# Patient Record
Sex: Male | Born: 1945 | Race: White | Hispanic: No | Marital: Single
Health system: Midwestern US, Community
[De-identification: ages and names within clinical notes are randomized; demographics above are authoritative.]

## PROBLEM LIST (undated history)

## (undated) DIAGNOSIS — Z139 Encounter for screening, unspecified: Secondary | ICD-10-CM

## (undated) DIAGNOSIS — N183 Chronic kidney disease, stage 3 unspecified: Secondary | ICD-10-CM

## (undated) DIAGNOSIS — I4891 Unspecified atrial fibrillation: Secondary | ICD-10-CM

## (undated) DIAGNOSIS — J45909 Unspecified asthma, uncomplicated: Secondary | ICD-10-CM

## (undated) DIAGNOSIS — I10 Essential (primary) hypertension: Secondary | ICD-10-CM

## (undated) DIAGNOSIS — D649 Anemia, unspecified: Secondary | ICD-10-CM

## (undated) DIAGNOSIS — I509 Heart failure, unspecified: Secondary | ICD-10-CM

## (undated) DIAGNOSIS — E119 Type 2 diabetes mellitus without complications: Secondary | ICD-10-CM

## (undated) DIAGNOSIS — G4730 Sleep apnea, unspecified: Secondary | ICD-10-CM

## (undated) HISTORY — PX: WOUND DEBRIDEMENT: SHX247

---

## 2010-10-26 ENCOUNTER — Encounter

## 2010-10-30 NOTE — Progress Notes (Signed)
I saw this patient on Friday, Feb 3    Pt c/o loose BMs for 2-3 days.  Some cramping, no n/v.  No blood in BMs .  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  Given negative CT, normal WBC and benign abdominal exam, I dont see surgical issues at this time.    To f/u with primary doctor

## 2014-06-16 NOTE — Telephone Encounter (Signed)
See dict at [facility]

## 2014-07-01 ENCOUNTER — Encounter: Attending: Neurological Surgery | Primary: Family Medicine

## 2014-07-06 LAB — BASIC METABOLIC PANEL
Anion Gap: 11 mEq/L (ref 7–13)
BUN: 16 mg/dL (ref 8–23)
CO2: 28 mEq/L (ref 22–29)
Calcium: 8.5 mg/dL — ABNORMAL LOW (ref 8.6–10.2)
Chloride: 101 mEq/L (ref 98–107)
Creatinine: 0.69 mg/dL — ABNORMAL LOW (ref 0.70–1.20)
GFR African American: >60 (ref >60)
GFR Non-African American: >60 (ref >60)
Glucose: 202 mg/dL — ABNORMAL HIGH (ref 74–109)
Potassium: 4.3 mEq/L (ref 3.5–5.1)
Sodium: 140 mEq/L (ref 132–144)

## 2014-07-12 LAB — HEMOGLOBIN A1C: Hemoglobin A1C: 8 % — ABNORMAL HIGH (ref 4.8–5.9)

## 2014-08-02 ENCOUNTER — Encounter: Payer: MEDICARE | Attending: Neurological Surgery | Primary: Family Medicine

## 2014-08-08 NOTE — Telephone Encounter (Signed)
Patient called office to find out when his next appointment was. Informed him we had received a notice on Televox he cx'd the 08-02-14 post op appt and we had l/m for him to call office to confirm he was cx'ing and to r/s the post op appt.  He cannot schedule an appointment at this time because he is going out of town this week until 08-29-14 and will be returning at that time for knee surgery.  He will contact our office when he is able to schedule an appointment.

## 2014-08-31 HISTORY — PX: JOINT REPLACEMENT: SHX530

## 2014-09-06 LAB — BASIC METABOLIC PANEL
Anion Gap: 16 mEq/L — ABNORMAL HIGH (ref 7–13)
BUN: 48 mg/dL — ABNORMAL HIGH (ref 8–23)
CO2: 26 mEq/L (ref 22–29)
Calcium: 8.2 mg/dL — ABNORMAL LOW (ref 8.6–10.2)
Chloride: 91 mEq/L — ABNORMAL LOW (ref 98–107)
Creatinine: 1.67 mg/dL — ABNORMAL HIGH (ref 0.70–1.20)
GFR African American: 49.7 — ABNORMAL LOW (ref >60)
GFR Non-African American: 41.1 — ABNORMAL LOW (ref >60)
Glucose: 66 mg/dL — ABNORMAL LOW (ref 74–109)
Potassium: 4.4 mEq/L (ref 3.5–5.1)
Sodium: 133 mEq/L (ref 132–144)

## 2014-09-08 LAB — COMPREHENSIVE METABOLIC PANEL
ALT: 135 U/L — ABNORMAL HIGH (ref 0–41)
AST: 43 U/L — ABNORMAL HIGH (ref 0–40)
Albumin: 2.9 g/dL — ABNORMAL LOW (ref 3.9–4.9)
Alkaline Phosphatase: 100 U/L (ref 35–104)
Anion Gap: 18 mEq/L — ABNORMAL HIGH (ref 7–13)
BUN: 57 mg/dL — ABNORMAL HIGH (ref 8–23)
CO2: 23 mEq/L (ref 22–29)
Calcium: 7.7 mg/dL — ABNORMAL LOW (ref 8.6–10.2)
Chloride: 86 mEq/L — ABNORMAL LOW (ref 98–107)
Creatinine: 1.57 mg/dL — ABNORMAL HIGH (ref 0.70–1.20)
GFR African American: 53.4 — ABNORMAL LOW (ref >60)
GFR Non-African American: 44.1 — ABNORMAL LOW (ref >60)
Globulin: 2.6 g/dL (ref 2.3–3.5)
Glucose: 210 mg/dL — ABNORMAL HIGH (ref 74–109)
Potassium: 4.4 mEq/L (ref 3.5–5.1)
Sodium: 127 mEq/L — ABNORMAL LOW (ref 132–144)
Total Bilirubin: 0.5 mg/dL (ref 0.0–1.2)
Total Protein: 5.5 g/dL — ABNORMAL LOW (ref 6.4–8.1)

## 2014-09-08 LAB — VITAMIN D 25 HYDROXY: Vit D, 25-Hydroxy: 28.8 ng/mL — ABNORMAL LOW (ref 30.0–100.0)

## 2014-09-08 LAB — LIPID PANEL
Cholesterol, Total: 91 mg/dL (ref 0–199)
HDL: 31 mg/dL — ABNORMAL LOW (ref 40–59)
LDL Calculated: 44 mg/dL (ref 0–129)
Triglycerides: 81 mg/dL (ref 0–200)

## 2014-09-08 LAB — CBC
Hematocrit: 31.5 % — ABNORMAL LOW (ref 42.0–52.0)
Hemoglobin: 10.2 g/dL — ABNORMAL LOW (ref 14.0–18.0)
MCH: 30.1 pg (ref 27.0–31.3)
MCHC: 32.5 % — ABNORMAL LOW (ref 33.0–37.0)
MCV: 92.6 fL (ref 80.0–100.0)
Platelets: 263 10*3/uL (ref 130–400)
RBC: 3.4 M/uL — ABNORMAL LOW (ref 4.70–6.10)
RDW: 14.5 % (ref 11.5–14.5)
WBC: 14.1 10*3/uL — ABNORMAL HIGH (ref 4.8–10.8)

## 2014-09-08 LAB — MAGNESIUM: Magnesium: 3 mg/dL — ABNORMAL HIGH (ref 1.7–2.3)

## 2014-09-08 LAB — TSH, HIGH SENSITIVE: TSH: 4.88 u[IU]/mL — ABNORMAL HIGH (ref 0.270–4.200)

## 2014-09-12 LAB — BASIC METABOLIC PANEL
Anion Gap: 11 mEq/L (ref 7–13)
BUN: 33 mg/dL — ABNORMAL HIGH (ref 8–23)
CO2: 26 mEq/L (ref 22–29)
Calcium: 7.9 mg/dL — ABNORMAL LOW (ref 8.6–10.2)
Chloride: 99 mEq/L (ref 98–107)
Creatinine: 1.1 mg/dL (ref 0.70–1.20)
GFR African American: >60 (ref >60)
GFR Non-African American: >60 (ref >60)
Glucose: 198 mg/dL — ABNORMAL HIGH (ref 74–109)
Potassium: 5.6 mEq/L — ABNORMAL HIGH (ref 3.5–5.1)
Sodium: 136 mEq/L (ref 132–144)

## 2014-09-12 LAB — CBC
Hematocrit: 31.8 % — ABNORMAL LOW (ref 42.0–52.0)
Hemoglobin: 10.2 g/dL — ABNORMAL LOW (ref 14.0–18.0)
MCH: 30.9 pg (ref 27.0–31.3)
MCHC: 32.1 % — ABNORMAL LOW (ref 33.0–37.0)
MCV: 96 fL (ref 80.0–100.0)
Platelets: 331 10*3/uL (ref 130–400)
RBC: 3.31 M/uL — ABNORMAL LOW (ref 4.70–6.10)
RDW: 14.5 % (ref 11.5–14.5)
WBC: 11.2 10*3/uL — ABNORMAL HIGH (ref 4.8–10.8)

## 2014-09-13 LAB — COMPREHENSIVE METABOLIC PANEL
ALT: 49 U/L — ABNORMAL HIGH (ref 0–41)
AST: 25 U/L (ref 0–40)
Albumin: 2.9 g/dL — ABNORMAL LOW (ref 3.9–4.9)
Alkaline Phosphatase: 79 U/L (ref 35–104)
Anion Gap: 9 mEq/L (ref 7–13)
BUN: 30 mg/dL — ABNORMAL HIGH (ref 8–23)
CO2: 26 mEq/L (ref 22–29)
Calcium: 7.9 mg/dL — ABNORMAL LOW (ref 8.6–10.2)
Chloride: 101 mEq/L (ref 98–107)
Creatinine: 1.19 mg/dL (ref 0.70–1.20)
GFR African American: >60 (ref >60)
GFR Non-African American: >60 (ref >60)
Globulin: 2.5 g/dL (ref 2.3–3.5)
Glucose: 265 mg/dL — ABNORMAL HIGH (ref 74–109)
Potassium: 5.7 mEq/L — ABNORMAL HIGH (ref 3.5–5.1)
Sodium: 136 mEq/L (ref 132–144)
Total Bilirubin: 0.4 mg/dL (ref 0.0–1.2)
Total Protein: 5.4 g/dL — ABNORMAL LOW (ref 6.4–8.1)

## 2014-09-19 LAB — BTNP: Pro-BNP: 1597 pg/mL

## 2014-10-06 ENCOUNTER — Encounter: Attending: Neurological Surgery | Primary: Family Medicine

## 2014-10-07 LAB — CBC WITH DIFFERENTIAL
Bands Relative: 1 %
Basophils %: 0 %
Basophils Absolute: 0 10*3/uL (ref 0.0–0.2)
Eosinophils %: 1 %
Eosinophils Absolute: 0.1 10*3/uL (ref 0.0–0.7)
Hematocrit: 29.8 % — ABNORMAL LOW (ref 42.0–52.0)
Hemoglobin: 9.4 g/dL — ABNORMAL LOW (ref 14.0–18.0)
Lymphocytes %: 17 %
Lymphocytes Absolute: 1.3 10*3/uL (ref 1.0–4.8)
MCH: 29.5 pg (ref 27.0–31.3)
MCHC: 31.7 % — ABNORMAL LOW (ref 33.0–37.0)
MCV: 93.1 fL (ref 80.0–100.0)
Monocytes %: 9 %
Monocytes Absolute: 0.7 10*3/uL (ref 0.2–0.8)
Myelocyte Percent: 3 %
Neutrophils %: 69 %
Neutrophils Absolute: 5.8 10*3/uL (ref 1.4–6.5)
PLATELET SLIDE REVIEW: ADEQUATE
Platelets: 191 10*3/uL (ref 130–400)
RBC: 3.2 M/uL — ABNORMAL LOW (ref 4.70–6.10)
RDW: 15.5 % — ABNORMAL HIGH (ref 11.5–14.5)
WBC: 7.9 10*3/uL (ref 4.8–10.8)

## 2014-10-07 LAB — COMPREHENSIVE METABOLIC PANEL
ALT: 16 U/L (ref 0–41)
AST: 18 U/L (ref 0–40)
Albumin: 3.3 g/dL — ABNORMAL LOW (ref 3.9–4.9)
Alkaline Phosphatase: 72 U/L (ref 35–104)
Anion Gap: 13 mEq/L (ref 7–13)
BUN: 30 mg/dL — ABNORMAL HIGH (ref 8–23)
CO2: 26 mEq/L (ref 22–29)
Calcium: 9 mg/dL (ref 8.6–10.2)
Chloride: 102 mEq/L (ref 98–107)
Creatinine: 1 mg/dL (ref 0.70–1.20)
GFR African American: >60 (ref >60)
GFR Non-African American: >60 (ref >60)
Globulin: 2.5 g/dL (ref 2.3–3.5)
Glucose: 240 mg/dL — ABNORMAL HIGH (ref 74–109)
Potassium: 5.1 mEq/L (ref 3.5–5.1)
Sodium: 141 mEq/L (ref 132–144)
Total Bilirubin: 0.3 mg/dL (ref 0.0–1.2)
Total Protein: 5.8 g/dL — ABNORMAL LOW (ref 6.4–8.1)

## 2014-10-15 LAB — TROPONIN: Troponin: 0.025 ng/mL (ref 0.000–0.010)

## 2014-10-15 LAB — CREATINE KINASE: Total CK: 128 U/L (ref 0–190)

## 2014-10-16 LAB — URINALYSIS
Bilirubin Urine: NEGATIVE
Blood, Urine: NEGATIVE
Glucose, Ur: NEGATIVE mg/dL
Ketones, Urine: NEGATIVE mg/dL
Leukocyte Esterase, Urine: NEGATIVE
Nitrite, Urine: NEGATIVE
Protein, UA: NEGATIVE mg/dL
Specific Gravity, UA: 1.012 (ref 1.005–1.030)
Urobilinogen, Urine: 0.2 E.U./dL (ref <2.0)
pH, UA: 5 (ref 5.0–9.0)

## 2014-11-08 ENCOUNTER — Encounter (HOSPITAL_COMMUNITY): Payer: Self-pay | Admitting: Emergency Medicine

## 2014-11-08 ENCOUNTER — Inpatient Hospital Stay (HOSPITAL_COMMUNITY)
Admission: EM | Admit: 2014-11-08 | Discharge: 2014-11-11 | DRG: 291 | Disposition: A | Discharge status: Home Health | Payer: Medicare Other | Attending: Internal Medicine | Admitting: Internal Medicine

## 2014-11-08 ENCOUNTER — Institutional Professional Consult (permissible substitution): Payer: Self-pay | Admitting: Internal Medicine

## 2014-11-08 ENCOUNTER — Emergency Department (HOSPITAL_COMMUNITY): Payer: Medicare Other

## 2014-11-08 DIAGNOSIS — R0602 Shortness of breath: Secondary | ICD-10-CM | POA: Diagnosis not present

## 2014-11-08 DIAGNOSIS — I4891 Unspecified atrial fibrillation: Secondary | ICD-10-CM | POA: Diagnosis present

## 2014-11-08 DIAGNOSIS — E119 Type 2 diabetes mellitus without complications: Secondary | ICD-10-CM | POA: Diagnosis present

## 2014-11-08 DIAGNOSIS — Z794 Long term (current) use of insulin: Secondary | ICD-10-CM

## 2014-11-08 DIAGNOSIS — Z6841 Body Mass Index (BMI) 40.0 and over, adult: Secondary | ICD-10-CM

## 2014-11-08 DIAGNOSIS — L8990 Pressure ulcer of unspecified site, unspecified stage: Secondary | ICD-10-CM | POA: Diagnosis present

## 2014-11-08 DIAGNOSIS — I5033 Acute on chronic diastolic (congestive) heart failure: Secondary | ICD-10-CM | POA: Diagnosis not present

## 2014-11-08 DIAGNOSIS — R0902 Hypoxemia: Secondary | ICD-10-CM | POA: Diagnosis present

## 2014-11-08 DIAGNOSIS — G4730 Sleep apnea, unspecified: Secondary | ICD-10-CM | POA: Diagnosis present

## 2014-11-08 DIAGNOSIS — E118 Type 2 diabetes mellitus with unspecified complications: Secondary | ICD-10-CM

## 2014-11-08 DIAGNOSIS — L89152 Pressure ulcer of sacral region, stage 2: Secondary | ICD-10-CM | POA: Diagnosis present

## 2014-11-08 DIAGNOSIS — Z955 Presence of coronary angioplasty implant and graft: Secondary | ICD-10-CM

## 2014-11-08 DIAGNOSIS — E1129 Type 2 diabetes mellitus with other diabetic kidney complication: Secondary | ICD-10-CM

## 2014-11-08 DIAGNOSIS — I10 Essential (primary) hypertension: Secondary | ICD-10-CM | POA: Diagnosis present

## 2014-11-08 DIAGNOSIS — I4892 Unspecified atrial flutter: Secondary | ICD-10-CM | POA: Diagnosis present

## 2014-11-08 DIAGNOSIS — E785 Hyperlipidemia, unspecified: Secondary | ICD-10-CM | POA: Diagnosis present

## 2014-11-08 DIAGNOSIS — J9601 Acute respiratory failure with hypoxia: Secondary | ICD-10-CM | POA: Diagnosis present

## 2014-11-08 DIAGNOSIS — D649 Anemia, unspecified: Secondary | ICD-10-CM | POA: Diagnosis present

## 2014-11-08 DIAGNOSIS — I2510 Atherosclerotic heart disease of native coronary artery without angina pectoris: Secondary | ICD-10-CM | POA: Diagnosis present

## 2014-11-08 DIAGNOSIS — J45909 Unspecified asthma, uncomplicated: Secondary | ICD-10-CM | POA: Diagnosis present

## 2014-11-08 HISTORY — DX: Unspecified atrial fibrillation: I48.91

## 2014-11-08 HISTORY — DX: Heart failure, unspecified: I50.9

## 2014-11-08 HISTORY — DX: Essential (primary) hypertension: I10

## 2014-11-08 HISTORY — DX: Sleep apnea, unspecified: G47.30

## 2014-11-08 HISTORY — DX: Unspecified asthma, uncomplicated: J45.909

## 2014-11-08 HISTORY — DX: Type 2 diabetes mellitus without complications: E11.9

## 2014-11-08 LAB — BASIC METABOLIC PANEL
Anion gap: 6 (ref 5–15)
BUN: 26 mg/dL — AB (ref 6–23)
CHLORIDE: 104 mmol/L (ref 96–112)
CO2: 34 mmol/L — AB (ref 19–32)
Calcium: 8.8 mg/dL (ref 8.4–10.5)
Creatinine, Ser: 1.21 mg/dL (ref 0.50–1.35)
GFR calc Af Amer: 69 mL/min — ABNORMAL LOW (ref >90)
GFR calc non Af Amer: 60 mL/min — ABNORMAL LOW (ref >90)
GLUCOSE: 130 mg/dL — AB (ref 70–99)
POTASSIUM: 4.4 mmol/L (ref 3.5–5.1)
Sodium: 144 mmol/L (ref 135–145)

## 2014-11-08 LAB — CBC
HCT: 32.6 % — ABNORMAL LOW (ref 39.0–52.0)
Hemoglobin: 9.7 g/dL — ABNORMAL LOW (ref 13.0–17.0)
MCH: 29 pg (ref 26.0–34.0)
MCHC: 29.8 g/dL — ABNORMAL LOW (ref 30.0–36.0)
MCV: 97.3 fL (ref 78.0–100.0)
PLATELETS: 155 10*3/uL (ref 150–400)
RBC: 3.35 MIL/uL — ABNORMAL LOW (ref 4.22–5.81)
RDW: 15 % (ref 11.5–15.5)
WBC: 7.6 10*3/uL (ref 4.0–10.5)

## 2014-11-08 LAB — BRAIN NATRIURETIC PEPTIDE: B NATRIURETIC PEPTIDE 5: 300.9 pg/mL — AB (ref 0.0–100.0)

## 2014-11-08 LAB — TROPONIN I: Troponin I: 0.03 ng/mL (ref <0.031)

## 2014-11-08 LAB — GLUCOSE, CAPILLARY
Glucose-Capillary: 117 mg/dL — ABNORMAL HIGH (ref 70–99)
Glucose-Capillary: 224 mg/dL — ABNORMAL HIGH (ref 70–99)

## 2014-11-08 LAB — I-STAT TROPONIN, ED: TROPONIN I, POC: 0.02 ng/mL (ref 0.00–0.08)

## 2014-11-08 MED ORDER — FUROSEMIDE 10 MG/ML IJ SOLN
40.0000 mg | Freq: Once | INTRAMUSCULAR | Status: AC
  Administered 2014-11-08: 40 mg via INTRAVENOUS
  Filled 2014-11-08: qty 4

## 2014-11-08 MED ORDER — INSULIN ASPART 100 UNIT/ML ~~LOC~~ SOLN
0.0000 [IU] | Freq: Three times a day (TID) | SUBCUTANEOUS | Status: DC
  Administered 2014-11-09: 2 [IU] via SUBCUTANEOUS
  Administered 2014-11-09: 7 [IU] via SUBCUTANEOUS

## 2014-11-08 MED ORDER — SODIUM CHLORIDE 0.9 % IJ SOLN
3.0000 mL | Freq: Two times a day (BID) | INTRAMUSCULAR | Status: DC
  Administered 2014-11-08 – 2014-11-10 (×5): 3 mL via INTRAVENOUS

## 2014-11-08 MED ORDER — INSULIN ASPART 100 UNIT/ML ~~LOC~~ SOLN
0.0000 [IU] | Freq: Every day | SUBCUTANEOUS | Status: DC
  Administered 2014-11-08: 2 [IU] via SUBCUTANEOUS

## 2014-11-08 MED ORDER — IOHEXOL 350 MG/ML SOLN
100.0000 mL | Freq: Once | INTRAVENOUS | Status: AC | PRN
  Administered 2014-11-08: 100 mL via INTRAVENOUS

## 2014-11-08 MED ORDER — RIVAROXABAN 20 MG PO TABS
20.0000 mg | ORAL_TABLET | Freq: Every day | ORAL | Status: DC
  Administered 2014-11-08 – 2014-11-10 (×3): 20 mg via ORAL
  Filled 2014-11-08 (×4): qty 1

## 2014-11-08 NOTE — Progress Notes (Addendum)
[id number] male c/o sob, wound check CM consulted by ED unit secretary about Cm consult to assist with home health services.  CM noted pt without any insurance listed in EPIC.  CM went to speak with pt but he was being seen by ED CNA (pt would not speak with CM at this time), then by ED RN for IV insertion.   1230 In between of CM attempting to see pt CM was called by Mayra ReelLecretia of [facility] stating Pt's niece, Mark Roberson [phone]) works for [facility] and request pt be qualified for oxygen Reports he came form [place] on Saturday, 11/05/14 to live with her, kelly and she noted decrease in sats on last pm.  CM shared with [place] that [facility] sats [facility] has been 94-95%. Tresa EndoKelly provided insurance information see below. CM discussed order for home health services  1240 Pt confirmed his medicare coverage and states he does not have pcp because he just got in [place] today.  Informed him of call from [facility] with information from his niece.  Gave Cm permission to have niece assist him with medical care, setting up home services, choice of home health agency and choice of new [facility] pcp.    1242 shared number for niece with ED RN Discussed niece request for Oxygen, encouraged ambulation to check for drop in saturation  1246 called ED registration to provide ID numbers offered by niece as mcr [id number] A & Susa SimmondsARP [id number] spoke with niece states will need a cv for f/u, Has a wound opened this morning Wants wound checked in hospital Reports in January 2016 in [place] potassium was increased in SNF and he was on Oxygen 4 L [place] in snf and during last [place] hospitalization "at night". Reports pt had swelling in all extremities after drive from [place] to  and pt on lasix. Feels pt needs a nebulizer Instructed Tresa EndoKelly how Cm would obtain a list of medicare providers via medicare.gov for pt and place list in a personal belonging bag for him & her Discussed need for pcp for home health orders and f/u care  Provided cm mobile to  niece for further questions

## 2014-11-08 NOTE — ED Notes (Signed)
PT to CT at this time.

## 2014-11-08 NOTE — ED Notes (Signed)
Attempted IV access, Unsuccessful.

## 2014-11-08 NOTE — ED Notes (Signed)
MD at bedside at this time.

## 2014-11-08 NOTE — ED Notes (Signed)
Bed: WA19 Expected date:  Expected time:  Means of arrival:  Comments: EMS 

## 2014-11-08 NOTE — Progress Notes (Signed)
MD made aware of 5 beat run of [place].

## 2014-11-08 NOTE — ED Notes (Signed)
Attempted to call report to 5E. Secretary states RN will return call.

## 2014-11-08 NOTE — ED Provider Notes (Signed)
CSN: [id number]     Arrival date & time 11/08/14  1115 History   First MD Initiated Contact with Patient 11/08/14 1119     Chief Complaint  Patient presents with  . Shortness of Breath  . Wound Check     (Consider location/radiation/quality/duration/timing/severity/associated sxs/prior Treatment) HPI Comments: Patient checked his O2 sat monitor last night while coughing and having difficulty breathing it read in the 70s. He began coughing and breathing more and improved to the 90s without oxygen. He reports a recent stay in the hospital where he was hypoxic but never qualified for home O2 therapy per the hospital reports. He has a home O2 sat monitor and does not currently have abduction at home. He wants option therapy for home use today. He denies chest pain, shortness of breath, hemoptysis, fever at this time. He was short of breath last night when his sat was low but after he coughed and his sat improved he was doing fine.  He also reports a buttock wound. He had a pressure sore appear during rehabilitation and has had it debrided by wound center. During an 8 hour car ride down here to his new residence he felt the ulcer pop open.  He recently was in [place] at [facility] for hyperkalemia, troponin issues, heart failure. I cannot find a formal discharge summary.  Patient is a 69 y.o. male presenting with shortness of breath and wound check. The history is provided by the patient.  Shortness of Breath Severity:  Mild Onset quality:  Gradual Timing:  Sporadic Progression:  Unchanged Chronicity:  Recurrent Context: not URI   Context comment:  While sleeping Relieved by: coughing and breathing. Exacerbated by: sleeping. Associated symptoms: no abdominal pain, no chest pain, no fever, no rash, no sore throat and no vomiting   Risk factors: obesity (morbid) and recent surgery (recent TKA 2 months ago)   Risk factors: no hx of PE/DVT   Wound Check Associated symptoms include shortness of  breath. Pertinent negatives include no chest pain and no abdominal pain.    Past Medical History  Diagnosis Date  . CHF (congestive heart failure)   . Atrial fibrillation   . Diabetes mellitus without complication   . Hypertension   . Sleep apnea   . Asthma    Past Surgical History  Procedure Laterality Date  . 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 Wound debridement Right    History reviewed. No pertinent family history. History  Substance Use Topics  . Smoking status: Never Smoker   . Smokeless tobacco: Never Used  . Alcohol Use: Yes     Comment: rare    Review of Systems  Constitutional: Negative for fever.  HENT: Negative for sore throat.   Respiratory: Positive for shortness of breath.   Cardiovascular: Negative for chest pain.  Gastrointestinal: Negative for vomiting and abdominal pain.  Skin: Negative for rash.  All other systems reviewed and are negative.     Allergies  Percocet and Penicillins  Home Medications   Prior to Admission medications   Not on File   BP 111/51 mmHg  Pulse 75  Temp(Src) 98.1 F (36.7 C) (Oral)  Resp 20  Ht 6' (1.829 m)  Wt 338 lb (153.316 kg)  BMI 45.83 kg/m2  SpO2 94% Physical Exam  Constitutional: He is oriented to person, place, and time. He appears well-developed and well-nourished. No distress.  HENT:  Head: Normocephalic and atraumatic.  Mouth/Throat: No oropharyngeal exudate.  Eyes:  EOM are normal. Pupils are equal, round, and reactive to light.  Neck: Normal range of motion. Neck supple.  Cardiovascular: Normal rate and regular rhythm.  Exam reveals no friction rub.   No murmur heard. Pulmonary/Chest: Effort normal and breath sounds normal. No respiratory distress. He has no wheezes. He has no rales.  Abdominal: Soft. He exhibits no distension. There is no tenderness. There is no rebound.  Genitourinary:     Musculoskeletal: Normal range of motion. He exhibits edema (Non-pitting, , bilateral, 3+).    Neurological: He is alert and oriented to person, place, and time.  Skin: He is not diaphoretic.  Nursing note and vitals reviewed.   ED Course  Procedures (including critical care time) Labs Review Labs Reviewed  CBC  BASIC METABOLIC PANEL  BRAIN NATRIURETIC PEPTIDE  I-STAT TROPOININ, ED    Imaging Review Dg Chest 2 View  11/08/2014   CLINICAL DATA:  Congestive heart failure, recent left knee replacement  EXAM: CHEST  2 VIEW  COMPARISON:  None.  FINDINGS: Cardiomegaly is noted. Central mild vascular congestion without pulmonary edema. No segmental infiltrate. There is elevation of the right hemidiaphragm with right basilar atelectasis. Osteopenia and mild degenerative changes thoracic spine.  IMPRESSION: Cardiomegaly. Central mild vascular congestion without pulmonary edema. Elevation of the right hemidiaphragm with right basilar atelectasis. No segmental infiltrate.   Electronically Signed   By: Natasha Mead M.D.   On: 11/08/2014 13:34   Ct Angio Chest Pe W/cm &/or Wo Cm  11/08/2014   CLINICAL DATA:  69 year old male with shortness of breath and recent left knee replacement. Query pulmonary embolus. Initial encounter.  EXAM: CT ANGIOGRAPHY CHEST WITH CONTRAST  TECHNIQUE: Multidetector CT imaging of the chest was performed using the standard protocol during bolus administration of intravenous contrast. Multiplanar CT image reconstructions and MIPs were obtained to evaluate the vascular anatomy.  CONTRAST:  OMNIPAQUE IOHEXOL 350 MG/ML SOLN  COMPARISON:  Chest radiographs 1302 hours today.  FINDINGS: Suboptimal contrast bolus timing in the pulmonary arterial tree. No pulmonary artery filling defect identified.  Cardiomegaly. No pericardial or pleural effusion. Calcified atherosclerosis of the aorta and its branches, including the coronary arteries. Subcentimeter mediastinal and hilar lymph nodes. Among the largest nodes are those at the prevascular station measuring up to 9 mm short axis. No  axillary lymphadenopathy. Negative thoracic inlet.  Atelectatic changes to the major airways bilaterally. Ground-glass opacity in the upper lobes with mosaic attenuation pattern. Less pronounced similar changes in both lower lobes. Mild superimposed lower lobe atelectasis. No consolidation.  No acute osseous abnormality identified.  Negative visualized liver, gallbladder, spleen and bowel in the upper abdomen.  Review of the MIP images confirms the above findings.  IMPRESSION: 1. Suboptimal pulmonary artery contrast bolus, but no evidence of acute pulmonary embolus. 2. Cardiomegaly. Calcified atherosclerosis of the aorta and coronary arteries. 3. Atelectasis and mosaic attenuation in both lungs. Consider small airway disease, air trapping, chronic vascular disease.   Electronically Signed   By: Odessa Fleming M.D.   On: 11/08/2014 14:25     EKG Interpretation   Date/Time:  Tuesday November 08 2014 12:20:14 EST Ventricular Rate:  62 PR Interval:    QRS Duration: 90 QT Interval:  431 QTC Calculation: 438 R Axis:   -29 Text Interpretation:  Atrial flutter Borderline left axis deviation No  prior for comparison Confirmed by Gwendolyn Grant  MD, Brezlyn Manrique ([id number]) on 11/08/2014  3:54:01 PM      MDM   Final diagnoses:  Shortness of  breath  Hypoxia    69 year old male here for hypoxia at night and a wound ulcer. Hypoxia: Was hypoxic Hx his sats last night. He is doing well here and not hypoxic. I suspect is likely because he is morbidly obese and has sleep apnea. He doesn't have a formal diagnosis of sleep apnea per his outside records. He said he is not qualified for oxygen in the past but has had issues with hypoxia. He recently was in the hospital for a knee replacement, will check a PE scan.  Wound ulcer: Recently debrided at an outside wound center. He felt it reopened after the car ride here. On appearance here is not infected in the ulcer pill appears well. Is located in the right buttock inside the gluteal  cleft. It is not perianal. Will refer him to wound care.  Hypoxic here, PE scan negative. Unable to obtain oxygen out of the ER. Admitted for his hypoxia.  Elwin Mocha, MD 11/08/14 [phone]

## 2014-11-08 NOTE — Progress Notes (Addendum)
1558 spoke with kelly about pt admission or d/c with paying co pay for oxygen at home Pt agrees to admission as observation Wants wound care referral.  Cm spoke with Mayra ReelLecretia and Baxter HireKristen about DME & home health services Orders entered in EPIC  1500 spoke with Dr Gwendolyn GrantWalden about pt need for home oxygen. [facility],  RA 87% at rest in bed. Spoke with [facility] DME staff, Mayra ReelLecretia   [phone] Tresa EndoKelly called to state she has obtained pt an appointment at [facility] with Dr Niel HummerElizabeth Collier on November 14 2014 at 4 pm

## 2014-11-08 NOTE — H&P (Signed)
History and Physical    Mark GoldsMichael Roberson ZOX:[id number] DOB: [birth year] DOA: 11/08/2014  Referring physician: Dr. Gwendolyn GrantWalden PCP: Judie BonusKollar, Elizabeth A, MD  Specialists: none  Chief Complaint: shortness of breath   HPI: Mark GoldsMichael Roberson is a 69 y.o. male has a past medical history significant for coronary artery disease status post stent placement,  Insulin-dependent diabetes mellitus , hypertension, hyperlipidemia, sleep apnea , presents to the emergency room with a chief complaint of hypoxia. [place]. [facility] hyper[facility]emia, elevated troponin, apparently was in A. Fib and patient tells me that he had a TEE and he was converted back to sinus rhythm. He was discharged to a rehabilitation facility, where he spent about couple weeks, and he tells me that he was not very happy there as they did not monitor him accurately , and he tells me that  He has gained several pounds during his stay and he became progressively short of breath. He was discharged from the rehabilitation facility on Saturday morning , and at that time he decided to drive to [place] and he stays here with his friends. Over the last couple days, patient has continued to feel dyspneic with exertion, he recently bought a pulse oximeter and he noticed that his oxygenation levels in the 70s. He currently denies chest pain, but had a little bit earlier, he denies any shortness of breath , denies any fever or chills. He denies any abdominal pain, nausea, vomiting or diarrhea. He also endorses a buttock wound which was healing well and he received one care in the rehabilitation facility, however he feels like it opened up during the drive down here.  In the emergency room,  Patient was found mildly anemic with a hemoglobin of 9.7, elevated BNP at 300, borderline renal function with a BUN of 26 and creatinine 1.21 , he was hypoxic on room air and needed  oxygen. He underwent a CT angiogram of the chest which showed no evidence of PE. Chest x-ray showed pulmonary vascular congestion and cardiomegaly. EKG shows a flutter. TRH was asked for admission for hypoxic respiratory failure.  Review of Systems:  As per history of present illness, otherwise 10 point review of systems negative  Past Medical History  Diagnosis Date  . CHF (congestive heart failure)   . Atrial fibrillation   . Diabetes mellitus without complication   . Hypertension   . Sleep apnea   . Asthma    Past Surgical History  Procedure Laterality Date  . 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 Wound debridement Right    Social History:  reports that he has never smoked. He has never used smokeless tobacco. He reports that he drinks alcohol. He reports that he does not use illicit drugs.  Allergies  Allergen Reactions  . Percocet [Oxycodone-Acetaminophen] Other (See Comments)    hallucination  . Penicillins Hives   History reviewed. No pertinent family history.  Prior to Admission medications   Medication Sig Start Date End Date Taking? Authorizing Provider  albuterol (PROVENTIL HFA;VENTOLIN HFA) 108 (90 BASE) MCG/ACT inhaler Inhale 2 puffs into the lungs every 6 (six) hours as needed for shortness of breath (shortness of breath).   Yes Historical Provider, MD  atorvastatin (LIPITOR) 20 MG tablet Take 20 mg by mouth daily.   Yes Historical Provider, MD  carvedilol (COREG) 6.25 MG tablet Take 6.25 mg by mouth 2 (two) times daily with a  meal.   Yes Historical Provider, MD  Cholecalciferol (VITAMIN D3) 2000 UNITS TABS Take 1 tablet by mouth daily.   Yes Historical Provider, MD  diclofenac (VOLTAREN) 75 MG EC tablet Take 75 mg by mouth 2 (two) times daily.   Yes Historical Provider, MD  ferrous sulfate 300 (60 FE) MG/5ML syrup Take 300 mg by mouth 3 (three) times daily.   Yes Historical Provider, MD  gabapentin (NEURONTIN) 300 MG capsule Take 300 mg by mouth 3 (three) times  daily.   Yes Historical Provider, MD  insulin glargine (LANTUS) 100 UNIT/ML injection Inject 66 Units into the skin at bedtime.    Yes Historical Provider, MD  insulin lispro (HUMALOG) 100 UNIT/ML KiwkPen Inject 10-15 Units into the skin 2 (two) times daily. Takes 10 units with lunch and 15 units with supper, then sliding scale at bedtime.   Yes Historical Provider, MD  magnesium oxide (MAG-OX) 400 MG tablet Take 400 mg by mouth at bedtime.   Yes Historical Provider, MD  NYSTATIN PO Take 5 mLs by mouth 4 (four) times daily. For 1 Week.   Yes Historical Provider, MD  omega-3 acid ethyl esters (LOVAZA) 1 G capsule Take 2 g by mouth at bedtime.   Yes Historical Provider, MD  rivaroxaban (XARELTO) 20 MG TABS tablet Take 20 mg by mouth daily with supper.   Yes Historical Provider, MD  sitaGLIPtin (JANUVIA) 100 MG tablet Take 100 mg by mouth daily.   Yes Historical Provider, MD  sodium chloride (OCEAN) 0.65 % SOLN nasal spray Place 2 sprays into both nostrils daily as needed for congestion (congestion).   Yes Historical Provider, MD  torsemide (DEMADEX) 20 MG tablet Take 20 mg by mouth daily.   Yes Historical Provider, MD  lisinopril (PRINIVIL,ZESTRIL) 10 MG tablet Take 10 mg by mouth daily.    Historical Provider, MD   Physical Exam: Filed Vitals:   11/08/14 1132 11/08/14 1159 11/08/14 1331 11/08/14 1614  BP: 111/51  134/88 132/53  Pulse: 75 63 64 63  Temp: 98.1 F (36.7 C)     TempSrc: Oral     Resp: Height: 6' (1.829 m)     Weight: 153.316 kg (338 lb)     SpO2: 94% 87% 100% 100%      General:   He is in no apparent distress, morbidly obese Caucasian male  Eyes: PERRL, EOMI, no scleral icterus  ENT: moist oropharynx  Neck:  No cervical lymphadenopathy  Cardiovascular:  Irregularly irregular, no murmurs rubs or gallops, 2+ peripheral pulses, 1-2+ lower extremity edema , could not appreciate JVD due to thick neck  Respiratory:  No wheezing, moves air well, bibasilar  crackles  Abdomen:  Obese, nontender , distant bowel sounds  Skin: no rashes  Musculoskeletal: normal bulk and tone, no joint swelling,  Left buttock wound with clean dressing, no purulent discharge  Psychiatric: normal mood and affect  Neurologic:  Grossly nonfocal   Labs on Admission:  Basic Metabolic Panel:  Recent Labs Lab 11/08/14 1236  NA 144  K 4.4  CL 104  CO2 34*  GLUCOSE 130*  BUN 26*  CREATININE 1.21  CALCIUM 8.8   CBC:  Recent Labs Lab 11/08/14 1236  WBC 7.6  HGB 9.7*  HCT 32.6*  MCV 97.3  PLT 155   BNP (last 3 results)  Recent Labs  11/08/14 1236  BNP 300.9*    Radiological Exams on Admission: Dg Chest 2 View  11/08/2014   CLINICAL DATA:  Congestive heart failure, recent left knee replacement  EXAM: CHEST  2 VIEW  COMPARISON:  None.  FINDINGS: Cardiomegaly is noted. Central mild vascular congestion without pulmonary edema. No segmental infiltrate. There is elevation of the right hemidiaphragm with right basilar atelectasis. Osteopenia and mild degenerative changes thoracic spine.  IMPRESSION: Cardiomegaly. Central mild vascular congestion without pulmonary edema. Elevation of the right hemidiaphragm with right basilar atelectasis. No segmental infiltrate.   Electronically Signed   By: Natasha Mead M.D.   On: 11/08/2014 13:34   Ct Angio Chest Pe W/cm &/or Wo Cm  11/08/2014   CLINICAL DATA:  69 year old male with shortness of breath and recent left knee replacement. Query pulmonary embolus. Initial encounter.  EXAM: CT ANGIOGRAPHY CHEST WITH CONTRAST  TECHNIQUE: Multidetector CT imaging of the chest was performed using the standard protocol during bolus administration of intravenous contrast. Multiplanar CT image reconstructions and MIPs were obtained to evaluate the vascular anatomy.  CONTRAST:  OMNIPAQUE IOHEXOL 350 MG/ML SOLN  COMPARISON:  Chest radiographs 1302 hours today.  FINDINGS: Suboptimal contrast bolus timing in the pulmonary arterial  tree. No pulmonary artery filling defect identified.  Cardiomegaly. No pericardial or pleural effusion. Calcified atherosclerosis of the aorta and its branches, including the coronary arteries. Subcentimeter mediastinal and hilar lymph nodes. Among the largest nodes are those at the prevascular station measuring up to 9 mm short axis. No axillary lymphadenopathy. Negative thoracic inlet.  Atelectatic changes to the major airways bilaterally. Ground-glass opacity in the upper lobes with mosaic attenuation pattern. Less pronounced similar changes in both lower lobes. Mild superimposed lower lobe atelectasis. No consolidation.  No acute osseous abnormality identified.  Negative visualized liver, gallbladder, spleen and bowel in the upper abdomen.  Review of the MIP images confirms the above findings.  IMPRESSION: 1. Suboptimal pulmonary artery contrast bolus, but no evidence of acute pulmonary embolus. 2. Cardiomegaly. Calcified atherosclerosis of the aorta and coronary arteries. 3. Atelectasis and mosaic attenuation in both lungs. Consider small airway disease, air trapping, chronic vascular disease.   Electronically Signed   By: Odessa Fleming M.D.   On: 11/08/2014 14:25    EKG: Independently reviewed. A flutter  Assessment/Plan Active Problems:   Shortness of breath   Hypoxia   Morbid obesity   Decubitus ulcer   HTN (hypertension)   Atrial flutter   DM (diabetes mellitus)    Acute hypoxic respiratory failure -  There may be a heart failure component with an elevated BNP and a chest x-ray shows pulmonary vascular congestion. Lasix 40 mg 1. -  Obtain a 2-D echo -  He is on anticoagulations so doubt PE  ? CHF -  As per his diagnosis list in [place], obtain a 2-D echo, he definitely looks fluid overloaded with lower extremity edema as well as basilar crackles - Lasix 40 mg x 1 - Strict I's and O's - Daily weights - Chest tightness with dyspnea,  We'll check troponins  Atrial flutter -  Patient  currently is in a flutter in the ED - Continue Xarelto - per patient recently cardioverted from A fib - obtain 2D echo - he has a history of type II block as well, he is not on any beta blockers or calcium channel blockers, his a flutter is rate controlled with heart rate of 60. Closely monitor on telemetry.  Hypertension -  Resume his lisinopril , Lasix  Insulin-dependent diabetes -   I'm seeing his most recent A1c in [place] for 7.6 on November 2015 improved  from 9.1 -  Continue Lantus and sliding scale insulin  Morbid obesity  Anemia - hb on presentation 9.7, similar to 10/07/14 when it was 9.4 - monitor   His medical record as per care everywhere is quite extensive , however on having hard time finding the most recent discharge summary    Diet: heart healthy/carb modified Fluids: none DVT Prophylaxis: Xarelto  Code Status: Full  Family Communication: d/w patient  Disposition Plan:  Admit to telemetry  Time spent: 81  Arli Bree M. Elvera Lennox, MD [facility] Pager [phone]  If 7PM-7AM, please contact night-coverage [email or web address] Password [facility] 11/08/2014, 4:35 PM

## 2014-11-08 NOTE — ED Notes (Signed)
Pt noted to have desaturation to 87% on RA, [place] 2L applied and pt returned to 95%

## 2014-11-08 NOTE — ED Notes (Signed)
Pt BIB [facility], recently drove from [place] where he was in a rehab facility after having a L knee replacement, and was dx with CHF after gaining 40lb of fluid. Pt states that he has had drops in his oxygen saturation at night, as low as 74% that will wake him, and feels ShOB upon exertion. Pt states that he has been told to sleep on a pillow at night, but is unable to do so. Pt also has a wound to his R buttock that had been healing well, but opened up again on his drive down from [place].

## 2014-11-09 DIAGNOSIS — Z955 Presence of coronary angioplasty implant and graft: Secondary | ICD-10-CM | POA: Diagnosis not present

## 2014-11-09 DIAGNOSIS — Z794 Long term (current) use of insulin: Secondary | ICD-10-CM | POA: Diagnosis not present

## 2014-11-09 DIAGNOSIS — G4730 Sleep apnea, unspecified: Secondary | ICD-10-CM | POA: Diagnosis present

## 2014-11-09 DIAGNOSIS — I5033 Acute on chronic diastolic (congestive) heart failure: Secondary | ICD-10-CM | POA: Diagnosis present

## 2014-11-09 DIAGNOSIS — R0602 Shortness of breath: Secondary | ICD-10-CM | POA: Diagnosis present

## 2014-11-09 DIAGNOSIS — I2510 Atherosclerotic heart disease of native coronary artery without angina pectoris: Secondary | ICD-10-CM | POA: Diagnosis present

## 2014-11-09 DIAGNOSIS — I10 Essential (primary) hypertension: Secondary | ICD-10-CM

## 2014-11-09 DIAGNOSIS — I4892 Unspecified atrial flutter: Secondary | ICD-10-CM

## 2014-11-09 DIAGNOSIS — J9601 Acute respiratory failure with hypoxia: Secondary | ICD-10-CM | POA: Diagnosis present

## 2014-11-09 DIAGNOSIS — I4891 Unspecified atrial fibrillation: Secondary | ICD-10-CM | POA: Diagnosis present

## 2014-11-09 DIAGNOSIS — L89152 Pressure ulcer of sacral region, stage 2: Secondary | ICD-10-CM | POA: Diagnosis present

## 2014-11-09 DIAGNOSIS — Z6841 Body Mass Index (BMI) 40.0 and over, adult: Secondary | ICD-10-CM | POA: Diagnosis not present

## 2014-11-09 DIAGNOSIS — E785 Hyperlipidemia, unspecified: Secondary | ICD-10-CM | POA: Diagnosis present

## 2014-11-09 DIAGNOSIS — J45909 Unspecified asthma, uncomplicated: Secondary | ICD-10-CM | POA: Diagnosis present

## 2014-11-09 DIAGNOSIS — E119 Type 2 diabetes mellitus without complications: Secondary | ICD-10-CM | POA: Diagnosis present

## 2014-11-09 DIAGNOSIS — E1069 Type 1 diabetes mellitus with other specified complication: Secondary | ICD-10-CM

## 2014-11-09 DIAGNOSIS — D649 Anemia, unspecified: Secondary | ICD-10-CM | POA: Diagnosis present

## 2014-11-09 LAB — CBC
HCT: 33.3 % — ABNORMAL LOW (ref 39.0–52.0)
Hemoglobin: 9.8 g/dL — ABNORMAL LOW (ref 13.0–17.0)
MCH: 28.7 pg (ref 26.0–34.0)
MCHC: 29.4 g/dL — ABNORMAL LOW (ref 30.0–36.0)
MCV: 97.4 fL (ref 78.0–100.0)
PLATELETS: 160 10*3/uL (ref 150–400)
RBC: 3.42 MIL/uL — ABNORMAL LOW (ref 4.22–5.81)
RDW: 14.8 % (ref 11.5–15.5)
WBC: 7.3 10*3/uL (ref 4.0–10.5)

## 2014-11-09 LAB — COMPREHENSIVE METABOLIC PANEL
ALBUMIN: 3.1 g/dL — AB (ref 3.5–5.2)
ALK PHOS: 57 U/L (ref 39–117)
ALT: 18 U/L (ref 0–53)
AST: 22 U/L (ref 0–37)
Anion gap: 10 (ref 5–15)
BILIRUBIN TOTAL: 0.8 mg/dL (ref 0.3–1.2)
BUN: 22 mg/dL (ref 6–23)
CHLORIDE: 99 mmol/L (ref 96–112)
CO2: 33 mmol/L — ABNORMAL HIGH (ref 19–32)
Calcium: 9.1 mg/dL (ref 8.4–10.5)
Creatinine, Ser: 1.1 mg/dL (ref 0.50–1.35)
GFR calc Af Amer: 78 mL/min — ABNORMAL LOW (ref >90)
GFR calc non Af Amer: 67 mL/min — ABNORMAL LOW (ref >90)
Glucose, Bld: 180 mg/dL — ABNORMAL HIGH (ref 70–99)
POTASSIUM: 4.9 mmol/L (ref 3.5–5.1)
Sodium: 142 mmol/L (ref 135–145)
TOTAL PROTEIN: 6.4 g/dL (ref 6.0–8.3)

## 2014-11-09 LAB — TROPONIN I
Troponin I: 0.03 ng/mL (ref <0.031)
Troponin I: 0.03 ng/mL (ref <0.031)

## 2014-11-09 LAB — MAGNESIUM: Magnesium: 1.9 mg/dL (ref 1.5–2.5)

## 2014-11-09 LAB — GLUCOSE, CAPILLARY
Glucose-Capillary: 161 mg/dL — ABNORMAL HIGH (ref 70–99)
Glucose-Capillary: 315 mg/dL — ABNORMAL HIGH (ref 70–99)
Glucose-Capillary: 285 mg/dL — ABNORMAL HIGH (ref 70–99)
Glucose-Capillary: 170 mg/dL — ABNORMAL HIGH (ref 70–99)

## 2014-11-09 MED ORDER — OMEGA-3-ACID ETHYL ESTERS 1 G PO CAPS
2.0000 g | ORAL_CAPSULE | Freq: Every day | ORAL | Status: DC
  Administered 2014-11-09 – 2014-11-10 (×2): 2 g via ORAL
  Filled 2014-11-09 (×3): qty 2

## 2014-11-09 MED ORDER — CARVEDILOL 6.25 MG PO TABS
6.2500 mg | ORAL_TABLET | Freq: Two times a day (BID) | ORAL | Status: DC
  Administered 2014-11-09 – 2014-11-11 (×4): 6.25 mg via ORAL
  Filled 2014-11-09 (×6): qty 1

## 2014-11-09 MED ORDER — INSULIN ASPART 100 UNIT/ML ~~LOC~~ SOLN
6.0000 [IU] | Freq: Three times a day (TID) | SUBCUTANEOUS | Status: DC
  Administered 2014-11-09 – 2014-11-10 (×3): 6 [IU] via SUBCUTANEOUS

## 2014-11-09 MED ORDER — FUROSEMIDE 10 MG/ML IJ SOLN
40.0000 mg | Freq: Two times a day (BID) | INTRAMUSCULAR | Status: DC
  Administered 2014-11-09 (×2): 40 mg via INTRAVENOUS
  Filled 2014-11-09 (×4): qty 4

## 2014-11-09 MED ORDER — INSULIN ASPART 100 UNIT/ML ~~LOC~~ SOLN: 0.0000 [IU] | Freq: Every day | SUBCUTANEOUS | Status: DC

## 2014-11-09 MED ORDER — INSULIN GLARGINE 100 UNIT/ML ~~LOC~~ SOLN
50.0000 [IU] | Freq: Every day | SUBCUTANEOUS | Status: DC
  Administered 2014-11-09 – 2014-11-10 (×2): 50 [IU] via SUBCUTANEOUS
  Filled 2014-11-09 (×2): qty 0.5

## 2014-11-09 MED ORDER — MAGNESIUM OXIDE 400 (241.3 MG) MG PO TABS
400.0000 mg | ORAL_TABLET | Freq: Every day | ORAL | Status: DC
  Administered 2014-11-09 – 2014-11-10 (×2): 400 mg via ORAL
  Filled 2014-11-09 (×3): qty 1

## 2014-11-09 MED ORDER — INSULIN ASPART 100 UNIT/ML ~~LOC~~ SOLN
0.0000 [IU] | Freq: Three times a day (TID) | SUBCUTANEOUS | Status: DC
  Administered 2014-11-09: 11 [IU] via SUBCUTANEOUS
  Administered 2014-11-10: 4 [IU] via SUBCUTANEOUS
  Administered 2014-11-10 (×2): 3 [IU] via SUBCUTANEOUS

## 2014-11-09 NOTE — Progress Notes (Signed)
UR completed 

## 2014-11-09 NOTE — Evaluation (Signed)
Physical Therapy Evaluation Patient Details Name: Mark Roberson MRN: [id number] DOB: 04/18/1946 Today's Date: 11/09/2014   History of Present Illness  [age] yo male admitted with shortness of breath. hx of L TKA 08/2014, CHF, A fib, DM, HTN, obesity  Clinical Impression  On eval, pt required Min guard assist for mobility-able to ambulate ~115 feet with RW. Pt fatigues fairly easily. O2 sats 95% on RA during ambulation, 88-89% on RA after getting back to bed (lying supine). VCs for pursed lip breathing. Reapplied [place] O2 2L and sats recovered to 98%.     Follow Up Recommendations Home health PT;Supervision - Intermittent    Equipment Recommendations  None recommended by PT    Recommendations for Other Services       Precautions / Restrictions Precautions Precautions: Fall Precaution Comments: monitor sats Restrictions Weight Bearing Restrictions: No      Mobility  Bed Mobility Overal bed mobility: Needs Assistance Bed Mobility: Sit to Supine       Sit to supine: Min guard   General bed mobility comments: close guard for safety  Transfers Overall transfer level: Needs assistance Equipment used: Rolling walker (2 wheeled) Transfers: Sit to/from Stand Sit to Stand: Min guard         General transfer comment: close guard for safety.   Ambulation/Gait Ambulation/Gait assistance: Min guard Ambulation Distance (Feet): 115 Feet Assistive device: Rolling walker (2 wheeled) Gait Pattern/deviations: Step-through pattern;Trunk flexed     General Gait Details: close guard and chair follow at pt's request. O2 sats 95% on RA during ambulation. Pt fatigues fairly easily.   Stairs            Wheelchair Mobility    Modified Rankin (Stroke Patients Only)       Balance Overall balance assessment: Needs assistance         Standing balance support: Bilateral upper extremity supported;During functional activity Standing balance-Leahy Scale: Poor                                Pertinent Vitals/Pain Pain Assessment: No/denies pain    Home Living Family/patient expects to be discharged to:: Unsure     Type of Home: House Home Access: Stairs to enter Entrance Stairs-Rails: Right Entrance Stairs-Number of Steps: 3 Home Layout: One level Home Equipment: Environmental consultantWalker - 2 wheels Additional Comments: pt not very forthcoming with PLOF/home environment info    Prior Function Level of Independence: Independent with assistive device(s)               Hand Dominance        Extremity/Trunk Assessment   Upper Extremity Assessment: Generalized weakness           Lower Extremity Assessment: Generalized weakness      Cervical / Trunk Assessment: Normal  Communication   Communication: No difficulties  Cognition Arousal/Alertness: Awake/alert Behavior During Therapy: WFL for tasks assessed/performed Overall Cognitive Status: Within Functional Limits for tasks assessed                      General Comments      Exercises        Assessment/Plan    PT Assessment Patient needs continued PT services  PT Diagnosis Difficulty walking;Generalized weakness   PT Problem List Decreased activity tolerance;Decreased balance;Decreased mobility;Obesity;Decreased strength  PT Treatment Interventions DME instruction;Gait training;Functional mobility training;Therapeutic activities;Therapeutic exercise;Patient/family education;Balance training   PT Goals (Current goals can be found  in the Care Plan section) Acute Rehab PT Goals Patient Stated Goal: none stated PT Goal Formulation: With patient Time For Goal Achievement: 11/23/14 Potential to Achieve Goals: Good    Frequency Min 3X/week   Barriers to discharge        Co-evaluation               End of Session   Activity Tolerance: Patient limited by fatigue Patient left: with call bell/phone within reach      Functional Assessment Tool Used: clinical  judgement Functional Limitation: Mobility: Walking and moving around Mobility: Walking and Moving Around Current Status (Z6109): At least 1 percent but less than 20 percent impaired, limited or restricted Mobility: Walking and Moving Around Goal Status [phone]): At least 1 percent but less than 20 percent impaired, limited or restricted    Time: 1340-1356 PT Time Calculation (min) (ACUTE ONLY): 16 min   Charges:   PT Evaluation $Initial PT Evaluation Tier I: 1 Procedure     PT G Codes:   PT G-Codes **NOT FOR INPATIENT CLASS** Functional Assessment Tool Used: clinical judgement Functional Limitation: Mobility: Walking and moving around Mobility: Walking and Moving Around Current Status (U9811): At least 1 percent but less than 20 percent impaired, limited or restricted Mobility: Walking and Moving Around Goal Status [phone]): At least 1 percent but less than 20 percent impaired, limited or restricted    Rebeca Alert, MPT Pager: [phone]

## 2014-11-09 NOTE — Progress Notes (Signed)
  Echocardiogram 2D Echocardiogram has been performed.  Gale Klar FRANCES 11/09/2014, 12:51 PM

## 2014-11-09 NOTE — Consult Note (Signed)
WOC wound consult note Reason for Consult: Linear, full thickness ulceration toward right of gluteal cleft:  Not a pressure ulcer. Wound type:Etiology not known.  Suspect moisture and friction in this area Pressure Ulcer POA: No Measurement:4cm x 0.6cm x 0.2cm Wound ZOX:WRUEbed:Pink, moist with evidence of healing (scarring) at periphery Drainage (amount, consistency, odor) scant serous to light yellow Periwound:intact, moist Dressing procedure/placement/frequency: I will provide a bariatric bed that is more appropriate for this gentleman's size as well as a pressure redistribution chair pad for his use when he is OOB in chair.  He is to be encouraged to lie on his side while in bed.  A soft silicone foam dressing is provided as a topical dressing for this area.  If it becomes too difficult to maintain in this area, I have instructed he nursing staff that our moisture barrier ointment, applied sparingly and frequently would suffice as a substitution for the topical silicone dressing. WOC nursing team will not follow, but will remain available to this patient, the nursing and medical team.  Please re-consult if needed. Thanks, Mark MowLaurie Zadiel Leyh, MSN, RN, GNP, [place], CWON-AP [phone])

## 2014-11-09 NOTE — Progress Notes (Signed)
[facility] PROGRESS NOTE   Assessment/Plan: Acute respiratory failure with hypoxia - CT Angio of the chest showed no PE or pulmonary edema. - His BNP was elevated at 300, 2-D echo pending.  - Has not been evaluated in the past for obstructive sleep apnea will need a sleep study as an outpatient.  A. fib rate controlled: - Resume his Coreg, he is currently rate controlled, continue Xarelto.  Question of indeterminate heart failure: - As per his diagnosis list on care everywhere, 2-D echo is pending. - He was started on IV Lasix. He had moderate urine output. - Strict I's and O's monitor electrolytes.  Essential hypertension: - Continue IV Lasix and ACE inhibitor.  Insulin-dependent diabetes mellitus. - His last hemoglobin A1c was 7.6. Continue Lantus plus sliding scale insulin.  Decubitus ulcer -  WOC   Code Status: full Family Communication: none  Disposition Plan: inpatient   Consultants:  none  Procedures:  echo  Antibiotics:  None  HPI/Subjective: He relates his shortness of breath is better, was His improve.  Objective: Filed Vitals:   11/08/14 1721 11/08/14 1851 11/08/14 2208 11/09/14 0540  BP: 123/49  127/53 120/43  Pulse: 53  57 58  Temp: 98.2 F (36.8 C)  98.9 F (37.2 C) 98.7 F (37.1 C)  TempSrc: Oral  Oral Oral  Resp: 20  20 18   Height:  6' (1.829 m)    Weight: 154.268 kg (340 lb 1.6 oz)     SpO2: 99%  99% 99%    Intake/Output Summary (Last 24 hours) at 11/09/14 1014 Last data filed at 11/09/14 0934  Gross per 24 hour  Intake    500 ml  Output   2420 ml  Net  -1920 ml   Filed Weights   11/08/14 1132 11/08/14 1721  Weight: 153.316 kg (338 lb) 154.268 kg (340 lb 1.6 oz)    Exam:  General: Alert, awake, oriented x3, in no acute distress.  HEENT: No bruits, no goiter.  Heart: Regular rate and rhythm. Lungs: Good air movement, clear Abdomen: Soft, nontender, nondistended, positive bowel sounds.    Data  Reviewed: Basic Metabolic Panel:  Recent Labs Lab 11/08/14 1236 11/09/14 0500 11/09/14 0505  NA 144  --  142  K 4.4  --  4.9  CL 104  --  99  CO2 34*  --  33*  GLUCOSE 130*  --  180*  BUN 26*  --  22  CREATININE 1.21  --  1.10  CALCIUM 8.8  --  9.1  MG  --  1.9  --    Liver Function Tests:  Recent Labs Lab 11/09/14 0505  AST 22  ALT 18  ALKPHOS 57  BILITOT 0.8  PROT 6.4  ALBUMIN 3.1*   No results for input(s): LIPASE, AMYLASE in the last 168 hours. No results for input(s): AMMONIA in the last 168 hours. CBC:  Recent Labs Lab 11/08/14 1236 11/09/14 0505  WBC 7.6 7.3  HGB 9.7* 9.8*  HCT 32.6* 33.3*  MCV 97.3 97.4  PLT 155 160   Cardiac Enzymes:  Recent Labs Lab 11/08/14 1906 11/08/14 2239 11/09/14 0505  TROPONINI 0.03 0.03 0.03   BNP (last 3 results)  Recent Labs  11/08/14 1236  BNP 300.9*    ProBNP (last 3 results) No results for input(s): PROBNP in the last 8760 hours.  CBG:  Recent Labs Lab 11/08/14 1737 11/08/14 2201 11/09/14 0733  GLUCAP 117* 224* 161*    No results found for this  or any previous visit (from the past 240 hour(s)).   Studies: Dg Chest 2 View  11/08/2014   CLINICAL DATA:  Congestive heart failure, recent left knee replacement  EXAM: CHEST  2 VIEW  COMPARISON:  None.  FINDINGS: Cardiomegaly is noted. Central mild vascular congestion without pulmonary edema. No segmental infiltrate. There is elevation of the right hemidiaphragm with right basilar atelectasis. Osteopenia and mild degenerative changes thoracic spine.  IMPRESSION: Cardiomegaly. Central mild vascular congestion without pulmonary edema. Elevation of the right hemidiaphragm with right basilar atelectasis. No segmental infiltrate.   Electronically Signed   By: Natasha Mead M.D.   On: 11/08/2014 13:34   Ct Angio Chest Pe W/cm &/or Wo Cm  11/08/2014   CLINICAL DATA:  68 year old male with shortness of breath and recent left knee replacement. Query pulmonary  embolus. Initial encounter.  EXAM: CT ANGIOGRAPHY CHEST WITH CONTRAST  TECHNIQUE: Multidetector CT imaging of the chest was performed using the standard protocol during bolus administration of intravenous contrast. Multiplanar CT image reconstructions and MIPs were obtained to evaluate the vascular anatomy.  CONTRAST:  OMNIPAQUE IOHEXOL 350 MG/ML SOLN  COMPARISON:  Chest radiographs 1302 hours today.  FINDINGS: Suboptimal contrast bolus timing in the pulmonary arterial tree. No pulmonary artery filling defect identified.  Cardiomegaly. No pericardial or pleural effusion. Calcified atherosclerosis of the aorta and its branches, including the coronary arteries. Subcentimeter mediastinal and hilar lymph nodes. Among the largest nodes are those at the prevascular station measuring up to 9 mm short axis. No axillary lymphadenopathy. Negative thoracic inlet.  Atelectatic changes to the major airways bilaterally. Ground-glass opacity in the upper lobes with mosaic attenuation pattern. Less pronounced similar changes in both lower lobes. Mild superimposed lower lobe atelectasis. No consolidation.  No acute osseous abnormality identified.  Negative visualized liver, gallbladder, spleen and bowel in the upper abdomen.  Review of the MIP images confirms the above findings.  IMPRESSION: 1. Suboptimal pulmonary artery contrast bolus, but no evidence of acute pulmonary embolus. 2. Cardiomegaly. Calcified atherosclerosis of the aorta and coronary arteries. 3. Atelectasis and mosaic attenuation in both lungs. Consider small airway disease, air trapping, chronic vascular disease.   Electronically Signed   By: Odessa Fleming M.D.   On: 11/08/2014 14:25    Scheduled Meds: . carvedilol  6.25 mg Oral BID WC  . furosemide  40 mg Intravenous Q12H  . insulin aspart  0-5 Units Subcutaneous QHS  . insulin aspart  0-9 Units Subcutaneous TID WC  . magnesium oxide  400 mg Oral QHS  . omega-3 acid ethyl esters  2 g Oral QHS  .  rivaroxaban  20 mg Oral Q supper  . sodium chloride  3 mL Intravenous Q12H   Continuous Infusions:    Marinda Elk  [facility] Pager [phone]. If 8PM-8AM, please contact night-coverage at [email or web address], password [facility] 11/09/2014, 10:14 AM

## 2014-11-09 NOTE — Progress Notes (Signed)
CARE MANAGEMENT NOTE 11/09/2014  Patient:  Mark Roberson,Mark Roberson   Account Number:  [id number]  Date Initiated:  11/09/2014  Documentation initiated by:  Ferdinand CavaSCHETTINO,Zyana Amaro  Subjective/Objective Assessment:   69 yo male admitted with shortness of breath     Action/Plan:   dsicharge planning   Anticipated DC Date:  11/10/2014   Anticipated DC Plan:  HOME W HOME HEALTH SERVICES      DC Planning Services  CM consult      Choice offered to / List presented to:        DME agency  [facility]        [facility]   Status of service:  In process, will continue to follow Medicare Important Message given?   (If response is "NO", the following Medicare IM given date fields will be blank) Date Medicare IM given:   Medicare IM given by:   Date Additional Medicare IM given:   Additional Medicare IM given by:    Discharge Disposition:    Per UR Regulation:    If discussed at Long Length of Stay Meetings, dates discussed:    Comments:  11/09/14 Ferdinand CavaAndrea Schettino RN [facility] CM [phone] Patient stated that he moved ot [place] on Saturday because he does not have any support in [place]. He stated that he has been in and out of the hospital and SNF's in [place] for the past 3-4 months. He is staying with a friend and plans on discharging and [place]. Spoke with Tresa EndoKelly, patient friend, and she stated that the patient has been SOB and needing a lot of assist in the home. [facility], Tresa EndoKelly also discussed the need for RN, SW, aide upon discharge. Kelly requested a hosptial bed and a 3n1 BSC and neb machine has already been ordered. Will continue to follow if patient qualifies for home O2.

## 2014-11-09 NOTE — Progress Notes (Signed)
ED CM received follow up call from niece CM followed up with her on medication verification and review of orders entered for preparation for d/c when decided by attending.   Updated Unit CM

## 2014-11-10 ENCOUNTER — Encounter (HOSPITAL_COMMUNITY): Payer: Self-pay

## 2014-11-10 DIAGNOSIS — I5031 Acute diastolic (congestive) heart failure: Secondary | ICD-10-CM

## 2014-11-10 LAB — BASIC METABOLIC PANEL
Anion gap: 7 (ref 5–15)
BUN: 19 mg/dL (ref 6–23)
CHLORIDE: 95 mmol/L — AB (ref 96–112)
CO2: 37 mmol/L — AB (ref 19–32)
Calcium: 8.7 mg/dL (ref 8.4–10.5)
Creatinine, Ser: 1.16 mg/dL (ref 0.50–1.35)
GFR calc Af Amer: 73 mL/min — ABNORMAL LOW (ref >90)
GFR calc non Af Amer: 63 mL/min — ABNORMAL LOW (ref >90)
GLUCOSE: 132 mg/dL — AB (ref 70–99)
Potassium: 4 mmol/L (ref 3.5–5.1)
SODIUM: 139 mmol/L (ref 135–145)

## 2014-11-10 LAB — GLUCOSE, CAPILLARY
GLUCOSE-CAPILLARY: 127 mg/dL — AB (ref 70–99)
GLUCOSE-CAPILLARY: 129 mg/dL — AB (ref 70–99)
GLUCOSE-CAPILLARY: 184 mg/dL — AB (ref 70–99)
Glucose-Capillary: 153 mg/dL — ABNORMAL HIGH (ref 70–99)

## 2014-11-10 MED ORDER — TORSEMIDE 20 MG PO TABS
20.0000 mg | ORAL_TABLET | Freq: Every day | ORAL | Status: DC
  Administered 2014-11-10: 20 mg via ORAL
  Filled 2014-11-10 (×2): qty 1

## 2014-11-10 NOTE — Progress Notes (Signed)
[facility] PROGRESS NOTE   Assessment/Plan: Acute respiratory failure with hypoxia due to acute decompensated systolic heart failure - CT Angio of the chest showed no PE or pulmonary edema. - His BNP was elevated at 300, 2-D echo as below - Will need a sleep study as an outpatient.  A. fib rate controlled: - Continue his Coreg, he is currently rate controlled, continue Xarelto.  Acute diastolic heart failure: - 2-D echo as below, he was started on IV Lasix with good diuresis. - Continue strict I's and O's, monitor lytes. - Change Lasix to torsemide home dose.  Essential hypertension: - Continue torsemide and ACE inhibitor.  Insulin-dependent diabetes mellitus. - His last hemoglobin A1c was 7.6. Continue Lantus plus sliding scale insulin.  Decubitus ulcer -  WOC   Code Status: full Family Communication: none  Disposition Plan:Home in a.m.  Consultants:  none  Procedures:  Echo 2.18.2016: Systolic function was normal. The estimated ejection fraction was in the range of 60% to 65%. Although no diagnostic regional wall motion abnormality was identified, this possibility cannot be completely excluded on the basis of this study. Normal motion abnormality pulmonary pressures 50 mmhg.    Antibiotics:  None  HPI/Subjective: He relates his shortness of breath is better,  Objective: Filed Vitals:   11/09/14 2037 11/10/14 0505 11/10/14 0740 11/10/14 0949  BP: 104/50 129/53 119/45   Pulse: 55 65 61   Temp: 98.7 F (37.1 C) 98.7 F (37.1 C) 98.7 F (37.1 C)   TempSrc: Oral Oral Oral   Resp:  16 20   Height:      Weight:    148.78 kg (328 lb)  SpO2: 100% 100% 99%     Intake/Output Summary (Last 24 hours) at 11/10/14 0954 Last data filed at 11/10/14 0820  Gross per 24 hour  Intake    360 ml  Output   3000 ml  Net  -2640 ml   Filed Weights   11/08/14 1721 11/09/14 1510 11/10/14 0949  Weight: 154.268 kg (340 lb 1.6 oz) 155 kg (341 lb 11.4 oz)  148.78 kg (328 lb)    Exam:  General: Alert, awake, oriented x3, in no acute distress.  HEENT: No bruits, no goiter. -JVD Heart: Regular rate and rhythm. Lungs: Good air movement, clear Abdomen: Soft, nontender, nondistended, positive bowel sounds.    Data Reviewed: Basic Metabolic Panel:  Recent Labs Lab 11/08/14 1236 11/09/14 0500 11/09/14 0505 11/10/14 0525  NA 144  --  142 139  K 4.4  --  4.9 4.0  CL 104  --  99 95*  CO2 34*  --  33* 37*  GLUCOSE 130*  --  180* 132*  BUN 26*  --  22 19  CREATININE 1.21  --  1.10 1.16  CALCIUM 8.8  --  9.1 8.7  MG  --  1.9  --   --    Liver Function Tests:  Recent Labs Lab 11/09/14 0505  AST 22  ALT 18  ALKPHOS 57  BILITOT 0.8  PROT 6.4  ALBUMIN 3.1*   No results for input(s): LIPASE, AMYLASE in the last 168 hours. No results for input(s): AMMONIA in the last 168 hours. CBC:  Recent Labs Lab 11/08/14 1236 11/09/14 0505  WBC 7.6 7.3  HGB 9.7* 9.8*  HCT 32.6* 33.3*  MCV 97.3 97.4  PLT 155 160   Cardiac Enzymes:  Recent Labs Lab 11/08/14 1906 11/08/14 2239 11/09/14 0505  TROPONINI 0.03 0.03 0.03   BNP (last 3  results)  Recent Labs  11/08/14 1236  BNP 300.9*    ProBNP (last 3 results) No results for input(s): PROBNP in the last 8760 hours.  CBG:  Recent Labs Lab 11/09/14 0733 11/09/14 1150 11/09/14 1631 11/09/14 2209 11/10/14 0816  GLUCAP 161* 315* 285* 170* 127*    No results found for this or any previous visit (from the past 240 hour(s)).   Studies: Dg Chest 2 View  11/08/2014   CLINICAL DATA:  Congestive heart failure, recent left knee replacement  EXAM: CHEST  2 VIEW  COMPARISON:  None.  FINDINGS: Cardiomegaly is noted. Central mild vascular congestion without pulmonary edema. No segmental infiltrate. There is elevation of the right hemidiaphragm with right basilar atelectasis. Osteopenia and mild degenerative changes thoracic spine.  IMPRESSION: Cardiomegaly. Central mild vascular  congestion without pulmonary edema. Elevation of the right hemidiaphragm with right basilar atelectasis. No segmental infiltrate.   Electronically Signed   By: Natasha MeadLiviu  Pop M.D.   On: 11/08/2014 13:34   Ct Angio Chest Pe W/cm &/or Wo Cm  11/08/2014   CLINICAL DATA:  69 year old male with shortness of breath and recent left knee replacement. Query pulmonary embolus. Initial encounter.  EXAM: CT ANGIOGRAPHY CHEST WITH CONTRAST  TECHNIQUE: Multidetector CT imaging of the chest was performed using the standard protocol during bolus administration of intravenous contrast. Multiplanar CT image reconstructions and MIPs were obtained to evaluate the vascular anatomy.  CONTRAST:  100mL OMNIPAQUE IOHEXOL 350 MG/ML SOLN  COMPARISON:  Chest radiographs 1302 hours today.  FINDINGS: Suboptimal contrast bolus timing in the pulmonary arterial tree. No pulmonary artery filling defect identified.  Cardiomegaly. No pericardial or pleural effusion. Calcified atherosclerosis of the aorta and its branches, including the coronary arteries. Subcentimeter mediastinal and hilar lymph nodes. Among the largest nodes are those at the prevascular station measuring up to 9 mm short axis. No axillary lymphadenopathy. Negative thoracic inlet.  Atelectatic changes to the major airways bilaterally. Ground-glass opacity in the upper lobes with mosaic attenuation pattern. Less pronounced similar changes in both lower lobes. Mild superimposed lower lobe atelectasis. No consolidation.  No acute osseous abnormality identified.  Negative visualized liver, gallbladder, spleen and bowel in the upper abdomen.  Review of the MIP images confirms the above findings.  IMPRESSION: 1. Suboptimal pulmonary artery contrast bolus, but no evidence of acute pulmonary embolus. 2. Cardiomegaly. Calcified atherosclerosis of the aorta and coronary arteries. 3. Atelectasis and mosaic attenuation in both lungs. Consider small airway disease, air trapping, chronic vascular  disease.   Electronically Signed   By: Odessa FlemingH  Hall M.D.   On: 11/08/2014 14:25    Scheduled Meds: . carvedilol  6.25 mg Oral BID WC  . furosemide  40 mg Intravenous Q12H  . insulin aspart  0-20 Units Subcutaneous TID WC  . insulin aspart  0-5 Units Subcutaneous QHS  . insulin aspart  6 Units Subcutaneous TID WC  . insulin glargine  50 Units Subcutaneous QHS  . magnesium oxide  400 mg Oral QHS  . omega-3 acid ethyl esters  2 g Oral QHS  . rivaroxaban  20 mg Oral Q supper  . sodium chloride  3 mL Intravenous Q12H   Continuous Infusions:    Marinda ElkFELIZ ORTIZ, Brinton Brandel  [facility] Pager [phone]. If 8PM-8AM, please contact night-coverage at [email or web address], password [email or web address] 11/10/2014, 9:54 AM  LOS: 1 day

## 2014-11-11 DIAGNOSIS — E0821 Diabetes mellitus due to underlying condition with diabetic nephropathy: Secondary | ICD-10-CM

## 2014-11-11 LAB — GLUCOSE, CAPILLARY
GLUCOSE-CAPILLARY: 94 mg/dL (ref 70–99)
GLUCOSE-CAPILLARY: 113 mg/dL — AB (ref 70–99)

## 2014-11-11 LAB — BASIC METABOLIC PANEL
Anion gap: 9 (ref 5–15)
BUN: 22 mg/dL (ref 6–23)
CO2: 37 mmol/L — AB (ref 19–32)
Calcium: 8.7 mg/dL (ref 8.4–10.5)
Chloride: 95 mmol/L — ABNORMAL LOW (ref 96–112)
Creatinine, Ser: 1.27 mg/dL (ref 0.50–1.35)
GFR calc Af Amer: 65 mL/min — ABNORMAL LOW (ref >90)
GFR calc non Af Amer: 56 mL/min — ABNORMAL LOW (ref >90)
GLUCOSE: 108 mg/dL — AB (ref 70–99)
Potassium: 4.4 mmol/L (ref 3.5–5.1)
Sodium: 141 mmol/L (ref 135–145)

## 2014-11-11 NOTE — Discharge Summary (Addendum)
Physician Discharge Summary  Mark Roberson NWG:[id number] DOB: [birth year] DOA: 11/08/2014  PCP: [name], MD  Admit date: 11/08/2014 Discharge date: 11/11/2014  Time spent: 30 minutes  Recommendations for Outpatient Follow-up:  1. Follow-up with PCP as an outpatient 2-4 weeks.  Discharge Diagnoses:  Active Problems:   Acute respiratory failure with hypoxia   Hypoxia   Morbid obesity   Decubitus ulcer   HTN (hypertension)   Atrial flutter   DM (diabetes mellitus)   Acute diastolic congestive heart failure   Discharge Condition: stable  Diet recommendation: low sodium  Filed Weights   11/09/14 1510 11/10/14 0949 11/11/14 0626  Weight: 155 kg (341 lb 11.4 oz) 148.78 kg (328 lb) 143 kg (315 lb 4.1 oz)    History of present illness:  69 y.o. male has a past medical history significant for coronary artery disease status post stent placement, Insulin-dependent diabetes mellitus , hypertension, hyperlipidemia, sleep apnea , presents to the emergency room with a chief complaint of hypoxia. Patient just moved to [place] from [place]. He was hospitalized there at [facility] "heart problems", hyperkalemia, elevated troponin, apparently was in A. Fib and patient tells me that he had a TEE and he was converted back to sinus rhythm. He was discharged to a rehabilitation facility, where he spent about couple weeks, and he tells me that he was not very happy there as they did not monitor him accurately , and he tells me that He has gained several pounds during his stay and he became progressively short of breath. He was discharged from the rehabilitation facility on Saturday morning , and at that time he decided to drive to [place] and he stays here with his friends. Over the last couple days, patient has continued to feel dyspneic with exertion, he recently bought a pulse oximeter and he noticed that his oxygenation levels in the 70s. He currently denies chest pain, but  had a little bit earlier, he denies any shortness of breath , denies any fever or chills. He denies any abdominal pain, nausea, vomiting or diarrhea. He also endorses a buttock wound which was healing well and he received one care in the rehabilitation facility, however he feels like it opened up during the drive down here.   Hospital Course:  Acute respiratory failure with hypoxia due to acute decompensated diastolic heart failure - CT Angio of the chest showed no PE or pulmonary edema. - His BNP was elevated at 300, 2-D echo as below - Will need a sleep study as an outpatient. - Patient did not desaturate with ambulation without oxygen.  A. fib rate controlled: - Continue his Coreg, he is currently rate controlled, continue Xarelto.  Acute diastolic heart failure: - Started on IV Lasix with good diuresis. - Strict I's and O's and we monitor lytes. -  Lasix was change to torsemide home dose. - Creatinine remains stable.  Essential hypertension: - Continue torsemide and ACE inhibitor. - No changes were made.  Insulin-dependent diabetes mellitus. - His last hemoglobin A1c was 7.6.  - No changes were made he will need to follow-up with his primary care doctor and titrate insulin as needed.  Decubitus ulcer -Wound care was consulted they recommended him to be encouraged to lie on his side while in bed. A soft silicone foam dressing is provided as a topical dressing for this area  Procedures:  CXR  Consultations:  none  Discharge Exam: Filed Vitals:   11/11/14 0626  BP: 124/56  Pulse:  58  Temp: 98.3 F (36.8 C)  Resp: 20    General: A&O x3 Cardiovascular: RRR Respiratory: good air movement CTA B/L  Discharge Instructions   Discharge Instructions    Diet - low sodium heart healthy    Complete by:  As directed      Increase activity slowly    Complete by:  As directed           Current Discharge Medication List    CONTINUE these medications which have NOT  CHANGED   Details  albuterol (PROVENTIL HFA;VENTOLIN HFA) 108 (90 BASE) MCG/ACT inhaler Inhale 2 puffs into the lungs every 6 (six) hours as needed for shortness of breath (shortness of breath).    atorvastatin (LIPITOR) 20 MG tablet Take 20 mg by mouth at bedtime.     carvedilol (COREG) 6.25 MG tablet Take 6.25 mg by mouth 2 (two) times daily with a meal.    cholecalciferol (VITAMIN D) 1000 UNITS tablet Take 1,000 Units by mouth at bedtime.    gabapentin (NEURONTIN) 300 MG capsule Take 300 mg by mouth 3 (three) times daily.    insulin aspart (NOVOLOG) 100 UNIT/ML FlexPen Inject 25 Units into the skin 3 (three) times daily with meals.     insulin glargine (LANTUS) 100 UNIT/ML injection Inject 66 Units into the skin at bedtime.     lisinopril (PRINIVIL,ZESTRIL) 10 MG tablet Take 10 mg by mouth at bedtime.     magnesium oxide (MAG-OX) 400 MG tablet Take 400 mg by mouth at bedtime.    omega-3 acid ethyl esters (LOVAZA) 1 G capsule Take 2 g by mouth at bedtime.    rivaroxaban (XARELTO) 20 MG TABS tablet Take 20 mg by mouth daily.    sitaGLIPtin (JANUVIA) 100 MG tablet Take 100 mg by mouth daily.    torsemide (DEMADEX) 20 MG tablet Take 20 mg by mouth at bedtime.       STOP taking these medications     NYSTATIN PO      sodium chloride (OCEAN) 0.65 % SOLN nasal spray        Allergies  Allergen Reactions  . Percocet [Oxycodone-Acetaminophen] Other (See Comments)    hallucination  . Penicillins Hives   Follow-up Information    Follow up with [name], MD On 11/14/2014.   Specialty:  Internal Medicine   Why:  You have an appointment on Monday November 14 2014 at 4 pm with Dr Genella Mech at [facility] information:   [address] [place] [address] [phone]        The results of significant diagnostics from this hospitalization (including imaging, microbiology, ancillary and laboratory) are listed below for reference.    Significant  Diagnostic Studies: Dg Chest 2 View  11/08/2014   CLINICAL DATA:  Congestive heart failure, recent left knee replacement  EXAM: CHEST  2 VIEW  COMPARISON:  None.  FINDINGS: Cardiomegaly is noted. Central mild vascular congestion without pulmonary edema. No segmental infiltrate. There is elevation of the right hemidiaphragm with right basilar atelectasis. Osteopenia and mild degenerative changes thoracic spine.  IMPRESSION: Cardiomegaly. Central mild vascular congestion without pulmonary edema. Elevation of the right hemidiaphragm with right basilar atelectasis. No segmental infiltrate.   Electronically Signed   By: Natasha Mead M.D.   On: 11/08/2014 13:34   Ct Angio Chest Pe W/cm &/or Wo Cm  11/08/2014   CLINICAL DATA:  69 year old male with shortness of breath and recent left knee replacement. Query pulmonary embolus.  Initial encounter.  EXAM: CT ANGIOGRAPHY CHEST WITH CONTRAST  TECHNIQUE: Multidetector CT imaging of the chest was performed using the standard protocol during bolus administration of intravenous contrast. Multiplanar CT image reconstructions and MIPs were obtained to evaluate the vascular anatomy.  CONTRAST:  100mL OMNIPAQUE IOHEXOL 350 MG/ML SOLN  COMPARISON:  Chest radiographs 1302 hours today.  FINDINGS: Suboptimal contrast bolus timing in the pulmonary arterial tree. No pulmonary artery filling defect identified.  Cardiomegaly. No pericardial or pleural effusion. Calcified atherosclerosis of the aorta and its branches, including the coronary arteries. Subcentimeter mediastinal and hilar lymph nodes. Among the largest nodes are those at the prevascular station measuring up to 9 mm short axis. No axillary lymphadenopathy. Negative thoracic inlet.  Atelectatic changes to the major airways bilaterally. Ground-glass opacity in the upper lobes with mosaic attenuation pattern. Less pronounced similar changes in both lower lobes. Mild superimposed lower lobe atelectasis. No consolidation.  No acute  osseous abnormality identified.  Negative visualized liver, gallbladder, spleen and bowel in the upper abdomen.  Review of the MIP images confirms the above findings.  IMPRESSION: 1. Suboptimal pulmonary artery contrast bolus, but no evidence of acute pulmonary embolus. 2. Cardiomegaly. Calcified atherosclerosis of the aorta and coronary arteries. 3. Atelectasis and mosaic attenuation in both lungs. Consider small airway disease, air trapping, chronic vascular disease.   Electronically Signed   By: Odessa FlemingH  Hall M.D.   On: 11/08/2014 14:25    Microbiology: No results found for this or any previous visit (from the past 240 hour(s)).   Labs: Basic Metabolic Panel:  Recent Labs Lab 11/08/14 1236 11/09/14 0500 11/09/14 0505 11/10/14 0525 11/11/14 0555  NA 144  --  142 139 141  K 4.4  --  4.9 4.0 4.4  CL 104  --  99 95* 95*  CO2 34*  --  33* 37* 37*  GLUCOSE 130*  --  180* 132* 108*  BUN 26*  --  22 19 22   CREATININE 1.21  --  1.10 1.16 1.27  CALCIUM 8.8  --  9.1 8.7 8.7  MG  --  1.9  --   --   --    Liver Function Tests:  Recent Labs Lab 11/09/14 0505  AST 22  ALT 18  ALKPHOS 57  BILITOT 0.8  PROT 6.4  ALBUMIN 3.1*   No results for input(s): LIPASE, AMYLASE in the last 168 hours. No results for input(s): AMMONIA in the last 168 hours. CBC:  Recent Labs Lab 11/08/14 1236 11/09/14 0505  WBC 7.6 7.3  HGB 9.7* 9.8*  HCT 32.6* 33.3*  MCV 97.3 97.4  PLT 155 160   Cardiac Enzymes:  Recent Labs Lab 11/08/14 1906 11/08/14 2239 11/09/14 0505  TROPONINI 0.03 0.03 0.03   BNP: BNP (last 3 results)  Recent Labs  11/08/14 1236  BNP 300.9*    ProBNP (last 3 results) No results for input(s): PROBNP in the last 8760 hours.  CBG:  Recent Labs Lab 11/10/14 0816 11/10/14 1135 11/10/14 1704 11/10/14 2210 11/11/14 0728  GLUCAP 127* 153* 129* 184* 94       Signed:  FELIZ ORTIZ, Kaniesha Barile  [facility] 11/11/2014, 9:39 AM

## 2014-11-11 NOTE — Progress Notes (Signed)
DC instructions reviewed with the pt and all questions and concerns were addressed. Pt stated to me that he [facility] not [facility] and did not want us to have him one delivered. This was passed along to the case manager. Pt VSS, no c/o pain at this time, no s/s of distress or discomfort. Pt does not qualify for home 02 although he continues to request for it. Stage II to his sacrum that was present on admission. Will assist pt getting dressed and ready for DC. Will continue to monitor until DC from unit.

## 2014-11-11 NOTE — Progress Notes (Signed)
CARE MANAGEMENT NOTE 11/11/2014  Patient:  Mark Roberson,Mark Roberson   Account Number:  [id number]  Date Initiated:  11/09/2014  Documentation initiated by:  Ferdinand CavaSCHETTINO,Kwanza Cancelliere  Subjective/Objective Assessment:   69 yo male admitted with shortness of breath     Action/Plan:   dsicharge planning   Anticipated DC Date:  11/10/2014   Anticipated DC Plan:  HOME W HOME HEALTH SERVICES      DC Planning Services  CM consult      [facility]  DURABLE MEDICAL EQUIPMENT   Choice offered to / List presented to:  C-1 Patient   DME arranged  3-N-1  HOSPITAL BED  NEBULIZER MACHINE      DME agency  [facility]     HH arranged  HH-1 RN  HH-2 PT  HH-3 OT  HH-6 SOCIAL WORKER  HH-4 NURSE'S AIDE      HH agency  [facility]   Status of service:  Completed, signed off Medicare Important Message given?  YES (If response is "NO", the following Medicare IM given date fields will be blank) Date Medicare IM given:  11/11/2014 Medicare IM given by:  Ferdinand CavaSCHETTINO,Riddhi Grether Date Additional Medicare IM given:   Additional Medicare IM given by:    Discharge Disposition:  HOME W HOME HEALTH SERVICES  Per UR Regulation:    If discussed at Long Length of Stay Meetings, dates discussed:    Comments:  11/11/14 Ferdinand CavaAndrea Schettino RN BSN [phone] D[facility] and [facility]. Patient Niece, Mark Roberson, has been updated. No further needs.  11/09/14 Ferdinand CavaAndrea Schettino RN BSn CM [phone] Patient stated that he moved ot [place] on Saturday because he does not have any support in [place]. He stated that he has been in and out of the hospital and SNF's in [place] for the past 3-4 months. He is staying with a friend and plans on discharging and staying with his friend in [place]. Spoke with Mark Roberson, patient friend, and she stated that the patient has been SOB and needing a lot of assist in the home. [facility], Mark Roberson also discussed the need for  RN, SW, aide upon discharge. Kelly requested a hosptial bed and a 3n1 BSC and neb machine has already been ordered. Will continue to follow if patient qualifies for home O2.

## 2014-11-14 ENCOUNTER — Ambulatory Visit (INDEPENDENT_AMBULATORY_CARE_PROVIDER_SITE_OTHER): Payer: Medicare Other | Admitting: Internal Medicine

## 2014-11-14 ENCOUNTER — Encounter: Payer: Self-pay | Admitting: Internal Medicine

## 2014-11-14 ENCOUNTER — Other Ambulatory Visit (INDEPENDENT_AMBULATORY_CARE_PROVIDER_SITE_OTHER): Payer: Medicare Other

## 2014-11-14 VITALS — BP 110/60 | HR 78 | Temp 98.4°F | Resp 12 | Ht 72.0 in | Wt 336.4 lb

## 2014-11-14 DIAGNOSIS — R0902 Hypoxemia: Secondary | ICD-10-CM

## 2014-11-14 DIAGNOSIS — I5032 Chronic diastolic (congestive) heart failure: Secondary | ICD-10-CM

## 2014-11-14 DIAGNOSIS — E118 Type 2 diabetes mellitus with unspecified complications: Secondary | ICD-10-CM

## 2014-11-14 DIAGNOSIS — I4892 Unspecified atrial flutter: Secondary | ICD-10-CM | POA: Diagnosis not present

## 2014-11-14 DIAGNOSIS — L8990 Pressure ulcer of unspecified site, unspecified stage: Secondary | ICD-10-CM

## 2014-11-14 DIAGNOSIS — E0821 Diabetes mellitus due to underlying condition with diabetic nephropathy: Secondary | ICD-10-CM

## 2014-11-14 LAB — HEMOGLOBIN A1C: Hgb A1c MFr Bld: 6.6 % — ABNORMAL HIGH (ref 4.6–6.5)

## 2014-11-14 MED ORDER — ALBUTEROL SULFATE (2.5 MG/3ML) 0.083% IN NEBU: 2.5000 mg | INHALATION_SOLUTION | Freq: Four times a day (QID) | RESPIRATORY_TRACT | Status: DC | PRN

## 2014-11-14 MED ORDER — "TEGADERM + PAD 3-1/2""X6"" MISC": Status: DC

## 2014-11-14 MED ORDER — WHEELCHAIR CUSHION MISC: Status: DC

## 2014-11-14 MED ORDER — SILVER SULFADIAZINE 1 % EX CREA: 1.0000 "application " | TOPICAL_CREAM | Freq: Every day | CUTANEOUS | Status: DC

## 2014-11-14 NOTE — Progress Notes (Signed)
Pre visit review using our clinic review tool, if applicable. No additional management support is needed unless otherwise documented below in the visit note. 

## 2014-11-14 NOTE — Patient Instructions (Signed)
We will check the labs today. Please go downstairs to the basement for these.   We have given you the prescription for the wheelchair cushion.   We have sent in for the sleep study which can be done in your house.   We have sent in for the albuterol and the wound supplies and the aid.   We will see you back in about 1 month to check on you. If the fluid pill is not working we can you to call our office for advice.   Low-Sodium Eating Plan Sodium raises blood pressure and causes water to be held in the body. Getting less sodium from food will help lower your blood pressure, reduce any swelling, and protect your heart, liver, and kidneys. We get sodium by adding salt (sodium chloride) to food. Most of our sodium comes from canned, boxed, and frozen foods. Restaurant foods, fast foods, and pizza are also very high in sodium. Even if you take medicine to lower your blood pressure or to reduce fluid in your body, getting less sodium from your food is important. WHAT IS MY PLAN? Most people should limit their sodium intake to 2,300 mg a day. Your health care provider recommends that you limit your sodium intake to __________ a day.  WHAT DO I NEED TO KNOW ABOUT THIS EATING PLAN? For the low-sodium eating plan, you will follow these general guidelines:  Choose foods with a % Daily Value for sodium of less than 5% (as listed on the food label).   Use salt-free seasonings or herbs instead of table salt or sea salt.   Check with your health care provider or pharmacist before using salt substitutes.   Eat fresh foods.  Eat more vegetables and fruits.  Limit canned vegetables. If you do use them, rinse them well to decrease the sodium.   Limit cheese to 1 oz (28 g) per day.   Eat lower-sodium products, often labeled as "lower sodium" or "no salt added."  Avoid foods that contain monosodium glutamate (MSG). MSG is sometimes added to Cong[place]inese food and some canned foods.  Check food labels  (Nutrition Facts labels) on foods to learn how much sodium is in one serving.  Eat more home-cooked food and less restaurant, buffet, and fast food.  When eating at a restaurant, ask that your food be prepared with less salt or none, if possible.  HOW DO I READ FOOD LABELS FOR SODIUM INFORMATION? The Nutrition Facts label lists the amount of sodium in one serving of the food. If you eat more than one serving, you must multiply the listed amount of sodium by the number of servings. Food labels may also identify foods as:  Sodium free--Less than 5 mg in a serving.  Very low sodium--35 mg or less in a serving.  Low sodium--140 mg or less in a serving.  Light in sodium--50% less sodium in a serving. For example, if a food that usually has 300 mg of sodium is changed to become light in sodium, it will have 150 mg of sodium.  Reduced sodium--25% less sodium in a serving. For example, if a food that usually has 400 mg of sodium is changed to reduced sodium, it will have 300 mg of sodium. WHAT FOODS CAN I EAT? Grains Low-sodium cereals, including oats, puffed wheat and rice, and shredded wheat cereals. Low-sodium crackers. Unsalted rice and pasta. Lower-sodium bread.  Vegetables Frozen or fresh vegetables. Low-sodium or reduced-sodium canned vegetables. Low-sodium or reduced-sodium tomato sauce and paste.  Low-sodium or reduced-sodium tomato and vegetable juices.  Fruits Fresh, frozen, and canned fruit. Fruit juice.  Meat and Other Protein Products Low-sodium canned tuna and salmon. Fresh or frozen meat, poultry, seafood, and fish. Lamb. Unsalted nuts. Dried beans, peas, and lentils without added salt. Unsalted canned beans. Homemade soups without salt. Eggs.  Dairy Milk. Soy milk. Ricotta cheese. Low-sodium or reduced-sodium cheeses. Yogurt.  Condiments Fresh and dried herbs and spices. Salt-free seasonings. Onion and garlic powders. Low-sodium varieties of mustard and ketchup. Lemon  juice.  Fats and Oils Reduced-sodium salad dressings. Unsalted butter.  Other Unsalted popcorn and pretzels.  The items listed above may not be a complete list of recommended foods or beverages. Contact your dietitian for more options. WHAT FOODS ARE NOT RECOMMENDED? Grains Instant hot cereals. Bread stuffing, pancake, and biscuit mixes. Croutons. Seasoned rice or pasta mixes. Noodle soup cups. Boxed or frozen macaroni and cheese. Self-rising flour. Regular salted crackers. Vegetables Regular canned vegetables. Regular canned tomato sauce and paste. Regular tomato and vegetable juices. Frozen vegetables in sauces. Salted french fries. Olives. Rosita Fire. Relishes. Sauerkraut. Salsa. Meat and Other Protein Products Salted, canned, smoked, spiced, or pickled meats, seafood, or fish. Bacon, ham, sausage, hot dogs, corned beef, chipped beef, and packaged luncheon meats. Salt pork. Jerky. Pickled herring. Anchovies, regular canned tuna, and sardines. Salted nuts. Dairy Processed cheese and cheese spreads. Cheese curds. Blue cheese and cottage cheese. Buttermilk.  Condiments Onion and garlic salt, seasoned salt, table salt, and sea salt. Canned and packaged gravies. Worcestershire sauce. Tartar sauce. Barbecue sauce. Teriyaki sauce. Soy sauce, including reduced sodium. Steak sauce. Fish sauce. Oyster sauce. Cocktail sauce. Horseradish. Regular ketchup and mustard. Meat flavorings and tenderizers. Bouillon cubes. Hot sauce. Tabasco sauce. Marinades. Taco seasonings. Relishes. Fats and Oils Regular salad dressings. Salted butter. Margarine. Ghee. Bacon fat.  Other Potato and tortilla chips. Corn chips and puffs. Salted popcorn and pretzels. Canned or dried soups. Pizza. Frozen entrees and pot pies.  The items listed above may not be a complete list of foods and beverages to avoid. Contact your dietitian for more information. Document Released: 03/01/2002 Document Revised: 09/14/2013 Document  Reviewed: 07/14/2013 [facility] Patient Information 2015 [place], [place]. This information is not intended to replace advice given to you by your health care provider. Make sure you discuss any questions you have with your health care provider.

## 2014-11-15 LAB — BASIC METABOLIC PANEL
BUN: 37 mg/dL — ABNORMAL HIGH (ref 6–23)
CO2: 31 meq/L (ref 19–32)
Calcium: 9.1 mg/dL (ref 8.4–10.5)
Chloride: 100 mEq/L (ref 96–112)
Creatinine, Ser: 1.37 mg/dL (ref 0.40–1.50)
GFR: 54.83 mL/min — ABNORMAL LOW (ref >60.00)
GLUCOSE: 456 mg/dL — AB (ref 70–99)
Potassium: 4.5 mEq/L (ref 3.5–5.1)
SODIUM: 138 meq/L (ref 135–145)

## 2014-11-15 LAB — LIPID PANEL
CHOL/HDL RATIO: 4
CHOLESTEROL: 113 mg/dL (ref 0–200)
HDL: 30.1 mg/dL — ABNORMAL LOW (ref >39.00)
LDL CALC: 64 mg/dL (ref 0–99)
NonHDL: 82.9
Triglycerides: 97 mg/dL (ref 0.0–149.0)
VLDL: 19.4 mg/dL (ref 0.0–40.0)

## 2014-11-17 ENCOUNTER — Telehealth: Payer: Self-pay | Admitting: Internal Medicine

## 2014-11-17 ENCOUNTER — Encounter: Payer: Self-pay | Admitting: Internal Medicine

## 2014-11-17 NOTE — Assessment & Plan Note (Addendum)
Check HgA1c, he is taking 25 units TID with meals and lantus 66 units at night time. He is also taking [place]uvia as well. Foot exam done today. Per patient has not ever been well controlled but better lately. Is complicated by kidney injury.

## 2014-11-17 NOTE — Assessment & Plan Note (Signed)
Sounds regular today, anticoagulated. On beta blocker for rate control.

## 2014-11-17 NOTE — Telephone Encounter (Signed)
Is requesting call back with results of A1C.

## 2014-11-17 NOTE — Assessment & Plan Note (Signed)
Unable to stage as he did not let me examine it today.

## 2014-11-17 NOTE — Assessment & Plan Note (Signed)
He does claim to be hypoxic at night time and informed him that we could not order night time oxygen as likely his problem is OSA and will arrange home sleep study.

## 2014-11-17 NOTE — Progress Notes (Addendum)
   Subjective:    Patient ID: Mark GoldsMichael Roberson, male    DOB: [birth year], 69 y.o.   MRN: [id number]  HPI The patient is a 69 YO man who is coming in for a hospital follow up as a new patient. He has recently moved from [place] and ended up in the hospital for acute diastolic heart failure (hospital notes reviewed, was diuresed and wound on buttock treated, sugars controlled during stay, discharged with home OT/PT). He is here today with his friend who is helping to take care of him. He has not been following his low sodium diet well (ate chinese food the last 2 days). He is having minimal swelling in his ankles but no SOB. He is up about 2 pounds since leaving the hospital. He is working with PT but he is very weak and not able to get around on his own. They have been caring for his wound and think it seems to be healing. He is taking his medicines he thinks how he is supposed to. He denies fevers, chills. Overall he feels his health is poor. His sugars have been okay since leaving and he wants to have his HgA1c checked today.   Review of Systems  Constitutional: Positive for activity change. Negative for fever, chills, appetite change, fatigue and unexpected weight change.  HENT: Negative.   Eyes: Negative.   Respiratory: Negative for cough, chest tightness, shortness of breath and wheezing.   Cardiovascular: Positive for leg swelling. Negative for chest pain and palpitations.  Gastrointestinal: Negative for nausea, abdominal pain, diarrhea and abdominal distention.  Musculoskeletal: Positive for arthralgias and gait problem.  Skin: Positive for wound.  Neurological: Positive for weakness. Negative for dizziness and light-headedness.      Objective:   Physical Exam  Constitutional: He is oriented to person, place, and time. He appears well-developed.  Morbidly obese  HENT:  Head: Normocephalic and atraumatic.  Eyes: EOM are normal.  Neck: Normal range of motion.  Cardiovascular: Normal rate.     Pulmonary/Chest: Effort normal. No respiratory distress. He has no wheezes. He has no rales.  Abdominal: Soft. Bowel sounds are normal. He exhibits no distension. There is no tenderness. There is no rebound.  Musculoskeletal:  1+ edema to the shins bilaterally  Neurological: He is alert and oriented to person, place, and time. Coordination normal.  Skin: Skin is warm and dry.  Wound on buttock, he declines to let me see today due to the difficulty in maneuvering  Psychiatric: He has a normal mood and affect. His behavior is normal.   Filed Vitals:   11/14/14 1610  BP: 110/60  Pulse: 78  Temp: 98.4 F (36.9 C)  TempSrc: Oral  Resp: 12  Height: 6' (1.829 m)  Weight: 336 lb 6.4 oz (152.59 kg)  SpO2: 94%      Assessment & Plan:  Patient declines immunizations.   Visit time today 60 minutes of which greater than 50% of the time was spent face to face in counseling and coordination of care. Patient was counseled about his weight and the fact that he likely does not qualify for oxygen at night time without further testing. We did also spend an extensive amount of time trying to clarify what tests and problems were figured out during his hospital stays in [place].

## 2014-11-17 NOTE — Telephone Encounter (Signed)
Checking on an order faxed over twice this week for a pressure reducing cushion.  Also needs office notes with that.

## 2014-11-17 NOTE — Assessment & Plan Note (Signed)
Reinforced the need for low sodium diet as that will increase the fluid. He will continue with his diuresis at home. If going up 2+ pounds will call for advice on medicines. He is on ACE-I, statin, beta blocker.

## 2014-11-17 NOTE — Telephone Encounter (Signed)
Dr. Dorise HissKollar, have you seen these orders?

## 2014-11-21 ENCOUNTER — Telehealth: Payer: Self-pay

## 2014-11-21 NOTE — Telephone Encounter (Signed)
[facility] requested lov notes for pt.   [phone]

## 2014-11-22 DIAGNOSIS — J9601 Acute respiratory failure with hypoxia: Secondary | ICD-10-CM

## 2014-11-23 ENCOUNTER — Telehealth: Payer: Self-pay | Admitting: Internal Medicine

## 2014-11-23 NOTE — Telephone Encounter (Signed)
Increase the daily lantus dose by 10 units and give him a dose of that now

## 2014-11-23 NOTE — Telephone Encounter (Signed)
Patient takes Novolog, Lantus, and Januvia. Please advise, thanks.

## 2014-11-23 NOTE — Telephone Encounter (Signed)
[facility] [phone]  Blood sugar has been running in the 200's Pt is having numbness of hands going up though wrist into mid arm.

## 2014-11-23 NOTE — Telephone Encounter (Signed)
Tried to call Donita back, no answer. I called the patient at home and informed him to increase his daily dose of lantus by 10 units and to give himself a dose right now. He said he would.

## 2014-12-02 ENCOUNTER — Telehealth: Payer: Self-pay | Admitting: Internal Medicine

## 2014-12-02 MED ORDER — FLUTICASONE PROPIONATE 50 MCG/ACT NA SUSP: 2.0000 | Freq: Every day | NASAL | Status: DC

## 2014-12-02 NOTE — Telephone Encounter (Signed)
Left message on voice mail informing patient that he has a prescription at the pharmacy to pick up and if he is not better next week call for an appt.

## 2014-12-02 NOTE — Telephone Encounter (Signed)
Would you like to call this in without evaluation? Please advise, thanks.

## 2014-12-02 NOTE — Telephone Encounter (Signed)
Patient is requesting order for ZPak.  Patient is coughing and sneezing, clear nasal drainage, no pain in ears and no fever.  States patient feels he is going to get infection b/c he states that always happens to him.

## 2014-12-02 NOTE — Telephone Encounter (Signed)
Rx for flonase sent in. 2 puffs in each nostril daily. If not better next week can be evaluated.

## 2014-12-02 NOTE — Telephone Encounter (Signed)
[facility] called in said that he cancelled OT today because he has a sinus infection

## 2014-12-06 NOTE — Telephone Encounter (Signed)
MRI Cervical report mailed to patient as requested on voicemail.  [address] [place], [place] [address]

## 2014-12-07 ENCOUNTER — Other Ambulatory Visit: Payer: Self-pay | Admitting: Internal Medicine

## 2014-12-12 ENCOUNTER — Ambulatory Visit: Payer: Medicare Other | Admitting: Internal Medicine

## 2014-12-15 ENCOUNTER — Ambulatory Visit: Payer: Medicare Other | Admitting: Internal Medicine

## 2014-12-19 ENCOUNTER — Other Ambulatory Visit: Payer: Self-pay | Admitting: Internal Medicine

## 2014-12-22 ENCOUNTER — Telehealth: Payer: Self-pay | Admitting: Internal Medicine

## 2014-12-22 NOTE — Telephone Encounter (Signed)
Verbal order to extend home health physical therapy for 3 more weeks.

## 2014-12-23 ENCOUNTER — Telehealth: Payer: Self-pay | Admitting: Internal Medicine

## 2014-12-23 NOTE — Telephone Encounter (Signed)
[facility] for [facility] more weeks.

## 2014-12-23 NOTE — Telephone Encounter (Signed)
Donita is calling to request verbal orders for OT

## 2014-12-23 NOTE — Telephone Encounter (Signed)
Spoke and gave verbal ok for OT.

## 2014-12-27 ENCOUNTER — Ambulatory Visit (INDEPENDENT_AMBULATORY_CARE_PROVIDER_SITE_OTHER): Payer: Medicare Other | Admitting: Internal Medicine

## 2014-12-27 ENCOUNTER — Other Ambulatory Visit (INDEPENDENT_AMBULATORY_CARE_PROVIDER_SITE_OTHER): Payer: Medicare Other

## 2014-12-27 ENCOUNTER — Encounter: Payer: Self-pay | Admitting: Internal Medicine

## 2014-12-27 VITALS — BP 142/68 | HR 74 | Temp 98.0°F | Resp 18 | Wt 332.8 lb

## 2014-12-27 DIAGNOSIS — I10 Essential (primary) hypertension: Secondary | ICD-10-CM

## 2014-12-27 DIAGNOSIS — L8990 Pressure ulcer of unspecified site, unspecified stage: Secondary | ICD-10-CM | POA: Diagnosis not present

## 2014-12-27 DIAGNOSIS — R208 Other disturbances of skin sensation: Secondary | ICD-10-CM | POA: Diagnosis not present

## 2014-12-27 DIAGNOSIS — R200 Anesthesia of skin: Secondary | ICD-10-CM

## 2014-12-27 LAB — BASIC METABOLIC PANEL
BUN: 39 mg/dL — AB (ref 6–23)
CALCIUM: 10 mg/dL (ref 8.4–10.5)
CHLORIDE: 97 meq/L (ref 96–112)
CO2: 32 meq/L (ref 19–32)
CREATININE: 1.49 mg/dL (ref 0.40–1.50)
GFR: 49.75 mL/min — ABNORMAL LOW (ref >60.00)
GLUCOSE: 139 mg/dL — AB (ref 70–99)
Potassium: 4.8 mEq/L (ref 3.5–5.1)
Sodium: 135 mEq/L (ref 135–145)

## 2014-12-27 MED ORDER — LISINOPRIL 10 MG PO TABS: 10.0000 mg | ORAL_TABLET | Freq: Every day | ORAL | Status: DC

## 2014-12-27 NOTE — Progress Notes (Signed)
Pre visit review using our clinic review tool, if applicable. No additional management support is needed unless otherwise documented below in the visit note. 

## 2014-12-27 NOTE — Patient Instructions (Signed)
The wound on your bottom is fully healed and keeping it clean is important. Make sure not to be sitting in the same position or long car rides to avoid re-injury.  Keep working on keeping the sugars good and working on exercising to build back up the strength.   Diabetes, Eating Away From Home Sometimes, you might eat in a restaurant or have meals that are prepared by someone else. You can enjoy eating out. However, the portions in restaurants may be much larger than needed. Listed below are some ideas to help you choose foods that will keep your blood glucose (sugar) in better control.  TIPS FOR EATING OUT  Know your meal plan and how many carbohydrate servings you should have at each meal. You may wish to carry a copy of your meal plan in your purse or wallet. Learn the foods included in each food group.  Make a list of restaurants near you that offer healthy choices. Take a copy of the carry-out menus to see what they offer. Then, you can plan what you will order ahead of time.  Become familiar with serving sizes by practicing them at home using measuring cups and spoons. Once you learn to recognize portion sizes, you will be able to correctly estimate the amount of total carbohydrate you are allowed to eat at the restaurant. Ask for a takeout box if the portion is more than you should have. When your food comes, leave the amount you should have on the plate, and put the rest in the takeout box before you start eating.  Plan ahead if your mealtime will be different from usual. Check with your caregiver to find out how to time meals and medicine if you are taking insulin.  Avoid high-fat foods, such as fried foods, cream sauces, high-fat salad dressings, or any added butter or margarine.  Do not be afraid to ask questions. Ask your server about the portion size, cooking methods, ingredients and if items can be substituted. Restaurants do not list all available items on the menu. You can ask for  your main entree to be prepared using skim milk, oil instead of butter or margarine, and without gravy or sauces. Ask your waiter or waitress to serve salad dressings, gravy, sauces, margarine, and sour cream on the side. You can then add the amount your meal plan suggests.  Add more vegetables whenever possible.  Avoid items that are labeled "jumbo," "giant," "deluxe," or "supersized."  You may want to split an entre with someone and order an extra side salad.  Watch for hidden calories in foods like croutons, bacon, or cheese.  Ask your server to take away the bread basket or chips from your table.  Order a dinner salad as an appetizer. You can eat most foods served in a restaurant. Some foods are better choices than others. Breads and Starches  Recommended: All kinds of bread (wheat, rye, white, oatmeal, [place], [place], raisin), hard or soft dinner rolls, frankfurter or hamburger buns, small bagels, small corn or whole-wheat flour tortillas.  Avoid: Frosted or glazed breads, butter rolls, egg or cheese breads, croissants, sweet rolls, pastries, coffee cake, glazed or frosted doughnuts, muffins. Crackers  Recommended: Animal crackers, graham, rye, saltine, oyster, and matzoth crackers. Bread sticks, melba toast, rusks, pretzels, popcorn (without fat), zwieback toast.  Avoid: High-fat snack crackers or chips. Buttered popcorn. Cereals  Recommended: Hot and cold cereals. Whole grains such as oatmeal or shredded wheat are good choices.  Avoid: Sugar-coated or granola  type cereals. Potatoes/Pasta/Rice/Beans  Recommended: Order baked, boiled, or mashed potatoes, rice or noodles without added fat, whole beans. Order gravies, butter, margarine, or sauces on the side so you can control the amount you add.  Avoid: Hash browns or fried potatoes. Potatoes, pasta, or rice prepared with cream or cheese sauce. Potato or pasta salads prepared with large amounts of dressing. Fried beans or fried  rice. Vegetables  Recommended: Order steamed, baked, boiled, or stewed vegetables without sauces or extra fat. Ask that sauce be served on the side. If vegetables are not listed on the menu, ask what is available.  Avoid: Vegetables prepared with cream, butter, or cheese sauce. Fried vegetables. Salad Bars  Recommended: Many of the vegetables at a salad bar are considered "free." Use lemon juice, vinegar, or low-calorie salad dressing (fewer than 20 calories per serving) as "free" dressings for your salad. Look for salad bar ingredients that have no added fat or sugar such as tomatoes, lettuce, cucumbers, broccoli, carrots, onions, and mushrooms.  Avoid: Prepared salads with large amounts of dressing, such as coleslaw, caesar salad, macaroni salad, bean salad, or carrot salad. Fruit  Recommended: Eat fresh fruit or fresh fruit salad without added dressing. A salad bar often offers fresh fruit choices, but canned fruit at a restaurant is usually packed in sugar or syrup.  Avoid: Sweetened canned or frozen fruits, plain or sweetened fruit juice. Fruit salads with dressing, sour cream, or sugar added to them. Meat and Meat Substitutes  Recommended: Order broiled, baked, roasted, or grilled meat, poultry, or fish. Trim off all visible fat. Do not eat the skin of poultry. The size stated on the menu is the raw weight. Meat shrinks by  in cooking (for example, 4 oz raw equals 3 oz cooked meat).  Avoid: Deep-fat fried meat, poultry, or fish. Breaded meats. Eggs  Recommended: Order soft, hard-cooked, poached, or scrambled eggs. Omelets may be okay, depending on what ingredients are added. Egg substitutes are also a good choice.  Avoid: Fried eggs, eggs prepared with cream or cheese sauce. Milk  Recommended: Order low-fat or fat-free milk according to your meal plan. Plain, nonfat yogurt or flavored yogurt with no sugar added may be used as a substitute for milk. Soy milk may also be  used.  Avoid: Milk shakes or sweetened milk beverages. Soups and Combination Foods  Recommended: Clear broth or consomm are "free" foods and may be used as an appetizer. Broth-based soups with fat removed count as a starch serving and are preferred over cream soups. Soups made with beans or split peas may be eaten but count as a starch.  Avoid: Fatty soups, soup made with cream, cheese soup. Combination foods prepared with excessive amounts of fat or with cream or cheese sauces. Desserts and Sweets  Recommended: Ask for fresh fruit. Sponge or angel food cake without icing, ice milk, no sugar added ice cream, sherbet, or frozen yogurt may fit into your meal plan occasionally.  Avoid: Pastries, puddings, pies, cakes with icing, custard, gelatin desserts. Fats and Oils  Recommended: Choose healthy fats such as olive oil, canola oil, or tub margarine, reduced fat or fat-free sour cream, cream cheese, avocado, or nuts.  Avoid: Any fats in excess of your allowed portion. Deep-fried foods or any food with a large amount of fat. Note: Ask for all fats to be served on the side, and limit your portion sizes according to your meal plan. Document Released: 09/09/2005 Document Revised: 12/02/2011 Document Reviewed: 12/07/2013 [employer] Patient  Information 2015 [employer]. This information is not intended to replace advice given to you by your health care provider. Make sure you discuss any questions you have with your health care provider.  

## 2014-12-29 NOTE — Assessment & Plan Note (Signed)
Is down about 4 pounds since last visit. He has not been working on diet or exercise. Talked to him about exercise and improving his strength. He knows that it is important. They are into a routine with food and talked to them about healthy eating. Offered nutrition consult but they do not feel they need it today.

## 2014-12-29 NOTE — Assessment & Plan Note (Signed)
Completely resolved on exam today and will resolve problem. Advised continued weight loss and moving around to avoid continued pressure on the area.

## 2014-12-29 NOTE — Progress Notes (Signed)
   Subjective:    Patient ID: Mark Roberson, male    DOB: Jul 07, 1946, 69 y.o.   MRN: [id number]  HPI  The patient is a 69 YO man who is coming in to follow up on his wound on his buttocks. His friend that he stays with has been helping him bandage it. He is working to keep it clean and he thinks it is well healed. Did not bring his sugar log with him today. Has lost about 4 pounds and is still keeping up with his fluid pill at home. His eating is mediocre at best and he does not tend to eat healthy most of the time.   Review of Systems  Constitutional: Negative for fever, chills, appetite change, fatigue and unexpected weight change.  HENT: Negative.   Eyes: Negative.   Respiratory: Negative for cough, chest tightness, shortness of breath and wheezing.   Cardiovascular: Negative for chest pain, palpitations and leg swelling.  Gastrointestinal: Negative for nausea, abdominal pain, diarrhea and abdominal distention.  Musculoskeletal: Positive for arthralgias and gait problem.  Skin: Negative for wound.  Neurological: Positive for weakness. Negative for dizziness and light-headedness.      Objective:   Physical Exam  Constitutional: He is oriented to person, place, and time. He appears well-developed.  Morbidly obese  HENT:  Head: Normocephalic and atraumatic.  Eyes: EOM are normal.  Neck: Normal range of motion.  Cardiovascular: Normal rate.   Pulmonary/Chest: Effort normal. No respiratory distress. He has no wheezes. He has no rales.  Abdominal: Soft. Bowel sounds are normal. He exhibits no distension. There is no tenderness. There is no rebound.  Musculoskeletal: He exhibits no edema.  Neurological: He is alert and oriented to person, place, and time. Coordination normal.  Using walker today  Skin: Skin is warm and dry.  Wound on buttock fully healed, no signs of fluctuance below the surface and fully intact skin  Psychiatric: He has a normal mood and affect. His behavior is  normal.   Filed Vitals:   12/27/14 1558  BP: 142/68  Pulse: 74  Temp: 98 F (36.7 C)  TempSrc: Oral  Resp: 18  Weight: 332 lb 12.8 oz (150.957 kg)  SpO2: 94%      Assessment & Plan:

## 2015-01-05 ENCOUNTER — Telehealth: Payer: Self-pay | Admitting: Internal Medicine

## 2015-01-05 NOTE — Telephone Encounter (Signed)
Patient's caregiver is aware 

## 2015-01-05 NOTE — Telephone Encounter (Signed)
Patient's caregiver, Brayton MarsRhaieta, called regarding referral to wound care center. She states the wound is healed, but it is very rough and she would like someone to look at it. Please enter new referral to wound care if you feel it's appropriate. Thank you.

## 2015-01-05 NOTE — Telephone Encounter (Signed)
Patient calling regarding labs that were done last week. Please call patient

## 2015-01-05 NOTE — Telephone Encounter (Signed)
I looked at the area on the 5th and he does not need wound clinic at this time.

## 2015-01-06 NOTE — Telephone Encounter (Signed)
Left message for patient to call me back. 

## 2015-01-09 NOTE — Telephone Encounter (Signed)
Pt called back   Best number [phone]

## 2015-01-09 NOTE — Telephone Encounter (Signed)
Spoke with patient about lab results.

## 2015-01-11 ENCOUNTER — Inpatient Hospital Stay (HOSPITAL_COMMUNITY)
Admission: EM | Admit: 2015-01-11 | Discharge: 2015-01-18 | DRG: 981 | Disposition: A | Discharge status: Home / Self Care | Payer: Medicare Other | Attending: Internal Medicine | Admitting: Internal Medicine

## 2015-01-11 ENCOUNTER — Emergency Department (HOSPITAL_COMMUNITY): Payer: Medicare Other

## 2015-01-11 ENCOUNTER — Encounter (HOSPITAL_COMMUNITY): Payer: Self-pay | Admitting: *Deleted

## 2015-01-11 ENCOUNTER — Telehealth: Payer: Self-pay | Admitting: Internal Medicine

## 2015-01-11 DIAGNOSIS — L03211 Cellulitis of face: Secondary | ICD-10-CM | POA: Diagnosis present

## 2015-01-11 DIAGNOSIS — Z9119 Patient's noncompliance with other medical treatment and regimen: Secondary | ICD-10-CM | POA: Diagnosis present

## 2015-01-11 DIAGNOSIS — K029 Dental caries, unspecified: Secondary | ICD-10-CM | POA: Diagnosis present

## 2015-01-11 DIAGNOSIS — Z880 Allergy status to penicillin: Secondary | ICD-10-CM

## 2015-01-11 DIAGNOSIS — E875 Hyperkalemia: Secondary | ICD-10-CM | POA: Diagnosis present

## 2015-01-11 DIAGNOSIS — I130 Hypertensive heart and chronic kidney disease with heart failure and stage 1 through stage 4 chronic kidney disease, or unspecified chronic kidney disease: Secondary | ICD-10-CM | POA: Diagnosis not present

## 2015-01-11 DIAGNOSIS — Z7901 Long term (current) use of anticoagulants: Secondary | ICD-10-CM

## 2015-01-11 DIAGNOSIS — R0602 Shortness of breath: Secondary | ICD-10-CM

## 2015-01-11 DIAGNOSIS — I10 Essential (primary) hypertension: Secondary | ICD-10-CM | POA: Diagnosis present

## 2015-01-11 DIAGNOSIS — I5033 Acute on chronic diastolic (congestive) heart failure: Secondary | ICD-10-CM | POA: Diagnosis not present

## 2015-01-11 DIAGNOSIS — N179 Acute kidney failure, unspecified: Secondary | ICD-10-CM | POA: Diagnosis not present

## 2015-01-11 DIAGNOSIS — M264 Malocclusion, unspecified: Secondary | ICD-10-CM | POA: Diagnosis present

## 2015-01-11 DIAGNOSIS — J45909 Unspecified asthma, uncomplicated: Secondary | ICD-10-CM | POA: Diagnosis present

## 2015-01-11 DIAGNOSIS — I959 Hypotension, unspecified: Secondary | ICD-10-CM | POA: Diagnosis present

## 2015-01-11 DIAGNOSIS — I4891 Unspecified atrial fibrillation: Secondary | ICD-10-CM | POA: Diagnosis present

## 2015-01-11 DIAGNOSIS — R220 Localized swelling, mass and lump, head: Secondary | ICD-10-CM

## 2015-01-11 DIAGNOSIS — D62 Acute posthemorrhagic anemia: Secondary | ICD-10-CM | POA: Diagnosis present

## 2015-01-11 DIAGNOSIS — R040 Epistaxis: Secondary | ICD-10-CM

## 2015-01-11 DIAGNOSIS — R600 Localized edema: Secondary | ICD-10-CM | POA: Diagnosis present

## 2015-01-11 DIAGNOSIS — E1122 Type 2 diabetes mellitus with diabetic chronic kidney disease: Secondary | ICD-10-CM | POA: Diagnosis present

## 2015-01-11 DIAGNOSIS — I4892 Unspecified atrial flutter: Secondary | ICD-10-CM | POA: Diagnosis present

## 2015-01-11 DIAGNOSIS — N289 Disorder of kidney and ureter, unspecified: Secondary | ICD-10-CM

## 2015-01-11 DIAGNOSIS — R609 Edema, unspecified: Secondary | ICD-10-CM | POA: Diagnosis not present

## 2015-01-11 DIAGNOSIS — K011 Impacted teeth: Secondary | ICD-10-CM | POA: Diagnosis present

## 2015-01-11 DIAGNOSIS — G4733 Obstructive sleep apnea (adult) (pediatric): Secondary | ICD-10-CM | POA: Diagnosis present

## 2015-01-11 DIAGNOSIS — L8992 Pressure ulcer of unspecified site, stage 2: Secondary | ICD-10-CM | POA: Diagnosis present

## 2015-01-11 DIAGNOSIS — D509 Iron deficiency anemia, unspecified: Secondary | ICD-10-CM

## 2015-01-11 DIAGNOSIS — N183 Chronic kidney disease, stage 3 (moderate): Secondary | ICD-10-CM | POA: Diagnosis present

## 2015-01-11 DIAGNOSIS — Z96652 Presence of left artificial knee joint: Secondary | ICD-10-CM | POA: Diagnosis present

## 2015-01-11 DIAGNOSIS — Z885 Allergy status to narcotic agent status: Secondary | ICD-10-CM

## 2015-01-11 DIAGNOSIS — E1129 Type 2 diabetes mellitus with other diabetic kidney complication: Secondary | ICD-10-CM | POA: Insufficient documentation

## 2015-01-11 DIAGNOSIS — K045 Chronic apical periodontitis: Secondary | ICD-10-CM | POA: Diagnosis present

## 2015-01-11 DIAGNOSIS — J9601 Acute respiratory failure with hypoxia: Secondary | ICD-10-CM | POA: Diagnosis present

## 2015-01-11 DIAGNOSIS — K047 Periapical abscess without sinus: Secondary | ICD-10-CM

## 2015-01-11 DIAGNOSIS — L89312 Pressure ulcer of right buttock, stage 2: Secondary | ICD-10-CM | POA: Diagnosis present

## 2015-01-11 DIAGNOSIS — E0781 Sick-euthyroid syndrome: Secondary | ICD-10-CM | POA: Diagnosis present

## 2015-01-11 DIAGNOSIS — L0291 Cutaneous abscess, unspecified: Secondary | ICD-10-CM

## 2015-01-11 DIAGNOSIS — Z6841 Body Mass Index (BMI) 40.0 and over, adult: Secondary | ICD-10-CM

## 2015-01-11 LAB — I-STAT CHEM 8, ED
BUN: 85 mg/dL — ABNORMAL HIGH (ref 6–23)
CHLORIDE: 96 mmol/L (ref 96–112)
CREATININE: 2.2 mg/dL — AB (ref 0.50–1.35)
Calcium, Ion: 1.12 mmol/L — ABNORMAL LOW (ref 1.13–1.30)
GLUCOSE: 228 mg/dL — AB (ref 70–99)
HCT: 28 % — ABNORMAL LOW (ref 39.0–52.0)
Hemoglobin: 9.5 g/dL — ABNORMAL LOW (ref 13.0–17.0)
POTASSIUM: 5 mmol/L (ref 3.5–5.1)
Sodium: 133 mmol/L — ABNORMAL LOW (ref 135–145)
TCO2: 25 mmol/L (ref 0–100)

## 2015-01-11 LAB — CBC WITH DIFFERENTIAL/PLATELET
Basophils Absolute: 0 10*3/uL (ref 0.0–0.1)
Basophils Relative: 0 % (ref 0–1)
EOS ABS: 0.5 10*3/uL (ref 0.0–0.7)
EOS PCT: 5 % (ref 0–5)
HEMATOCRIT: 27.6 % — AB (ref 39.0–52.0)
Hemoglobin: 8.8 g/dL — ABNORMAL LOW (ref 13.0–17.0)
LYMPHS ABS: 1.5 10*3/uL (ref 0.7–4.0)
Lymphocytes Relative: 15 % (ref 12–46)
MCH: 28.8 pg (ref 26.0–34.0)
MCHC: 31.9 g/dL (ref 30.0–36.0)
MCV: 90.2 fL (ref 78.0–100.0)
MONO ABS: 1.3 10*3/uL — AB (ref 0.1–1.0)
MONOS PCT: 13 % — AB (ref 3–12)
Neutro Abs: 6.7 10*3/uL (ref 1.7–7.7)
Neutrophils Relative %: 67 % (ref 43–77)
Platelets: 185 10*3/uL (ref 150–400)
RBC: 3.06 MIL/uL — ABNORMAL LOW (ref 4.22–5.81)
RDW: 15.7 % — AB (ref 11.5–15.5)
WBC: 9.9 10*3/uL (ref 4.0–10.5)

## 2015-01-11 LAB — I-STAT TROPONIN, ED: TROPONIN I, POC: 0.02 ng/mL (ref 0.00–0.08)

## 2015-01-11 LAB — BRAIN NATRIURETIC PEPTIDE: B Natriuretic Peptide: 248 pg/mL — ABNORMAL HIGH (ref 0.0–100.0)

## 2015-01-11 MED ORDER — SODIUM CHLORIDE 0.9 % IV SOLN: Freq: Once | INTRAVENOUS | Status: DC

## 2015-01-11 MED ORDER — FUROSEMIDE 10 MG/ML IJ SOLN
40.0000 mg | INTRAMUSCULAR | Status: AC
  Administered 2015-01-11: 40 mg via INTRAVENOUS
  Filled 2015-01-11: qty 4

## 2015-01-11 NOTE — ED Notes (Signed)
Pt able to speak in full sentences. NAD noted at this time.

## 2015-01-11 NOTE — Telephone Encounter (Signed)
[place] SinkRita is his caregiver and has some of the same issues. Please give her a call also.

## 2015-01-11 NOTE — H&P (Signed)
PCP:   Judie BonusKollar, Elizabeth A, MD   Chief Complaint:  Mark RollsLe swelling  HPI: 69 yo male h/o MO, diastolic chf, afib, dm comes in with generalized weakness and increased le edema for several days.  Pt reports he normally swells then he goes into heart failure.  No sob, but figured he "get on top of it" so came to ED.  No fevers.  No chest pain.  Does not require oxygen supplementation at home chronically.  He was set up for sleep study in feb but did not do it because he feels he does not need a sleep study.  Reports 30lb wt gain in the last week.  Taking medications as told by his doctors.  Referred for admission for mild chf exacerbation.  Review of Systems:  Positive and negative as per HPI otherwise all other systems are negative  Past Medical History: Past Medical History  Diagnosis Date  . CHF (congestive heart failure)   . Atrial fibrillation   . Diabetes mellitus without complication   . Hypertension   . Sleep apnea   . Asthma    Past Surgical History  Procedure Laterality Date  . 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 Wound debridement Right     Medications: Prior to Admission medications   Medication Sig Start Date End Date Taking? Authorizing Provider  albuterol (PROVENTIL HFA;VENTOLIN HFA) 108 (90 BASE) MCG/ACT inhaler Inhale 2 puffs into the lungs every 6 (six) hours as needed for shortness of breath (shortness of breath).   Yes Historical Provider, MD  albuterol (PROVENTIL) (2.5 MG/3ML) 0.083% nebulizer solution Take 3 mLs (2.5 mg total) by nebulization every 6 (six) hours as needed for wheezing or shortness of breath. 11/14/14  Yes Judie BonusElizabeth A Kollar, MD  atorvastatin (LIPITOR) 20 MG tablet Take 20 mg by mouth at bedtime.    Yes Historical Provider, MD  carvedilol (COREG) 6.25 MG tablet Take 6.25 mg by mouth 2 (two) times daily with a meal.   Yes Historical Provider, MD  cholecalciferol (VITAMIN D) 1000 UNITS tablet Take 1,000 Units by mouth at bedtime.   Yes Historical  Provider, MD  fluticasone (FLONASE) 50 MCG/ACT nasal spray Place 2 sprays into both nostrils daily. Patient taking differently: Place 2 sprays into both nostrils daily. Take as needed for nasal congestion 12/02/14  Yes Judie BonusElizabeth A Kollar, MD  gabapentin (NEURONTIN) 300 MG capsule Take 300 mg by mouth 3 (three) times daily.   Yes Historical Provider, MD  HUMALOG KWIKPEN 100 UNIT/ML KiwkPen Inject 25 Units into the skin 3 (three) times daily.  12/19/14  Yes Historical Provider, MD  insulin glargine (LANTUS) 100 UNIT/ML injection Inject 66 Units into the skin at bedtime.   Yes Historical Provider, MD  lisinopril (PRINIVIL,ZESTRIL) 10 MG tablet Take 1 tablet (10 mg total) by mouth at bedtime. 12/27/14  Yes Judie BonusElizabeth A Kollar, MD  magnesium oxide (MAG-OX) 400 MG tablet Take 400 mg by mouth at bedtime.   Yes Historical Provider, MD  omega-3 acid ethyl esters (LOVAZA) 1 G capsule Take 2 g by mouth at bedtime.   Yes Historical Provider, MD  torsemide (DEMADEX) 20 MG tablet TAKE ONE TABLET BY MOUTH EVERY DAY 12/07/14  Yes Judie BonusElizabeth A Kollar, MD  XARELTO 20 MG TABS tablet TAKE ONE TABLET BY MOUTH ONCE DAILY 12/19/14  Yes Judie BonusElizabeth A Kollar, MD  Misc. Devices [facility](WHEELCHAIR CUSHION) MISC Use to cushion wheelchair 11/14/14   Judie BonusElizabeth A Kollar, MD  silver sulfADIAZINE (SILVADENE) 1 % cream  Apply 1 application topically daily. Patient not taking: Reported on 12/27/2014 11/14/14   [name], MD  Transparent Dressings (TEGADERM + PAD 3-1/2"X6") MISC Apply to wound daily 11/14/14   [name], MD    Allergies:   Allergies  Allergen Reactions  . Percocet [Oxycodone-Acetaminophen] Other (See Comments)    hallucination  . Penicillins Hives    Social History:  reports that he has never smoked. He has never used smokeless tobacco. He reports that he drinks alcohol. He reports that he does not use illicit drugs.  Family History: Family History  Problem Relation Age of Onset  . COPD Mother   . Heart  disease Father   . Cancer Sister     breast  . Heart disease Brother     Physical Exam: Filed Vitals:   01/11/15 2003 01/11/15 2015 01/11/15 2030 01/11/15 2045  BP: 113/43 120/53 115/75 122/31  Pulse: 60 60 59 57  Temp:      TempSrc:      Resp: Height:      SpO2: 100% 100% 100% 100%   General appearance: alert, cooperative and no distress Head: Normocephalic, without obvious abnormality, atraumatic Eyes: negative Nose: Nares normal. Septum midline. Mucosa normal. No drainage or sinus tenderness. Neck: no JVD and supple, symmetrical, trachea midline Lungs: clear to auscultation bilaterally Heart: regular rate and rhythm, S1, S2 normal, no murmur, click, rub or gallop Abdomen: soft, non-tender; bowel sounds normal; no masses,  no organomegaly Extremities: edema 3+ Pulses: 2+ and symmetric Skin: Skin color, texture, turgor normal. No rashes or lesions Neurologic: Grossly normal   Labs on Admission:   Recent Labs  01/11/15 1952  NA 133*  K 5.0  CL 96  GLUCOSE 228*  BUN 85*  CREATININE 2.20*    Recent Labs  01/11/15 1942 01/11/15 1952  WBC 9.9  --   NEUTROABS 6.7  --   HGB 8.8* 9.5*  HCT 27.6* 28.0*  MCV 90.2  --   PLT 185  --    Radiological Exams on Admission: Dg Chest Port 1 View  01/11/2015   CLINICAL DATA:  Shortness of Breath  EXAM: PORTABLE CHEST - 1 VIEW  COMPARISON:  11/08/2014  FINDINGS: Cardiomegaly in stable in appearance.  The right hemidiaphragm is elevated and stable. No focal infiltrate or sizable effusion is seen.  IMPRESSION: No acute abnormality noted.   Electronically Signed   By: Alcide Clever M.D.   On: 01/11/2015 20:39    Assessment/Plan  69 yo male with mild acute on chronic diastolic chf exacerbation  Principal Problem:   Acute on chronic diastolic CHF (congestive heart failure)-  chf pathway.  Just had recent echo will not repeat.  Lasix 40 mg iv daily and increase if renal function tolerates.  Pt NEEDS SLEEP  STUDY.  Active Problems:  Stable unless o/w noted   Morbid obesity-  He needs sleep study and advised this.  Explained untreated osa can worsen heart failure issues.   HTN (hypertension)-  noted   Atrial flutter-  Controlled and on xaralto   DM (diabetes mellitus)-  ssi   AKI (acute kidney injury)-  Bump to 2.2 from around baseline of 1.7.  Monitor.  obs on tele floor.  Full code.  Expected LOS 1-2 days.  Mark Roberson A 01/11/2015, 9:33 PM

## 2015-01-11 NOTE — ED Notes (Signed)
MD at bedside. 

## 2015-01-11 NOTE — ED Notes (Signed)
Portable xray at bedside.

## 2015-01-11 NOTE — Telephone Encounter (Signed)
[place] SinkRita called back to express concerns about patient gaining 30 pounds of fluid, and that they are waiting on verbal order. Advised that MD is gone for today. Advised that if she needs to take him to ED, then do so.

## 2015-01-11 NOTE — ED Notes (Signed)
Attempted report x1. 

## 2015-01-11 NOTE — Telephone Encounter (Signed)
Donita -[phone] [facility]   She is needs a few things .  Pt fell and she is needing verbal orders

## 2015-01-11 NOTE — ED Notes (Signed)
Unsuccessful IV attempt x2, additional RN to try.

## 2015-01-11 NOTE — ED Provider Notes (Signed)
CSN: [id number]     Arrival date & time 01/11/15  1816 History   First MD Initiated Contact with Patient 01/11/15 1823     Chief Complaint  Patient presents with  . Shortness of Breath  . Leg Swelling     (Consider location/radiation/quality/duration/timing/severity/associated sxs/prior Treatment) HPI Comments: The patient is a 69 year old male, he is morbidly obese, he reports a history of congestive heart failure prior to moving to [place] recently. He was recently admitted to the hospital for fluid overload in February 2016 during which time he was diuresed, his hypoxia improved, he had a CT scan of the chest showing no signs of pulmonary embolism or pneumonia. He reports that he has had a significant weight gain of almost 30 pounds over the last couple of weeks specifically since he went to the beach last weekend. His lower extremities are significantly swollen, he tried to ambulate earlier in the day and felt as though his legs gave out and required paramedic assistance to get back to a chair. This evening he felt more short of breath and reported bilateral arm and leg swelling, he reports that he "wanted to get ahead of it" before it gets too bad. No fevers chills or coughing. He does endorse compliance with his diuretics.  Patient is a 69 y.o. male presenting with shortness of breath. The history is provided by the patient.  Shortness of Breath   Past Medical History  Diagnosis Date  . CHF (congestive heart failure)   . Atrial fibrillation   . Diabetes mellitus without complication   . Hypertension   . Sleep apnea   . Asthma    Past Surgical History  Procedure Laterality Date  . 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 Wound debridement Right    Family History  Problem Relation Age of Onset  . COPD Mother   . Heart disease Father   . Cancer Sister     breast  . Heart disease Brother    History  Substance Use Topics  . Smoking status: Never Smoker   . Smokeless  tobacco: Never Used  . Alcohol Use: Yes     Comment: rare    Review of Systems  Respiratory: Positive for shortness of breath.   All other systems reviewed and are negative.     Allergies  Percocet and Penicillins  Home Medications   Prior to Admission medications   Medication Sig Start Date End Date Taking? Authorizing Provider  albuterol (PROVENTIL HFA;VENTOLIN HFA) 108 (90 BASE) MCG/ACT inhaler Inhale 2 puffs into the lungs every 6 (six) hours as needed for shortness of breath (shortness of breath).   Yes Historical Provider, MD  albuterol (PROVENTIL) (2.5 MG/3ML) 0.083% nebulizer solution Take 3 mLs (2.5 mg total) by nebulization every 6 (six) hours as needed for wheezing or shortness of breath. 11/14/14  Yes [name], MD  atorvastatin (LIPITOR) 20 MG tablet Take 20 mg by mouth at bedtime.    Yes Historical Provider, MD  carvedilol (COREG) 6.25 MG tablet Take 6.25 mg by mouth 2 (two) times daily with a meal.   Yes Historical Provider, MD  cholecalciferol (VITAMIN D) 1000 UNITS tablet Take 1,000 Units by mouth at bedtime.   Yes Historical Provider, MD  fluticasone (FLONASE) 50 MCG/ACT nasal spray Place 2 sprays into both nostrils daily. Patient taking differently: Place 2 sprays into both nostrils daily. Take as needed for nasal congestion 12/02/14  Yes [name], MD  gabapentin (  NEURONTIN) 300 MG capsule Take 300 mg by mouth 3 (three) times daily.   Yes Historical Provider, MD  HUMALOG KWIKPEN 100 UNIT/ML KiwkPen Inject 25 Units into the skin 3 (three) times daily.  12/19/14  Yes Historical Provider, MD  insulin glargine (LANTUS) 100 UNIT/ML injection Inject 66 Units into the skin at bedtime.   Yes Historical Provider, MD  lisinopril (PRINIVIL,ZESTRIL) 10 MG tablet Take 1 tablet (10 mg total) by mouth at bedtime. 12/27/14  Yes [name], MD  magnesium oxide (MAG-OX) 400 MG tablet Take 400 mg by mouth at bedtime.   Yes Historical Provider, MD  omega-3 acid  ethyl esters (LOVAZA) 1 G capsule Take 2 g by mouth at bedtime.   Yes Historical Provider, MD  torsemide (DEMADEX) 20 MG tablet TAKE ONE TABLET BY MOUTH EVERY DAY 12/07/14  Yes [name], MD  XARELTO 20 MG TABS tablet TAKE ONE TABLET BY MOUTH ONCE DAILY 12/19/14  Yes [name], MD  Misc. Devices [facility] CUSHION) MISC Use to cushion wheelchair 11/14/14   [name], MD  silver sulfADIAZINE (SILVADENE) 1 % cream Apply 1 application topically daily. Patient not taking: Reported on 12/27/2014 11/14/14   [name], MD  Transparent Dressings (TEGADERM + PAD 3-1/2"X6") MISC Apply to wound daily 11/14/14   [name], MD   BP 122/31 mmHg  Pulse 57  Temp(Src) 98.5 F (36.9 C) (Oral)  Resp 11  Ht 6' (1.829 m)  SpO2 100% Physical Exam  Constitutional: He appears well-developed and well-nourished. No distress.  HENT:  Head: Normocephalic and atraumatic.  Mouth/Throat: Oropharynx is clear and moist. No oropharyngeal exudate.  Eyes: Conjunctivae and EOM are normal. Pupils are equal, round, and reactive to light. Right eye exhibits no discharge. Left eye exhibits no discharge. No scleral icterus.  Neck: Normal range of motion. Neck supple. No JVD present. No thyromegaly present.  Cardiovascular: Normal rate, regular rhythm, normal heart sounds and intact distal pulses.  Exam reveals no gallop and no friction rub.   No murmur heard. Pulmonary/Chest: Effort normal and breath sounds normal. No respiratory distress. He has no wheezes. He has no rales.  Abdominal: Soft. Bowel sounds are normal. He exhibits no distension and no mass. There is no tenderness.  Musculoskeletal: Normal range of motion. He exhibits edema ( 2+ bilateral pitting edema, symmetrical, mild swelling of the hands bilaterally). He exhibits no tenderness.  Lymphadenopathy:    He has no cervical adenopathy.  Neurological: He is alert. Coordination normal.  Speech is clear, strength is normal in all  4 extremities, can straight leg raise bilaterally with normal strength  Skin: Skin is warm and dry. No rash noted. No erythema.  Psychiatric: He has a normal mood and affect. His behavior is normal.  Nursing note and vitals reviewed.   ED Course  Procedures (including critical care time) Labs Review Labs Reviewed  CBC WITH DIFFERENTIAL/PLATELET - Abnormal; Notable for the following:    RBC 3.06 (*)    Hemoglobin 8.8 (*)    HCT 27.6 (*)    RDW 15.7 (*)    Monocytes Relative 13 (*)    Monocytes Absolute 1.3 (*)    All other components within normal limits  BRAIN NATRIURETIC PEPTIDE - Abnormal; Notable for the following:    B Natriuretic Peptide 248.0 (*)    All other components within normal limits  I-STAT CHEM 8, ED - Abnormal; Notable for the following:    Sodium 133 (*)  BUN 85 (*)    Creatinine, Ser 2.20 (*)    Glucose, Bld 228 (*)    Calcium, Ion 1.12 (*)    Hemoglobin 9.5 (*)    HCT 28.0 (*)    All other components within normal limits  Rosezena SensorI-STAT TROPOININ, ED    Imaging Review Dg Chest Port 1 View  01/11/2015   CLINICAL DATA:  Shortness of Breath  EXAM: PORTABLE CHEST - 1 VIEW  COMPARISON:  11/08/2014  FINDINGS: Cardiomegaly in stable in appearance.  The right hemidiaphragm is elevated and stable. No focal infiltrate or sizable effusion is seen.  IMPRESSION: No acute abnormality noted.   Electronically Signed   By: Alcide CleverMark  Lukens M.D.   On: 01/11/2015 20:39     EKG Interpretation   Date/Time:  Wednesday January 11 2015 18:25:08 EDT Ventricular Rate:  62 PR Interval:    QRS Duration: 91 QT Interval:  417 QTC Calculation: 423 R Axis:   -44 Text Interpretation:  Atrial fibrillation Left axis deviation Probable  anteroseptal infarct, old Baseline wander in lead(s) I III aVL Abnormal  ekg since last tracing no significant change Confirmed by Hyacinth MeekerMILLER  MD,  Sofija Antwi ([id number]) on 01/11/2015 9:14:57 PM      MDM   Final diagnoses:  SOB (shortness of breath)  Peripheral edema   Renal insufficiency    The patient is not in distress, blood pressure is normal, no tachycardia, underlying rhythm appears to be atrial fibrillation, he does have a history of A. fib, a flutter, he also takes torsemide and Xarelto, echocardiogram reviewed from February, normal ejection fraction, no wall motion abnormalities. Chest x-ray ordered, labs, IV diuretics, reevaluate, possible admission for significant weakness and fluid overload.  Nursing unable to gain IV access, ultrasound used by myself, see procedure note below.  Angiocath insertion Performed by: Vida RollerMILLER,Bayley Hurn D  Consent: Verbal consent obtained. Risks and benefits: risks, benefits and alternatives were discussed Time out: Immediately prior to procedure a "time out" was called to verify the correct patient, procedure, equipment, support staff and site/side marked as required.  Preparation: Patient was prepped and draped in the usual sterile fashion.  Vein Location: R AC  Ultrasound Guided  Gauge: 20  Normal blood return and flush without difficulty Patient tolerance: Patient tolerated the procedure well with no immediate complications.     The patient is in slow rate controlled A. fib, he has no pulmonary edema, he does have elevation in his creatinine and BUN consistent with dehydration, concerning in that he is retaining fluid as well. Lasix, fluids, admit to Dr. Onalee Huaavid of the hospitalist service. No hypotension seen here.  Eber HongBrian Laurenashley Viar, MD 01/11/15 2146

## 2015-01-11 NOTE — ED Notes (Addendum)
Per EMS- pt reports SOB and increasing arm/leg swelling for several days. Hx of afib and chf. Lungs clear for EMS. Home health RN was out today and reports bp at 80 systolic. EMS 140 and 100 systolic. Denies any other symptoms. Pt states that today he was walking and his "legs went out" pt states that he fell and has abrasion to rt hip. denies hitting head or LOC.

## 2015-01-12 DIAGNOSIS — I5033 Acute on chronic diastolic (congestive) heart failure: Secondary | ICD-10-CM | POA: Diagnosis not present

## 2015-01-12 DIAGNOSIS — I10 Essential (primary) hypertension: Secondary | ICD-10-CM | POA: Diagnosis not present

## 2015-01-12 DIAGNOSIS — N179 Acute kidney failure, unspecified: Secondary | ICD-10-CM | POA: Diagnosis not present

## 2015-01-12 LAB — GLUCOSE, CAPILLARY
GLUCOSE-CAPILLARY: 193 mg/dL — AB (ref 70–99)
GLUCOSE-CAPILLARY: 211 mg/dL — AB (ref 70–99)
Glucose-Capillary: 156 mg/dL — ABNORMAL HIGH (ref 70–99)
Glucose-Capillary: 228 mg/dL — ABNORMAL HIGH (ref 70–99)
Glucose-Capillary: 248 mg/dL — ABNORMAL HIGH (ref 70–99)

## 2015-01-12 LAB — BASIC METABOLIC PANEL
ANION GAP: 10 (ref 5–15)
BUN: 73 mg/dL — ABNORMAL HIGH (ref 6–23)
CALCIUM: 8.7 mg/dL (ref 8.4–10.5)
CO2: 28 mmol/L (ref 19–32)
CREATININE: 1.8 mg/dL — AB (ref 0.50–1.35)
Chloride: 100 mmol/L (ref 96–112)
GFR, EST AFRICAN AMERICAN: 43 mL/min — AB (ref >90)
GFR, EST NON AFRICAN AMERICAN: 37 mL/min — AB (ref >90)
Glucose, Bld: 168 mg/dL — ABNORMAL HIGH (ref 70–99)
Potassium: 5.5 mmol/L — ABNORMAL HIGH (ref 3.5–5.1)
SODIUM: 138 mmol/L (ref 135–145)

## 2015-01-12 MED ORDER — RIVAROXABAN 20 MG PO TABS
20.0000 mg | ORAL_TABLET | Freq: Every day | ORAL | Status: DC
  Administered 2015-01-12 – 2015-01-13 (×2): 20 mg via ORAL
  Filled 2015-01-12 (×3): qty 1

## 2015-01-12 MED ORDER — SODIUM CHLORIDE 0.9 % IJ SOLN
3.0000 mL | Freq: Two times a day (BID) | INTRAMUSCULAR | Status: DC
  Administered 2015-01-12 – 2015-01-18 (×13): 3 mL via INTRAVENOUS

## 2015-01-12 MED ORDER — FUROSEMIDE 10 MG/ML IJ SOLN
40.0000 mg | Freq: Every day | INTRAMUSCULAR | Status: DC
  Administered 2015-01-12 – 2015-01-15 (×4): 40 mg via INTRAVENOUS
  Filled 2015-01-12 (×5): qty 4

## 2015-01-12 MED ORDER — SODIUM CHLORIDE 0.9 % IV SOLN: 250.0000 mL | INTRAVENOUS | Status: DC | PRN

## 2015-01-12 MED ORDER — CARVEDILOL 6.25 MG PO TABS
6.2500 mg | ORAL_TABLET | Freq: Two times a day (BID) | ORAL | Status: DC
  Filled 2015-01-12: qty 1

## 2015-01-12 MED ORDER — CARVEDILOL 6.25 MG PO TABS
6.2500 mg | ORAL_TABLET | Freq: Two times a day (BID) | ORAL | Status: DC
  Filled 2015-01-12 (×3): qty 1

## 2015-01-12 MED ORDER — CARVEDILOL 3.125 MG PO TABS
3.1250 mg | ORAL_TABLET | Freq: Two times a day (BID) | ORAL | Status: DC
  Administered 2015-01-14 – 2015-01-18 (×6): 3.125 mg via ORAL
  Filled 2015-01-12 (×13): qty 1

## 2015-01-12 MED ORDER — INSULIN GLARGINE 100 UNIT/ML ~~LOC~~ SOLN
40.0000 [IU] | Freq: Every day | SUBCUTANEOUS | Status: DC
  Administered 2015-01-12 – 2015-01-13 (×2): 40 [IU] via SUBCUTANEOUS
  Filled 2015-01-12 (×3): qty 0.4

## 2015-01-12 MED ORDER — ASPIRIN EC 81 MG PO TBEC
81.0000 mg | DELAYED_RELEASE_TABLET | Freq: Every day | ORAL | Status: DC
  Administered 2015-01-12 – 2015-01-18 (×7): 81 mg via ORAL
  Filled 2015-01-12 (×7): qty 1

## 2015-01-12 MED ORDER — INSULIN ASPART 100 UNIT/ML ~~LOC~~ SOLN
0.0000 [IU] | Freq: Three times a day (TID) | SUBCUTANEOUS | Status: DC
  Administered 2015-01-12: 2 [IU] via SUBCUTANEOUS

## 2015-01-12 MED ORDER — INSULIN GLARGINE 100 UNIT/ML ~~LOC~~ SOLN
66.0000 [IU] | Freq: Every day | SUBCUTANEOUS | Status: DC
  Administered 2015-01-12: 66 [IU] via SUBCUTANEOUS
  Filled 2015-01-12: qty 0.66

## 2015-01-12 MED ORDER — SODIUM CHLORIDE 0.9 % IJ SOLN: 3.0000 mL | INTRAMUSCULAR | Status: DC | PRN

## 2015-01-12 MED ORDER — SODIUM CHLORIDE 0.9 % IV SOLN: INTRAVENOUS | Status: DC

## 2015-01-12 MED ORDER — INSULIN ASPART 100 UNIT/ML ~~LOC~~ SOLN
0.0000 [IU] | Freq: Three times a day (TID) | SUBCUTANEOUS | Status: DC
  Administered 2015-01-12: 4 [IU] via SUBCUTANEOUS
  Administered 2015-01-12: 7 [IU] via SUBCUTANEOUS
  Administered 2015-01-13: 11 [IU] via SUBCUTANEOUS
  Administered 2015-01-13: 4 [IU] via SUBCUTANEOUS
  Administered 2015-01-13: 2 [IU] via SUBCUTANEOUS
  Administered 2015-01-14 – 2015-01-15 (×4): 4 [IU] via SUBCUTANEOUS
  Administered 2015-01-15: 7 [IU] via SUBCUTANEOUS
  Administered 2015-01-16 (×2): 4 [IU] via SUBCUTANEOUS
  Administered 2015-01-16 – 2015-01-17 (×3): 3 [IU] via SUBCUTANEOUS
  Administered 2015-01-18: 7 [IU] via SUBCUTANEOUS
  Administered 2015-01-18: 4 [IU] via SUBCUTANEOUS
  Administered 2015-01-18: 7 [IU] via SUBCUTANEOUS

## 2015-01-12 MED ORDER — INSULIN ASPART 100 UNIT/ML ~~LOC~~ SOLN
0.0000 [IU] | Freq: Every day | SUBCUTANEOUS | Status: DC
  Administered 2015-01-12: 2 [IU] via SUBCUTANEOUS
  Administered 2015-01-13: 4 [IU] via SUBCUTANEOUS
  Administered 2015-01-14: 3 [IU] via SUBCUTANEOUS
  Administered 2015-01-17: 5 [IU] via SUBCUTANEOUS

## 2015-01-12 MED ORDER — ONDANSETRON HCL 4 MG/2ML IJ SOLN: 4.0000 mg | Freq: Four times a day (QID) | INTRAMUSCULAR | Status: DC | PRN

## 2015-01-12 MED ORDER — ALBUTEROL SULFATE (2.5 MG/3ML) 0.083% IN NEBU
2.5000 mg | INHALATION_SOLUTION | Freq: Four times a day (QID) | RESPIRATORY_TRACT | Status: DC | PRN
  Administered 2015-01-13: 2.5 mg via RESPIRATORY_TRACT
  Filled 2015-01-12: qty 3

## 2015-01-12 NOTE — Progress Notes (Signed)
Patient arrived to 3 east by stretcher from emergency department. Placed on tele box 28.  +2 BLE edema.  Patient had no complaints of pain.  Brady heart rate with A.Fib. 1LNC sating 98%. External urinary catheter in place.  Will administer medications and continue to monitor.

## 2015-01-12 NOTE — Progress Notes (Signed)
UR completed 

## 2015-01-12 NOTE — Progress Notes (Signed)
[facility]  Patient Status: Active (receiving services up to time of hospitalization)  [facility] is providing the following services: RN  If patient discharges after hours, please call [phone].   Mark Roberson 01/12/2015, 1:17 PM

## 2015-01-12 NOTE — Progress Notes (Signed)
Pt has stage 2 pressure ulcer on his R buttocks with dressing in place prior to hospital admission. Pt refused dressing to be change at this time. Measured and charted on CHL.

## 2015-01-12 NOTE — Consult Note (Addendum)
WOC wound consult note Reason for Consult: Consult requested for buttock wound.  Pt states it has been there several months, and was healed at one time when [place] used [place] to promote healing.  He took an extended car ride this week and had increased shear and pressure from getting in and out of the car and it re-opened.  He has been using hydrocolloid to protect and promote healing at home and is very well-informed regarding topical treatment. Wound type: Stage 2 to right buttock Pressure Ulcer POA: Yes Measurement:2X1X.1cm Wound bed: red and dry Drainage (amount, consistency, odor) no odor or drainage Periwound: patchy areas of moisture associated skin damage and generalized erythremia surrounding wound  Dressing procedure/placement/frequency: Applied new hydrocolloid and extra dressing left at bedside.  These can be changed Q 3 days or PRN soiling.  Discussed plan of care with patient and he verbalizes understanding. Please re-consult if further assistance is needed.  Thank-you,  Cammie Mcgeeawn Bradey Luzier MSN, RN, CWOCN, MoraWCN-AP, CNS [phone]

## 2015-01-12 NOTE — Progress Notes (Signed)
[facility] PROGRESS NOTE  Hamilton Marinello ZOX:[id number] DOB: [birth year] DOA: 01/11/2015 PCP: [name], MD  Brief Summary  The patient is a 69 year old male with history of morbid obesity, chronic diastolic heart failure, atrial fibrillation, diabetes mellitus type 2 who presented with generalized weakness, increased lower extremity edema, shortness of breath, 30 pound weight gain.   He admitted that he was noncompliant with his diet over the last couple weeks because he went on vacation in [place]. He states he was compliant with his medications.  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 Explained untreated osa can worsen heart failure issues.  HTN with hypotension -  Place hold parameter for carvedilol and decreased dose to 3.125mg   Atrial flutter, junctional bradycardia - decreased carvedilol -  Continue xaralto  DM (diabetes mellitus)- A1c 6.6 11/14/2014, CBG mildly elevated -  Decrease lantus to 40 units -  Increase SSI to high dose and add qhs insulin  Skin tear and stage 2 pressure ulcer, present at time of admission -  Appreciate wound care assistance -  Continue hydrocolloid dressing  Normocytic anemia, likely due to renal disease -  Hemoglobin at baseline of  9.5mg /dl -  Iron studies, W11, folate, TSH  Hyperkalemia, likely spurious from mild hemolysis.  Patient diuresed 5-1/2 L and was not given any potassium repletion. -   Repeat BMP in a.m.  Diet:  Diabetic, low sodium Access:  PIV IVF:  off Proph:  xarelto  Code Status: full Family Communication: patient alone Disposition Plan: pending further diuresis   Consultants:  none  Procedures:  CXR  Antibiotics:  none   HPI/Subjective:  States his swelling and SOB have improved.      Objective: Filed Vitals:   01/12/15 0540 01/12/15 0553 01/12/15 0555 01/12/15 1500  BP: 87/28 90/50 104/58 133/59  Pulse: 53   59  Temp: 98.4 F (36.9 C)   99.3 F (37.4 C)  TempSrc: Oral   Oral  Resp: 17   18  Height:      Weight:      SpO2: 100%   100%    Intake/Output Summary (Last 24 hours) at 01/12/15 1838 Last data filed at 01/12/15 1806  Gross per 24 hour  Intake   1230 ml  Output   8325 ml  Net  -7095 ml   Filed Weights   01/12/15 0016 01/12/15 0500  Weight: 158.124 kg (348 lb 9.6 oz) 158.124 kg (348 lb 9.6 oz)    Exam:   General:   Obese male, nasal cannula in place, No acute distress  HEENT:  NCAT, MMM  Cardiovascular:  RRR, nl S1, S2 no mrg, 2+ pulses, warm extremities  Respiratory:  CTAB, no increased WOB  Abdomen:   NABS, soft, NT/ND  MSK:   Normal tone and bulk,  2+ soft pitting LEE  Neuro:  Grossly intact  Data Reviewed: Basic Metabolic Panel:  Recent Labs Lab 01/11/15 1952 01/12/15 0604  NA 133* 138  K 5.0 5.5*  CL 96 100  CO2  --  28  GLUCOSE 228* 168*  BUN 85* 73*  CREATININE 2.20* 1.80*  CALCIUM  --  8.7   Liver Function Tests: No results for input(s): AST, ALT, ALKPHOS, BILITOT, PROT, ALBUMIN in the last 168 hours. No results for input(s): LIPASE, AMYLASE in the last 168 hours. No results for input(s): AMMONIA in the last 168 hours. CBC:  Recent Labs Lab 01/11/15 1942 01/11/15 1952  WBC 9.9  --   NEUTROABS 6.7  --   HGB 8.8*  9.5*  HCT 27.6* 28.0*  MCV 90.2  --   PLT 185  --    Cardiac Enzymes: No results for input(s): CKTOTAL, CKMB, CKMBINDEX, TROPONINI in the last 168 hours. BNP (last 3 results)  Recent Labs  11/08/14 1236 01/11/15 1942  BNP 300.9* 248.0*    ProBNP (last 3 results) No results for input(s): PROBNP in the last 8760 hours.  CBG:  Recent Labs Lab 01/12/15 0040 01/12/15 0557 01/12/15 1152 01/12/15 1633  GLUCAP 211* 156* 228* 193*    No results found for this or any previous visit (from the past 240 hour(s)).   Studies: Dg Chest Port 1 View  01/11/2015   CLINICAL DATA:  Shortness of Breath  EXAM: PORTABLE CHEST - 1 VIEW  COMPARISON:  11/08/2014  FINDINGS: Cardiomegaly in stable in appearance.  The right hemidiaphragm is elevated and stable. No focal infiltrate or sizable effusion is seen.  IMPRESSION: No acute abnormality noted.   Electronically Signed   By: Alcide CleverMark  Lukens M.D.   On: 01/11/2015 20:39    Scheduled Meds: . aspirin EC  81 mg Oral Daily  . carvedilol  6.25 mg Oral BID WC  . furosemide  40 mg Intravenous Daily  . insulin aspart  0-20 Units Subcutaneous TID WC  . insulin aspart  0-5 Units Subcutaneous QHS  . insulin glargine  40 Units Subcutaneous QHS  . rivaroxaban  20 mg Oral QAC supper  . sodium chloride  3 mL Intravenous Q12H   Continuous Infusions:   Principal Problem:   Acute on chronic diastolic CHF (congestive heart failure) Active Problems:   Morbid obesity   HTN (hypertension)   Atrial flutter   DM (diabetes mellitus)   AKI (acute kidney injury)   Peripheral edema    Time spent: 30 min    Natasia Sanko, [facility]IE  [facility] Pager [phone]. If 7PM-7AM, please contact night-coverage at [email or web address], password [email or web address] 01/12/2015, 6:38 PM

## 2015-01-13 DIAGNOSIS — L89312 Pressure ulcer of right buttock, stage 2: Secondary | ICD-10-CM | POA: Diagnosis present

## 2015-01-13 DIAGNOSIS — N189 Chronic kidney disease, unspecified: Secondary | ICD-10-CM | POA: Diagnosis not present

## 2015-01-13 DIAGNOSIS — Z880 Allergy status to penicillin: Secondary | ICD-10-CM | POA: Diagnosis not present

## 2015-01-13 DIAGNOSIS — K045 Chronic apical periodontitis: Secondary | ICD-10-CM | POA: Diagnosis present

## 2015-01-13 DIAGNOSIS — Z885 Allergy status to narcotic agent status: Secondary | ICD-10-CM | POA: Diagnosis not present

## 2015-01-13 DIAGNOSIS — N179 Acute kidney failure, unspecified: Secondary | ICD-10-CM | POA: Diagnosis present

## 2015-01-13 DIAGNOSIS — Z7901 Long term (current) use of anticoagulants: Secondary | ICD-10-CM | POA: Diagnosis not present

## 2015-01-13 DIAGNOSIS — G4733 Obstructive sleep apnea (adult) (pediatric): Secondary | ICD-10-CM | POA: Diagnosis present

## 2015-01-13 DIAGNOSIS — J9601 Acute respiratory failure with hypoxia: Secondary | ICD-10-CM | POA: Diagnosis present

## 2015-01-13 DIAGNOSIS — R040 Epistaxis: Secondary | ICD-10-CM | POA: Diagnosis not present

## 2015-01-13 DIAGNOSIS — Z96652 Presence of left artificial knee joint: Secondary | ICD-10-CM | POA: Diagnosis present

## 2015-01-13 DIAGNOSIS — I4892 Unspecified atrial flutter: Secondary | ICD-10-CM | POA: Diagnosis present

## 2015-01-13 DIAGNOSIS — R220 Localized swelling, mass and lump, head: Secondary | ICD-10-CM | POA: Diagnosis not present

## 2015-01-13 DIAGNOSIS — K029 Dental caries, unspecified: Secondary | ICD-10-CM | POA: Diagnosis present

## 2015-01-13 DIAGNOSIS — R609 Edema, unspecified: Secondary | ICD-10-CM | POA: Diagnosis present

## 2015-01-13 DIAGNOSIS — E0781 Sick-euthyroid syndrome: Secondary | ICD-10-CM | POA: Diagnosis present

## 2015-01-13 DIAGNOSIS — Z9119 Patient's noncompliance with other medical treatment and regimen: Secondary | ICD-10-CM | POA: Diagnosis present

## 2015-01-13 DIAGNOSIS — M264 Malocclusion, unspecified: Secondary | ICD-10-CM | POA: Diagnosis present

## 2015-01-13 DIAGNOSIS — K047 Periapical abscess without sinus: Secondary | ICD-10-CM | POA: Diagnosis present

## 2015-01-13 DIAGNOSIS — N183 Chronic kidney disease, stage 3 (moderate): Secondary | ICD-10-CM | POA: Diagnosis present

## 2015-01-13 DIAGNOSIS — I5033 Acute on chronic diastolic (congestive) heart failure: Secondary | ICD-10-CM | POA: Diagnosis present

## 2015-01-13 DIAGNOSIS — J45909 Unspecified asthma, uncomplicated: Secondary | ICD-10-CM | POA: Diagnosis present

## 2015-01-13 DIAGNOSIS — D509 Iron deficiency anemia, unspecified: Secondary | ICD-10-CM | POA: Diagnosis present

## 2015-01-13 DIAGNOSIS — E875 Hyperkalemia: Secondary | ICD-10-CM | POA: Diagnosis present

## 2015-01-13 DIAGNOSIS — E1122 Type 2 diabetes mellitus with diabetic chronic kidney disease: Secondary | ICD-10-CM | POA: Diagnosis present

## 2015-01-13 DIAGNOSIS — L03211 Cellulitis of face: Secondary | ICD-10-CM | POA: Diagnosis present

## 2015-01-13 DIAGNOSIS — I130 Hypertensive heart and chronic kidney disease with heart failure and stage 1 through stage 4 chronic kidney disease, or unspecified chronic kidney disease: Secondary | ICD-10-CM | POA: Diagnosis present

## 2015-01-13 DIAGNOSIS — Z6841 Body Mass Index (BMI) 40.0 and over, adult: Secondary | ICD-10-CM | POA: Diagnosis not present

## 2015-01-13 DIAGNOSIS — I4891 Unspecified atrial fibrillation: Secondary | ICD-10-CM | POA: Diagnosis present

## 2015-01-13 DIAGNOSIS — L8992 Pressure ulcer of unspecified site, stage 2: Secondary | ICD-10-CM | POA: Diagnosis present

## 2015-01-13 DIAGNOSIS — D62 Acute posthemorrhagic anemia: Secondary | ICD-10-CM | POA: Diagnosis present

## 2015-01-13 DIAGNOSIS — K011 Impacted teeth: Secondary | ICD-10-CM | POA: Diagnosis present

## 2015-01-13 DIAGNOSIS — I959 Hypotension, unspecified: Secondary | ICD-10-CM | POA: Diagnosis present

## 2015-01-13 LAB — BASIC METABOLIC PANEL
Anion gap: 9 (ref 5–15)
BUN: 52 mg/dL — ABNORMAL HIGH (ref 6–23)
CO2: 30 mmol/L (ref 19–32)
Calcium: 8.8 mg/dL (ref 8.4–10.5)
Chloride: 100 mmol/L (ref 96–112)
Creatinine, Ser: 1.42 mg/dL — ABNORMAL HIGH (ref 0.50–1.35)
GFR calc Af Amer: 57 mL/min — ABNORMAL LOW (ref >90)
GFR calc non Af Amer: 49 mL/min — ABNORMAL LOW (ref >90)
Glucose, Bld: 140 mg/dL — ABNORMAL HIGH (ref 70–99)
POTASSIUM: 4.3 mmol/L (ref 3.5–5.1)
SODIUM: 139 mmol/L (ref 135–145)

## 2015-01-13 LAB — GLUCOSE, CAPILLARY
Glucose-Capillary: 132 mg/dL — ABNORMAL HIGH (ref 70–99)
Glucose-Capillary: 290 mg/dL — ABNORMAL HIGH (ref 70–99)
Glucose-Capillary: 199 mg/dL — ABNORMAL HIGH (ref 70–99)
Glucose-Capillary: 304 mg/dL — ABNORMAL HIGH (ref 70–99)

## 2015-01-13 LAB — IRON AND TIBC
IRON: 35 ug/dL — AB (ref 42–165)
SATURATION RATIOS: 11 % — AB (ref 20–55)
TIBC: 323 ug/dL (ref 215–435)
UIBC: 288 ug/dL (ref 125–400)

## 2015-01-13 LAB — TSH: TSH: 5.653 u[IU]/mL — AB (ref 0.350–4.500)

## 2015-01-13 NOTE — Evaluation (Signed)
Physical Therapy Evaluation Patient Details Name: Mark GoldsMichael Nordgren MRN: [id number] DOB: [birth year] Today's Date: 01/13/2015   History of Present Illness  The patient is a 69 year old male with history of morbid obesity, chronic diastolic heart failure, atrial fibrillation, diabetes mellitus type 2 who presented with generalized weakness, increased lower extremity edema, shortness of breath, 30 pound weight gain. He admitted that he was noncompliant with his diet over the last couple weeks because [place]  Clinical Impression  Pt admitted with above diagnosis. Pt currently with functional limitations due to the deficits listed below (see PT Problem List). Pt will be able to go home with assist.  Will need HHPT and HHRN as well as probable home O2 as he desats without O2 with activity.  Will follow acutely.  Pt will benefit from skilled PT to increase their independence and safety with mobility to allow discharge to the venue listed below.      Follow Up Recommendations Home health PT;Supervision/Assistance - 24 hour [facility](HHRN )    Equipment Recommendations  None recommended by PT    Recommendations for Other Services       Precautions / Restrictions Precautions Precautions: Fall Restrictions Weight Bearing Restrictions: No      Mobility  Bed Mobility Overal bed mobility: Independent                Transfers Overall transfer level: Independent                  Ambulation/Gait Ambulation/Gait assistance: Min guard Ambulation Distance (Feet): 40 Feet (20 feet x 2) Assistive device: Rolling walker (2 wheeled) Gait Pattern/deviations: Step-through pattern;Decreased stride length;Drifts right/left;Wide base of support;Trunk flexed   Gait velocity interpretation: Below normal speed for age/gender General Gait Details: Slightly forward flexed posture.  Pt able to ambulate to bathroom with good safety with RW.  Desats without O2.  See note below.     Stairs            Wheelchair Mobility    Modified Rankin (Stroke Patients Only)       Balance Overall balance assessment: Needs assistance;History of Falls Sitting-balance support: No upper extremity supported;Feet supported Sitting balance-Leahy Scale: Good     Standing balance support: Bilateral upper extremity supported;During functional activity Standing balance-Leahy Scale: Fair Standing balance comment: can stand statically without UE support.  Needed assist to clean bottom when getting off toilet per pt as his dexterity in his hands isn't good.  Pt stated his caregiver helps him with cleaning himself.                               Pertinent Vitals/Pain Pain Assessment: No/denies pain    SATURATION QUALIFICATIONS: (This note is used to comply with regulatory documentation for home oxygen)  Patient Saturations on Room Air at Rest = 90-94%  Patient Saturations on Room Air while Ambulating = 88-89%  Patient Saturations on 2 Liters of oxygen while Ambulating = 91-95%  Please briefly explain why patient needs home oxygen:Pt desats with activity on RA. May need home O2  Home Living Family/patient expects to be discharged to:: Private residence Living Arrangements: Non-relatives/Friends Available Help at Discharge: Friend(s);Available 24 hours/day Type of Home: House Home Access: Stairs to enter Entrance Stairs-Rails: Right Entrance Stairs-Number of Steps: 3 Home Layout: One level Home Equipment: Walker - 2 wheels;Bedside commode;Tub bench;Wheelchair - manual Additional Comments: Pt caregiver Ms. Cope called on phone by  pt.  She confirmed that she is caregiver and helps pt.  Discussed his session with her per pt request.      Prior Function Level of Independence: Independent with assistive device(s)               Hand Dominance        Extremity/Trunk Assessment   Upper Extremity Assessment: Defer to OT evaluation           Lower  Extremity Assessment: Generalized weakness      Cervical / Trunk Assessment: Normal  Communication   Communication: No difficulties  Cognition Arousal/Alertness: Awake/alert Behavior During Therapy: WFL for tasks assessed/performed Overall Cognitive Status: Within Functional Limits for tasks assessed                      General Comments General comments (skin integrity, edema, etc.): Small abraision Area on bottom noted.  Nursing aware.      Exercises        Assessment/Plan    PT Assessment Patient needs continued PT services  PT Diagnosis Generalized weakness   PT Problem List Decreased activity tolerance;Decreased balance;Decreased mobility;Decreased knowledge of use of DME;Decreased safety awareness;Decreased knowledge of precautions  PT Treatment Interventions DME instruction;Gait training;Stair training;Functional mobility training;Therapeutic activities;Therapeutic exercise;Balance training;Patient/family education   PT Goals (Current goals can be found in the Care Plan section) Acute Rehab PT Goals Patient Stated Goal: to go home PT Goal Formulation: With patient Time For Goal Achievement: 01/20/15 Potential to Achieve Goals: Good    Frequency Min 3X/week   Barriers to discharge        Co-evaluation               End of Session Equipment Utilized During Treatment: Gait belt;Oxygen Activity Tolerance: Patient limited by fatigue Patient left: in chair;with call bell/phone within reach Nurse Communication: Mobility status    Functional Assessment Tool Used: clinical judgment Functional Limitation: Mobility: Walking and moving around Mobility: Walking and Moving Around Current Status (Z6109): At least 1 percent but less than 20 percent impaired, limited or restricted Mobility: Walking and Moving Around Goal Status [phone]): 0 percent impaired, limited or restricted    Time: 0981-1914 PT Time Calculation (min) (ACUTE ONLY): 31 min   Charges:    PT Evaluation $Initial PT Evaluation Tier I: 1 Procedure PT Treatments $Gait Training: 8-22 mins   PT G Codes:   PT G-Codes **NOT FOR INPATIENT CLASS** Functional Assessment Tool Used: clinical judgment Functional Limitation: Mobility: Walking and moving around Mobility: Walking and Moving Around Current Status (N8295): At least 1 percent but less than 20 percent impaired, limited or restricted Mobility: Walking and Moving Around Goal Status [phone]): 0 percent impaired, limited or restricted    Berline Lopes 01/13/2015, 12:49 PM  Finnlee Guarnieri,PT Acute Rehabilitation [phone] (pager)

## 2015-01-13 NOTE — Telephone Encounter (Signed)
Started talking with bonita and asked if she still needed verbal orders---she stated patient was admitted to hospital, and then i lost connection on phone----i called back and left message advising that dr Dorise Hisskollar is out of office until Tuesday 4/27, but if she still needed order for pt, to call us back and i will try to get another doctor to assist

## 2015-01-13 NOTE — Progress Notes (Signed)
SATURATION QUALIFICATIONS: (This note is used to comply with regulatory documentation for home oxygen)  Patient Saturations on Room Air at Rest = 90-94%  Patient Saturations on Room Air while Ambulating = 88-89%  Patient Saturations on 2 Liters of oxygen while Ambulating = 91-95%  Please briefly explain why patient needs home oxygen:Pt desats with activity on RA.  May need home O2.  Thanks. [facility] ParkDawn Journei [place],PT Acute Rehabilitation [phone] (pager)

## 2015-01-13 NOTE — Progress Notes (Signed)
[facility] PROGRESS NOTE  Mark GoldsMichael Claar JYN:[id number] DOB: September 07, 1946 DOA: 01/11/2015 PCP: Judie BonusKollar, Elizabeth A, MD  Brief Summary  The patient is a 69 year old male with history of morbid obesity, chronic diastolic heart failure, atrial fibrillation, diabetes mellitus type 2 who presented with generalized weakness, increased lower extremity edema, shortness of breath, 30 pound weight gain.   He admitted that he was noncompliant with his diet over the last couple weeks because he went on vacation in [place]. He states he was compliant with his medications.  Assessment/Plan   acute hypoxic respiratory failure secondary to acute on chronic diastolic heart failure -   Recent echocardiogram demonstrated ejection fraction of 60-65% with mild MR, moderately dilated left atrium, moderately dilated RV , peak PA pressure 50 mmHg -   Diuresed 3 L yesterday -  Weight down 4kg -   Continue Lasix 40 mg IV once daily given brisk diuresis >> he seems particularly sensitive to diuretics which makes me question whether he was taking or absorbing his medications -   Continue daily electrolytes -  Reports his dry weight may be near 320-lbs   Acute on chronic kidney disease stage 3, likely cardiorenal syndrome, creatinine has trended down with diuresis -   Minimize nephrotoxins and renally dose medications  Morbid obesity- He needs sleep study and advised this. Explained untreated osa can worsen heart failure issues.  HTN with hypotension -  Place hold parameter for carvedilol and decreased dose to 3.125mg   Atrial flutter, junctional bradycardia - decreased carvedilol -  Continue xaralto  DM (diabetes mellitus)- A1c 6.6 11/14/2014, CBG mildly elevated -  Continue lantus to 40 units -  Increase SSI to high dose and add qhs insulin  Skin tear and stage 2 pressure ulcer, present at time of admission -  Appreciate wound care assistance -  Continue hydrocolloid dressing  Normocytic  anemia, likely due to renal disease -  Hemoglobin at baseline of 9.5mg /dl -  Iron studies, Q65b12, folate, TSH 5.653  Hyperkalemia, likely spurious from mild hemolysis.  Patient diuresed 5-1/2 L and was not given any potassium repletion.  resolved  Probable sick euthyroid -  Check fT3,fT4  Diet:  Diabetic, low sodium Access:  PIV IVF:  off Proph:  xarelto  Code Status: full Family Communication: patient alone Disposition Plan: pending further diuresis   Consultants:  none  Procedures:  CXR  Antibiotics:  none   HPI/Subjective:  States his swelling and SOB have improved.  Asking if his sleep study can be done in the hospital  Objective: Filed Vitals:   01/13/15 0840 01/13/15 0932 01/13/15 1444 01/13/15 1518  BP: 118/48 127/48 131/50   Pulse: 63 68 62   Temp:   98.8 F (37.1 C)   TempSrc:   Oral   Resp:  18 18   Height:      Weight:      SpO2:  98% 98% 95%    Intake/Output Summary (Last 24 hours) at 01/13/15 1746 Last data filed at 01/13/15 1629  Gross per 24 hour  Intake   1890 ml  Output   2975 ml  Net  -1085 ml   Filed Weights   01/12/15 0016 01/12/15 0500 01/13/15 0457  Weight: 158.124 kg (348 lb 9.6 oz) 158.124 kg (348 lb 9.6 oz) 154.767 kg (341 lb 3.2 oz)    Exam:   General:   Obese male, nasal cannula in place, No acute distress  HEENT:  NCAT, MMM, face appears much thinner today  Cardiovascular:  RRR, nl S1, S2 no mrg, 2+ pulses, warm extremities  Respiratory:  CTAB, no increased WOB  Abdomen:   NABS, soft, NT/ND  MSK:   Normal tone and bulk,  2+ soft pitting LEE  Neuro:  Grossly intact  Data Reviewed: Basic Metabolic Panel:  Recent Labs Lab 01/11/15 1952 01/12/15 0604 01/13/15 0535  NA 133* 138 139  K 5.0 5.5* 4.3  CL 96 100 100  CO2  --  28 30  GLUCOSE 228* 168* 140*  BUN 85* 73* 52*  CREATININE 2.20* 1.80* 1.42*  CALCIUM  --  8.7 8.8   Liver Function Tests: No results for input(s): AST, ALT, ALKPHOS, BILITOT, PROT,  ALBUMIN in the last 168 hours. No results for input(s): LIPASE, AMYLASE in the last 168 hours. No results for input(s): AMMONIA in the last 168 hours. CBC:  Recent Labs Lab 01/11/15 1942 01/11/15 1952  WBC 9.9  --   NEUTROABS 6.7  --   HGB 8.8* 9.5*  HCT 27.6* 28.0*  MCV 90.2  --   PLT 185  --    Cardiac Enzymes: No results for input(s): CKTOTAL, CKMB, CKMBINDEX, TROPONINI in the last 168 hours. BNP (last 3 results)  Recent Labs  11/08/14 1236 01/11/15 1942  BNP 300.9* 248.0*    ProBNP (last 3 results) No results for input(s): PROBNP in the last 8760 hours.  CBG:  Recent Labs Lab 01/12/15 1633 01/12/15 2109 01/13/15 0554 01/13/15 1123 01/13/15 1706  GLUCAP 193* 248* 132* 290* 199*    No results found for this or any previous visit (from the past 240 hour(s)).   Studies: Dg Chest Port 1 View  01/11/2015   CLINICAL DATA:  Shortness of Breath  EXAM: PORTABLE CHEST - 1 VIEW  COMPARISON:  11/08/2014  FINDINGS: Cardiomegaly in stable in appearance.  The right hemidiaphragm is elevated and stable. No focal infiltrate or sizable effusion is seen.  IMPRESSION: No acute abnormality noted.   Electronically Signed   By: Alcide Clever M.D.   On: 01/11/2015 20:39    Scheduled Meds: . aspirin EC  81 mg Oral Daily  . carvedilol  3.125 mg Oral BID WC  . furosemide  40 mg Intravenous Daily  . insulin aspart  0-20 Units Subcutaneous TID WC  . insulin aspart  0-5 Units Subcutaneous QHS  . insulin glargine  40 Units Subcutaneous QHS  . rivaroxaban  20 mg Oral QAC supper  . sodium chloride  3 mL Intravenous Q12H   Continuous Infusions:   Principal Problem:   Acute on chronic diastolic CHF (congestive heart failure) Active Problems:   Morbid obesity   HTN (hypertension)   Atrial flutter   DM (diabetes mellitus)   AKI (acute kidney injury)   Peripheral edema   Acute on chronic diastolic CHF (congestive heart failure), NYHA class 1    Time spent: 30 min    Pamela Intrieri,  [facility]  [facility] Pager [phone]. If 7PM-7AM, please contact night-coverage at [email or web address], password [facility] 01/13/2015, 5:46 PM  LOS: 0 days

## 2015-01-13 NOTE — Progress Notes (Signed)
Inpatient Diabetes Program Recommendations  AACE/ADA: New Consensus Statement on Inpatient Glycemic Control (2013)  Target Ranges:  Prepandial:   less than 140 mg/dL      Peak postprandial:   less than 180 mg/dL (1-2 hours)      Critically ill patients:  140 - 180 mg/dL   Inpatient Diabetes Program Recommendations Diet: add carb modified to current heart healthy diet Thank you  Chancelor Hardrick BSN, RN,CDE Inpatient Diabetes Coordinator [phone] (team pager)  

## 2015-01-14 ENCOUNTER — Inpatient Hospital Stay (HOSPITAL_COMMUNITY): Payer: Medicare Other

## 2015-01-14 ENCOUNTER — Encounter (HOSPITAL_COMMUNITY): Payer: Self-pay | Admitting: Radiology

## 2015-01-14 DIAGNOSIS — R040 Epistaxis: Secondary | ICD-10-CM

## 2015-01-14 DIAGNOSIS — N189 Chronic kidney disease, unspecified: Secondary | ICD-10-CM

## 2015-01-14 DIAGNOSIS — E1122 Type 2 diabetes mellitus with diabetic chronic kidney disease: Secondary | ICD-10-CM

## 2015-01-14 DIAGNOSIS — D509 Iron deficiency anemia, unspecified: Secondary | ICD-10-CM

## 2015-01-14 LAB — GLUCOSE, CAPILLARY
GLUCOSE-CAPILLARY: 187 mg/dL — AB (ref 70–99)
GLUCOSE-CAPILLARY: 164 mg/dL — AB (ref 70–99)
Glucose-Capillary: 191 mg/dL — ABNORMAL HIGH (ref 70–99)
Glucose-Capillary: 278 mg/dL — ABNORMAL HIGH (ref 70–99)

## 2015-01-14 LAB — BASIC METABOLIC PANEL
ANION GAP: 11 (ref 5–15)
BUN: 41 mg/dL — ABNORMAL HIGH (ref 6–23)
CO2: 28 mmol/L (ref 19–32)
Calcium: 9 mg/dL (ref 8.4–10.5)
Chloride: 100 mmol/L (ref 96–112)
Creatinine, Ser: 1.35 mg/dL (ref 0.50–1.35)
GFR calc Af Amer: 61 mL/min — ABNORMAL LOW (ref >90)
GFR calc non Af Amer: 52 mL/min — ABNORMAL LOW (ref >90)
Glucose, Bld: 184 mg/dL — ABNORMAL HIGH (ref 70–99)
POTASSIUM: 4.4 mmol/L (ref 3.5–5.1)
SODIUM: 139 mmol/L (ref 135–145)

## 2015-01-14 LAB — CBC
HCT: 29.3 % — ABNORMAL LOW (ref 39.0–52.0)
Hemoglobin: 9.2 g/dL — ABNORMAL LOW (ref 13.0–17.0)
MCH: 28.6 pg (ref 26.0–34.0)
MCHC: 31.4 g/dL (ref 30.0–36.0)
MCV: 91 fL (ref 78.0–100.0)
Platelets: 221 10*3/uL (ref 150–400)
RBC: 3.22 MIL/uL — ABNORMAL LOW (ref 4.22–5.81)
RDW: 15.4 % (ref 11.5–15.5)
WBC: 12.1 10*3/uL — AB (ref 4.0–10.5)

## 2015-01-14 LAB — TRANSFERRIN: Transferrin: 282 mg/dL (ref 200–370)

## 2015-01-14 LAB — T4, FREE: FREE T4: 1.25 ng/dL (ref 0.80–1.80)

## 2015-01-14 LAB — VITAMIN B12: Vitamin B-12: 455 pg/mL (ref 211–911)

## 2015-01-14 LAB — FERRITIN: Ferritin: 35 ng/mL (ref 22–322)

## 2015-01-14 LAB — FOLATE: Folate: 15.5 ng/mL

## 2015-01-14 MED ORDER — FERROUS SULFATE 325 (65 FE) MG PO TABS
325.0000 mg | ORAL_TABLET | Freq: Two times a day (BID) | ORAL | Status: DC
  Administered 2015-01-14 – 2015-01-18 (×2): 325 mg via ORAL
  Filled 2015-01-14 (×10): qty 1

## 2015-01-14 MED ORDER — IOHEXOL 300 MG/ML  SOLN
100.0000 mL | Freq: Once | INTRAMUSCULAR | Status: AC | PRN
  Administered 2015-01-14: 100 mL via INTRAVENOUS

## 2015-01-14 MED ORDER — SALINE SPRAY 0.65 % NA SOLN
1.0000 | NASAL | Status: DC | PRN
  Administered 2015-01-17: 1 via NASAL
  Filled 2015-01-14: qty 44

## 2015-01-14 MED ORDER — CHLORHEXIDINE GLUCONATE 0.12 % MT SOLN
15.0000 mL | Freq: Four times a day (QID) | OROMUCOSAL | Status: DC
  Administered 2015-01-14 – 2015-01-17 (×11): 15 mL via OROMUCOSAL
  Filled 2015-01-14 (×18): qty 15

## 2015-01-14 MED ORDER — OXYMETAZOLINE HCL 0.05 % NA SOLN
2.0000 | Freq: Once | NASAL | Status: AC
  Administered 2015-01-14: 2 via NASAL
  Filled 2015-01-14: qty 15

## 2015-01-14 MED ORDER — INSULIN GLARGINE 100 UNIT/ML ~~LOC~~ SOLN
50.0000 [IU] | Freq: Every day | SUBCUTANEOUS | Status: DC
Start: 2015-01-14 — End: 2015-01-15
  Administered 2015-01-14: 50 [IU] via SUBCUTANEOUS
  Filled 2015-01-14 (×2): qty 0.5

## 2015-01-14 MED ORDER — CLINDAMYCIN PHOSPHATE 600 MG/50ML IV SOLN
600.0000 mg | Freq: Three times a day (TID) | INTRAVENOUS | Status: DC
  Administered 2015-01-14 – 2015-01-18 (×13): 600 mg via INTRAVENOUS
  Filled 2015-01-14 (×16): qty 50

## 2015-01-14 MED ORDER — SACCHAROMYCES BOULARDII 250 MG PO CAPS
250.0000 mg | ORAL_CAPSULE | Freq: Two times a day (BID) | ORAL | Status: DC
  Administered 2015-01-14 – 2015-01-18 (×8): 250 mg via ORAL
  Filled 2015-01-14 (×9): qty 1

## 2015-01-14 NOTE — Progress Notes (Signed)
The patient's nose is bleeding. He is blowing his nose and refuses to stop blowing. I packed his nose three times and he pulled the packing out. Dr Claiborne Billingsallahan notified and Afrin nose spray was ordered.

## 2015-01-14 NOTE — Progress Notes (Signed)
Called to the room pt c/o of a nose bleed. No bleed to the left nare, instructed pt we need to pack his nose and apply pressure. Gauze applied pt. able to tolerate for a short period. Pushed my hand away and stated he couldn't tolerate anymore. Pt stated to blow his nose into towel, explained to patient the importance of not blowing his nose and applying pressure. MD made aware. Nose bleed stopped within 10 mins. Will monitor.

## 2015-01-14 NOTE — Progress Notes (Addendum)
[facility] PROGRESS NOTE  Mark Roberson ZOX:[id number] DOB: [birth year] DOA: 01/11/2015 PCP: [name], MD  Brief Summary  The patient is a 69 year old male with history of morbid obesity, chronic diastolic heart failure, atrial fibrillation, diabetes mellitus type 2 who presented with generalized weakness, increased lower extremity edema, shortness of breath, 30 pound weight gain.   He admitted that he was noncompliant with his diet over the last couple weeks because he went on vacation in [place]. He states he was compliant with his medications, however, he responded briskly to IV lasix.    Assessment/Plan  Acute hypoxic respiratory failure secondary to acute on chronic diastolic heart failure -   Recent echocardiogram demonstrated ejection fraction of 60-65% with mild MR, moderately dilated left atrium, moderately dilated RV , peak PA pressure 50 mmHg -   Diuresed 1.1 L yesterday -  Weight down to 335-lbs -   Continue Lasix 40 mg IV once daily -   Continue daily electrolytes -  Reports his dry weight may be near 320-lbs  -  Tele:  NSR, okay to d/c telemetry  Epistaxis, has been going on every other day at home and was much worse last night "gushing all night" from both nares -  Hemoglobin stable -  Patient declined cold compresses or packing by nursing staff -  Patient declined to hold pressure -  Continue afrin and start nasal saline and remove O2 if possible -  Advised patient to avoid blowing his nose -  Hold xarelto  -  ENT consult in AM unless serious bleeding recurs overnight  Superior apical tooth abscess, feels to be at least 3-4cm in diameter -  CT face -  Start clindamycin and peridex -  Start florastor -  No Dentistry on call this weekend.  Will consult on Monday for possible tooth extraction unless complicating factor in which case, oral surgery may be able to assist  Acute on chronic kidney disease stage 3, likely cardiorenal syndrome,  creatinine has trended down with diuresis -   Minimize nephrotoxins and renally dose medications  Morbid obesity- He needs sleep study and advised this. Explained untreated osa can worsen heart failure issues.  HTN with hypotension -  Place hold parameter for carvedilol and decreased dose to 3.125mg   Atrial flutter, junctional bradycardia - continue carvedilol -  Hold xarelto due to epistaxis  DM (diabetes mellitus)- A1c 6.6 11/14/2014, CBG mildly elevated -  Increase lantus to 50 units -  Increase SSI to high dose and add qhs insulin  Skin tear and stage 2 pressure ulcer, present at time of admission -  Appreciate wound care assistance -  Continue hydrocolloid dressing  Normocytic anemia, likely due to renal disease -  Hemoglobin at baseline of 9.5mg /dl -  Iron studies c/w iron deficiency (possibly due to frequent nose bleeds), b12 455, folate 15, TSH 5.653 -  Start iron supplementation  Hyperkalemia, likely spurious from mild hemolysis.  Patient diuresed 5-1/2 L and was not given any potassium repletion.  resolved  Probable sick euthyroid -  FT3,fT4 pending  Mild leukocytosis likely related to his apical abscess  Diet:  Diabetic, low sodium Access:  PIV IVF:  off Proph:  SCDs  Code Status: full Family Communication: patient alone Disposition Plan: pending further diuresis, treatment for apical abscess and nose bleed   Consultants:  none  Procedures:  CXR  Antibiotics:  none   HPI/Subjective:  Had gushing nose bleed from both nares last night that lasted from 3AM to  7AM.  He declined pressure, cold compress, and packing.  He kept blowing his nose overnight.  He requested ENT consult this morning and states he has previously needed ENT packing for his nose bleeds.  Has nose bleeds every other night at home "but not this bad."  Blew his nose later this morning and had a small repeat nose bleed.  Also, his left face is swollen and painful and his gums are sore  on the upper left side but he is not draining pus  Objective: Filed Vitals:   01/14/15 0100 01/14/15 0704 01/14/15 1044 01/14/15 1300  BP: 137/47 126/49  108/39  Pulse: 68 74  64  Temp: 98.7 F (37.1 C) 98.6 F (37 C)  98.2 F (36.8 C)  TempSrc: Oral Oral  Oral  Resp: Height:      Weight:   151.955 kg (335 lb)   SpO2: 98% 99%  100%    Intake/Output Summary (Last 24 hours) at 01/14/15 1623 Last data filed at 01/14/15 1439  Gross per 24 hour  Intake    700 ml  Output   2451 ml  Net  -1751 ml   Filed Weights   01/13/15 0457 01/14/15 1044  Weight: 154.767 kg (341 lb 3.2 oz) 151.955 kg (335 lb)    Exam:   General:   Obese male, No acute distress  HEENT:  NCAT, MMM, nares both with crusted blood and some clot most noticeable in the left nare.  Marked swelling of the left cheek today with overlying erythema, 3-4 cm fluctuant mass   Cardiovascular:  RRR, nl S1, S2 no mrg, 2+ pulses, warm extremities  Respiratory:  CTAB, no increased WOB  Abdomen:   NABS, soft, NT/ND  MSK:   Normal tone and bulk, 1 + soft pitting LEE of bilateral lower extremities  Neuro:  Grossly intact  Data Reviewed: Basic Metabolic Panel:  Recent Labs Lab 01/11/15 1952 01/12/15 0604 01/13/15 0535 01/14/15 0513  NA 133* 138 139 139  K 5.0 5.5* 4.3 4.4  CL 96 100 100 100  CO2  --  GLUCOSE 228* 168* 140* 184*  BUN 85* 73* 52* 41*  CREATININE 2.20* 1.80* 1.42* 1.35  CALCIUM  --  8.7 8.8 9.0   Liver Function Tests: No results for input(s): AST, ALT, ALKPHOS, BILITOT, PROT, ALBUMIN in the last 168 hours. No results for input(s): LIPASE, AMYLASE in the last 168 hours. No results for input(s): AMMONIA in the last 168 hours. CBC:  Recent Labs Lab 01/11/15 1942 01/11/15 1952 01/14/15 0513  WBC 9.9  --  12.1*  NEUTROABS 6.7  --   --   HGB 8.8* 9.5* 9.2*  HCT 27.6* 28.0* 29.3*  MCV 90.2  --  91.0  PLT 185  --  221   Cardiac Enzymes: No results for input(s):  CKTOTAL, CKMB, CKMBINDEX, TROPONINI in the last 168 hours. BNP (last 3 results)  Recent Labs  11/08/14 1236 01/11/15 1942  BNP 300.9* 248.0*    ProBNP (last 3 results) No results for input(s): PROBNP in the last 8760 hours.  CBG:  Recent Labs Lab 01/13/15 1123 01/13/15 1706 01/13/15 2129 01/14/15 0548 01/14/15 1130  GLUCAP 290* 199* 304* 187* 191*    No results found for this or any previous visit (from the past 240 hour(s)).   Studies: No results found.  Scheduled Meds: . aspirin EC  81 mg Oral Daily  . carvedilol  3.125 mg Oral BID  WC  . clindamycin (CLEOCIN) IV  600 mg Intravenous 3 times per day  . furosemide  40 mg Intravenous Daily  . insulin aspart  0-20 Units Subcutaneous TID WC  . insulin aspart  0-5 Units Subcutaneous QHS  . insulin glargine  40 Units Subcutaneous QHS  . sodium chloride  3 mL Intravenous Q12H   Continuous Infusions:   Principal Problem:   Acute on chronic diastolic CHF (congestive heart failure) Active Problems:   Morbid obesity   HTN (hypertension)   Atrial flutter   DM (diabetes mellitus), type 2 with renal complications   AKI (acute kidney injury)   Peripheral edema   Acute on chronic diastolic CHF (congestive heart failure), NYHA class 1    Time spent: 30 min    Wandalee Klang, [facility]  [facility] Pager [phone]. If 7PM-7AM, please contact night-coverage at [email or web address], password [email or web address] 01/14/2015, 4:23 PM  LOS: 1 day

## 2015-01-15 DIAGNOSIS — R040 Epistaxis: Secondary | ICD-10-CM

## 2015-01-15 DIAGNOSIS — K047 Periapical abscess without sinus: Secondary | ICD-10-CM

## 2015-01-15 LAB — CBC
HCT: 25.2 % — ABNORMAL LOW (ref 39.0–52.0)
Hemoglobin: 8 g/dL — ABNORMAL LOW (ref 13.0–17.0)
MCH: 29.1 pg (ref 26.0–34.0)
MCHC: 31.7 g/dL (ref 30.0–36.0)
MCV: 91.6 fL (ref 78.0–100.0)
PLATELETS: 177 10*3/uL (ref 150–400)
RBC: 2.75 MIL/uL — AB (ref 4.22–5.81)
RDW: 15.4 % (ref 11.5–15.5)
WBC: 10.3 10*3/uL (ref 4.0–10.5)

## 2015-01-15 LAB — BASIC METABOLIC PANEL
Anion gap: 10 (ref 5–15)
BUN: 35 mg/dL — AB (ref 6–23)
CO2: 29 mmol/L (ref 19–32)
CREATININE: 1.38 mg/dL — AB (ref 0.50–1.35)
Calcium: 8.7 mg/dL (ref 8.4–10.5)
Chloride: 98 mmol/L (ref 96–112)
GFR calc Af Amer: 59 mL/min — ABNORMAL LOW (ref >90)
GFR calc non Af Amer: 51 mL/min — ABNORMAL LOW (ref >90)
Glucose, Bld: 151 mg/dL — ABNORMAL HIGH (ref 70–99)
Potassium: 4.5 mmol/L (ref 3.5–5.1)
SODIUM: 137 mmol/L (ref 135–145)

## 2015-01-15 LAB — GLUCOSE, CAPILLARY
Glucose-Capillary: 157 mg/dL — ABNORMAL HIGH (ref 70–99)
Glucose-Capillary: 183 mg/dL — ABNORMAL HIGH (ref 70–99)
Glucose-Capillary: 205 mg/dL — ABNORMAL HIGH (ref 70–99)
Glucose-Capillary: 199 mg/dL — ABNORMAL HIGH (ref 70–99)

## 2015-01-15 LAB — T3, FREE: T3 FREE: 2.9 pg/mL (ref 2.0–4.4)

## 2015-01-15 MED ORDER — BISACODYL 5 MG PO TBEC: 5.0000 mg | DELAYED_RELEASE_TABLET | Freq: Every day | ORAL | Status: DC | PRN

## 2015-01-15 MED ORDER — DOCUSATE SODIUM 100 MG PO CAPS
100.0000 mg | ORAL_CAPSULE | Freq: Two times a day (BID) | ORAL | Status: DC
  Administered 2015-01-15 – 2015-01-17 (×5): 100 mg via ORAL
  Filled 2015-01-15 (×8): qty 1

## 2015-01-15 MED ORDER — POLYETHYLENE GLYCOL 3350 17 G PO PACK
17.0000 g | PACK | Freq: Every day | ORAL | Status: DC | PRN
  Filled 2015-01-15: qty 1

## 2015-01-15 MED ORDER — INSULIN GLARGINE 100 UNIT/ML ~~LOC~~ SOLN
55.0000 [IU] | Freq: Every day | SUBCUTANEOUS | Status: DC
  Administered 2015-01-15 – 2015-01-17 (×3): 55 [IU] via SUBCUTANEOUS
  Filled 2015-01-15 (×4): qty 0.55

## 2015-01-15 MED ORDER — INSULIN ASPART 100 UNIT/ML ~~LOC~~ SOLN
3.0000 [IU] | Freq: Three times a day (TID) | SUBCUTANEOUS | Status: DC
  Administered 2015-01-15 – 2015-01-18 (×9): 3 [IU] via SUBCUTANEOUS

## 2015-01-15 NOTE — Progress Notes (Signed)
[facility] PROGRESS NOTE  Mark Roberson ZOX:[id number] DOB: [birth year] DOA: 01/11/2015 PCP: [name], MD  Brief Summary  The patient is a 69 year old male with history of morbid obesity, chronic diastolic heart failure, atrial fibrillation, diabetes mellitus type 2 who presented with generalized weakness, increased lower extremity edema, shortness of breath, 30 pound weight gain.   He admitted that he was noncompliant with his diet over the last couple weeks because he went on vacation in [place]. He states he was compliant with his medications, however, he responded briskly to IV lasix.    Assessment/Plan  Acute hypoxic respiratory failure secondary to acute on chronic diastolic heart failure -   Recent echocardiogram demonstrated ejection fraction of 60-65% with mild MR, moderately dilated left atrium, moderately dilated RV , peak PA pressure 50 mmHg -   Diuresed 1 L yesterday -  Weight down to 332-lbs -   Continue Lasix 40 mg IV once daily -   Continue daily electrolytes -  Reports his dry weight may be near 325-lbs   Epistaxis, has been going on every other day at home and had episode of "gushing" from both nares that lasted about 5 hours overnight from 4/22-4/23 and his hemoglobin trended down to 8. 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 Explained untreated osa can worsen heart failure issues.  HTN with hypotension -  Place hold parameter for carvedilol and decreased dose to 3.125mg   Atrial flutter, junctional bradycardia - continue carvedilol -  Hold xarelto due to epistaxis  DM (diabetes mellitus)- A1c 6.6 11/14/2014, CBG mildly elevated -  Increase lantus to 55 units -  Continue SSI to high dose with qhs insulin -  Add aspart 3 units with meals  Skin tear and stage 2 pressure ulcer, present at time of admission -  Appreciate wound care assistance -  Continue hydrocolloid dressing  Acute blood loss anemia superimposed on iron deficiency anemia -  Hemoglobin at baseline of 9.5mg /dl -  Iron studies c/w iron deficiency (possibly due to frequent nose bleeds), b12 455, folate 15, TSH 5.653 -  Start iron supplementation  Hyperkalemia, likely spurious from mild hemolysis.  Patient diuresed 5-1/2 L and was not given any potassium repletion.  resolved  Sick euthyroid.  FT3/fT4 wnl.  TSH mildly elevated -  Repeat TFTs in 3-4 weeks as outpatient  Mild leukocytosis likely related to his apical abscess  Diet:  Diabetic, low sodium Access:  PIV IVF:  off Proph:  SCDs  Code Status: full Family Communication: patient alone Disposition Plan: pending further diuresis, treatment for apical abscess and nose bleed   Consultants:  none  Procedures:  CXR  CT maxillofacial  Antibiotics:  none   HPI/Subjective:  Face swelling is  about the same today, still painful and red.  Denies further nose bleeds.  Swelling decreasing.  Denies SOB  Objective: Filed Vitals:   01/14/15 1842 01/14/15 2103 01/15/15 0501 01/15/15 1300  BP: 121/42 112/42 110/53 105/43  Pulse: 62 68 71 68  Temp:  98.2 F (36.8 C) 98.2 F (36.8 C) 98 F (36.7 C)  TempSrc:  Oral Oral Oral  Resp:  18 18 18    Height:      Weight:   150.776 kg (332 lb 6.4 oz)   SpO2:  97% 97% 96%    Intake/Output Summary (Last 24 hours) at 01/15/15 1607 Last data filed at 01/15/15 1400  Gross per 24 hour  Intake    880 ml  Output   1000 ml  Net   -120 ml   Filed Weights   01/14/15 1044 01/15/15 0501  Weight: 151.955 kg (335 lb) 150.776 kg (332 lb 6.4 oz)    Exam:   General:   Obese male, No acute distress  HEENT:  NCAT, MMM.  Marked swelling of the left cheek today with less overlying erythema, persistent induration.  Poor dentition  Cardiovascular:  RRR, nl S1, S2 no mrg, 2+ pulses, warm extremities  Respiratory:  CTAB, no increased WOB  Abdomen:   NABS, soft, NT/ND  MSK:   Normal tone and bulk, 1 + soft pitting LEE of bilateral lower extremities  Neuro:  Grossly intact  Data Reviewed: Basic Metabolic Panel:  Recent Labs Lab 01/11/15 1952 01/12/15 0604 01/13/15 0535 01/14/15 0513 01/15/15 0657  NA 133* 138 139 139 137  K 5.0 5.5* 4.3 4.4 4.5  CL 96 100 100 100 98  CO2  --  28 30 28 29   GLUCOSE 228* 168* 140* 184* 151*  BUN 85* 73* 52* 41* 35*  CREATININE 2.20* 1.80* 1.42* 1.35 1.38*  CALCIUM  --  8.7 8.8 9.0 8.7   Liver Function Tests: No results for input(s): AST, ALT, ALKPHOS, BILITOT, PROT, ALBUMIN in the last 168 hours. No results for input(s): LIPASE, AMYLASE in the last 168 hours. No results for input(s): AMMONIA in the last 168 hours. CBC:  Recent Labs Lab 01/11/15 1942 01/11/15 1952 01/14/15 0513 01/15/15 0657  WBC 9.9  --  12.1* 10.3  NEUTROABS 6.7  --   --   --   HGB 8.8* 9.5* 9.2* 8.0*  HCT 27.6* 28.0* 29.3* 25.2*  MCV 90.2  --  91.0 91.6  PLT 185  --  221 177   Cardiac Enzymes: No results for input(s): CKTOTAL, CKMB, CKMBINDEX, TROPONINI in the last 168 hours. BNP (last 3 results)  Recent Labs  11/08/14 1236 01/11/15 1942  BNP 300.9* 248.0*    ProBNP (last 3 results) No results for input(s): PROBNP in the last 8760 hours.  CBG:  Recent  Labs Lab 01/14/15 1130 01/14/15 1647 01/14/15 2125 01/15/15 0612 01/15/15 1150  GLUCAP 191* 164* 278* 157* 183*    No results found for this or any previous visit (from the past 240 hour(s)).   Studies: Ct Maxillofacial W/cm  01/15/2015   CLINICAL DATA:  Left sided facial swelling and erythema. Evaluate for dental abscess.  EXAM: CT MAXILLOFACIAL WITH CONTRAST  TECHNIQUE: Multidetector CT imaging of the maxillofacial structures was performed with intravenous contrast. Multiplanar CT image reconstructions were also generated. A small metallic BB was placed on the right temple in order to reliably differentiate right from left.  CONTRAST:  100mL OMNIPAQUE IOHEXOL 300 MG/ML IV.  COMPARISON:  None.  FINDINGS:  Metallic beam hardening streak artifact from the patient's dental work obscures fine detail on many of the images.  Large dental caries involving the 1st, 2nd and 3rd molars in the left side of the maxilla (teeth # 14, 15 and 16), each associated with periapical lucency. Teeth 15 and 16 have amalgam which creates abundant streak artifact. Edema/induration is present in the subcutaneous fat overlying the maxilla, extending upward into the infraorbital region. The left masseter muscle and the left pterygoid muscles are symmetric with those on the right. The fat in the masseter space is preserved. I do not identify a discrete abscess. Reactive level 2 nodes on the left, the largest measuring approximately 1.4 x 0.9 cm. No nodal masses.  The 3rd molar in the right side of the maxilla (tooth # 1) is incompletely erupted. No intrinsic abnormalities involving the facial bones otherwise. Temporomandibular joints intact.  Mucosal thickening involving the maxillary sinuses, left greater than right. Remaining paranasal sinuses, bilateral mastoid air cells and bilateral middle ear cavities well aerated. Midline bony nasal septum.  Visualized portions of the brain are unremarkable. Extensive bilateral carotid  siphon atherosclerosis.  IMPRESSION: 1. Large dental caries involving the 1st, 2nd and 3rd molars of the left side of the maxilla (teeth # 14, 15 and 16) each associated with periapical lucency consistent with periapical abscess. 2. Extensive edema/induration in the subcutaneous fat overlying the left side of the maxilla. A discrete soft tissue abscess is not identified. 3. Scattered reactive level 2 left cervical lymph nodes. No nodal masses. 4. Mild chronic bilateral maxillary sinus disease, left greater than right.   Electronically Signed   By: Hulan Saas M.D.   On: 01/15/2015 07:26    Scheduled Meds: . aspirin EC  81 mg Oral Daily  . carvedilol  3.125 mg Oral BID WC  . chlorhexidine  15 mL Mouth/Throat QID  . clindamycin (CLEOCIN) IV  600 mg Intravenous 3 times per day  . docusate sodium  100 mg Oral BID  . ferrous sulfate  325 mg Oral BID WC  . furosemide  40 mg Intravenous Daily  . insulin aspart  0-20 Units Subcutaneous TID WC  . insulin aspart  0-5 Units Subcutaneous QHS  . insulin glargine  50 Units Subcutaneous QHS  . saccharomyces boulardii  250 mg Oral BID  . sodium chloride  3 mL Intravenous Q12H   Continuous Infusions:   Principal Problem:   Acute on chronic diastolic CHF (congestive heart failure) Active Problems:   Morbid obesity   HTN (hypertension)   Atrial flutter   DM (diabetes mellitus), type 2 with renal complications   AKI (acute kidney injury)   Peripheral edema   Acute on chronic diastolic CHF (congestive heart failure), NYHA class 1   Epistaxis   Anemia, iron deficiency    Time spent: 30 min    Manie Bealer, [facility]  [facility] Pager [phone]. If 7PM-7AM, please contact night-coverage at [email or web address], password [email or web address] 01/15/2015, 4:07 PM  LOS: 2 days

## 2015-01-15 NOTE — Progress Notes (Signed)
Pt refusing am and pm dose of coreg and iron pt educated on medication and possible effects of his decision.

## 2015-01-15 NOTE — Progress Notes (Signed)
Attempted to wean pt off O2, pt cont. To place self on O2, thoughout the day. Pt educated on MD recommendations.

## 2015-01-16 ENCOUNTER — Encounter (HOSPITAL_COMMUNITY): Payer: Self-pay | Admitting: Dentistry

## 2015-01-16 ENCOUNTER — Inpatient Hospital Stay (HOSPITAL_COMMUNITY): Payer: Medicare Other

## 2015-01-16 DIAGNOSIS — K047 Periapical abscess without sinus: Secondary | ICD-10-CM

## 2015-01-16 DIAGNOSIS — R220 Localized swelling, mass and lump, head: Secondary | ICD-10-CM

## 2015-01-16 DIAGNOSIS — I5033 Acute on chronic diastolic (congestive) heart failure: Secondary | ICD-10-CM

## 2015-01-16 LAB — GLUCOSE, CAPILLARY
Glucose-Capillary: 138 mg/dL — ABNORMAL HIGH (ref 70–99)
Glucose-Capillary: 178 mg/dL — ABNORMAL HIGH (ref 70–99)
Glucose-Capillary: 163 mg/dL — ABNORMAL HIGH (ref 70–99)
Glucose-Capillary: 152 mg/dL — ABNORMAL HIGH (ref 70–99)

## 2015-01-16 LAB — ABO/RH: ABO/RH(D): A NEG

## 2015-01-16 LAB — CBC
HEMATOCRIT: 24.9 % — AB (ref 39.0–52.0)
HEMOGLOBIN: 7.9 g/dL — AB (ref 13.0–17.0)
MCH: 28.6 pg (ref 26.0–34.0)
MCHC: 31.7 g/dL (ref 30.0–36.0)
MCV: 90.2 fL (ref 78.0–100.0)
PLATELETS: 182 10*3/uL (ref 150–400)
RBC: 2.76 MIL/uL — ABNORMAL LOW (ref 4.22–5.81)
RDW: 15.2 % (ref 11.5–15.5)
WBC: 11 10*3/uL — ABNORMAL HIGH (ref 4.0–10.5)

## 2015-01-16 LAB — SURGICAL PCR SCREEN
MRSA, PCR: POSITIVE — AB
Staphylococcus aureus: POSITIVE — AB

## 2015-01-16 LAB — BASIC METABOLIC PANEL
Anion gap: 11 (ref 5–15)
BUN: 29 mg/dL — AB (ref 6–23)
CALCIUM: 8.8 mg/dL (ref 8.4–10.5)
CHLORIDE: 95 mmol/L — AB (ref 96–112)
CO2: 30 mmol/L (ref 19–32)
Creatinine, Ser: 1.5 mg/dL — ABNORMAL HIGH (ref 0.50–1.35)
GFR calc non Af Amer: 46 mL/min — ABNORMAL LOW (ref >90)
GFR, EST AFRICAN AMERICAN: 53 mL/min — AB (ref >90)
GLUCOSE: 146 mg/dL — AB (ref 70–99)
Potassium: 4.3 mmol/L (ref 3.5–5.1)
Sodium: 136 mmol/L (ref 135–145)

## 2015-01-16 LAB — TYPE AND SCREEN
ABO/RH(D): A NEG
ANTIBODY SCREEN: NEGATIVE

## 2015-01-16 MED ORDER — TORSEMIDE 20 MG PO TABS
40.0000 mg | ORAL_TABLET | Freq: Two times a day (BID) | ORAL | Status: DC
  Administered 2015-01-16 – 2015-01-18 (×4): 40 mg via ORAL
  Filled 2015-01-16 (×6): qty 2

## 2015-01-16 MED ORDER — TORSEMIDE 20 MG PO TABS
40.0000 mg | ORAL_TABLET | Freq: Every day | ORAL | Status: DC
  Administered 2015-01-16: 40 mg via ORAL
  Filled 2015-01-16: qty 2

## 2015-01-16 MED ORDER — SILVER NITRATE-POT NITRATE 75-25 % EX MISC
10.0000 | Freq: Once | CUTANEOUS | Status: AC
  Administered 2015-01-16: 10 via TOPICAL
  Filled 2015-01-16: qty 10

## 2015-01-16 MED ORDER — SILVER NITRATE-POT NITRATE 75-25 % EX MISC
1.0000 "application " | Freq: Once | CUTANEOUS | Status: AC
  Administered 2015-01-16: 1 via TOPICAL
  Filled 2015-01-16: qty 1

## 2015-01-16 MED ORDER — SALINE SPRAY 0.65 % NA SOLN
2.0000 | NASAL | Status: DC
  Administered 2015-01-16 (×3): 2 via NASAL
  Filled 2015-01-16: qty 44

## 2015-01-16 MED ORDER — OXYMETAZOLINE HCL 0.05 % NA SOLN
1.0000 | Freq: Two times a day (BID) | NASAL | Status: DC
  Administered 2015-01-16 – 2015-01-18 (×4): 1 via NASAL
  Filled 2015-01-16: qty 15

## 2015-01-16 MED ORDER — MIDAZOLAM HCL 2 MG/2ML IJ SOLN: 1.0000 mg | INTRAMUSCULAR | Status: DC | PRN

## 2015-01-16 MED ORDER — LIDOCAINE-PRILOCAINE 2.5-2.5 % EX CREA
1.0000 "application " | TOPICAL_CREAM | Freq: Once | CUTANEOUS | Status: DC
  Filled 2015-01-16: qty 5

## 2015-01-16 MED ORDER — SALINE SPRAY 0.65 % NA SOLN: 2.0000 | NASAL | Status: DC | PRN

## 2015-01-16 MED ORDER — MIDAZOLAM HCL 2 MG/2ML IJ SOLN: 0.5000 mg | INTRAMUSCULAR | Status: DC | PRN

## 2015-01-16 MED ORDER — OXYMETAZOLINE HCL 0.05 % NA SOLN
2.0000 | Freq: Once | NASAL | Status: AC
  Administered 2015-01-16: 2 via NASAL
  Filled 2015-01-16: qty 15

## 2015-01-16 MED ORDER — LIDOCAINE HCL 4 % EX SOLN
Freq: Once | CUTANEOUS | Status: DC
  Filled 2015-01-16: qty 50

## 2015-01-16 MED ORDER — CHLORHEXIDINE GLUCONATE CLOTH 2 % EX PADS
6.0000 | MEDICATED_PAD | Freq: Every day | CUTANEOUS | Status: DC
Start: 2015-01-17 — End: 2015-01-19
  Administered 2015-01-17 – 2015-01-18 (×2): 6 via TOPICAL

## 2015-01-16 MED ORDER — MUPIROCIN 2 % EX OINT
1.0000 "application " | TOPICAL_OINTMENT | Freq: Two times a day (BID) | CUTANEOUS | Status: DC
  Administered 2015-01-17 – 2015-01-18 (×4): 1 via NASAL
  Filled 2015-01-16: qty 22

## 2015-01-16 MED ORDER — HYDROCODONE-ACETAMINOPHEN 5-325 MG PO TABS: 1.0000 | ORAL_TABLET | ORAL | Status: DC | PRN

## 2015-01-16 MED ORDER — MORPHINE SULFATE 2 MG/ML IJ SOLN
1.0000 mg | Freq: Once | INTRAMUSCULAR | Status: AC
  Administered 2015-01-16: 1 mg via INTRAVENOUS
  Filled 2015-01-16: qty 1

## 2015-01-16 NOTE — Consult Note (Signed)
DENTAL CONSULTATION  Date of Consultation:  01/16/2015 Patient Name:   Mark Roberson Date of Birth:   01-05-1946 Medical Record Number: [id number]  VITALS: BP 143/58 mmHg  Pulse 65  Temp(Src) 98.6 F (37 C) (Oral)  Resp 18  Ht 6' (1.829 m)  Wt 332 lb 4.8 oz (150.73 kg)  BMI 45.06 kg/m2  SpO2 96%  CHIEF COMPLAINT: Patient was referred by Dr. Malachi BondsShort for evaluation of left facial swelling.   HPI: Mark GoldsMichael Tomson is a 69 year old male referred by Dr. Malachi BondsShort for dental consultation. Patient was admitted with acute exacerbation of heart failure. Patient was also complaining of left facial swelling. Dental consultation is requested to evaluate poor dentition and to provide treatment is indicated.  Patient has been having intermittent toothache symptoms for the past month or so. Patient has a history of left facial swelling for the past week or so.  Patient also has had more acute dental pain that is sharp and occurs in an intermittent fashion. Previously was 10 out of 10 in intensity but is now 3 out of 10 in intensity after morphine injection. The patient last saw a dentist approximately 10 years ago in [place]. Patient has not seen a dentist since he's been in [place]. Patient has been in [place] for approximately one month. Patient has no partial dentures.  PROBLEM LIST: Patient Active Problem List   Diagnosis Date Noted  . Apical abscess 01/15/2015  . Epistaxis 01/14/2015  . Anemia, iron deficiency 01/14/2015  . Acute on chronic diastolic CHF (congestive heart failure), NYHA class 1 01/13/2015  . Acute on chronic diastolic CHF (congestive heart failure) 01/11/2015  . AKI (acute kidney injury) 01/11/2015  . Peripheral edema 01/11/2015  . Renal insufficiency   . Chronic diastolic congestive heart failure 11/10/2014  . Acute respiratory failure with hypoxia 11/08/2014  . Hypoxia 11/08/2014  . Morbid obesity 11/08/2014  . HTN (hypertension) 11/08/2014  . Atrial flutter  11/08/2014  . DM (diabetes mellitus), type 2 with renal complications 11/08/2014    PMH: Past Medical History  Diagnosis Date  . CHF (congestive heart failure)   . Atrial fibrillation   . Diabetes mellitus without complication   . Hypertension   . Sleep apnea   . Asthma     PSH: Past Surgical History  Procedure Laterality Date  . 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 Wound debridement Right     ALLERGIES: Allergies  Allergen Reactions  . Percocet [Oxycodone-Acetaminophen] Other (See Comments)    hallucination  . Penicillins Hives    MEDICATIONS: Current Facility-Administered Medications  Medication Dose Route Frequency Provider Last Rate Last Dose  . 0.9 %  sodium chloride infusion  250 mL Intravenous PRN Haydee Monicaachal A David, MD      . albuterol (PROVENTIL) (2.5 MG/3ML) 0.083% nebulizer solution 2.5 mg  2.5 mg Nebulization Q6H PRN Haydee Monicaachal A David, MD   2.5 mg at 01/13/15 1515  . aspirin EC tablet 81 mg  81 mg Oral Daily Haydee Monicaachal A David, MD   81 mg at 01/16/15 1049  . bisacodyl (DULCOLAX) EC tablet 5 mg  5 mg Oral Daily PRN Renae FickleMackenzie Short, MD      . carvedilol (COREG) tablet 3.125 mg  3.125 mg Oral BID WC Renae FickleMackenzie Short, MD   3.125 mg at 01/16/15 1049  . chlorhexidine (PERIDEX) 0.12 % solution 15 mL  15 mL Mouth/Throat QID Renae FickleMackenzie Short, MD   15 mL at 01/16/15 1400  . clindamycin (CLEOCIN) IVPB  600 mg  600 mg Intravenous 3 times per day Renae Fickle, MD   600 mg at 01/16/15 1246  . docusate sodium (COLACE) capsule 100 mg  100 mg Oral BID Renae Fickle, MD   100 mg at 01/16/15 1049  . ferrous sulfate tablet 325 mg  325 mg Oral BID WC Renae Fickle, MD   325 mg at 01/14/15 1836  . insulin aspart (novoLOG) injection 0-20 Units  0-20 Units Subcutaneous TID WC Renae Fickle, MD   4 Units at 01/16/15 1139  . insulin aspart (novoLOG) injection 0-5 Units  0-5 Units Subcutaneous QHS Renae Fickle, MD   3 Units at 01/14/15 2130  . insulin aspart (novoLOG) injection 3 Units   3 Units Subcutaneous TID WC Renae Fickle, MD   3 Units at 01/16/15 1246  . insulin glargine (LANTUS) injection 55 Units  55 Units Subcutaneous QHS Renae Fickle, MD   55 Units at 01/15/15 2245  . lidocaine (XYLOCAINE) 4 % external solution   Topical Once Serena Colonel, MD      . lidocaine-prilocaine (EMLA) cream 1 application  1 application Topical Once Jairo Ben, MD      . midazolam (VERSED) injection 0.5-2 mg  0.5-2 mg Intravenous PRN Jairo Ben, MD      . midazolam (VERSED) injection 1-2 mg  1-2 mg Intravenous PRN Jairo Ben, MD      . ondansetron (ZOFRAN) injection 4 mg  4 mg Intravenous Q6H PRN Haydee Monica, MD      . oxymetazoline (AFRIN) 0.05 % nasal spray 1 spray  1 spray Each Nare BID Serena Colonel, MD   1 spray at 01/16/15 1000  . polyethylene glycol (MIRALAX / GLYCOLAX) packet 17 g  17 g Oral Daily PRN Renae Fickle, MD      . saccharomyces boulardii (FLORASTOR) capsule 250 mg  250 mg Oral BID Renae Fickle, MD   250 mg at 01/16/15 1049  . sodium chloride (OCEAN) 0.65 % nasal spray 1 spray  1 spray Each Nare PRN Renae Fickle, MD      . sodium chloride (OCEAN) 0.65 % nasal spray 2 spray  2 spray Each Nare Q1H while awake Serena Colonel, MD   2 spray at 01/16/15 1300  . sodium chloride 0.9 % injection 3 mL  3 mL Intravenous Q12H Haydee Monica, MD   3 mL at 01/16/15 1000  . sodium chloride 0.9 % injection 3 mL  3 mL Intravenous PRN Haydee Monica, MD      . torsemide (DEMADEX) tablet 40 mg  40 mg Oral Daily Renae Fickle, MD   40 mg at 01/16/15 1049    LABS: Lab Results  Component Value Date   WBC 11.0* 01/16/2015   HGB 7.9* 01/16/2015   HCT 24.9* 01/16/2015   MCV 90.2 01/16/2015   PLT 182 01/16/2015      Component Value Date/Time   NA 136 01/16/2015 0539   K 4.3 01/16/2015 0539   CL 95* 01/16/2015 0539   CO2 30 01/16/2015 0539   GLUCOSE 146* 01/16/2015 0539   BUN 29* 01/16/2015 0539   CREATININE 1.50* 01/16/2015 0539   CALCIUM 8.8 01/16/2015  0539   GFRNONAA 46* 01/16/2015 0539   GFRAA 53* 01/16/2015 0539   No results found for: INR, PROTIME No results found for: PTT  SOCIAL HISTORY: History   Social History  . Marital Status: Single    Spouse Name: N/A  . Number of Children: N/A  . Years of Education: N/A  Occupational History  . Not on file.   Social History Main Topics  . Smoking status: Never Smoker   . Smokeless tobacco: Never Used  . Alcohol Use: Yes     Comment: rare  . Drug Use: No  . Sexual Activity: Not on file   Other Topics Concern  . Not on file   Social History Narrative    FAMILY HISTORY: Family History  Problem Relation Age of Onset  . COPD Mother   . Heart disease Father   . Cancer Sister     breast  . Heart disease Brother     REVIEW OF SYSTEMS: Reviewed from chart for this admission.  DENTAL HISTORY: CHIEF COMPLAINT: Patient was referred by Dr. Malachi Bonds for evaluation of left facial swelling.   HPI: Mattheo Swindle is a 68 year old male referred by Dr. Malachi Bonds for dental consultation. Patient was admitted with acute exacerbation of heart failure. Patient was also complaining of left facial swelling. Dental consultation is requested to evaluate poor dentition and to provide treatment is indicated.  Patient has been having intermittent toothache symptoms for the past month or so. Patient has a history of left facial swelling for the past week or so.  Patient also has had more acute dental pain that is sharp and occurs in an intermittent fashion. Previously was 10 out of 10 in intensity but is now 3 out of 10 in intensity after morphine injection. The patient last saw a dentist approximately 10 years ago in [place]. Patient has not seen a dentist since he's been in [place]. Patient has been in [place] for approximately one month. Patient has no partial dentures.   DENTAL EXAMINATION: GENERAL: The patient is a well-developed, obese male in no acute distress. HEAD AND NECK:  Patient has significant left facial swelling involving the left maxillary/orbital area.  This swelling is nonfluctuant. Patient has submandibular lymphadenopathy. Patient has a maximum interincisal opening of 40 mm plus. Patient denies trismus symptoms. INTRAORAL EXAM: Patient has normal saliva. I do not see any area of fluctuance within the oral cavity.  DENTITION: The patient is missing tooth numbers 17, 30, and 32. Tooth #1 is impacted.  PERIODONTAL: Patient has chronic periodontitis with plaque and calculus accumulations, selective areas gingival recession and no obvious significant tooth mobility. DENTAL CARIES/SUBOPTIMAL RESTORATIONS: Patient has multiple extensive dental caries involving tooth numbers 14, 15, and 16. I would need a full series of dental radiographs to identify other incipient dental caries. ENDODONTIC: Patient with a history of acute pulpitis symptoms involving upper left quadrant posterior molar teeth. There is periapical pathology and radiolucency associated with the apices of tooth numbers 14, 15, and 16. Patient has had a previous root canal therapy associated with tooth #7 and 18. These teeth are asymptomatic. CROWN AND BRIDGE: Multiple crowns are noted. Recurrent caries associated with tooth #14. PROSTHODONTIC: Patient denies having any partial dentures. OCCLUSION: Patient has a poor occlusal scheme but a stable occlusion at this time.  RADIOGRAPHIC INTERPRETATION: An orthopantogram was taken on 01/16/2015. There are missing tooth numbers 17, 30, and 32. Tooth #1 was impacted. There are extensive dental caries associated with tooth numbers 14, 15, and 16. There is periapical pathology and radiolucency associated with the apices of #14, 15, and 16. There is previous root canal therapy associated with tooth numbers 7 and 18. There multiple crown restorations noted. There multiple restorations noted. The maxillary sinuses appear to be well aerated.   ASSESSMENTS: 1. Acute  on chronic diastolic heart failure  2. History of epistaxis 3. History of anticoagulant therapy currently discontinued 4. Left facial swelling 5. History of acute pulpitis  6. Chronic apical periodontitis 7. Dental caries 8. Chronic periodontitis of bone loss 9. Accretions 10. Missing teeth 11. Impacted tooth #1 12. Poor occlusal scheme and malocclusion  PLAN/RECOMMENDATIONS: 1. I discussed the risks, benefits, and complications of various treatment options with the patient in relationship to his medical and dental conditions. We discussed various treatment options to include no treatment, multiple extractions with alveoloplasty, pre-prosthetic surgery as indicated, periodontal therapy, dental restorations, root canal therapy, crown and bridge therapy, implant therapy, and replacement of missing teeth as indicated.  We also discussed referral to an oral surgeon and general dentist of his choice for evaluation for dental treatment. The patient currently wishes to proceed with  extraction of tooth numbers 14, 15, 16 with alveoloplasty in the operating room with general anesthesia. This has been scheduled for tomorrow morning at 7:30 AM. Patient is to continue on IV clindamycin antibiotic therapy at this time. Dr. Malachi Bonds may wish to consider discussion with infectious disease concerning additional antibiotic therapy as indicated. Anticoagulant therapy will be continued to be withheld at this time. The patient is to follow-up with a general dentist of his choice for exam, radiographs, and discussion of other dental treatment needs. Patient will need referral to an oral surgeon for extraction of tooth #1 in the future.   2. Discussion of findings with medical team and coordination of future medical and dental care as needed.   Charlynne Pander, DDS

## 2015-01-16 NOTE — Progress Notes (Signed)
Physical Therapy Treatment Patient Details Name: Mark GoldsMichael Roberson MRN: [id number] DOB: January 20, 1946 Today's Date: 01/16/2015    History of Present Illness The patient is a 69 year old male with history of morbid obesity, chronic diastolic heart failure, atrial fibrillation, diabetes mellitus type 2 who presented with generalized weakness, increased lower extremity edema, shortness of breath, 30 pound weight gain. He admitted that he was noncompliant with his diet over the last couple weeks because he went on vacation in [place]    PT Comments    Pt admitted with above diagnosis. Pt currently with functional limitations due to balance and endurance deficits.  Pt ambulates well overall.  Did not desat today either.   Pt will benefit from skilled PT to increase their independence and safety with mobility to allow discharge to the venue listed below.    Follow Up Recommendations  Home health PT;Supervision/Assistance - 24 hour [place]([facility] )     Equipment Recommendations  None recommended by PT    Recommendations for Other Services       Precautions / Restrictions Precautions Precautions: Fall Restrictions Weight Bearing Restrictions: No    Mobility  Bed Mobility Overal bed mobility: Independent                Transfers Overall transfer level: Independent                  Ambulation/Gait Ambulation/Gait assistance: Min guard Ambulation Distance (Feet): 150 Feet Assistive device: Rolling walker (2 wheeled) Gait Pattern/deviations: Step-through pattern;Decreased stride length;Wide base of support;Trunk flexed   Gait velocity interpretation: Below normal speed for age/gender General Gait Details: Slightly forward flexed posture. Good safety with RW.  Did not  Desat without O2 today.     Stairs            Wheelchair Mobility    Modified Rankin (Stroke Patients Only)       Balance Overall balance assessment: Needs assistance Sitting-balance support: No  upper extremity supported;Feet supported Sitting balance-Leahy Scale: Good     Standing balance support: Bilateral upper extremity supported;During functional activity Standing balance-Leahy Scale: Fair Standing balance comment: can stand statically without UE support without challenges.                      Cognition Arousal/Alertness: Awake/alert Behavior During Therapy: WFL for tasks assessed/performed Overall Cognitive Status: Within Functional Limits for tasks assessed                      Exercises General Exercises - Lower Extremity Long Arc Quad: AROM;Both;10 reps;Seated    General Comments        Pertinent Vitals/Pain Pain Assessment: No/denies pain  O2 on RA 95% and > throughout.      Home Living                      Prior Function            PT Goals (current goals can now be found in the care plan section) Acute Rehab PT Goals Patient Stated Goal: to go home Progress towards PT goals: Progressing toward goals    Frequency  Min 3X/week    PT Plan Current plan remains appropriate    Co-evaluation             End of Session Equipment Utilized During Treatment: Gait belt Activity Tolerance: Patient limited by fatigue Patient left: in chair;with call bell/phone within reach     Time:  1610-9604 PT Time Calculation (min) (ACUTE ONLY): 15 min  Charges:  $Gait Training: 8-22 mins                    G Codes:      Berline Lopes 02/01/2015, 4:27 PM [employer] Acute Rehabilitation [phone] (pager)

## 2015-01-16 NOTE — Progress Notes (Addendum)
The patient complained of 10/10 pain at 0530 this morning from his tooth abscess.  His left cheek is much more swollen than earlier in the shift.  He was given an ice pack and K. Craige CottaKirby was notified.  New orders were written and the patient received 1 mg of morphine.

## 2015-01-16 NOTE — Consult Note (Signed)
Reason for Consult: Nosebleeds Referring Physician: Janece Canterbury, MD  Mark Roberson is an 69 y.o. male.  HPI: Long history of recurring epistaxis, he typically from the left side anterior. It has been especially bad the past few days while in the hospital. He is typically on blood thinner but is currently off. He has also had left cheek swelling and significant history of dental problems.  Past Medical History  Diagnosis Date  . CHF (congestive heart failure)   . Atrial fibrillation   . Diabetes mellitus without complication   . Hypertension   . Sleep apnea   . Asthma     Past Surgical History  Procedure Laterality Date  . 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 Wound debridement Right     Family History  Problem Relation Age of Onset  . COPD Mother   . Heart disease Father   . Cancer Sister     breast  . Heart disease Brother     Social History:  reports that he has never smoked. He has never used smokeless tobacco. He reports that he drinks alcohol. He reports that he does not use illicit drugs.  Allergies:  Allergies  Allergen Reactions  . Percocet [Oxycodone-Acetaminophen] Other (See Comments)    hallucination  . Penicillins Hives    Medications: Reviewed  Results for orders placed or performed during the hospital encounter of 01/11/15 (from the past 48 hour(s))  Glucose, capillary     Status: Abnormal   Collection Time: 01/14/15 11:30 AM  Result Value Ref Range   Glucose-Capillary 191 (H) 70 - 99 mg/dL   Comment 1 Notify RN    Comment 2 Document in Chart   Glucose, capillary     Status: Abnormal   Collection Time: 01/14/15  4:47 PM  Result Value Ref Range   Glucose-Capillary 164 (H) 70 - 99 mg/dL   Comment 1 Notify RN    Comment 2 Document in Chart   Glucose, capillary     Status: Abnormal   Collection Time: 01/14/15  9:25 PM  Result Value Ref Range   Glucose-Capillary 278 (H) 70 - 99 mg/dL  Glucose, capillary     Status: Abnormal   Collection  Time: 01/15/15  6:12 AM  Result Value Ref Range   Glucose-Capillary 157 (H) 70 - 99 mg/dL  Basic metabolic panel     Status: Abnormal   Collection Time: 01/15/15  6:57 AM  Result Value Ref Range   Sodium 137 135 - 145 mmol/L   Potassium 4.5 3.5 - 5.1 mmol/L   Chloride 98 96 - 112 mmol/L   CO2 29 19 - 32 mmol/L   Glucose, Bld 151 (H) 70 - 99 mg/dL   BUN 35 (H) 6 - 23 mg/dL   Creatinine, Ser 1.38 (H) 0.50 - 1.35 mg/dL   Calcium 8.7 8.4 - 10.5 mg/dL   GFR calc non Af Amer 51 (L) >90 mL/min   GFR calc Af Amer 59 (L) >90 mL/min    Comment: (NOTE) The eGFR has been calculated using the CKD EPI equation. This calculation has not been validated in all clinical situations. eGFR's persistently <90 mL/min signify possible Chronic Kidney Disease.    Anion gap 10 5 - 15  CBC     Status: Abnormal   Collection Time: 01/15/15  6:57 AM  Result Value Ref Range   WBC 10.3 4.0 - 10.5 K/uL   RBC 2.75 (L) 4.22 - 5.81 MIL/uL   Hemoglobin 8.0 (  L) 13.0 - 17.0 g/dL   HCT 25.2 (L) 39.0 - 52.0 %   MCV 91.6 78.0 - 100.0 fL   MCH 29.1 26.0 - 34.0 pg   MCHC 31.7 30.0 - 36.0 g/dL   RDW 15.4 11.5 - 15.5 %   Platelets 177 150 - 400 K/uL  Glucose, capillary     Status: Abnormal   Collection Time: 01/15/15 11:50 AM  Result Value Ref Range   Glucose-Capillary 183 (H) 70 - 99 mg/dL  Glucose, capillary     Status: Abnormal   Collection Time: 01/15/15  4:32 PM  Result Value Ref Range   Glucose-Capillary 205 (H) 70 - 99 mg/dL   Comment 1 Notify RN    Comment 2 Document in Chart   Glucose, capillary     Status: Abnormal   Collection Time: 01/15/15 10:36 PM  Result Value Ref Range   Glucose-Capillary 199 (H) 70 - 99 mg/dL   Comment 1 Notify RN    Comment 2 Document in Chart   Basic metabolic panel     Status: Abnormal   Collection Time: 01/16/15  5:39 AM  Result Value Ref Range   Sodium 136 135 - 145 mmol/L   Potassium 4.3 3.5 - 5.1 mmol/L   Chloride 95 (L) 96 - 112 mmol/L   CO2 30 19 - 32 mmol/L    Glucose, Bld 146 (H) 70 - 99 mg/dL   BUN 29 (H) 6 - 23 mg/dL   Creatinine, Ser 1.50 (H) 0.50 - 1.35 mg/dL   Calcium 8.8 8.4 - 10.5 mg/dL   GFR calc non Af Amer 46 (L) >90 mL/min   GFR calc Af Amer 53 (L) >90 mL/min    Comment: (NOTE) The eGFR has been calculated using the CKD EPI equation. This calculation has not been validated in all clinical situations. eGFR's persistently <90 mL/min signify possible Chronic Kidney Disease.    Anion gap 11 5 - 15  CBC     Status: Abnormal   Collection Time: 01/16/15  5:39 AM  Result Value Ref Range   WBC 11.0 (H) 4.0 - 10.5 K/uL   RBC 2.76 (L) 4.22 - 5.81 MIL/uL   Hemoglobin 7.9 (L) 13.0 - 17.0 g/dL   HCT 24.9 (L) 39.0 - 52.0 %   MCV 90.2 78.0 - 100.0 fL   MCH 28.6 26.0 - 34.0 pg   MCHC 31.7 30.0 - 36.0 g/dL   RDW 15.2 11.5 - 15.5 %   Platelets 182 150 - 400 K/uL  Glucose, capillary     Status: Abnormal   Collection Time: 01/16/15  6:06 AM  Result Value Ref Range   Glucose-Capillary 138 (H) 70 - 99 mg/dL   Comment 1 Notify RN    Comment 2 Document in Chart     Ct Maxillofacial W/cm  01/15/2015   CLINICAL DATA:  Left sided facial swelling and erythema. Evaluate for dental abscess.  EXAM: CT MAXILLOFACIAL WITH CONTRAST  TECHNIQUE: Multidetector CT imaging of the maxillofacial structures was performed with intravenous contrast. Multiplanar CT image reconstructions were also generated. A small metallic BB was placed on the right temple in order to reliably differentiate right from left.  CONTRAST:  161m OMNIPAQUE IOHEXOL 300 MG/ML IV.  COMPARISON:  None.  FINDINGS: Metallic beam hardening streak artifact from the patient's dental work obscures fine detail on many of the images.  Large dental caries involving the 1st, 2nd and 3rd molars in the left side of the maxilla (teeth # 14, 15 and  16), each associated with periapical lucency. Teeth 15 and 16 have amalgam which creates abundant streak artifact. Edema/induration is present in the subcutaneous  fat overlying the maxilla, extending upward into the infraorbital region. The left masseter muscle and the left pterygoid muscles are symmetric with those on the right. The fat in the masseter space is preserved. I do not identify a discrete abscess. Reactive level 2 nodes on the left, the largest measuring approximately 1.4 x 0.9 cm. No nodal masses.  The 3rd molar in the right side of the maxilla (tooth # 1) is incompletely erupted. No intrinsic abnormalities involving the facial bones otherwise. Temporomandibular joints intact.  Mucosal thickening involving the maxillary sinuses, left greater than right. Remaining paranasal sinuses, bilateral mastoid air cells and bilateral middle ear cavities well aerated. Midline bony nasal septum.  Visualized portions of the brain are unremarkable. Extensive bilateral carotid siphon atherosclerosis.  IMPRESSION: 1. Large dental caries involving the 1st, 2nd and 3rd molars of the left side of the maxilla (teeth # 14, 15 and 16) each associated with periapical lucency consistent with periapical abscess. 2. Extensive edema/induration in the subcutaneous fat overlying the left side of the maxilla. A discrete soft tissue abscess is not identified. 3. Scattered reactive level 2 left cervical lymph nodes. No nodal masses. 4. Mild chronic bilateral maxillary sinus disease, left greater than right.   Electronically Signed   By: Evangeline Dakin M.D.   On: 01/15/2015 07:26    OYD:XAJOINOM except as listed in admit H&P  Blood pressure 130/59, pulse 69, temperature 98.9 F (37.2 C), temperature source Oral, resp. rate 18, height 6' (1.829 m), weight 150.73 kg (332 lb 4.8 oz), SpO2 94 %.  PHYSICAL EXAM: Overall appearance:  Healthy appearing, in no distress Head:  Normocephalic, atraumatic. Significant tense tender swelling of the left cheek area. Ears: External ears looked healthy. Nose: External nose is healthy in appearance. Internal nasal exam reveals bilateral anterior  septal scabs and superficial vessels. Oral Cavity:  There are no mucosal lesions or masses identified. Multiple dental restorations. Granulation tissue adjacent to the left maxillary molar. Oral Pharynx/Hypopharynx/Larynx: no signs of any mucosal lesions or masses identified.  Larynx/Hypopharynx:  Neuro:  No identifiable neurologic deficits. Neck: No palpable neck masses.  Studies Reviewed: none  Procedures: The nasal cavities were topically anesthetized with Afrin/Xylocaine solution. Silver nitrate cautery was performed on bilateral anterior septum. There was no bleeding.   Assessment/Plan: Is currently off blood thinner. Cauterization was performed. Start using saline spray. Avoid Afrin unless there is active bleeding. He is going to see Dr. Lawana Chambers for treatment of the dental infection.  Kobee Medlen 01/16/2015, 10:56 AM

## 2015-01-16 NOTE — Progress Notes (Signed)
[facility] PROGRESS NOTE  Mark GoldsMichael Roberson [id number] DOB: 1945/10/03 DOA: 01/11/2015 PCP: Judie BonusKollar, Elizabeth A, MD  Brief Summary  The patient is a 69 year old male with history of morbid obesity, chronic diastolic heart failure, atrial fibrillation, diabetes mellitus type 2 who presented with generalized weakness, increased lower extremity edema, shortness of breath, 30 pound weight gain.   He admitted that he was noncompliant with his diet over the last couple weeks because he went on vacation in [place]. He states he was compliant with his medications, however, he responded briskly to IV lasix.    Assessment/Plan  Acute hypoxic respiratory failure secondary to acute on chronic diastolic heart failure, creatinine trending up slightly -   Recent echocardiogram demonstrated ejection fraction of 60-65% with mild MR, moderately dilated left atrium, moderately dilated RV , peak PA pressure 50 mmHg -   Diuresed 1 L yesterday -  Weight stable at 332-lbs -   D/c Lasix 40 mg and start torsemide 40mg  po BID  Epistaxis, has been going on every other day at home and had episode of "gushing" from both nares that lasted about 5 hours overnight from 4/22-4/23 and his hemoglobin trended down to 8. -  Patient declined cold compresses, pressure, or packing by nursing staff -  Continue afrin and start nasal saline and remove O2 if possible -  Advised patient to avoid blowing his nose -  Xarelto held -  Appreciate ENT assistance:  Silver nitrate cautery performed on bilateral anterior septum  Multiple periapical tooth abscesses with extensive surrounding edema at least 3-4cm in diameter with no clear abscess formation -  CT face on 4/23 >> apical abscesses with significant soft tissue swelling -  Continue clindamycin and peridex -  Continue florastor -  Orthopantogram:  Multiple abscesses -  Appreciate Dentistry assistance:  Dental extraction in AM  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 Explained untreated osa can worsen heart failure issues.  HTN with hypotension -  Place hold parameter for carvedilol and decreased dose to 3.125mg   Atrial flutter, junctional bradycardia - continue carvedilol -  Hold xarelto due to epistaxis  DM (diabetes mellitus)- A1c 6.6 11/14/2014, CBG at goal -  Increase lantus to 55 units -  Continue SSI to high dose with qhs insulin -  Continue aspart 3 units with meals  Skin tear and stage 2 pressure ulcer, present at time of admission -  Appreciate wound care assistance -  Continue hydrocolloid dressing  Acute blood loss anemia superimposed on iron deficiency anemia -  Hemoglobin at baseline of 9.5mg /dl -  Iron studies c/w iron deficiency (possibly due to frequent nose bleeds), b12 455, folate 15, TSH 5.653 -  Start iron supplementation  Hyperkalemia, likely spurious from mild hemolysis.  Patient diuresed 5-1/2 L and was not given any potassium repletion.  resolved  Sick euthyroid.  FT3/fT4 wnl.  TSH mildly elevated -  Repeat TFTs in 3-4 weeks as outpatient  Mild leukocytosis likely related to his apical abscess  Flat affect.  ddx includes depression, parkinson's disease -  No tremor to suggest parkinson's  Diet:  Diabetic, low sodium Access:  PIV IVF:  off Proph:  SCDs  Code Status: full Family Communication: patient alone Disposition Plan: pending further diuresis, treatment for apical abscess and nose bleed  Consultants:  none  Procedures:  CXR  CT maxillofacial  Antibiotics:  none   HPI/Subjective:  Face pain decreased after dose of morphine.  Swelling is about the same, redness has improved.  Denies further nose bleeds.    Objective: Filed Vitals:   01/16/15 0516 01/16/15  1049 01/16/15 1426 01/16/15 1651  BP: 112/42 130/59 143/58 113/48  Pulse: 65 69 65 65  Temp: 98.9 F (37.2 C)  98.6 F (37 C)   TempSrc: Oral  Oral   Resp: 18  18   Height:      Weight: 150.73 kg (332 lb 4.8 oz)     SpO2: 94%  96%     Intake/Output Summary (Last 24 hours) at 01/16/15 1736 Last data filed at 01/16/15 1321  Gross per 24 hour  Intake   1640 ml  Output    500 ml  Net   1140 ml   Filed Weights   01/14/15 1044 01/15/15 0501 01/16/15 0516  Weight: 151.955 kg (335 lb) 150.776 kg (332 lb 6.4 oz) 150.73 kg (332 lb 4.8 oz)    Exam:   General:   Obese male, No acute distress  HEENT:  NCAT, MMM.  Persistent swelling of left cheek, minimal residual erythemaPoor dentition  Cardiovascular:  RRR, nl S1, S2 no mrg, 2+ pulses, warm extremities  Respiratory:  CTAB, no increased WOB  Abdomen:   NABS, soft, NT/ND  MSK:   Normal tone and bulk, 1 + soft pitting LEE of bilateral lower extremities  Neuro:  Grossly intact  Data Reviewed: Basic Metabolic Panel:  Recent Labs Lab 01/12/15 0604 01/13/15 0535 01/14/15 0513 01/15/15 0657 01/16/15 0539  NA 138 139 139 137 136  K 5.5* 4.3 4.4 4.5 4.3  CL 100 100 100 98 95*  CO2 28 30 28 29 30   GLUCOSE 168* 140* 184* 151* 146*  BUN 73* 52* 41* 35* 29*  CREATININE 1.80* 1.42* 1.35 1.38* 1.50*  CALCIUM 8.7 8.8 9.0 8.7 8.8   Liver Function Tests: No results for input(s): AST, ALT, ALKPHOS, BILITOT, PROT, ALBUMIN in the last 168 hours. No results for input(s): LIPASE, AMYLASE in the last 168 hours. No results for input(s): AMMONIA in the last 168 hours. CBC:  Recent Labs Lab 01/11/15 1942 01/11/15 1952 01/14/15 0513 01/15/15 0657 01/16/15 0539  WBC 9.9  --  12.1* 10.3 11.0*  NEUTROABS 6.7  --   --   --   --   HGB 8.8* 9.5* 9.2* 8.0* 7.9*  HCT 27.6* 28.0* 29.3* 25.2* 24.9*  MCV 90.2  --  91.0 91.6 90.2  PLT 185  --  221 177 182   Cardiac Enzymes: No results for input(s): CKTOTAL, CKMB, CKMBINDEX, TROPONINI in  the last 168 hours. BNP (last 3 results)  Recent Labs  11/08/14 1236 01/11/15 1942  BNP 300.9* 248.0*    ProBNP (last 3 results) No results for input(s): PROBNP in the last 8760 hours.  CBG:  Recent Labs Lab 01/15/15 1632 01/15/15 2236 01/16/15 0606 01/16/15 1110 01/16/15 1630  GLUCAP 205* 199* 138* 178* 163*    No results found for this or any previous visit (from the past 240 hour(s)).   Studies: Dg Orthopantogram  01/16/2015   CLINICAL DATA:  Left facial swelling.  EXAM: ORTHOPANTOGRAM/PANORAMIC  COMPARISON:  Face CT from 2 days ago  FINDINGS: Large cavities in the left upper molars with periapical erosions.  The mandible is intact and located. No mandibular erosion. Gross symmetric aeration of the bilateral maxillary sinuses.  IMPRESSION: Large cavities in the left upper molars with periapical erosions.   Electronically Signed   By: Marnee Spring M.D.   On: 01/16/2015 12:38   Ct Maxillofacial W/cm  01/15/2015   CLINICAL DATA:  Left sided facial swelling and erythema. Evaluate for dental abscess.  EXAM: CT MAXILLOFACIAL WITH CONTRAST  TECHNIQUE: Multidetector CT imaging of the maxillofacial structures was performed with intravenous contrast. Multiplanar CT image reconstructions were also generated. A small metallic BB was placed on the right temple in order to reliably differentiate right from left.  CONTRAST:  OMNIPAQUE IOHEXOL 300 MG/ML IV.  COMPARISON:  None.  FINDINGS: Metallic beam hardening streak artifact from the patient's dental work obscures fine detail on many of the images.  Large dental caries involving the 1st, 2nd and 3rd molars in the left side of the maxilla (teeth # 14, 15 and 16), each associated with periapical lucency. Teeth 15 and 16 have amalgam which creates abundant streak artifact. Edema/induration is present in the subcutaneous fat overlying the maxilla, extending upward into the infraorbital region. The left masseter muscle and the left pterygoid  muscles are symmetric with those on the right. The fat in the masseter space is preserved. I do not identify a discrete abscess. Reactive level 2 nodes on the left, the largest measuring approximately 1.4 x 0.9 cm. No nodal masses.  The 3rd molar in the right side of the maxilla (tooth # 1) is incompletely erupted. No intrinsic abnormalities involving the facial bones otherwise. Temporomandibular joints intact.  Mucosal thickening involving the maxillary sinuses, left greater than right. Remaining paranasal sinuses, bilateral mastoid air cells and bilateral middle ear cavities well aerated. Midline bony nasal septum.  Visualized portions of the brain are unremarkable. Extensive bilateral carotid siphon atherosclerosis.  IMPRESSION: 1. Large dental caries involving the 1st, 2nd and 3rd molars of the left side of the maxilla (teeth # 14, 15 and 16) each associated with periapical lucency consistent with periapical abscess. 2. Extensive edema/induration in the subcutaneous fat overlying the left side of the maxilla. A discrete soft tissue abscess is not identified. 3. Scattered reactive level 2 left cervical lymph nodes. No nodal masses. 4. Mild chronic bilateral maxillary sinus disease, left greater than right.   Electronically Signed   By: Hulan Saas M.D.   On: 01/15/2015 07:26    Scheduled Meds: . aspirin EC  81 mg Oral Daily  . carvedilol  3.125 mg Oral BID WC  . chlorhexidine  15 mL Mouth/Throat QID  . clindamycin (CLEOCIN) IV  600 mg Intravenous 3 times per day  . docusate sodium  100 mg Oral BID  . ferrous sulfate  325 mg Oral BID WC  . insulin aspart  0-20 Units Subcutaneous TID WC  . insulin aspart  0-5 Units Subcutaneous QHS  . insulin aspart  3 Units Subcutaneous TID WC  . insulin glargine  55 Units Subcutaneous QHS  . lidocaine   Topical Once  . lidocaine-prilocaine  1 application Topical Once  . oxymetazoline  1 spray Each Nare BID  . saccharomyces boulardii  250 mg Oral BID  .  sodium chloride  3 mL Intravenous Q12H  . torsemide  40 mg Oral Daily   Continuous Infusions:   Principal Problem:   Acute on chronic diastolic CHF (congestive heart failure) Active Problems:   Morbid obesity   HTN (hypertension)   Atrial flutter   DM (diabetes mellitus), type 2 with renal complications   AKI (acute kidney injury)  Peripheral edema   Acute on chronic diastolic CHF (congestive heart failure), NYHA class 1   Epistaxis   Anemia, iron deficiency   Apical abscess    Time spent: 30 min    Takeira Yanes, [facility]  [facility] Pager [phone]. If 7PM-7AM, please contact night-coverage at [email or web address], password [facility] 01/16/2015, 5:36 PM  LOS: 3 days

## 2015-01-16 NOTE — Progress Notes (Signed)
CARE MANAGEMENT NOTE 01/16/2015  Patient:  Mark Roberson,Mark Roberson   Account Number:  [id number]  Date Initiated:  01/16/2015  Documentation initiated by:  [facility],Trumaine Wimer  Subjective/Objective Assessment:   CHF, tooth extractions     Action/Plan:   Anticipated DC Date:     Anticipated DC Plan:  HOME W HOME HEALTH SERVICES      DC Planning Services  CM consult      [facility]  [facility] Of [facility]vcs/PTA Provider  HOME HEALTH   Choice offered to / List presented to:  C-1 Patient        HH arranged  HH-1 RN      [facility]   Status of service:  In process, will continue to follow Medicare Important Message given?  YES (If response is "NO", the following Medicare IM given date fields will be blank) Date Medicare IM given:  01/16/2015 Medicare IM given by:  [facility]Deziyah Arvin Date Additional Medicare IM given:   Additional Medicare IM given by:    Discharge Disposition:    Per UR Regulation:    If discussed at Long Length of Stay Meetings, dates discussed:    Comments:  01/16/2015 1600 NCM spoke to pt and [facility]. Notified [facility] for HH. Pt has RW at home. [facility] Isidoro DonningAlesia Genaro Bekker RN CCM Case Mgmt phone [phone]

## 2015-01-17 ENCOUNTER — Encounter (HOSPITAL_COMMUNITY): Payer: Self-pay | Admitting: Certified Registered Nurse Anesthetist

## 2015-01-17 ENCOUNTER — Encounter (HOSPITAL_COMMUNITY)
Admission: EM | Disposition: A | Discharge status: Home / Self Care | Payer: Self-pay | Source: Home / Self Care | Attending: Internal Medicine

## 2015-01-17 ENCOUNTER — Inpatient Hospital Stay (HOSPITAL_COMMUNITY): Payer: Medicare Other | Admitting: Anesthesiology

## 2015-01-17 ENCOUNTER — Inpatient Hospital Stay (HOSPITAL_COMMUNITY): Payer: Medicare Other

## 2015-01-17 DIAGNOSIS — K029 Dental caries, unspecified: Secondary | ICD-10-CM

## 2015-01-17 DIAGNOSIS — K045 Chronic apical periodontitis: Secondary | ICD-10-CM

## 2015-01-17 DIAGNOSIS — K047 Periapical abscess without sinus: Secondary | ICD-10-CM

## 2015-01-17 HISTORY — PX: MULTIPLE EXTRACTIONS WITH ALVEOLOPLASTY: SHX5342

## 2015-01-17 LAB — GLUCOSE, CAPILLARY
GLUCOSE-CAPILLARY: 132 mg/dL — AB (ref 70–99)
Glucose-Capillary: 103 mg/dL — ABNORMAL HIGH (ref 70–99)
Glucose-Capillary: 106 mg/dL — ABNORMAL HIGH (ref 70–99)
Glucose-Capillary: 118 mg/dL — ABNORMAL HIGH (ref 70–99)
Glucose-Capillary: 274 mg/dL — ABNORMAL HIGH (ref 70–99)
Glucose-Capillary: 381 mg/dL — ABNORMAL HIGH (ref 70–99)

## 2015-01-17 LAB — BASIC METABOLIC PANEL
Anion gap: 12 (ref 5–15)
BUN: 32 mg/dL — AB (ref 6–23)
CO2: 30 mmol/L (ref 19–32)
Calcium: 8.7 mg/dL (ref 8.4–10.5)
Chloride: 96 mmol/L (ref 96–112)
Creatinine, Ser: 1.63 mg/dL — ABNORMAL HIGH (ref 0.50–1.35)
GFR, EST AFRICAN AMERICAN: 48 mL/min — AB (ref >90)
GFR, EST NON AFRICAN AMERICAN: 42 mL/min — AB (ref >90)
Glucose, Bld: 106 mg/dL — ABNORMAL HIGH (ref 70–99)
Potassium: 4.1 mmol/L (ref 3.5–5.1)
SODIUM: 138 mmol/L (ref 135–145)

## 2015-01-17 LAB — CBC
HEMATOCRIT: 23.9 % — AB (ref 39.0–52.0)
HEMOGLOBIN: 7.7 g/dL — AB (ref 13.0–17.0)
MCH: 28.9 pg (ref 26.0–34.0)
MCHC: 32.2 g/dL (ref 30.0–36.0)
MCV: 89.8 fL (ref 78.0–100.0)
Platelets: 165 10*3/uL (ref 150–400)
RBC: 2.66 MIL/uL — ABNORMAL LOW (ref 4.22–5.81)
RDW: 15.3 % (ref 11.5–15.5)
WBC: 9 10*3/uL (ref 4.0–10.5)

## 2015-01-17 SURGERY — MULTIPLE EXTRACTION WITH ALVEOLOPLASTY
Anesthesia: Monitor Anesthesia Care

## 2015-01-17 MED ORDER — LACTATED RINGERS IV SOLN
INTRAVENOUS | Status: DC
  Administered 2015-01-17: 10:00:00 via INTRAVENOUS

## 2015-01-17 MED ORDER — ONDANSETRON HCL 4 MG/2ML IJ SOLN
INTRAMUSCULAR | Status: AC
  Filled 2015-01-17: qty 2

## 2015-01-17 MED ORDER — PROPOFOL 10 MG/ML IV BOLUS
INTRAVENOUS | Status: AC
  Filled 2015-01-17: qty 20

## 2015-01-17 MED ORDER — LIDOCAINE HCL (CARDIAC) 20 MG/ML IV SOLN
INTRAVENOUS | Status: DC | PRN
  Administered 2015-01-17: 80 mg via INTRAVENOUS

## 2015-01-17 MED ORDER — LACTATED RINGERS IV SOLN
INTRAVENOUS | Status: DC | PRN
Start: 2015-01-17 — End: 2015-01-17
  Administered 2015-01-17: 07:00:00 via INTRAVENOUS

## 2015-01-17 MED ORDER — PHENYLEPHRINE 40 MCG/ML (10ML) SYRINGE FOR IV PUSH (FOR BLOOD PRESSURE SUPPORT)
PREFILLED_SYRINGE | INTRAVENOUS | Status: AC
  Filled 2015-01-17: qty 10

## 2015-01-17 MED ORDER — BUPIVACAINE-EPINEPHRINE (PF) 0.5% -1:200000 IJ SOLN
INTRAMUSCULAR | Status: AC
  Filled 2015-01-17: qty 7.2

## 2015-01-17 MED ORDER — OXYCODONE HCL 5 MG/5ML PO SOLN: 5.0000 mg | Freq: Once | ORAL | Status: DC | PRN

## 2015-01-17 MED ORDER — MIDAZOLAM HCL 2 MG/2ML IJ SOLN
INTRAMUSCULAR | Status: AC
  Filled 2015-01-17: qty 2

## 2015-01-17 MED ORDER — MORPHINE SULFATE 2 MG/ML IJ SOLN: 2.0000 mg | INTRAMUSCULAR | Status: DC | PRN

## 2015-01-17 MED ORDER — DEXAMETHASONE SODIUM PHOSPHATE 4 MG/ML IJ SOLN
INTRAMUSCULAR | Status: DC | PRN
  Administered 2015-01-17: 4 mg via INTRAVENOUS

## 2015-01-17 MED ORDER — PROPOFOL 10 MG/ML IV BOLUS
INTRAVENOUS | Status: DC | PRN
  Administered 2015-01-17: 200 mg via INTRAVENOUS

## 2015-01-17 MED ORDER — LIDOCAINE-EPINEPHRINE 2 %-1:100000 IJ SOLN
INTRAMUSCULAR | Status: DC | PRN
  Administered 2015-01-17 (×2): 1.7 mL

## 2015-01-17 MED ORDER — HYDROMORPHONE HCL 1 MG/ML IJ SOLN: 0.2500 mg | INTRAMUSCULAR | Status: DC | PRN

## 2015-01-17 MED ORDER — FENTANYL CITRATE (PF) 250 MCG/5ML IJ SOLN
INTRAMUSCULAR | Status: AC
  Filled 2015-01-17: qty 5

## 2015-01-17 MED ORDER — HEMOSTATIC AGENTS (NO CHARGE) OPTIME
TOPICAL | Status: DC | PRN
  Administered 2015-01-17: 1 via TOPICAL

## 2015-01-17 MED ORDER — EPHEDRINE SULFATE 50 MG/ML IJ SOLN
INTRAMUSCULAR | Status: AC
  Filled 2015-01-17: qty 1

## 2015-01-17 MED ORDER — SENNA 8.6 MG PO TABS
2.0000 | ORAL_TABLET | Freq: Every day | ORAL | Status: DC
  Filled 2015-01-17: qty 2

## 2015-01-17 MED ORDER — ONDANSETRON HCL 4 MG/2ML IJ SOLN
INTRAMUSCULAR | Status: DC | PRN
Start: 2015-01-17 — End: 2015-01-17
  Administered 2015-01-17: 4 mg via INTRAVENOUS

## 2015-01-17 MED ORDER — FENTANYL CITRATE (PF) 100 MCG/2ML IJ SOLN
INTRAMUSCULAR | Status: DC | PRN
  Administered 2015-01-17: 100 ug via INTRAVENOUS

## 2015-01-17 MED ORDER — SUCCINYLCHOLINE CHLORIDE 20 MG/ML IJ SOLN
INTRAMUSCULAR | Status: AC
  Filled 2015-01-17: qty 1

## 2015-01-17 MED ORDER — SUCCINYLCHOLINE CHLORIDE 20 MG/ML IJ SOLN
INTRAMUSCULAR | Status: DC | PRN
Start: 2015-01-17 — End: 2015-01-17
  Administered 2015-01-17: 100 mg via INTRAVENOUS

## 2015-01-17 MED ORDER — DEXAMETHASONE SODIUM PHOSPHATE 4 MG/ML IJ SOLN
INTRAMUSCULAR | Status: AC
  Filled 2015-01-17: qty 1

## 2015-01-17 MED ORDER — OXYMETAZOLINE HCL 0.05 % NA SOLN
NASAL | Status: AC
  Filled 2015-01-17: qty 15

## 2015-01-17 MED ORDER — LIDOCAINE HCL (CARDIAC) 20 MG/ML IV SOLN
INTRAVENOUS | Status: AC
  Filled 2015-01-17: qty 10

## 2015-01-17 MED ORDER — CLINDAMYCIN PHOSPHATE 600 MG/50ML IV SOLN
600.0000 mg | INTRAVENOUS | Status: DC
Start: 2015-01-17 — End: 2015-01-17
  Filled 2015-01-17: qty 50

## 2015-01-17 MED ORDER — PROMETHAZINE HCL 25 MG/ML IJ SOLN: 6.2500 mg | INTRAMUSCULAR | Status: DC | PRN

## 2015-01-17 MED ORDER — PHENYLEPHRINE HCL 10 MG/ML IJ SOLN
INTRAMUSCULAR | Status: DC | PRN
  Administered 2015-01-17: 40 ug via INTRAVENOUS
  Administered 2015-01-17: 80 ug via INTRAVENOUS
  Administered 2015-01-17: 40 ug via INTRAVENOUS
  Administered 2015-01-17: 80 ug via INTRAVENOUS

## 2015-01-17 MED ORDER — POLYETHYLENE GLYCOL 3350 17 G PO PACK
17.0000 g | PACK | Freq: Two times a day (BID) | ORAL | Status: DC
  Administered 2015-01-17 (×2): 17 g via ORAL
  Filled 2015-01-17 (×4): qty 1

## 2015-01-17 MED ORDER — ROCURONIUM BROMIDE 50 MG/5ML IV SOLN
INTRAVENOUS | Status: AC
  Filled 2015-01-17: qty 1

## 2015-01-17 MED ORDER — 0.9 % SODIUM CHLORIDE (POUR BTL) OPTIME
TOPICAL | Status: DC | PRN
  Administered 2015-01-17: 1000 mL

## 2015-01-17 MED ORDER — OXYCODONE HCL 5 MG PO TABS: 5.0000 mg | ORAL_TABLET | Freq: Once | ORAL | Status: DC | PRN

## 2015-01-17 MED ORDER — SODIUM CHLORIDE 0.9 % IJ SOLN
INTRAMUSCULAR | Status: AC
  Filled 2015-01-17: qty 10

## 2015-01-17 MED ORDER — LIDOCAINE-EPINEPHRINE 2 %-1:100000 IJ SOLN
INTRAMUSCULAR | Status: AC
  Filled 2015-01-17: qty 10.2

## 2015-01-17 SURGICAL SUPPLY — 34 items
ALCOHOL 70% 16 OZ (MISCELLANEOUS) ×3 IMPLANT
ATTRACTOMAT 16X20 MAGNETIC DRP (DRAPES) ×3 IMPLANT
BLADE SURG 15 STRL LF DISP TIS (BLADE) ×2 IMPLANT
BLADE SURG 15 STRL SS (BLADE) ×4
COVER SURGICAL LIGHT HANDLE (MISCELLANEOUS) ×3 IMPLANT
GAUZE PACKING FOLDED 2  STR (GAUZE/BANDAGES/DRESSINGS) ×2
GAUZE PACKING FOLDED 2 STR (GAUZE/BANDAGES/DRESSINGS) ×1 IMPLANT
GAUZE SPONGE 4X4 16PLY XRAY LF (GAUZE/BANDAGES/DRESSINGS) ×3 IMPLANT
GLOVE BIOGEL PI IND STRL 6 (GLOVE) ×1 IMPLANT
GLOVE BIOGEL PI INDICATOR 6 (GLOVE) ×2
GLOVE SURG ORTHO 8.0 STRL STRW (GLOVE) ×3 IMPLANT
GLOVE SURG SS PI 6.0 STRL IVOR (GLOVE) ×3 IMPLANT
GOWN STRL REUS W/ TWL LRG LVL3 (GOWN DISPOSABLE) ×1 IMPLANT
GOWN STRL REUS W/TWL 2XL LVL3 (GOWN DISPOSABLE) ×3 IMPLANT
GOWN STRL REUS W/TWL LRG LVL3 (GOWN DISPOSABLE) ×2
HEMOSTAT SURGICEL 2X14 (HEMOSTASIS) IMPLANT
KIT BASIN OR (CUSTOM PROCEDURE TRAY) ×3 IMPLANT
KIT ROOM TURNOVER OR (KITS) ×3 IMPLANT
MANIFOLD NEPTUNE WASTE (CANNULA) ×3 IMPLANT
NEEDLE BLUNT 16X1.5 OR ONLY (NEEDLE) ×3 IMPLANT
NS IRRIG 1000ML POUR BTL (IV SOLUTION) ×3 IMPLANT
PACK EENT II TURBAN DRAPE (CUSTOM PROCEDURE TRAY) ×3 IMPLANT
PAD ARMBOARD 7.5X6 YLW CONV (MISCELLANEOUS) ×3 IMPLANT
SPONGE GAUZE 4X4 12PLY STER LF (GAUZE/BANDAGES/DRESSINGS) ×3 IMPLANT
SPONGE SURGIFOAM ABS GEL 100 (HEMOSTASIS) IMPLANT
SPONGE SURGIFOAM ABS GEL 12-7 (HEMOSTASIS) IMPLANT
SPONGE SURGIFOAM ABS GEL SZ50 (HEMOSTASIS) ×3 IMPLANT
SUCTION FRAZIER TIP 10 FR DISP (SUCTIONS) ×3 IMPLANT
SUT CHROMIC 3 0 PS 2 (SUTURE) ×3 IMPLANT
SYR 50ML SLIP (SYRINGE) ×3 IMPLANT
TOWEL OR 17X26 10 PK STRL BLUE (TOWEL DISPOSABLE) IMPLANT
TUBE CONNECTING 12'X1/4 (SUCTIONS) ×1
TUBE CONNECTING 12X1/4 (SUCTIONS) ×2 IMPLANT
YANKAUER SUCT BULB TIP NO VENT (SUCTIONS) ×3 IMPLANT

## 2015-01-17 NOTE — Op Note (Signed)
OPERATIVE REPORT  Patient:            Mark Roberson Date of Birth:  [birth year] MRN:                [id number]   DATE OF PROCEDURE:  01/17/2015  PREOPERATIVE DIAGNOSES: 1. Left facial swelling 2. Periapical abscess without sinus 3. Chronic apical periodontitis 4. Dental caries  POSTOPERATIVE DIAGNOSES: 1. Left facial swelling 2. Periapical abscess without sinus 3. Chronic apical periodontitis 4. Dental caries  OPERATIONS: 1. Multiple extraction of tooth numbers 14, 15, and 16 with alveoloplasty.   SURGEON: Charlynne Pander, DDS  ASSISTANT: Rory Percy, (dental assistant)  ANESTHESIA: General anesthesia via oral endotracheal tube.  MEDICATIONS: 1. Clindamycin 600 mg IV prior to invasive dental procedures. 2. Local anesthesia with a total utilization of 2 carpules each containing 34 mg of lidocaine with 0.017 mg of epinephrine  SPECIMENS: There are 3 teeth that were discarded.  DRAINS: None  CULTURES: None  COMPLICATIONS: None   ESTIMATED BLOOD LOSS: 15 mLs.  INTRAVENOUS FLUIDS: Lactated ringers solution per the anesthesia team  INDICATIONS: The patient was recently diagnosed with left facial swelling.  A dental consultation was then requested to evaluate poor dentition and provide treatment is indicated.  The patient was examined and treatment planned for extraction of multiple teeth with alveoloplasty in the operating room with general anesthesia.  This treatment plan was formulated to decrease the risks and complications associated with dental infection from further affecting the patient's systemic health.  OPERATIVE FINDINGS: Patient was examined operating room number 6.  The teeth were identified for extraction. The patient was noted be affected by left facial swelling, periapical abscess without sinus, chronic apical periodontitis, and extensive dental caries of tooth numbers 14, 15, and 16.  DESCRIPTION OF PROCEDURE: Patient was brought to the main  operating room number 6. Patient was then placed in the supine position on the operating table. General anesthesia was then induced per the anesthesia team. The patient was then prepped and draped in the usual manner for dental medicine procedure. A timeout was performed. The patient was identified and procedures were verified. A throat pack was placed at this time. The oral cavity was then thoroughly examined with the findings noted above. The patient was then ready for dental medicine procedure as follows:  Local anesthesia was then administered sequentially with a total utilization of 2 carpules each containing 34 mg of lidocaine with 0.017 mg of epinephrine   The Maxillary left quadrants was first approached. Anesthesia was then delivered utilizing infiltration with lidocaine with epinephrine. A #15 blade incision was then made from the distal of #16 and extended to the mesial #14.  A  surgical flap was then carefully reflected. The teeth were then subluxated with a series of straight elevators. Appropriate amounts of buccal and interseptal bone were then removed utilizing a surgical handpiece and bur and copious amounts of sterile water.  The teeth were then again subluxated with a series of straight elevators. The coronal aspect of tooth numbers 14, 15, 16 were then removed with a 150 forceps leaving the roots remaining. The roots were then removed appropriately with a rongeur and series of cryers elevators. Please note: There was no evidence of purulent exudate that could be expressed from the extraction sites at this time. The left facial swelling was significantly boardlike and nonfluctuant. Alveoloplasty was then performed utilizing a ronguers and bone file. The surgical site was then irrigated with copious amounts of sterile saline  4. The tissues were approximated and trimmed appropriately. A piece of Surgifoam was placed in the extraction sockets appropriately. The surgical site was then closed from  the maxillary left tuberosity and extended to the mesial of #14 utilizing 3-0 chromic gut suture in a continuous interrupted suture technique 1.  At this point time, the entire mouth was irrigated with copious amounts of sterile saline. The patient was examined for complications, seeing none, the dental medicine procedure was deemed to be complete. The throat pack was removed at this time. A series of 4 x 4 gauze were placed in the mouth to aid hemostasis. The patient was then handed over to the anesthesia team for final disposition. After an appropriate amount of time, the patient was extubated and taken to the postanesthsia care unit in good condition. All counts were correct for the dental medicine procedure.  Patient may be discharged at the discretion of the hospitalist team once medically stable and discharged with oral antibiotic therapy as indicated.   Charlynne Panderonald F. Kulinski, DDS.

## 2015-01-17 NOTE — Anesthesia Procedure Notes (Signed)
Procedure Name: Intubation Date/Time: 01/17/2015 7:58 AM Performed by: Daiva EvesAVENEL, Chriss Mannan W Pre-anesthesia Checklist: Emergency Drugs available, Patient identified, Suction available, Patient being monitored and Timeout performed Patient Re-evaluated:Patient Re-evaluated prior to inductionOxygen Delivery Method: Circle system utilized Preoxygenation: Pre-oxygenation with 100% oxygen Intubation Type: IV induction Ventilation: Mask ventilation without difficulty Laryngoscope Size: Mac and 4 Grade View: Grade I Tube type: Oral Tube size: 7.5 mm Number of attempts: 1 Placement Confirmation: ETT inserted through vocal cords under direct vision,  CO2 detector,  breath sounds checked- equal and bilateral and positive ETCO2 Secured at: 23 cm Tube secured with: Tape Dental Injury: Teeth and Oropharynx as per pre-operative assessment

## 2015-01-17 NOTE — Progress Notes (Signed)
[facility] PROGRESS NOTE  Mark Roberson ZOX:[id number] DOB: 08-Oct-1945 DOA: 01/11/2015 PCP: [name], MD  Brief Summary  The patient is a 69 year old male with history of morbid obesity, chronic diastolic heart failure, atrial fibrillation, diabetes mellitus type 2 who presented with generalized weakness, increased lower extremity edema, shortness of breath, 30 pound weight gain.   He admitted that he was noncompliant with his diet over the last couple weeks because he went on vacation in [place]. He states he was compliant with his medications, however, he responded briskly to IV lasix and has been transitioned to oral torsemide.   He developed copious epistaxis over the weekend and ENT performed cautery on Monday. He will need to be off his anticoagulant for at least 2 weeks. He also developed in area of swelling on his left face but did not include abscess but was related to multiple apical tooth abscesses. He was started on clindamycin and underwent tooth extraction on 4/26. The swelling in his left cheek has become slightly fluctuant on exam on 4/26 and so I have ordered a repeat ultrasound of his left cheek to see if there is a drainable collection.   Advancing his diet as tolerated. If no drainable collection, patient will be stable for discharge. He will need a long course of antibiotics for his tooth abscesses and left face cellulitis.  Assessment/Plan  Acute hypoxic respiratory failure secondary to acute on chronic diastolic heart failure, creatinine trending up slightly -   Recent echocardiogram demonstrated ejection fraction of 60-65% with mild MR, moderately dilated left atrium, moderately dilated RV , peak PA pressure 50 mmHg -   Diuresed 1 L yesterday -  Weight stable at 332-lbs -   D/c Lasix 40 mg and start torsemide  po BID  Epistaxis, has been going on every other day at home and had episode of "gushing" from both nares that lasted about 5 hours overnight  from 4/22-4/23 and his hemoglobin trended down to 8. -  Patient declined cold compresses, pressure, or packing by nursing staff -  Continue afrin and start nasal saline and remove O2 if possible -  Advised patient to avoid blowing his nose -  Xarelto held -  Appreciate ENT assistance:  Silver nitrate cautery performed on bilateral anterior septum  Multiple periapical tooth abscesses with extensive surrounding edema at least 3-4cm in diameter with no clear abscess formation -  CT face on 4/23 >> apical abscesses with significant soft tissue swelling -  Continue clindamycin and peridex -  Continue florastor -  Orthopantogram:  Multiple abscesses -  Appreciate Dentistry assistance:  Dental extraction performed on 4/26 -  Advance to soft diet as tolerated  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 Explained untreated osa can worsen heart failure issues.  HTN with hypotension -  Place hold parameter for carvedilol and decreased dose to 3.125mg   Atrial flutter, junctional bradycardia - continue carvedilol -  Hold xarelto due to epistaxis  DM (diabetes mellitus)- A1c 6.6 11/14/2014, CBG at goal -  Increase lantus to 55 units -  Continue SSI to high dose with qhs insulin -  Continue aspart 3 units with meals  Skin tear and stage 2 pressure ulcer, present at time of admission -  Appreciate wound care assistance -  Continue  hydrocolloid dressing  Acute blood loss anemia superimposed on iron deficiency anemia -  Hemoglobin at baseline of 9.5mg /dl -  Iron studies c/w iron deficiency (possibly due to frequent nose bleeds), b12 455, folate 15, TSH 5.653 -  Start iron supplementation with bowel regimen  Hyperkalemia, likely spurious from mild hemolysis.   Patient diuresed 5-1/2 L and was not given any potassium repletion.  resolved  Sick euthyroid.  FT3/fT4 wnl.  TSH mildly elevated -  Repeat TFTs in 3-4 weeks as outpatient  Mild leukocytosis likely related to his apical abscess  Flat affect.  ddx includes depression, parkinson's disease -  No tremor to suggest parkinson's  Area of abrasion and cellulitis right abdominal intertrigenous region -  abx as above  Diet:  Diabetic, low sodium Access:  PIV IVF:  off Proph:  SCDs  Code Status: full Family Communication: patient alone Disposition Plan:  Awaiting US left face to eval for drainable collection.  [facility]  Consultants:  none  Procedures:  CXR  CT maxillofacial  Antibiotics:  none   HPI/Subjective:  Face pain decreased, face swelling down, no longer red.  Extremities still a little swollen but much better.  Denies further nose bleeds.    Objective: Filed Vitals:   01/17/15 0900 01/17/15 0915 01/17/15 0921 01/17/15 0940  BP: 145/60 130/54  134/46  Pulse: 61 60  67  Temp:   97.2 F (36.2 C) 98.4 F (36.9 C)  TempSrc:    Oral  Resp: 18 21  20   Height:      Weight:      SpO2: 99% 100%  97%    Intake/Output Summary (Last 24 hours) at 01/17/15 1412 Last data filed at 01/17/15 1406  Gross per 24 hour  Intake   1843 ml  Output   2725 ml  Net   -882 ml   Filed Weights   01/15/15 0501 01/16/15 0516 01/17/15 0550  Weight: 150.776 kg (332 lb 6.4 oz) 150.73 kg (332 lb 4.8 oz) 147.646 kg (325 lb 8 oz)    Exam:   General:   Obese male, No acute distress  HEENT:  NCAT, MMM.  Persistent swelling of left cheek, 3cm area of possible fluctuance, no overlying erythema.  Poor dentition, multiple teeth extracted in upper left jaw  Cardiovascular:  RRR, nl S1, S2 no mrg, 2+ pulses, warm extremities  Respiratory:  CTAB, no increased WOB  Abdomen:   NABS, soft, NT/ND  MSK:   Normal tone and bulk, 1 + soft pitting LEE of bilateral lower  extremities  Neuro:  Grossly intact  Data Reviewed: Basic Metabolic Panel:  Recent Labs Lab 01/13/15 0535 01/14/15 0513 01/15/15 0657 01/16/15 0539 01/17/15 0611  NA 139 139 137 136 138  K 4.3 4.4 4.5 4.3 4.1  CL 100 100 98 95* 96  CO2 30 28 29 30 30   GLUCOSE 140* 184* 151* 146* 106*  BUN 52* 41* 35* 29* 32*  CREATININE 1.42* 1.35 1.38* 1.50* 1.63*  CALCIUM 8.8 9.0 8.7 8.8 8.7   Liver Function Tests: No results for input(s): AST, ALT, ALKPHOS, BILITOT, PROT, ALBUMIN in the last 168 hours. No results for input(s): LIPASE, AMYLASE in the last 168 hours. No results for input(s): AMMONIA in the last 168 hours. CBC:  Recent Labs Lab 01/11/15 1942 01/11/15 1952 01/14/15 0513 01/15/15 0657 01/16/15 0539 01/17/15 0611  WBC 9.9  --  12.1* 10.3 11.0* 9.0  NEUTROABS 6.7  --   --   --   --   --  HGB 8.8* 9.5* 9.2* 8.0* 7.9* 7.7*  HCT 27.6* 28.0* 29.3* 25.2* 24.9* 23.9*  MCV 90.2  --  91.0 91.6 90.2 89.8  PLT 185  --  221 177 182 165   Cardiac Enzymes: No results for input(s): CKTOTAL, CKMB, CKMBINDEX, TROPONINI in the last 168 hours. BNP (last 3 results)  Recent Labs  11/08/14 1236 01/11/15 1942  BNP 300.9* 248.0*    ProBNP (last 3 results) No results for input(s): PROBNP in the last 8760 hours.  CBG:  Recent Labs Lab 01/16/15 2050 01/17/15 0539 01/17/15 0714 01/17/15 0843 01/17/15 1148  GLUCAP 152* 103* 106* 118* 132*    Recent Results (from the past 240 hour(s))  Surgical pcr screen     Status: Abnormal   Collection Time: 01/16/15  4:26 PM  Result Value Ref Range Status   MRSA, PCR POSITIVE (A) NEGATIVE Final   Staphylococcus aureus POSITIVE (A) NEGATIVE Final    Comment:        The Xpert SA Assay (FDA approved for NASAL specimens in patients over 32 years of age), is one component of a comprehensive surveillance program.  Test performance has been validated by [facility] for patients greater than or equal to 81 year old. It is not  intended to diagnose infection nor to guide or monitor treatment.      Studies: Dg Orthopantogram  01/16/2015   CLINICAL DATA:  Left facial swelling.  EXAM: ORTHOPANTOGRAM/PANORAMIC  COMPARISON:  Face CT from 2 days ago  FINDINGS: Large cavities in the left upper molars with periapical erosions.  The mandible is intact and located. No mandibular erosion. Gross symmetric aeration of the bilateral maxillary sinuses.  IMPRESSION: Large cavities in the left upper molars with periapical erosions.   Electronically Signed   By: Marnee Spring M.D.   On: 01/16/2015 12:38    Scheduled Meds: . aspirin EC  81 mg Oral Daily  . carvedilol  3.125 mg Oral BID WC  . chlorhexidine  15 mL Mouth/Throat QID  . Chlorhexidine Gluconate Cloth  6 each Topical Q0600  . clindamycin (CLEOCIN) IV  600 mg Intravenous 3 times per day  . docusate sodium  100 mg Oral BID  . ferrous sulfate  325 mg Oral BID WC  . insulin aspart  0-20 Units Subcutaneous TID WC  . insulin aspart  0-5 Units Subcutaneous QHS  . insulin aspart  3 Units Subcutaneous TID WC  . insulin glargine  55 Units Subcutaneous QHS  . lidocaine   Topical Once  . mupirocin ointment  1 application Nasal BID  . oxymetazoline  1 spray Each Nare BID  . saccharomyces boulardii  250 mg Oral BID  . sodium chloride  3 mL Intravenous Q12H  . torsemide  40 mg Oral BID   Continuous Infusions: . lactated ringers 10 mL/hr at 01/17/15 1014    Principal Problem:   Acute on chronic diastolic CHF (congestive heart failure) Active Problems:   Morbid obesity   HTN (hypertension)   Atrial flutter   DM (diabetes mellitus), type 2 with renal complications   AKI (acute kidney injury)   Peripheral edema   Acute on chronic diastolic CHF (congestive heart failure), NYHA class 1   Epistaxis   Anemia, iron deficiency   Apical abscess    Time spent: 30 min    Elroy Schembri, [facility]  [facility] Pager [phone]. If 7PM-7AM, please contact night-coverage at  [email or web address], password [facility] 01/17/2015, 2:12 PM  LOS: 4 days

## 2015-01-17 NOTE — Discharge Instructions (Signed)

## 2015-01-17 NOTE — Anesthesia Postprocedure Evaluation (Signed)
  Anesthesia Post-op Note  Patient: Mark GoldsMichael Roberson  Procedure(s) Performed: Procedure(s): Extraction of tooth #'s 14,15, 16 with alveolopalsty (N/A)  Patient Location: PACU  Anesthesia Type:General  Level of Consciousness: awake and alert   Airway and Oxygen Therapy: Patient Spontanous Breathing  Post-op Pain: mild  Post-op Assessment: Post-op Vital signs reviewed  Post-op Vital Signs: stable  Last Vitals:  Filed Vitals:   01/17/15 0900  BP: 145/60  Pulse: 61  Temp:   Resp: 18    Complications: No apparent anesthesia complications

## 2015-01-17 NOTE — Transfer of Care (Signed)
Immediate Anesthesia Transfer of Care Note  Patient: Mark Roberson  Procedure(s) Performed: Procedure(s): Extraction of tooth #'s 14,15, 16 with alveolopalsty (N/A)  Patient Location: PACU  Anesthesia Type:General  Level of Consciousness: awake, alert  and oriented  Airway & Oxygen Therapy: Patient Spontanous Breathing and Patient connected to nasal cannula oxygen  Post-op Assessment: Report given to RN and Post -op Vital signs reviewed and stable  Post vital signs: Reviewed and stable  Last Vitals:  Filed Vitals:   01/17/15 0550  BP: 119/43  Pulse: 63  Temp: 36.8 C  Resp: 17    Complications: No apparent anesthesia complications

## 2015-01-17 NOTE — Anesthesia Preprocedure Evaluation (Addendum)
Anesthesia Evaluation  Patient identified by MRN, date of birth, ID band Patient awake    Airway Mallampati: II  TM Distance: >3 FB Neck ROM: Full    Dental  (+) Teeth Intact, Poor Dentition   Pulmonary asthma , sleep apnea ,    + decreased breath sounds      Cardiovascular hypertension, +CHF Rhythm:Regular Rate:Normal     Neuro/Psych    GI/Hepatic   Endo/Other  diabetesMorbid obesity  Renal/GU      Musculoskeletal negative musculoskeletal ROS (+)   Abdominal (+) + obese,   Peds  Hematology Recent nose bleeds   Anesthesia Other Findings   Reproductive/Obstetrics                            Anesthesia Physical Anesthesia Plan  ASA: IV  Anesthesia Plan: MAC and General   Post-op Pain Management:    Induction: Intravenous  Airway Management Planned:   Additional Equipment:   Intra-op Plan:   Post-operative Plan: Possible Post-op intubation/ventilation  Informed Consent: I have reviewed the patients History and Physical, chart, labs and discussed the procedure including the risks, benefits and alternatives for the proposed anesthesia with the patient or authorized representative who has indicated his/her understanding and acceptance.   Dental advisory given  Plan Discussed with: CRNA and Surgeon  Anesthesia Plan Comments:         Anesthesia Quick Evaluation

## 2015-01-17 NOTE — Progress Notes (Signed)
SATURATION QUALIFICATIONS: (This note is used to comply with regulatory documentation for home oxygen)  Patient Saturations on Room Air at Rest = 87%      

## 2015-01-17 NOTE — Progress Notes (Signed)
PRE-OPERATIVE NOTE:  01/17/2015   Mark Roberson [id number]  VITALS: BP 119/43 mmHg  Pulse 63  Temp(Src) 98.3 F (36.8 C) (Oral)  Resp 17  Ht 6' (1.829 m)  Wt 325 lb 8 oz (147.646 kg)  BMI 44.14 kg/m2  SpO2 93%  Lab Results  Component Value Date   WBC 9.0 01/17/2015   HGB 7.7* 01/17/2015   HCT 23.9* 01/17/2015   MCV 89.8 01/17/2015   PLT 165 01/17/2015   BMET    Component Value Date/Time   NA 136 01/16/2015 0539   K 4.3 01/16/2015 0539   CL 95* 01/16/2015 0539   CO2 30 01/16/2015 0539   GLUCOSE 146* 01/16/2015 0539   BUN 29* 01/16/2015 0539   CREATININE 1.50* 01/16/2015 0539   CALCIUM 8.8 01/16/2015 0539   GFRNONAA 46* 01/16/2015 0539   GFRAA 53* 01/16/2015 0539    No results found for: INR, PROTIME No results found for: PTT   Mark Roberson presents for multiple dental extractions with alveoloplasty in the operating room with general anesthesia.   SUBJECTIVE: The patient denies any acute medical or dental changes and agrees to proceed with treatment as planned.  EXAM: No sign of acute dental changes. Left facial swelling has improved.  ASSESSMENT: Patient is affected by left facial swelling, periapical abscess, apical periodontitis, and dental caries.  PLAN: Patient agrees to proceed with treatment as planned in the operating room as previously discussed and accepts the risks, benefits, and complications of the proposed treatment. Patient is aware of the risk for bleeding, bruising, swelling, infection, pain, nerve damage, soft tissue damage, damage to adjacent teeth,  sinus involvement, root tip fracture, mandible fracture, and the risks of complications associated with the anesthesia. Patient is aware of the potential complication up to and including death due to his overall cardiac and respiratory compromise. Patient also is aware of the potential for other complications not mentioned above.   Charlynne Panderonald F. Kulinski, DDS

## 2015-01-18 ENCOUNTER — Encounter (HOSPITAL_COMMUNITY): Payer: Self-pay | Admitting: Dentistry

## 2015-01-18 DIAGNOSIS — D509 Iron deficiency anemia, unspecified: Secondary | ICD-10-CM

## 2015-01-18 LAB — CBC
HCT: 23.7 % — ABNORMAL LOW (ref 39.0–52.0)
HEMOGLOBIN: 7.5 g/dL — AB (ref 13.0–17.0)
MCH: 28.4 pg (ref 26.0–34.0)
MCHC: 31.6 g/dL (ref 30.0–36.0)
MCV: 89.8 fL (ref 78.0–100.0)
PLATELETS: 190 10*3/uL (ref 150–400)
RBC: 2.64 MIL/uL — ABNORMAL LOW (ref 4.22–5.81)
RDW: 15.2 % (ref 11.5–15.5)
WBC: 9.9 10*3/uL (ref 4.0–10.5)

## 2015-01-18 LAB — BASIC METABOLIC PANEL
ANION GAP: 10 (ref 5–15)
BUN: 38 mg/dL — ABNORMAL HIGH (ref 6–23)
CALCIUM: 8.4 mg/dL (ref 8.4–10.5)
CHLORIDE: 93 mmol/L — AB (ref 96–112)
CO2: 29 mmol/L (ref 19–32)
Creatinine, Ser: 1.87 mg/dL — ABNORMAL HIGH (ref 0.50–1.35)
GFR calc Af Amer: 41 mL/min — ABNORMAL LOW (ref >90)
GFR calc non Af Amer: 35 mL/min — ABNORMAL LOW (ref >90)
GLUCOSE: 229 mg/dL — AB (ref 70–99)
POTASSIUM: 4.7 mmol/L (ref 3.5–5.1)
SODIUM: 132 mmol/L — AB (ref 135–145)

## 2015-01-18 LAB — GLUCOSE, CAPILLARY
GLUCOSE-CAPILLARY: 209 mg/dL — AB (ref 70–99)
Glucose-Capillary: 151 mg/dL — ABNORMAL HIGH (ref 70–99)
Glucose-Capillary: 213 mg/dL — ABNORMAL HIGH (ref 70–99)

## 2015-01-18 MED ORDER — SODIUM CHLORIDE 0.9 % IR SOLN: 200.0000 mL | Status: DC

## 2015-01-18 MED ORDER — SACCHAROMYCES BOULARDII 250 MG PO CAPS: 250.0000 mg | ORAL_CAPSULE | Freq: Two times a day (BID) | ORAL | Status: DC

## 2015-01-18 MED ORDER — CHLORHEXIDINE GLUCONATE 0.12 % MT SOLN: 15.0000 mL | Freq: Two times a day (BID) | OROMUCOSAL | Status: DC

## 2015-01-18 MED ORDER — TORSEMIDE 20 MG PO TABS: 40.0000 mg | ORAL_TABLET | Freq: Every day | ORAL | Status: DC

## 2015-01-18 MED ORDER — CARVEDILOL 6.25 MG PO TABS: 3.1250 mg | ORAL_TABLET | Freq: Two times a day (BID) | ORAL | Status: DC

## 2015-01-18 MED ORDER — FERROUS SULFATE 325 (65 FE) MG PO TABS: 325.0000 mg | ORAL_TABLET | Freq: Two times a day (BID) | ORAL | Status: DC

## 2015-01-18 MED ORDER — CHLORHEXIDINE GLUCONATE 0.12 % MT SOLN
15.0000 mL | Freq: Two times a day (BID) | OROMUCOSAL | Status: DC
  Administered 2015-01-18: 15 mL via OROMUCOSAL
  Filled 2015-01-18 (×2): qty 15

## 2015-01-18 NOTE — Progress Notes (Signed)
POST OPERATIVE NOTE: Post op day #1  01/18/2015    Mark GoldsMichael Roberson [id number]  VITALS: BP 100/49 mmHg  Pulse 51  Temp(Src) 97.8 F (36.6 C) (Oral)  Resp 18  Ht 6' (1.829 m)  Wt 326 lb 1.6 oz (147.918 kg)  BMI 44.22 kg/m2  SpO2 100%  LABS:  Lab Results  Component Value Date   WBC 9.9 01/18/2015   HGB 7.5* 01/18/2015   HCT 23.7* 01/18/2015   MCV 89.8 01/18/2015   PLT 190 01/18/2015   BMET    Component Value Date/Time   NA 132* 01/18/2015 0434   K 4.7 01/18/2015 0434   CL 93* 01/18/2015 0434   CO2 29 01/18/2015 0434   GLUCOSE 229* 01/18/2015 0434   BUN 38* 01/18/2015 0434   CREATININE 1.87* 01/18/2015 0434   CALCIUM 8.4 01/18/2015 0434   GFRNONAA 35* 01/18/2015 0434   GFRAA 41* 01/18/2015 0434    No results found for: INR, PROTIME No results found for: PTT   Mark GoldsMichael Roberson is status post extraction of tooth numbers 14, 15, 16 with alveoloplasty in the operating room with general anesthesia.  SUBJECTIVE: Patient with minimal oral discomfort. Patient denies active bleeding.   EXAM: There is no sign of infection, heme, or ooze. Sutures are intact. Clots are present. Left facial swelling is resolving well. Minimal ecchymosis and bruising.   ASSESSMENT: Post operative course is consistent with dental procedures performed in the operating room.   PLAN: 1. Chlorhexidine rinses twice a day after breakfast and at bedtime. 2. Normal saline rinses every 2 hours while awake in between the chlorhexidine rinses. 3. Advance diet as tolerated. 4. Okay for discharge from dental standpoint. Suture removal in 7-10 days. 5. Suggest follow-up with general dentist of his choice for exam, radiographs, and evaluation for other dental treatment needs.   Charlynne Panderonald F. Sima Lindenberger, DDS

## 2015-01-18 NOTE — Progress Notes (Signed)
Instructed by Dr Roda ShuttersXU for Mark Roberson, case manager to order sleep study for pt.  Mark Roberson notified and instructed she is working on it.  [name],RN.

## 2015-01-18 NOTE — Progress Notes (Signed)
Patient had no complaints of pain or discomfort last night. Patient is concerned about needing O2 for home use. Refused Lactated Ringer's IV and Iron this am.

## 2015-01-18 NOTE — Progress Notes (Signed)
Called Mark FanningJulie and asked if sleep study is set up and if ok for pt to go.  Verbalized that sleep study is set up and pt can be d/c'd to home.

## 2015-01-18 NOTE — Care Management Note (Signed)
    Page 1 of 2   01/18/2015     4:44:34 PM CARE MANAGEMENT NOTE 01/18/2015  Patient:  Mark Roberson,Mark Roberson   Account Number:  [id number]  Date Initiated:  01/16/2015  Documentation initiated by:  [facility]  Subjective/Objective Assessment:   CHF, tooth extractions     Action/Plan:   Anticipated DC Date:  01/18/2015   Anticipated DC Plan:  HOME W HOME HEALTH SERVICES      DC Planning Services  CM consult      [facility]  Resumption Of Svcs/PTA Provider  HOME HEALTH   Choice offered to / List presented to:  C-1 Patient   DME arranged  [facility]      DME agency  [facility]     [facility]  [facility]  [facility] PT      [facility]   Status of service:  Completed, signed off Medicare Important Message given?  YES (If response is "NO", the following Medicare IM given date fields will be blank) Date Medicare IM given:  01/16/2015 Medicare IM given by:  [facility] Date Additional Medicare IM given:   Additional Medicare IM given by:    Discharge Disposition:  HOME W HOME HEALTH SERVICES  Per UR Regulation:  Reviewed for med. necessity/level of care/duration of stay  If discussed at Long Length of Stay Meetings, dates discussed:   01/17/2015    Comments:  01/18/15 Sidney AceJulie Anise Harbin, RN, BSN [phone] Pt for dc home today.  He qualifies for home oxygen; [facility].  [facility]; [facility].  Start of care 24-48h post dc date.  Sleep study scheduled for 5/13 at 8pm; appt information on AVS.  Sleep center to mail packet to pt.  01/16/2015 1600 NCM spoke to pt and [facility] is [facility] with [facility]. Notified [facility] for HH. Pt has RW at home. [facility] of [facility] Isidoro DonningAlesia Shavis RN CCM Case Mgmt phone [phone]

## 2015-01-18 NOTE — Progress Notes (Signed)
ED CM called by Denton Ar RN on 3E concerning  patient's caregiver refusing to take discharged patient home, she states, she is concerned about patient being positive for MRSA and on contact isolation. ED CM met with patient and caregiver at bedside. Care giver voiced concerned about the [facility] services that patient will need. Explained to patient and care giver we could request for OT an HHA as well. Discussed that patient was medically cleare for discharge, continued stay would not be covered by Medicare, so patient will be billed Patient and care taker agreed to be leave tonight .  Referral placed [facility] for OT/HHA services. Provide patient with a list of Private duty Care. No further CM needs identified.

## 2015-01-18 NOTE — Progress Notes (Signed)
Pt's 02 88% RA at rest.  Pt refused to complete o2 check sitting and standing.  Dr. Roda ShuttersXu made aware.  In[facility]  Julie with case management made aware also..  Will continue to monitor.  Mark Roberson, Charity fundraiserN.

## 2015-01-18 NOTE — Progress Notes (Signed)
Pt's caregiver arrived this evening to take pt home. Pt caregiver was unaware that pt was still on contact precautions for positive MRSA, and voiced concern about this due to also having a 69 year old daughter at home. After becoming aware of this, pt's caregiver also voiced concern that she will need to travel out of state tomorrow morning for a very recent death of a family member. Pt and caregiver are unaware of the current plan (namely frequency) for home health RN, apart from knowing that they will receive the first visit tomorrow around 2pm. Pt cannot adequately give himself medical care. Social worker was called, and they spoke with case management. Current options are for discharge tonight (due to D/C order being written at 2pm) or in the AM (with pt most likely being charged for the remaining hospital stay "out of pocket"). Case management has now called back again and will come to speak with pt and caregiver on the floor.

## 2015-01-18 NOTE — Discharge Summary (Addendum)
Discharge Summary  Mark Roberson ZOX:[id number] DOB: December 05, 1945  PCP: [name], MD  Admit date: 01/11/2015 Discharge date: 01/18/2015  Time spent: >93mins  Recommendations for Outpatient Follow-up:  1. F/u with PMD with in a week, pmd to repeat cbc/bmp. pmd to restart xarelto if renal function stable and no recurrent nose bleed. pmd to follow up on sleep study result. 2. F/u with dentist for suture removal  Discharge Diagnoses:  Active Hospital Problems   Diagnosis Date Noted  . Acute on chronic diastolic CHF (congestive heart failure) 01/11/2015  . Apical abscess 01/15/2015  . Epistaxis 01/14/2015  . Anemia, iron deficiency 01/14/2015  . Acute on chronic diastolic CHF (congestive heart failure), NYHA class 1 01/13/2015  . AKI (acute kidney injury) 01/11/2015  . Peripheral edema 01/11/2015  . Morbid obesity 11/08/2014  . HTN (hypertension) 11/08/2014  . Atrial flutter 11/08/2014  . DM (diabetes mellitus), type 2 with renal complications 11/08/2014    Resolved Hospital Problems   Diagnosis Date Noted Date Resolved  No resolved problems to display.    Discharge Condition: stable  Diet recommendation: heart healthy/carb modified  Filed Weights   01/16/15 0516 01/17/15 0550 01/18/15 0510  Weight: 150.73 kg (332 lb 4.8 oz) 147.646 kg (325 lb 8 oz) 147.918 kg (326 lb 1.6 oz)    History of present illness:  69 yo male h/o MO, diastolic chf, afib, dm comes in with generalized weakness and increased le edema for several days. Pt reports he normally swells then he goes into heart failure. No sob, but figured he "get on top of it" so came to ED. No fevers. No chest pain. Does not require oxygen supplementation at home chronically. He was set up for sleep study in feb but did not do it because he feels he does not need a sleep study. Reports 30lb wt gain in the last week. Taking medications as told by his doctors. Referred for admission for mild chf  exacerbation.  Hospital Course:  Principal Problem:   Acute on chronic diastolic CHF (congestive heart failure) Active Problems:   Morbid obesity   HTN (hypertension)   Atrial flutter   DM (diabetes mellitus), type 2 with renal complications   AKI (acute kidney injury)   Peripheral edema   Acute on chronic diastolic CHF (congestive heart failure), NYHA class 1   Epistaxis   Anemia, iron deficiency   Apical abscess  Acute hypoxic respiratory failure secondary to acute on chronic diastolic heart failure, creatinine trending up slightly - Recent echocardiogram demonstrated ejection fraction of 60-65% with mild MR, moderately dilated left atrium, moderately dilated RV , peak PA pressure 50 mmHg - Diuresed well in the hospital, at time of discharge, close to euvolemic with minimal trace pitting lower extremity edema - Weight stable at 332-lbs - he is discharged on increase dose of torsemide 40mg  po qd, pmd to continue monitor cr level and edema, further adjust ment of diuretics per pmd. -patient is discharged on home oxygen as well.  Epistaxis, has been going on every other day at home and had episode of "gushing" from both nares that lasted about 5 hours overnight from 4/22-4/23 and his hemoglobin trended down to 8. - Patient declined cold compresses, pressure, or packing by nursing staff - received afrin and nasal saline  - Advised patient to avoid blowing his nose - Appreciate ENT assistance: Silver nitrate cautery performed on bilateral anterior septum - Xarelto held , repeat cbc and bmp within a week by pmd, if hgb  and renal function stable, no recurrent nose bleed, may resume xarelto in a week.   Multiple periapical tooth abscesses with extensive surrounding edema at least 3-4cm in diameter with no clear abscess formation - CT face on 4/23 >> apical abscesses with significant soft tissue swelling - received clindamycin and peridex - Continue florastor -  Orthopantogram: Multiple abscesses - Dentistry assistance: Dental extraction performed on 4/26 - Advance to soft diet as tolerated, he is discharged with peridex and dentist follow up for suture removal  Acute on chronic kidney disease stage 3, likely cardiorenal syndrome, creatinine trended down with diuresis, BUN continuing to trend down but creatine up slightly - Minimize nephrotoxins and renally dose medications -discharged with demodex 40mg  po qd, continue to hold lisinopril, pmd to repeat bmp in one week  Morbid obesity- He needs sleep study and advised this. Explained untreated osa can worsen heart failure issues.  HTN with hypotension - Place hold parameter for carvedilol and decreased dose to 3.125mg  -pmd to continue monitor blood pressure, he is discharged on decreased dose of coreg,  -lisinopril discontinued due to borderline bp and cr.  Atrial flutter, junctional bradycardia - continue low dose carvedilol - Hold xarelto due to epistaxis, pmd to resume xarelto in one week if no  Recurrent epistaxis, hgb/cr stable.  DM (diabetes mellitus)- A1c 6.6 11/14/2014, CBG at goal - a1c 6.6, home dose insulin resumed at discharge, pmd to continue monitor blood sugar control  Skin tear and stage 2 pressure ulcer, present at time of admission - Appreciate wound care assistance - received hydrocolloid dressing  Acute blood loss anemia superimposed on iron deficiency anemia - Hemoglobin at baseline of 9.5mg /dl - Iron studies c/w iron deficiency (possibly due to frequent nose bleeds), b12 455, folate 15, TSH 5.653 - Start iron supplementation with bowel regimen, patient refused iron supplement, refused blood transfusion  Hyperkalemia, likely spurious from mild hemolysis. Patient diuresed 5-1/2 L and was not given any potassium repletion. resolved  Sick euthyroid. FT3/fT4 wnl. TSH mildly elevated - Repeat TFTs in 3-4 weeks as outpatient  Mild leukocytosis likely  related to his apical abscess, resolved.  Flat affect. ddx includes depression, parkinson's disease - No tremor to suggest parkinson's -declined psychiatry consult, denies SI/HI.  Area of abrasion and cellulitis right abdominal intertrigenous region - abx as above  Noncompliance: patient does not actively participate care, refused blood transfusion, refused iron supplement, not receptive in weight reduction education, declined ambulation. Requested being discharged home to friends.  Diet: Diabetic, low sodium  Code Status: full Family Communication: patient alone [facility]  [facility], home [facility], care manager to assist outpatient sleep study arrangement.  Consultants:  Dentist  ENT  Procedures:  CXR  CT maxillofacial  Antibiotics:  clindamycin  Discharge Exam: BP 100/49 mmHg  Pulse 51  Temp(Src) 97.8 F (36.6 C) (Oral)  Resp 18  Ht 6' (1.829 m)  Wt 147.918 kg (326 lb 1.6 oz)  BMI 44.22 kg/m2  SpO2 100%   General: Obese male, No acute distress  HEENT: NCAT, MMM. resolving edema of left cheek, no overlying erythema. Poor dentition, multiple teeth extracted in upper left jaw  Cardiovascular: RRR, nl S1, S2 no mrg, 2+ pulses, warm extremities  Respiratory: CTAB, no increased WOB  Abdomen: NABS, soft, NT/ND  MSK: Normal tone and bulk, 1 + soft pitting LEE of bilateral lower extremities  Neuro: Grossly intact   Discharge Instructions You were cared for by a hospitalist during your hospital stay. If you have  any questions about your discharge medications or the care you received while you were in the hospital after you are discharged, you can call the unit and asked to speak with the hospitalist on call if the hospitalist that took care of you is not available. Once you are discharged, your primary care physician will handle any further medical issues. Please note that NO REFILLS for any discharge medications will be  authorized once you are discharged, as it is imperative that you return to your primary care physician (or establish a relationship with a primary care physician if you do not have one) for your aftercare needs so that they can reassess your need for medications and monitor your lab values.      Discharge Instructions    Diet - low sodium heart healthy    Complete by:  As directed      Face-to-face encounter (required for Medicare/Medicaid patients)    Complete by:  As directed   I Kynzley Dowson certify that this patient is under my care and that I, or a nurse practitioner or physician's assistant working with me, had a face-to-face encounter that meets the physician face-to-face encounter requirements with this patient on 01/18/2015. The encounter with the patient was in whole, or in part for the following medical condition(s) which is the primary reason for home health care (List medical condition): FTT, hypoxia respiratory failure, noncompliant  The encounter with the patient was in whole, or in part, for the following medical condition, which is the primary reason for home health care:  hypoxia respiratory failure  I certify that, based on my findings, the following services are medically necessary home health services:   Nursing Physical therapy    Reason for Medically Necessary Home Health Services:  Skilled Nursing- Change/Decline in Patient Status  My clinical findings support the need for the above services:  Shortness of breath with activity  Further, I certify that my clinical findings support that this patient is homebound due to:  Shortness of Breath with activity     For home use only DME oxygen    Complete by:  As directed   Mode or (Route):  Nasal cannula  Liters per Minute:  2  Frequency:  Continuous (stationary and portable oxygen unit needed)  Oxygen delivery system:  Gas     Home Health    Complete by:  As directed   To provide the following care/treatments:   PT RN        Increase activity slowly    Complete by:  As directed             Medication List    STOP taking these medications        lisinopril 10 MG tablet  Commonly known as:  PRINIVIL,ZESTRIL     silver sulfADIAZINE 1 % cream  Commonly known as:  SILVADENE     XARELTO 20 MG Tabs tablet  Generic drug:  rivaroxaban      TAKE these medications        albuterol 108 (90 BASE) MCG/ACT inhaler  Commonly known as:  PROVENTIL HFA;VENTOLIN HFA  Inhale 2 puffs into the lungs every 6 (six) hours as needed for shortness of breath (shortness of breath).     albuterol (2.5 MG/3ML) 0.083% nebulizer solution  Commonly known as:  PROVENTIL  Take 3 mLs (2.5 mg total) by nebulization every 6 (six) hours as needed for wheezing or shortness of breath.     atorvastatin 20 MG tablet  Commonly known as:  LIPITOR  Take 20 mg by mouth at bedtime.     carvedilol 6.25 MG tablet  Commonly known as:  COREG  Take 0.5 tablets (3.125 mg total) by mouth 2 (two) times daily with a meal.     chlorhexidine 0.12 % solution  Commonly known as:  PERIDEX  Use as directed 15 mLs in the mouth or throat 2 (two) times daily.     cholecalciferol 1000 UNITS tablet  Commonly known as:  VITAMIN D  Take 1,000 Units by mouth at bedtime.     ferrous sulfate 325 (65 FE) MG tablet  Take 1 tablet (325 mg total) by mouth 2 (two) times daily with a meal.     fluticasone 50 MCG/ACT nasal spray  Commonly known as:  FLONASE  Place 2 sprays into both nostrils daily.     gabapentin 300 MG capsule  Commonly known as:  NEURONTIN  Take 300 mg by mouth 3 (three) times daily.     HUMALOG KWIKPEN 100 UNIT/ML KiwkPen  Generic drug:  insulin lispro  Inject 25 Units into the skin 3 (three) times daily.     insulin glargine 100 UNIT/ML injection  Commonly known as:  LANTUS  Inject 66 Units into the skin at bedtime.     magnesium oxide 400 MG tablet  Commonly known as:  MAG-OX  Take 400 mg by mouth at bedtime.     omega-3 acid  ethyl esters 1 G capsule  Commonly known as:  LOVAZA  Take 2 g by mouth at bedtime.     saccharomyces boulardii 250 MG capsule  Commonly known as:  FLORASTOR  Take 1 capsule (250 mg total) by mouth 2 (two) times daily.     TEGADERM + PAD 3-1/2"X6" Misc  Apply to wound daily     torsemide 20 MG tablet  Commonly known as:  DEMADEX  Take 2 tablets (40 mg total) by mouth daily.     Wheelchair Cushion Misc  Use to cushion wheelchair       Allergies  Allergen Reactions  . Percocet [Oxycodone-Acetaminophen] Other (See Comments)    hallucination  . Penicillins Hives   Follow-up Information    Follow up with [facility].   Why:  Home Health RN   Contact information:   [address] [place] [address] [phone]       Follow up with Charlynne Pander, DDS On 01/30/2015.   Specialty:  Dentistry   Why:  For suture removal, For wound re-check   Contact information:   [address] [place] [address] [phone]       Follow up with [name], MD On 01/27/2015.   Specialty:  Internal Medicine   Why:  Friday @ 11 AM, pcp to repeat cbc/bmp in one week, monitor hgb and cr level.   Contact information:   [address] [place] [address] [phone]       Follow up with [facility] On 02/03/2015.   Why:  8:00 PM   Sleep center will mail packet of instructions to you    Contact information:   [address] [place] [address] [phone]       The results of significant diagnostics from this hospitalization (including imaging, microbiology, ancillary and laboratory) are listed below for reference.    Significant Diagnostic Studies: Dg Orthopantogram  01/16/2015   CLINICAL DATA:  Left facial swelling.  EXAM:  ORTHOPANTOGRAM/PANORAMIC  COMPARISON:  Face CT from 2 days ago  FINDINGS: Large cavities in the left upper molars with periapical erosions.  The mandible is intact and  located. No mandibular erosion. Gross symmetric aeration of the bilateral maxillary sinuses.  IMPRESSION: Large cavities in the left upper molars with periapical erosions.   Electronically Signed   By: Marnee Spring M.D.   On: 01/16/2015 12:38   US Soft Tissue Head/neck  01/17/2015   CLINICAL DATA:  Left cheek swelling.  Evaluate for abscess.  EXAM: ULTRASOUND OF HEAD/NECK SOFT TISSUES  TECHNIQUE: Ultrasound examination of the head and neck soft tissues was performed in the area of clinical concern.  COMPARISON:  Contrast-enhanced maxillofacial CT- 01/14/2015; panoramic radiograph - 01/16/2015  FINDINGS: There is a serpiginous mixed echogenic approximately 2.5 x 0.8 x 1.9 cm structure within the subcutaneous tissues of the left cheek which likely correlates with the patient's palpable area of concern. No definitive blood flow was demonstrated within this fluid collection, however thin internal echogenic septations are seen within this structure on the provided cinegraphic images.  IMPRESSION: Small serpiginous approximately 2.5 cm structure correlates with the patient's palpable area of concern within the left cheek - while incompletely characterized on the present examination and potentially representative of a complex fluid collection, this structure is favored to represent the loculated subcutaneous fat that was demonstrated on recently performed contrast-enhanced maxillofacial CT. Clinical correlation is advised.   Electronically Signed   By: Simonne Come M.D.   On: 01/17/2015 16:42   Ct Maxillofacial W/cm  01/15/2015   CLINICAL DATA:  Left sided facial swelling and erythema. Evaluate for dental abscess.  EXAM: CT MAXILLOFACIAL WITH CONTRAST  TECHNIQUE: Multidetector CT imaging of the maxillofacial structures was performed with intravenous contrast. Multiplanar CT image reconstructions were also generated. A small metallic BB was placed on the right temple in order to reliably differentiate right from  left.  CONTRAST:  OMNIPAQUE IOHEXOL 300 MG/ML IV.  COMPARISON:  None.  FINDINGS: Metallic beam hardening streak artifact from the patient's dental work obscures fine detail on many of the images.  Large dental caries involving the 1st, 2nd and 3rd molars in the left side of the maxilla (teeth # 14, 15 and 16), each associated with periapical lucency. Teeth 15 and 16 have amalgam which creates abundant streak artifact. Edema/induration is present in the subcutaneous fat overlying the maxilla, extending upward into the infraorbital region. The left masseter muscle and the left pterygoid muscles are symmetric with those on the right. The fat in the masseter space is preserved. I do not identify a discrete abscess. Reactive level 2 nodes on the left, the largest measuring approximately 1.4 x 0.9 cm. No nodal masses.  The 3rd molar in the right side of the maxilla (tooth # 1) is incompletely erupted. No intrinsic abnormalities involving the facial bones otherwise. Temporomandibular joints intact.  Mucosal thickening involving the maxillary sinuses, left greater than right. Remaining paranasal sinuses, bilateral mastoid air cells and bilateral middle ear cavities well aerated. Midline bony nasal septum.  Visualized portions of the brain are unremarkable. Extensive bilateral carotid siphon atherosclerosis.  IMPRESSION: 1. Large dental caries involving the 1st, 2nd and 3rd molars of the left side of the maxilla (teeth # 14, 15 and 16) each associated with periapical lucency consistent with periapical abscess. 2. Extensive edema/induration in the subcutaneous fat overlying the left side of the maxilla. A discrete soft tissue abscess is not identified. 3. Scattered reactive level 2 left cervical  lymph nodes. No nodal masses. 4. Mild chronic bilateral maxillary sinus disease, left greater than right.   Electronically Signed   By: Hulan Saas M.D.   On: 01/15/2015 07:26   Dg Chest Port 1 View  01/11/2015    CLINICAL DATA:  Shortness of Breath  EXAM: PORTABLE CHEST - 1 VIEW  COMPARISON:  11/08/2014  FINDINGS: Cardiomegaly in stable in appearance.  The right hemidiaphragm is elevated and stable. No focal infiltrate or sizable effusion is seen.  IMPRESSION: No acute abnormality noted.   Electronically Signed   By: Alcide Clever M.D.   On: 01/11/2015 20:39    Microbiology: Recent Results (from the past 240 hour(s))  Surgical pcr screen     Status: Abnormal   Collection Time: 01/16/15  4:26 PM  Result Value Ref Range Status   MRSA, PCR POSITIVE (A) NEGATIVE Final   Staphylococcus aureus POSITIVE (A) NEGATIVE Final    Comment:        The Xpert SA Assay (FDA approved for NASAL specimens in patients over 63 years of age), is one component of a comprehensive surveillance program.  Test performance has been validated by [facility] for patients greater than or equal to 46 year old. It is not intended to diagnose infection nor to guide or monitor treatment.      Labs: Basic Metabolic Panel:  Recent Labs Lab 01/14/15 0513 01/15/15 0657 01/16/15 0539 01/17/15 0611 01/18/15 0434  NA 139 137 136 138 132*  K 4.4 4.5 4.3 4.1 4.7  CL 100 98 95* 96 93*  CO2 GLUCOSE 184* 151* 146* 106* 229*  BUN 41* 35* 29* 32* 38*  CREATININE 1.35 1.38* 1.50* 1.63* 1.87*  CALCIUM 9.0 8.7 8.8 8.7 8.4   Liver Function Tests: No results for input(s): AST, ALT, ALKPHOS, BILITOT, PROT, ALBUMIN in the last 168 hours. No results for input(s): LIPASE, AMYLASE in the last 168 hours. No results for input(s): AMMONIA in the last 168 hours. CBC:  Recent Labs Lab 01/11/15 1942  01/14/15 0513 01/15/15 0657 01/16/15 0539 01/17/15 0611 01/18/15 0434  WBC 9.9  --  12.1* 10.3 11.0* 9.0 9.9  NEUTROABS 6.7  --   --   --   --   --   --   HGB 8.8*  < > 9.2* 8.0* 7.9* 7.7* 7.5*  HCT 27.6*  < > 29.3* 25.2* 24.9* 23.9* 23.7*  MCV 90.2  --  91.0 91.6 90.2 89.8 89.8  PLT 185  --  221 177 182 165 190  <  > = values in this interval not displayed. Cardiac Enzymes: No results for input(s): CKTOTAL, CKMB, CKMBINDEX, TROPONINI in the last 168 hours. BNP: BNP (last 3 results)  Recent Labs  11/08/14 1236 01/11/15 1942  BNP 300.9* 248.0*    ProBNP (last 3 results) No results for input(s): PROBNP in the last 8760 hours.  CBG:  Recent Labs Lab 01/17/15 1148 01/17/15 1658 01/17/15 2117 01/18/15 0507 01/18/15 1229  GLUCAP 132* 274* 381* 209* 151*       SignedAlbertine Grates MD, PhD  [facility] 01/18/2015, 2:55 PM

## 2015-01-19 ENCOUNTER — Telehealth: Payer: Self-pay | Admitting: *Deleted

## 2015-01-19 NOTE — Telephone Encounter (Signed)
Called pt concerning TCM appt no answer LMOM RTC.../lmb 

## 2015-01-19 NOTE — Telephone Encounter (Signed)
Pt return call back completed TCM below.Raechel Chute./lmb  Transition Care Management Follow-up Telephone Call D/C 01/18/15  How have you been since you were released from the hospital? Pt states he is doing ok   Do you understand why you were in the hospital? YES   Do you understand the discharge instrcutions? YES  Items Reviewed:  Medications reviewed: YES  Allergies reviewed: YES  Dietary changes reviewed: YES, heart healthy  Referrals reviewed: YES, has been contacted by [facility]   Functional Questionnaire:   Activities of Daily Living (ADLs):   He states he are independent in the following: ambulation, bathing and hygiene, feeding, continence, grooming, toileting and dressing States he require assistance with the following: ambulation   Any transportation issues/concerns?: NO   Any patient concerns? NO   Confirmed importance and date/time of follow-up visits scheduled: YES, appt made 01/27/15 w/Dr. Dorise HissKollar   Confirmed with patient if condition begins to worsen call PCP or go to the ER.

## 2015-01-23 ENCOUNTER — Telehealth: Payer: Self-pay | Admitting: Internal Medicine

## 2015-01-23 NOTE — Telephone Encounter (Signed)
Talked with Tresa EndoKelly and I let her know Dr. Dorise HissKollar approved social worker

## 2015-01-23 NOTE — Telephone Encounter (Signed)
Verbal order for a social work referral requested.

## 2015-01-23 NOTE — Telephone Encounter (Signed)
Approve verbal for Child psychotherapistsocial worker.

## 2015-01-26 ENCOUNTER — Telehealth: Payer: Self-pay | Admitting: Internal Medicine

## 2015-01-26 NOTE — Telephone Encounter (Signed)
Will discuss at visit tomorrow

## 2015-01-26 NOTE — Telephone Encounter (Signed)
BP has dropped during therapy today to 126/70 lying down.  Dropped to 71/52 in standing.  Went back up to 112/68 with legs elevated.  He had stopped taking blood pressure meds.  Started back up taking last night. Patient was dizzy and seeing spots.

## 2015-01-26 NOTE — Telephone Encounter (Signed)
I called and spoke with the patient's caregiver. He will keep his appt tomorrow and if he gets worse, he will go to the emergency room, or urgent care.

## 2015-01-27 ENCOUNTER — Encounter: Payer: Self-pay | Admitting: Internal Medicine

## 2015-01-27 ENCOUNTER — Ambulatory Visit (INDEPENDENT_AMBULATORY_CARE_PROVIDER_SITE_OTHER): Payer: Medicare Other | Admitting: Internal Medicine

## 2015-01-27 ENCOUNTER — Other Ambulatory Visit (INDEPENDENT_AMBULATORY_CARE_PROVIDER_SITE_OTHER): Payer: Medicare Other

## 2015-01-27 VITALS — BP 92/48 | HR 67 | Temp 98.4°F | Resp 16 | Wt 331.0 lb

## 2015-01-27 DIAGNOSIS — I5032 Chronic diastolic (congestive) heart failure: Secondary | ICD-10-CM

## 2015-01-27 DIAGNOSIS — N179 Acute kidney failure, unspecified: Secondary | ICD-10-CM | POA: Diagnosis not present

## 2015-01-27 DIAGNOSIS — D509 Iron deficiency anemia, unspecified: Secondary | ICD-10-CM

## 2015-01-27 DIAGNOSIS — J9601 Acute respiratory failure with hypoxia: Secondary | ICD-10-CM

## 2015-01-27 DIAGNOSIS — I5033 Acute on chronic diastolic (congestive) heart failure: Secondary | ICD-10-CM | POA: Diagnosis not present

## 2015-01-27 DIAGNOSIS — R040 Epistaxis: Secondary | ICD-10-CM

## 2015-01-27 LAB — CBC
Hemoglobin: 8.8 g/dL — ABNORMAL LOW (ref 13.0–17.0)
MCHC: 33.3 g/dL (ref 30.0–36.0)
MCV: 84.8 fl (ref 78.0–100.0)
PLATELETS: 276 10*3/uL (ref 150.0–400.0)
RBC: 3.11 Mil/uL — AB (ref 4.22–5.81)
RDW: 15.5 % (ref 11.5–15.5)
WBC: 9.6 10*3/uL (ref 4.0–10.5)

## 2015-01-27 LAB — COMPREHENSIVE METABOLIC PANEL
ALBUMIN: 3.2 g/dL — AB (ref 3.5–5.2)
ALK PHOS: 65 U/L (ref 39–117)
ALT: 11 U/L (ref 0–53)
AST: 14 U/L (ref 0–37)
BUN: 52 mg/dL — ABNORMAL HIGH (ref 6–23)
CO2: 35 mEq/L — ABNORMAL HIGH (ref 19–32)
Calcium: 9.4 mg/dL (ref 8.4–10.5)
Chloride: 99 mEq/L (ref 96–112)
Creatinine, Ser: 1.81 mg/dL — ABNORMAL HIGH (ref 0.40–1.50)
GFR: 39.73 mL/min — ABNORMAL LOW (ref >60.00)
Glucose, Bld: 92 mg/dL (ref 70–99)
POTASSIUM: 4.5 meq/L (ref 3.5–5.1)
Sodium: 139 mEq/L (ref 135–145)
Total Bilirubin: 0.5 mg/dL (ref 0.2–1.2)
Total Protein: 7 g/dL (ref 6.0–8.3)

## 2015-01-27 NOTE — Assessment & Plan Note (Signed)
No recurrent epistaxis. He has stopped taking the iron pills due to constipation. Recheck CBC today.

## 2015-01-27 NOTE — Patient Instructions (Signed)
We will have you stop taking the coreg at all for now due to the low blood pressure.   Keep taking the torsemide 1 pill in the morning and 1 pill at night.   We are checking your blood today and will call with the results.   We have filled out and faxed the papers and given you a copy.

## 2015-01-27 NOTE — Assessment & Plan Note (Signed)
No recurrence, he was supposed to hold his xarelto however he has been taking it since discharge.

## 2015-01-27 NOTE — Assessment & Plan Note (Signed)
Patient stopped taking Coreg due to low blood pressures. He continues to take torsemide however his excessive eating out including [place], [place] food has increased his weight 5 pounds since discharge. Due to his low blood pressure today will not increase his torsemide. Check BMP for any electrolyte disturbance. Not in acute exacerbation today. No rales in the lungs or edema in the feet.

## 2015-01-27 NOTE — Assessment & Plan Note (Addendum)
Still using oxygen at home. Has filled out fl-2 forms that he can go to short-term rehabilitation.

## 2015-01-27 NOTE — Assessment & Plan Note (Signed)
Recheck BMP today. 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

## 2015-01-27 NOTE — Progress Notes (Signed)
   Subjective:    Patient ID: Mark Roberson, male    DOB: Jun 01, 1946, 69 y.o.   MRN: [id number]  HPI The patient is a 69 YO man coming in for hospital follow up. He has been weak since being home. Able to get around with walker but for long distances needs wheelchair. Has been not following his sodium restricted diet. Eaten chinese, take out, and [place] since leaving the hospital and is up about 5 pounds since leaving the hospital at 324. This morning was about 329 at home. He has been taking his medicines but his blood pressure has been running low. His friend that he stays with has been gone and usually manages his medicines and he has been taking what he feels is appropriate. He is trying to go to a rehab facility to build back his strength. He is taking his torsemide. Denies syncope but has to stop and rest. Still urinating well. Has physical therapy coming to the house but not enough to really help. Is having more issues with incontinence due to not being able to get to the bathroom.   Review of Systems  Constitutional: Positive for unexpected weight change. Negative for fever, chills, appetite change and fatigue.  HENT: Negative.   Eyes: Negative.   Respiratory: Negative for cough, chest tightness, shortness of breath and wheezing.   Cardiovascular: Negative for chest pain, palpitations and leg swelling.  Gastrointestinal: Negative for nausea, abdominal pain, diarrhea and abdominal distention.  Genitourinary:       Incontinence  Musculoskeletal: Positive for arthralgias and gait problem.  Skin: Negative for wound.  Neurological: Positive for weakness. Negative for dizziness and light-headedness.      Objective:   Physical Exam  Constitutional: He is oriented to person, place, and time. He appears well-developed and well-nourished.  Morbidly obese  HENT:  Head: Normocephalic and atraumatic.  Eyes: EOM are normal.  Neck: Normal range of motion.  Cardiovascular: Normal rate.     Pulmonary/Chest: Effort normal. No respiratory distress. He has no wheezes. He has no rales.  Abdominal: Soft. Bowel sounds are normal. He exhibits no distension. There is no tenderness. There is no rebound.  Musculoskeletal: He exhibits no edema.  Neurological: He is alert and oriented to person, place, and time. Coordination normal.  Using wheelchair today  Skin: Skin is warm and dry.  Psychiatric: He has a normal mood and affect. His behavior is normal.   Filed Vitals:   01/27/15 1102  BP: 92/48  Pulse: 67  Temp: 98.4 F (36.9 C)  TempSrc: Oral  Resp: 16  Weight: 331 lb (150.141 kg)  SpO2: 93%      Assessment & Plan:

## 2015-01-27 NOTE — Progress Notes (Signed)
Pre visit review using our clinic review tool, if applicable. No additional management support is needed unless otherwise documented below in the visit note. 

## 2015-01-31 ENCOUNTER — Ambulatory Visit (HOSPITAL_COMMUNITY): Payer: Medicare Other | Admitting: Dentistry

## 2015-02-01 ENCOUNTER — Ambulatory Visit (HOSPITAL_COMMUNITY): Payer: Self-pay | Admitting: Dentistry

## 2015-02-01 ENCOUNTER — Encounter (HOSPITAL_COMMUNITY): Payer: Self-pay | Admitting: Dentistry

## 2015-02-01 ENCOUNTER — Encounter: Payer: Self-pay | Admitting: Adult Health

## 2015-02-01 ENCOUNTER — Non-Acute Institutional Stay (SKILLED_NURSING_FACILITY): Payer: Medicare Other | Admitting: Adult Health

## 2015-02-01 VITALS — BP 122/53 | HR 62 | Temp 98.2°F

## 2015-02-01 DIAGNOSIS — E1122 Type 2 diabetes mellitus with diabetic chronic kidney disease: Secondary | ICD-10-CM | POA: Diagnosis not present

## 2015-02-01 DIAGNOSIS — R5381 Other malaise: Secondary | ICD-10-CM | POA: Diagnosis not present

## 2015-02-01 DIAGNOSIS — N189 Chronic kidney disease, unspecified: Secondary | ICD-10-CM

## 2015-02-01 DIAGNOSIS — D509 Iron deficiency anemia, unspecified: Secondary | ICD-10-CM | POA: Diagnosis not present

## 2015-02-01 DIAGNOSIS — I5032 Chronic diastolic (congestive) heart failure: Secondary | ICD-10-CM | POA: Diagnosis not present

## 2015-02-01 DIAGNOSIS — G629 Polyneuropathy, unspecified: Secondary | ICD-10-CM | POA: Diagnosis not present

## 2015-02-01 DIAGNOSIS — K08409 Partial loss of teeth, unspecified cause, unspecified class: Secondary | ICD-10-CM

## 2015-02-01 DIAGNOSIS — I4892 Unspecified atrial flutter: Secondary | ICD-10-CM | POA: Diagnosis not present

## 2015-02-01 DIAGNOSIS — N179 Acute kidney failure, unspecified: Secondary | ICD-10-CM | POA: Diagnosis not present

## 2015-02-01 DIAGNOSIS — K0530 Chronic periodontitis, unspecified: Secondary | ICD-10-CM

## 2015-02-01 DIAGNOSIS — K036 Deposits [accretions] on teeth: Secondary | ICD-10-CM

## 2015-02-01 DIAGNOSIS — I10 Essential (primary) hypertension: Secondary | ICD-10-CM

## 2015-02-01 NOTE — Progress Notes (Signed)
POST OPERATIVE NOTE:  02/01/2015   Mark Roberson [id number]  VITALS: BP 122/53 mmHg  Pulse 62  Temp(Src) 98.2 F (36.8 C) (Oral)   LABS:  Lab Results  Component Value Date   WBC 9.6 01/27/2015   HGB 8.8 Repeated and verified X2.* 01/27/2015   HCT 26.4 Repeated and verified X2.* 01/27/2015   MCV 84.8 01/27/2015   PLT 276.0 01/27/2015   BMET    Component Value Date/Time   NA 139 01/27/2015 1152   K 4.5 01/27/2015 1152   CL 99 01/27/2015 1152   CO2 35* 01/27/2015 1152   GLUCOSE 92 01/27/2015 1152   BUN 52* 01/27/2015 1152   CREATININE 1.81* 01/27/2015 1152   CALCIUM 9.4 01/27/2015 1152   GFRNONAA 35* 01/18/2015 0434   GFRAA 41* 01/18/2015 0434    No results found for: INR, PROTIME No results found for: PTT   Mark GoldsMichael Roberson is status post multiple extractions with alveoloplasty in the operating room with general anesthesia on 01/17/15.  SUBJECTIVE: Patient with minimal complaints of pain from dental extraction sites. Stitches are gone. Patient has some tenderness and bleeding upon his teeth at the posterior aspect of the upper left premolars.  EXAM: There is no sign of infection, heme, or ooze. Patient is healing in by secondary intention. Plaque and calculus noted with the need for further follow-up with general dentist of his choice for an exam and cleaning.  PROCEDURE: The patient was given a chlorhexidine gluconate rinse for 30 seconds. No sutures needed to be removed.  ASSESSMENT: Post operative course is consistent with dental procedures performed in the OR on 01/17/15. Loss of teeth by extraction. Chronic periodontitis Accretions  PLAN: 1. Continue chlorhexidine rinses after breakfast and at bedtime as prescribed. 2. Use salt water rinses in between the chlorhexidine rinses as needed to aid healing. 3. Brush after meals and at bedtime. 4. Follow-up with general dentist of his choice for exam, radiographs, and discussion of other dental treatment needs  including periodontal therapy.   Charlynne Panderonald F. Keithen Capo, DDS

## 2015-02-01 NOTE — Patient Instructions (Signed)
PLAN: 1. Continue chlorhexidine rinses after breakfast and at bedtime as prescribed. 2. Use salt water rinses in between the chlorhexidine rinses as needed to aid healing. 3. Brush after meals and at bedtime. 4. Follow-up with general dentist of his choice for exam, radiographs, and discussion of other dental treatment needs including periodontal therapy. S[facility]ed [facility] [place] [facility].  Charlynne Panderonald F. Toni Demo, DDS

## 2015-02-02 ENCOUNTER — Telehealth: Payer: Self-pay | Admitting: Internal Medicine

## 2015-02-02 NOTE — Telephone Encounter (Signed)
Spoke with Marcelino DusterMichelle at [facility]. She will increase novolog to 30 units before meals and he is taking 66 units of lantus currently. She will increase that to 71 units and make sure he is on a 3G salt, low carb modified diet.

## 2015-02-02 NOTE — Telephone Encounter (Signed)
Would increase novolog before meals to 30 units, how much lantus is he using? That needs to be increased about 5 units as well. Can we also modify his diet to a 3G salt, low carb modified diet.

## 2015-02-02 NOTE — Progress Notes (Signed)
Patient ID: Mark Roberson, male   DOB: March 29, 1946, 69 y.o.   MRN: [id number]   02/01/15  Facility:  Nursing Home Location:  [facility] Room Number: 508-P LEVEL OF CARE:  SNF (31)   Chief Complaint  Patient presents with  . Hospitalization Follow-up    Physical deconditioning, diastolic heart failure, atrial fibrillation, diabetes mellitus, neuropathy, hyperlipidemia, hypertension and anemia    HISTORY OF PRESENT ILLNESS:  This is a 69 year old male was been admitted to [facility] on 01/31/15. He was recently discharged home on 01/18/15 from [facility] with principal problem of acute on chronic diastolic CHF.  He has past medical history of diastolic CHF, atrial fibrillation and diabetes mellitus.  He has been admitted for a short-term rehabilitation.  PAST MEDICAL HISTORY:  Past Medical History  Diagnosis Date  . CHF (congestive heart failure)   . Atrial fibrillation   . Diabetes mellitus without complication   . Hypertension   . Sleep apnea   . Asthma     CURRENT MEDICATIONS: Reviewed per MAR/see medication list  Allergies  Allergen Reactions  . Percocet [Oxycodone-Acetaminophen] Other (See Comments)    hallucination  . Penicillins Hives     REVIEW OF SYSTEMS:  GENERAL: no change in appetite, no fatigue, no weight changes, no fever, chills or weakness RESPIRATORY: no cough, SOB, DOE, wheezing, hemoptysis CARDIAC: no chest pain,  or palpitations GI: no abdominal pain, diarrhea, constipation, heart burn, nausea or vomiting  PHYSICAL EXAMINATION  GENERAL: no acute distress, obese EYES: conjunctivae normal, sclerae normal, normal eye lids NECK: supple, trachea midline, no neck masses, no thyroid tenderness, no thyromegaly LYMPHATICS: no LAN in the neck, no supraclavicular LAN RESPIRATORY: breathing is even & unlabored, BS CTAB CARDIAC: RRR, no murmur,no extra heart sounds, BLE edema 2+ GI: abdomen soft, normal BS, no  masses, no tenderness, no hepatomegaly, no splenomegaly EXTREMITIES: Able to move 4 extremities PSYCHIATRIC: the patient is alert & oriented to person, affect & behavior appropriate  LABS/RADIOLOGY: Labs reviewed: Basic Metabolic Panel:  Recent Labs  40/98/11 0500  01/17/15 0611 01/18/15 0434 01/27/15 1152  NA  --   < > 138 132* 139  K  --   < > 4.1 4.7 4.5  CL  --   < > 96 93* 99  CO2  --   < > 30 29 35*  GLUCOSE  --   < > 106* 229* 92  BUN  --   < > 32* 38* 52*  CREATININE  --   < > 1.63* 1.87* 1.81*  CALCIUM  --   < > 8.7 8.4 9.4  MG 1.9  --   --   --   --   < > = values in this interval not displayed. Liver Function Tests:  Recent Labs  11/09/14 0505 01/27/15 1152  AST 22 14  ALT 18 11  ALKPHOS 57 65  BILITOT 0.8 0.5  PROT 6.4 7.0  ALBUMIN 3.1* 3.2*   CBC:  Recent Labs  01/11/15 1942  01/17/15 0611 01/18/15 0434 01/27/15 1152  WBC 9.9  < > 9.0 9.9 9.6  NEUTROABS 6.7  --   --   --   --   HGB 8.8*  < > 7.7* 7.5* 8.8 Repeated and verified X2.*  HCT 27.6*  < > 23.9* 23.7* 26.4 Repeated and verified X2.*  MCV 90.2  < > 89.8 89.8 84.8  PLT 185  < > 165 190 276.0  < > =  values in this interval not displayed.  Lipid Panel:  Recent Labs  11/14/14 1717  HDL 30.10*   Cardiac Enzymes:  Recent Labs  11/08/14 1906 11/08/14 2239 11/09/14 0505  TROPONINI 0.03 0.03 0.03   CBG:  Recent Labs  01/18/15 0507 01/18/15 1229 01/18/15 1739  GLUCAP 209* 151* 213*     Dg Orthopantogram  01/16/2015   CLINICAL DATA:  Left facial swelling.  EXAM: ORTHOPANTOGRAM/PANORAMIC  COMPARISON:  Face CT from 2 days ago  FINDINGS: Large cavities in the left upper molars with periapical erosions.  The mandible is intact and located. No mandibular erosion. Gross symmetric aeration of the bilateral maxillary sinuses.  IMPRESSION: Large cavities in the left upper molars with periapical erosions.   Electronically Signed   By: Marnee SpringJonathon  Watts M.D.   On: 01/16/2015 12:38   [place]  Soft Tissue Head/neck  01/17/2015   CLINICAL DATA:  Left cheek swelling.  Evaluate for abscess.  EXAM: ULTRASOUND OF HEAD/NECK SOFT TISSUES  TECHNIQUE: Ultrasound examination of the head and neck soft tissues was performed in the area of clinical concern.  COMPARISON:  Contrast-enhanced maxillofacial CT- 01/14/2015; panoramic radiograph - 01/16/2015  FINDINGS: There is a serpiginous mixed echogenic approximately 2.5 x 0.8 x 1.9 cm structure within the subcutaneous tissues of the left cheek which likely correlates with the patient's palpable area of concern. No definitive blood flow was demonstrated within this fluid collection, however thin internal echogenic septations are seen within this structure on the provided cinegraphic images.  IMPRESSION: Small serpiginous approximately 2.5 cm structure correlates with the patient's palpable area of concern within the left cheek - while incompletely characterized on the present examination and potentially representative of a complex fluid collection, this structure is favored to represent the loculated subcutaneous fat that was demonstrated on recently performed contrast-enhanced maxillofacial CT. Clinical correlation is advised.   Electronically Signed   By: Simonne ComeJohn  Watts M.D.   On: 01/17/2015 16:42   Ct Maxillofacial W/cm  01/15/2015   CLINICAL DATA:  Left sided facial swelling and erythema. Evaluate for dental abscess.  EXAM: CT MAXILLOFACIAL WITH CONTRAST  TECHNIQUE: Multidetector CT imaging of the maxillofacial structures was performed with intravenous contrast. Multiplanar CT image reconstructions were also generated. A small metallic BB was placed on the right temple in order to reliably differentiate right from left.  CONTRAST:  100mL OMNIPAQUE IOHEXOL 300 MG/ML IV.  COMPARISON:  None.  FINDINGS: Metallic beam hardening streak artifact from the patient's dental work obscures fine detail on many of the images.  Large dental caries involving the 1st, 2nd and 3rd  molars in the left side of the maxilla (teeth # 14, 15 and 16), each associated with periapical lucency. Teeth 15 and 16 have amalgam which creates abundant streak artifact. Edema/induration is present in the subcutaneous fat overlying the maxilla, extending upward into the infraorbital region. The left masseter muscle and the left pterygoid muscles are symmetric with those on the right. The fat in the masseter space is preserved. I do not identify a discrete abscess. Reactive level 2 nodes on the left, the largest measuring approximately 1.4 x 0.9 cm. No nodal masses.  The 3rd molar in the right side of the maxilla (tooth # 1) is incompletely erupted. No intrinsic abnormalities involving the facial bones otherwise. Temporomandibular joints intact.  Mucosal thickening involving the maxillary sinuses, left greater than right. Remaining paranasal sinuses, bilateral mastoid air cells and bilateral middle ear cavities well aerated. Midline bony nasal septum.  Visualized portions of  the brain are unremarkable. Extensive bilateral carotid siphon atherosclerosis.  IMPRESSION: 1. Large dental caries involving the 1st, 2nd and 3rd molars of the left side of the maxilla (teeth # 14, 15 and 16) each associated with periapical lucency consistent with periapical abscess. 2. Extensive edema/induration in the subcutaneous fat overlying the left side of the maxilla. A discrete soft tissue abscess is not identified. 3. Scattered reactive level 2 left cervical lymph nodes. No nodal masses. 4. Mild chronic bilateral maxillary sinus disease, left greater than right.   Electronically Signed   By: Hulan Saashomas  Lawrence M.D.   On: 01/15/2015 07:26   Dg Chest Port 1 View  01/11/2015   CLINICAL DATA:  Shortness of Breath  EXAM: PORTABLE CHEST - 1 VIEW  COMPARISON:  11/08/2014  FINDINGS: Cardiomegaly in stable in appearance.  The right hemidiaphragm is elevated and stable. No focal infiltrate or sizable effusion is seen.  IMPRESSION: No acute  abnormality noted.   Electronically Signed   By: Alcide CleverMark  Lukens M.D.   On: 01/11/2015 20:39    ASSESSMENT/PLAN:  Physical deconditioning - for rehabilitation Chronic diastolic CHF - continue torsemide 40 mg by mouth daily, lisinopril 20 mg by mouth daily and Coreg 3.125 mg by mouth twice a day, O2 at 2 L/minute, daily weights, Neuropathy - continue Neurontin 300 mg by mouth 3 times a day Hyperlipidemia - continue Lipitor 20 mg by mouth daily at bedtime Hypertension - continue lisinopril 20 mg by mouth daily at bedtime and Coreg 3.125 mg by mouth twice a day Atrial Flutter - rate controlled; continue Xarelto 20 mg by mouth daily and Coreg 3.125 mg by mouth twice a day Diabetes mellitus, type II with renal complications - hemoglobin A1c 6.6; continue Lantus 66 units subcutaneous daily at bedtime and Humalog 25 units subcutaneous 3 times a day before meals Anemia, iron deficiency - hemoglobin 8.8; has a diffuse iron supplementation and blood transfusion; will monitor Acute kidney injury - creatinine 1.81; will monitor    Goals of care:  Short-term rehabilitation   Labs/test ordered: CBC, BMP, HgbA1c, Thyroid panel  Spent 50 minutes in patient care.    [facility] Center[facility]ARGAS,Brylin Stopper, NP [facility] [phone]

## 2015-02-02 NOTE — Telephone Encounter (Signed)
Please call Marcelino [facility]Michelle at [facility] at [phone] about patients sugar level elevated between [phone]  Before lunch and dinner. They are giving him 25 units of novolog and he's wanting  35-50 units. He stated he gives himself that much at home and they are not sure that is even safe.

## 2015-02-03 ENCOUNTER — Non-Acute Institutional Stay (SKILLED_NURSING_FACILITY): Payer: Medicare Other | Admitting: Internal Medicine

## 2015-02-03 ENCOUNTER — Ambulatory Visit (HOSPITAL_BASED_OUTPATIENT_CLINIC_OR_DEPARTMENT_OTHER): Payer: Medicare Other | Attending: Internal Medicine

## 2015-02-03 VITALS — Ht 72.0 in | Wt 326.0 lb

## 2015-02-03 DIAGNOSIS — G4719 Other hypersomnia: Secondary | ICD-10-CM | POA: Diagnosis not present

## 2015-02-03 DIAGNOSIS — M792 Neuralgia and neuritis, unspecified: Secondary | ICD-10-CM | POA: Diagnosis not present

## 2015-02-03 DIAGNOSIS — L89152 Pressure ulcer of sacral region, stage 2: Secondary | ICD-10-CM

## 2015-02-03 DIAGNOSIS — R0683 Snoring: Secondary | ICD-10-CM | POA: Insufficient documentation

## 2015-02-03 DIAGNOSIS — E1122 Type 2 diabetes mellitus with diabetic chronic kidney disease: Secondary | ICD-10-CM

## 2015-02-03 DIAGNOSIS — N189 Chronic kidney disease, unspecified: Secondary | ICD-10-CM

## 2015-02-03 DIAGNOSIS — G4710 Hypersomnia, unspecified: Secondary | ICD-10-CM

## 2015-02-03 DIAGNOSIS — D638 Anemia in other chronic diseases classified elsewhere: Secondary | ICD-10-CM | POA: Diagnosis not present

## 2015-02-03 DIAGNOSIS — I110 Hypertensive heart disease with heart failure: Secondary | ICD-10-CM | POA: Diagnosis not present

## 2015-02-03 DIAGNOSIS — R5381 Other malaise: Secondary | ICD-10-CM

## 2015-02-03 DIAGNOSIS — I5032 Chronic diastolic (congestive) heart failure: Secondary | ICD-10-CM

## 2015-02-03 DIAGNOSIS — I509 Heart failure, unspecified: Secondary | ICD-10-CM

## 2015-02-03 DIAGNOSIS — I480 Paroxysmal atrial fibrillation: Secondary | ICD-10-CM

## 2015-02-03 DIAGNOSIS — L98429 Non-pressure chronic ulcer of back with unspecified severity: Secondary | ICD-10-CM

## 2015-02-03 NOTE — Progress Notes (Signed)
Patient ID: Mark GoldsMichael Roberson, male   DOB: 01-18-1946, 69 y.o.   MRN: [id number]     [facility]   PCP: Judie BonusKollar, Elizabeth A, MD  Code Status: full code  Allergies  Allergen Reactions  . Percocet [Oxycodone-Acetaminophen] Other (See Comments)    hallucination  . Penicillins Hives    Chief Complaint  Patient presents with  . New Admit To SNF     HPI:  69 year old patient is here for short term rehabilitation for physical deconditioning. He was in the hospital recently with chf exacerbation. He has past medical history of diastolic CHF, atrial fibrillation and diabetes mellitus. He is seen in his room today. He denies any concerns. He has been working with therapy team.   Review of Systems:  Constitutional: positive for fatigue. Negative for fever, chills, diaphoresis.  HENT: Negative for headache, congestion, nasal discharge  Respiratory: Negative for cough, shortness of breath and wheezing.   Cardiovascular: Negative for chest pain, palpitations, leg swelling.  Gastrointestinal: Negative for heartburn, nausea, vomiting, abdominal pain Genitourinary: Negative for dysuria Musculoskeletal: Negative for back pain, falls Skin: Negative for itching, rash.  Neurological: Negative for dizziness, tingling, focal weakness Psychiatric/Behavioral: Negative for depression   Past Medical History  Diagnosis Date  . CHF (congestive heart failure)   . Atrial fibrillation   . Diabetes mellitus without complication   . Hypertension   . Sleep apnea   . Asthma    Past Surgical History  Procedure Laterality Date  . 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 Wound debridement Right   . Multiple extractions with alveoloplasty N/A 01/17/2015    Procedure: Extraction of tooth #'s 14,15, 16 with alveolopalsty;  Surgeon: Charlynne Panderonald F Kulinski, DDS;  [facility];  Service: Oral Surgery;  Laterality: N/A;   Social History:   reports that he has never smoked. He has  never used smokeless tobacco. He reports that he drinks alcohol. He reports that he does not use illicit drugs.  Family History  Problem Relation Age of Onset  . COPD Mother   . Heart disease Father   . Cancer Sister     breast  . Heart disease Brother     Medications: Patient's Medications  New Prescriptions   No medications on file  Previous Medications   ALBUTEROL (PROVENTIL HFA;VENTOLIN HFA) 108 (90 BASE) MCG/ACT INHALER    Inhale 2 puffs into the lungs every 6 (six) hours as needed for shortness of breath (shortness of breath).   ALBUTEROL (PROVENTIL) (2.5 MG/3ML) 0.083% NEBULIZER SOLUTION    Take 3 mLs (2.5 mg total) by nebulization every 6 (six) hours as needed for wheezing or shortness of breath.   ATORVASTATIN (LIPITOR) 20 MG TABLET    Take 20 mg by mouth at bedtime.    CHLORHEXIDINE (PERIDEX) 0.12 % SOLUTION    Use as directed 15 mLs in the mouth or throat 2 (two) times daily.   CHOLECALCIFEROL (VITAMIN D) 1000 UNITS TABLET    Take 1,000 Units by mouth at bedtime.   FLUTICASONE (FLONASE) 50 MCG/ACT NASAL SPRAY    Place 2 sprays into both nostrils daily.   GABAPENTIN (NEURONTIN) 300 MG CAPSULE    Take 300 mg by mouth 3 (three) times daily.   HUMALOG KWIKPEN 100 UNIT/ML KIWKPEN    Inject 25 Units into the skin 3 (three) times daily.    INSULIN GLARGINE (LANTUS) 100 UNIT/ML INJECTION    Inject 66 Units into the skin  at bedtime.   MAGNESIUM OXIDE (MAG-OX) 400 MG TABLET    Take 400 mg by mouth at bedtime.   MISC. DEVICES (WHEELCHAIR CUSHION) MISC    Use to cushion wheelchair   OMEGA-3 ACID ETHYL ESTERS (LOVAZA) 1 G CAPSULE    Take 2 g by mouth at bedtime.   SACCHAROMYCES BOULARDII (FLORASTOR) 250 MG CAPSULE    Take 1 capsule (250 mg total) by mouth 2 (two) times daily.   TORSEMIDE (DEMADEX) 20 MG TABLET    Take 2 tablets (40 mg total) by mouth daily.   TRANSPARENT DRESSINGS (TEGADERM + PAD 3-1/2"X6") MISC    Apply to wound daily   XARELTO 20 MG TABS TABLET    Take 20 mg by mouth  daily with supper.   Modified Medications   No medications on file  Discontinued Medications   No medications on file     Physical Exam: Filed Vitals:   02/03/15 1206  BP: 110/54  Pulse: 60  Temp: 97.1 F (36.2 C)  Resp: 18  Height: 6' (1.829 m)  Weight: 326 lb (147.873 kg)  SpO2: 98%   Wt Readings from Last 3 Encounters:  02/03/15 326 lb (147.873 kg)  02/01/15 330 lb (149.687 kg)  01/27/15 331 lb (150.141 kg)    General- elderly obese male in no acute distress Head- normocephalic, atraumatic Throat- moist mucus membrane Eyes- no pallor, no icterus, no discharge, normal conjunctiva, normal sclera Neck- no cervical lymphadenopathy, no jugular vein distension Cardiovascular- normal s1,s2, no murmurs, palpable dorsalis pedis, trace leg edema Respiratory- bilateral clear to auscultation, no wheeze, no rhonchi, no crackles, no use of accessory muscles Abdomen- bowel sounds present, soft, non tender Musculoskeletal- able to move all 4 extremities, generalized weakness Neurological- no focal deficit Skin- warm and dry Psychiatry- alert and oriented to person, place and time, normal mood and affect    Labs reviewed: Basic Metabolic Panel:  Recent Labs  16/10/96 0500  01/17/15 0611 01/18/15 0434 01/27/15 1152  NA  --   < > 138 132* 139  K  --   < > 4.1 4.7 4.5  CL  --   < > 96 93* 99  CO2  --   < > 30 29 35*  GLUCOSE  --   < > 106* 229* 92  BUN  --   < > 32* 38* 52*  CREATININE  --   < > 1.63* 1.87* 1.81*  CALCIUM  --   < > 8.7 8.4 9.4  MG 1.9  --   --   --   --   < > = values in this interval not displayed. Liver Function Tests:  Recent Labs  11/09/14 0505 01/27/15 1152  AST 22 14  ALT 18 11  ALKPHOS 57 65  BILITOT 0.8 0.5  PROT 6.4 7.0  ALBUMIN 3.1* 3.2*   No results for input(s): LIPASE, AMYLASE in the last 8760 hours. No results for input(s): AMMONIA in the last 8760 hours. CBC:  Recent Labs  01/11/15 1942  01/17/15 0611 01/18/15 0434  01/27/15 1152  WBC 9.9  < > 9.0 9.9 9.6  NEUTROABS 6.7  --   --   --   --   HGB 8.8*  < > 7.7* 7.5* 8.8 Repeated and verified X2.*  HCT 27.6*  < > 23.9* 23.7* 26.4 Repeated and verified X2.*  MCV 90.2  < > 89.8 89.8 84.8  PLT 185  < > 165 190 276.0  < > = values in this  interval not displayed. Cardiac Enzymes:  Recent Labs  11/08/14 1906 11/08/14 2239 11/09/14 0505  TROPONINI 0.03 0.03 0.03   BNP: Invalid input(s): POCBNP CBG:  Recent Labs  01/18/15 0507 01/18/15 1229 01/18/15 1739  GLUCAP 209* 151* 213*   Lab Results  Component Value Date   HGBA1C 6.6* 11/14/2014    Radiological Exams: Dg Orthopantogram  01/16/2015   CLINICAL DATA:  Left facial swelling.  EXAM: ORTHOPANTOGRAM/PANORAMIC  COMPARISON:  Face CT from 2 days ago  FINDINGS: Large cavities in the left upper molars with periapical erosions.  The mandible is intact and located. No mandibular erosion. Gross symmetric aeration of the bilateral maxillary sinuses.  IMPRESSION: Large cavities in the left upper molars with periapical erosions.   Electronically Signed   By: Marnee SpringJonathon  Watts M.D.   On: 01/16/2015 12:38   [place] Soft Tissue Head/neck  01/17/2015   CLINICAL DATA:  Left cheek swelling.  Evaluate for abscess.  EXAM: ULTRASOUND OF HEAD/NECK SOFT TISSUES  TECHNIQUE: Ultrasound examination of the head and neck soft tissues was performed in the area of clinical concern.  COMPARISON:  Contrast-enhanced maxillofacial CT- 01/14/2015; panoramic radiograph - 01/16/2015  FINDINGS: There is a serpiginous mixed echogenic approximately 2.5 x 0.8 x 1.9 cm structure within the subcutaneous tissues of the left cheek which likely correlates with the patient's palpable area of concern. No definitive blood flow was demonstrated within this fluid collection, however thin internal echogenic septations are seen within this structure on the provided cinegraphic images.  IMPRESSION: Small serpiginous approximately 2.5 cm structure correlates  with the patient's palpable area of concern within the left cheek - while incompletely characterized on the present examination and potentially representative of a complex fluid collection, this structure is favored to represent the loculated subcutaneous fat that was demonstrated on recently performed contrast-enhanced maxillofacial CT. Clinical correlation is advised.   Electronically Signed   By: Simonne ComeJohn  Watts M.D.   On: 01/17/2015 16:42   Ct Maxillofacial W/cm  01/15/2015   CLINICAL DATA:  Left sided facial swelling and erythema. Evaluate for dental abscess.  EXAM: CT MAXILLOFACIAL WITH CONTRAST  TECHNIQUE: Multidetector CT imaging of the maxillofacial structures was performed with intravenous contrast. Multiplanar CT image reconstructions were also generated. A small metallic BB was placed on the right temple in order to reliably differentiate right from left.  CONTRAST:  100mL OMNIPAQUE IOHEXOL 300 MG/ML IV.  COMPARISON:  None.  FINDINGS: Metallic beam hardening streak artifact from the patient's dental work obscures fine detail on many of the images.  Large dental caries involving the 1st, 2nd and 3rd molars in the left side of the maxilla (teeth # 14, 15 and 16), each associated with periapical lucency. Teeth 15 and 16 have amalgam which creates abundant streak artifact. Edema/induration is present in the subcutaneous fat overlying the maxilla, extending upward into the infraorbital region. The left masseter muscle and the left pterygoid muscles are symmetric with those on the right. The fat in the masseter space is preserved. I do not identify a discrete abscess. Reactive level 2 nodes on the left, the largest measuring approximately 1.4 x 0.9 cm. No nodal masses.  The 3rd molar in the right side of the maxilla (tooth # 1) is incompletely erupted. No intrinsic abnormalities involving the facial bones otherwise. Temporomandibular joints intact.  Mucosal thickening involving the maxillary sinuses, left  greater than right. Remaining paranasal sinuses, bilateral mastoid air cells and bilateral middle ear cavities well aerated. Midline bony nasal septum.  Visualized portions  of the brain are unremarkable. Extensive bilateral carotid siphon atherosclerosis.  IMPRESSION: 1. Large dental caries involving the 1st, 2nd and 3rd molars of the left side of the maxilla (teeth # 14, 15 and 16) each associated with periapical lucency consistent with periapical abscess. 2. Extensive edema/induration in the subcutaneous fat overlying the left side of the maxilla. A discrete soft tissue abscess is not identified. 3. Scattered reactive level 2 left cervical lymph nodes. No nodal masses. 4. Mild chronic bilateral maxillary sinus disease, left greater than right.   Electronically Signed   By: Hulan Saas M.D.   On: 01/15/2015 07:26   Dg Chest Port 1 View  01/11/2015   CLINICAL DATA:  Shortness of Breath  EXAM: PORTABLE CHEST - 1 VIEW  COMPARISON:  11/08/2014  FINDINGS: Cardiomegaly in stable in appearance.  The right hemidiaphragm is elevated and stable. No focal infiltrate or sizable effusion is seen.  IMPRESSION: No acute abnormality noted.   Electronically Signed   By: Alcide Clever M.D.   On: 01/11/2015 20:39    Assessment/Plan  Physical deconditioning Will have him work with physical therapy and occupational therapy team to help with gait training and muscle strengthening exercises.fall precautions. Skin care. Encourage to be out of bed.   Chronic diastolic CHF Weight stable. continue coreg 3.125 mg bid, torsemide 40 mg daily, lisinopril 20 mg daily with prn o2. Monitor weight  afib Rate controlled. Continue coreg for rate control and xarelto for anticoagulation  Hypertensive heart disease Stable, continue lisinopril 20 mg daily, monitor bp  Neuropathic pain Stable, continue Neurontin 300 mg tid  Dm type 2 with ckd a1c 6.6 in 2/16. cbg has been high. On lantus 71 u daily with humalog 30 u tid with  meals- dose adjusted on 02/02/15. Monitor cbg. Continue lipitor and lisinopril. Pending a1c  Anemia of chronic disease Monitor h&h, last hb 8.8. Monitor clinically  Stage 2 sacral ulcer Continue wound care, pressure ulcer prophylaxis   Goals of care: short term rehabilitation   Labs/tests ordered: cbc, bmp  Family/ staff Communication: reviewed care plan with patient and nursing supervisor    Oneal Grout, MD  [facility] [phone] (Monday-Friday 8 am - 5 pm) [phone] (afterhours)

## 2015-02-05 DIAGNOSIS — G4733 Obstructive sleep apnea (adult) (pediatric): Secondary | ICD-10-CM

## 2015-02-05 NOTE — Sleep Study (Signed)
   NAME: Macario GoldsMichael Eleazer DATE OF BIRTH:  16-Sep-1946 MEDICAL RECORD NUMBER [id number]  LOCATION: [facility]  PHYSICIAN: Kavi Almquist D  DATE OF STUDY: 02/03/2015  SLEEP STUDY TYPE: Nocturnal Polysomnogram               REFERRING PHYSICIAN: Albertine GratesXu, Fang, MD  INDICATION FOR STUDY: Hypersomnia with sleep apnea  EPWORTH SLEEPINESS SCORE:   13/24 HEIGHT: 6' (182.9 cm)  WEIGHT: (!) 326 lb (147.873 kg)    Body mass index is 44.2 kg/(m^2).  NECK SIZE: 19 in.  MEDICATIONS: Charted for review  SLEEP ARCHITECTURE: Split study protocol. During the diagnostic phase total sleep time 150.5 minutes with sleep efficiency 81.1%. Stage I was 5.3%, stage II 71.8%, stage III absent, REM 22.9% of total sleep time. Sleep latency 4.5 minutes, REM latency 75 minutes, awake after sleep onset 14 minutes, arousal index 12, bedtime medication: None  RESPIRATORY DATA: Apnea hypopnea index (AHI) 59.4 per hour. 149 total events scored including 46 obstructive apneas and 103 hypopneas. Events were seen in all sleep positions. REM AHI 67.8 per hour. CPAP was titrated to 14 CWP, AHI 0 per hour. He wore a medium nasal mask.  OXYGEN DATA: Moderately loud snoring before CPAP with oxygen desaturation to a nadir of 64% on room air. With CPAP control, snoring was prevented and mean oxygen saturation was 90.8% on room air.  CARDIAC DATA: On single-lead, rhythm appears to be atrial fibrillation with occasional aberrant conduction at mean heart rate 53.6, maximum heart rate 160/m  MOVEMENT/PARASOMNIA: 84 limb jerks with no sleep disturbance noted during the diagnostic phase. Only 5 limb jerks with no associated sleep disturbance noted during the titration phase with CPAP, indicating these were related to respiratory sleep disturbance. Bathroom 1  IMPRESSION/ RECOMMENDATION:   1) Severe obstructive sleep apnea/hypopnea syndrome, AHI 59.4 per hour with events in all positions. REM AHI 67.8 per hour. Moderately  loud snoring before CPAP with oxygen desaturation to a nadir of 64% on room air. 2) Successful CPAP titration to 14 CWP, AHI 0 per hour. He wore a medium [employer] nasal mask with heated humidifier. Snoring was prevented with mean oxygen saturation 90.8% on room air.  Waymon BudgeYOUNG,Anastaisa Wooding D Diplomate, American Board of Sleep Medicine  ELECTRONICALLY SIGNED ON:  02/05/2015, 11:03 AM [facility] PH: [phone]   FX: [phone] ACCREDITED BY THE AMERICAN ACADEMY OF SLEEP MEDICINE

## 2015-02-07 ENCOUNTER — Encounter: Payer: Self-pay | Admitting: Internal Medicine

## 2015-02-07 NOTE — Progress Notes (Signed)
This encounter was created in error - please disregard.

## 2015-02-17 ENCOUNTER — Telehealth: Payer: Self-pay | Admitting: Geriatric Medicine

## 2015-02-17 DIAGNOSIS — I5022 Chronic systolic (congestive) heart failure: Secondary | ICD-10-CM

## 2015-02-17 NOTE — Telephone Encounter (Signed)
Patient aware that orders have been placed.

## 2015-02-17 NOTE — Telephone Encounter (Signed)
Order placed to get into the heart failure clinic.

## 2015-02-17 NOTE — Telephone Encounter (Signed)
Patient has CHF and keeps filling up with liquid. Patient is requesting a referral to see a cardiologist asap. Call  [phone] after orders have been placed.

## 2015-02-22 ENCOUNTER — Telehealth: Payer: Self-pay | Admitting: Internal Medicine

## 2015-02-22 ENCOUNTER — Telehealth (HOSPITAL_COMMUNITY): Payer: Self-pay | Admitting: Vascular Surgery

## 2015-02-22 NOTE — Telephone Encounter (Signed)
Can you please call [place] SinkRita, patients care giver at [phone]. They have an appointment with cardiology on 6/14 and are needing to get in earlier and the only way to do that is if Dr. Dorise HissKollar call in there. I made her aware that Dr. Dorise HissKollar is out so she is wondering if maybe another provider can handle this

## 2015-02-23 NOTE — Telephone Encounter (Signed)
If there's something i can do, let me know

## 2015-02-23 NOTE — Telephone Encounter (Signed)
If there is continued fluid overload than he needs to be seen in the office or Urgent Care. You can try calling cardiology, however I cannot guarantee that he can be seen any sooner.

## 2015-02-23 NOTE — Telephone Encounter (Signed)
[place] SinkRita called again. Please give her a call asap at [phone]

## 2015-02-23 NOTE — Telephone Encounter (Signed)
Advised patient of greg's note, also advised that dr Dorise Hisskollar will be back on Tuesday if she would like to call back then

## 2015-02-24 ENCOUNTER — Encounter: Payer: Self-pay | Admitting: Internal Medicine

## 2015-02-24 ENCOUNTER — Non-Acute Institutional Stay (SKILLED_NURSING_FACILITY): Payer: Medicare Other | Admitting: Internal Medicine

## 2015-02-24 DIAGNOSIS — R635 Abnormal weight gain: Secondary | ICD-10-CM | POA: Diagnosis not present

## 2015-02-24 DIAGNOSIS — R0600 Dyspnea, unspecified: Secondary | ICD-10-CM | POA: Diagnosis not present

## 2015-02-24 DIAGNOSIS — I5032 Chronic diastolic (congestive) heart failure: Secondary | ICD-10-CM | POA: Diagnosis not present

## 2015-02-24 DIAGNOSIS — R600 Localized edema: Secondary | ICD-10-CM

## 2015-02-24 NOTE — Progress Notes (Signed)
Patient ID: Mark Roberson, male   DOB: 1946/01/07, 69 y.o.   MRN: [id number]    [facility]  PCP: [name], MD  Code Status: Full Code   Allergies  Allergen Reactions  . Percocet [Oxycodone-Acetaminophen] Other (See Comments)    hallucination  . Penicillins Hives    Chief Complaint  Patient presents with  . Acute Visit     HPI:  69 year old patient is seen for acute concerns. He has gained about 15 lbs since admission where he weighed 324 lb. He is here for short term rehabilitation for physical deconditioning. He was in the hospital recently with chf exacerbation. He is currently on o2. As per staff, he is non compliant with his diet and asking for extra portion. He complaints of dyspnea with rest and exertion. Denies any chest pain or palpitations.   Review of Systems:  Constitutional: positive for fatigue. Negative for fever, chills, diaphoresis.  HENT: Negative for headache, congestion, nasal discharge  Respiratory: Negative for cough, wheezing.   Cardiovascular: Negative for chest pain, palpitations. Positive for orthopnea. Denies PND Gastrointestinal: Negative for heartburn, nausea, vomiting, abdominal pain Genitourinary: Negative for dysuria Musculoskeletal: Negative for back pain, falls Skin: Negative for itching, rash.  Neurological: Negative for dizziness, tingling, focal weakness Psychiatric/Behavioral: Negative for depression   Past Medical History  Diagnosis Date  . CHF (congestive heart failure)   . Atrial fibrillation   . Diabetes mellitus without complication   . Hypertension   . Sleep apnea   . Asthma     Medications: Patient's Medications  New Prescriptions   No medications on file  Previous Medications   ACETAMINOPHEN (TYLENOL) 500 MG TABLET    Take 500 mg by mouth every 4 (four) hours as needed.   ALBUTEROL (PROVENTIL HFA;VENTOLIN HFA) 108 (90 BASE) MCG/ACT INHALER    Inhale 2 puffs into the lungs every 6 (six) hours  as needed for shortness of breath (shortness of breath).   ALBUTEROL (PROVENTIL) (2.5 MG/3ML) 0.083% NEBULIZER SOLUTION    Take 3 mLs (2.5 mg total) by nebulization every 6 (six) hours as needed for wheezing or shortness of breath.   ATORVASTATIN (LIPITOR) 20 MG TABLET    Take 20 mg by mouth at bedtime.    CARVEDILOL (COREG) 3.125 MG TABLET    Take 3.125 mg by mouth 2 (two) times daily with a meal.   CHOLECALCIFEROL (VITAMIN D) 1000 UNITS TABLET    Take 1,000 Units by mouth at bedtime.   GABAPENTIN (NEURONTIN) 300 MG CAPSULE    Take 300 mg by mouth 3 (three) times daily.   HUMALOG KWIKPEN 100 UNIT/ML KIWKPEN    Inject 30 Units into the skin 3 (three) times daily.    INSULIN GLARGINE (LANTUS) 100 UNIT/ML INJECTION    Inject 71 Units into the skin at bedtime.    LISINOPRIL (PRINIVIL,ZESTRIL) 10 MG TABLET    Take 10 mg by mouth daily.   MAGNESIUM 250 MG TABS    2 by mouth daily   MISC. DEVICES (WHEELCHAIR CUSHION) MISC    Use to cushion wheelchair   OMEGA-3 ACID ETHYL ESTERS (LOVAZA) 1 G CAPSULE    Take 2 g by mouth at bedtime.   TORSEMIDE (DEMADEX PO)    Take 40 mg by mouth 2 (two) times daily.   TRANSPARENT DRESSINGS (TEGADERM + PAD 3-1/2"X6") MISC    Apply to wound daily   XARELTO 20 MG TABS TABLET    Take 20 mg by mouth  daily with supper.   Modified Medications   No medications on file  Discontinued Medications   CHLORHEXIDINE (PERIDEX) 0.12 % SOLUTION    Use as directed 15 mLs in the mouth or throat 2 (two) times daily.   FLUTICASONE (FLONASE) 50 MCG/ACT NASAL SPRAY    Place 2 sprays into both nostrils daily.   MAGNESIUM OXIDE (MAG-OX) 400 MG TABLET    Take 400 mg by mouth at bedtime.   SACCHAROMYCES BOULARDII (FLORASTOR) 250 MG CAPSULE    Take 1 capsule (250 mg total) by mouth 2 (two) times daily.   TORSEMIDE (DEMADEX) 20 MG TABLET    Take 2 tablets (40 mg total) by mouth daily.     Physical Exam: Filed Vitals:   02/24/15 1146  BP: 100/58  Pulse: 60  Temp: 99.3 F (37.4 C)    TempSrc: Oral  Resp: 18  Height: 6' (1.829 m)  Weight: 339 lb 3.2 oz (153.86 kg)   Body mass index is 45.99 kg/(m^2).   General- elderly obese male in no acute distress Head- normocephalic, atraumatic Throat- moist mucus membrane Eyes- no pallor, no icterus, no discharge, normal conjunctiva, normal sclera Neck- no cervical lymphadenopathy, no jugular vein distension Cardiovascular- normal s1,s2, no murmurs, palpable dorsalis pedis, 1+ leg edema, JVD + Respiratory- bilateral poor air entry, no wheeze, no rhonchi, no crackles, no use of accessory muscles Abdomen- bowel sounds present, soft, non tender Musculoskeletal- able to move all 4 extremities, generalized weakness Neurological- no focal deficit Skin- warm and dry Psychiatry- alert and oriented to person, place and time, normal mood and affect   Labs reviewed: Basic Metabolic Panel:  Recent Labs  04/54/0902/17/16 0500  01/17/15 0611 01/18/15 0434 01/27/15 1152  NA  --   < > 138 132* 139  K  --   < > 4.1 4.7 4.5  CL  --   < > 96 93* 99  CO2  --   < > 30 29 35*  GLUCOSE  --   < > 106* 229* 92  BUN  --   < > 32* 38* 52*  CREATININE  --   < > 1.63* 1.87* 1.81*  CALCIUM  --   < > 8.7 8.4 9.4  MG 1.9  --   --   --   --   < > = values in this interval not displayed. Liver Function Tests:  Recent Labs  11/09/14 0505 01/27/15 1152  AST 22 14  ALT 18 11  ALKPHOS 57 65  BILITOT 0.8 0.5  PROT 6.4 7.0  ALBUMIN 3.1* 3.2*   No results for input(s): LIPASE, AMYLASE in the last 8760 hours. No results for input(s): AMMONIA in the last 8760 hours. CBC:  Recent Labs  01/11/15 1942  01/17/15 0611 01/18/15 0434 01/27/15 1152  WBC 9.9  < > 9.0 9.9 9.6  NEUTROABS 6.7  --   --   --   --   HGB 8.8*  < > 7.7* 7.5* 8.8 Repeated and verified X2.*  HCT 27.6*  < > 23.9* 23.7* 26.4 Repeated and verified X2.*  MCV 90.2  < > 89.8 89.8 84.8  PLT 185  < > 165 190 276.0  < > = values in this interval not displayed. Cardiac  Enzymes:  Recent Labs  11/08/14 1906 11/08/14 2239 11/09/14 0505  TROPONINI 0.03 0.03 0.03   BNP: Invalid input(s): POCBNP CBG:  Recent Labs  01/18/15 0507 01/18/15 1229 01/18/15 1739  GLUCAP 209* 151* 213*    Assessment/Plan  Weight gain Appears to be mainly from dietary non compliance along with CHF. some signs of increased fluid overload on exam. Has hx of chf, morbid obesity and dm. Dietary compliance reinforced. Currently on torsemide 40 mg bid and bp reading on softer side. Will not increase his diuretics for now. Monitor daily weight first thing in am to get accurate weight. Monitor vitals. As per pt dry weight at home is 332 lbs. Dietary consult to assess for calorie need. Continue o2 and encouraged to work with therapy as tolerated. Pending sleep study. Get cardiology referral.  Morbid obesity Dietary restriction required with his obesity, dm and chf history  CHF This does not appear to be chf exacerbation at present. Continue torsemide 40 mg bid with coreg and lisinopril for now, monitor daily weight. Will get cardiology referral. If weight continues to increase, zaroxylyn might benefit him some.   Dyspnea Has orthopnea and air hunger. Concern of pulmonary HTN contributing to this along with his deconditioning, chf, obesity and sleep apnea/ OHS. Continue o2, bretahing treatment and torsemide for now  Leg edema Advised to wear compression stocking. No signs of infection or dvt. Keep legs elevated at rest.   Oneal Grout, MD  [facility] [phone] (Monday-Friday 8 am - 5 pm) [phone] (afterhours)

## 2015-02-28 ENCOUNTER — Ambulatory Visit: Payer: Medicare Other | Admitting: Internal Medicine

## 2015-03-07 ENCOUNTER — Ambulatory Visit (HOSPITAL_COMMUNITY)
Admission: RE | Admit: 2015-03-07 | Discharge: 2015-03-07 | Disposition: A | Discharge status: Home / Self Care | Payer: No Typology Code available for payment source | Source: Ambulatory Visit | Attending: Internal Medicine | Admitting: Internal Medicine

## 2015-03-07 VITALS — BP 148/62 | HR 93 | Ht 72.0 in | Wt 341.4 lb

## 2015-03-07 DIAGNOSIS — I129 Hypertensive chronic kidney disease with stage 1 through stage 4 chronic kidney disease, or unspecified chronic kidney disease: Secondary | ICD-10-CM | POA: Diagnosis not present

## 2015-03-07 DIAGNOSIS — N189 Chronic kidney disease, unspecified: Secondary | ICD-10-CM | POA: Diagnosis not present

## 2015-03-07 DIAGNOSIS — G4730 Sleep apnea, unspecified: Secondary | ICD-10-CM

## 2015-03-07 DIAGNOSIS — Z8249 Family history of ischemic heart disease and other diseases of the circulatory system: Secondary | ICD-10-CM | POA: Insufficient documentation

## 2015-03-07 DIAGNOSIS — Z7902 Long term (current) use of antithrombotics/antiplatelets: Secondary | ICD-10-CM | POA: Insufficient documentation

## 2015-03-07 DIAGNOSIS — E1122 Type 2 diabetes mellitus with diabetic chronic kidney disease: Secondary | ICD-10-CM | POA: Insufficient documentation

## 2015-03-07 DIAGNOSIS — I5032 Chronic diastolic (congestive) heart failure: Secondary | ICD-10-CM | POA: Diagnosis not present

## 2015-03-07 DIAGNOSIS — I2510 Atherosclerotic heart disease of native coronary artery without angina pectoris: Secondary | ICD-10-CM | POA: Insufficient documentation

## 2015-03-07 DIAGNOSIS — I272 Other secondary pulmonary hypertension: Secondary | ICD-10-CM | POA: Diagnosis not present

## 2015-03-07 DIAGNOSIS — Z79899 Other long term (current) drug therapy: Secondary | ICD-10-CM | POA: Insufficient documentation

## 2015-03-07 DIAGNOSIS — I481 Persistent atrial fibrillation: Secondary | ICD-10-CM | POA: Insufficient documentation

## 2015-03-07 DIAGNOSIS — I4892 Unspecified atrial flutter: Secondary | ICD-10-CM

## 2015-03-07 DIAGNOSIS — Z794 Long term (current) use of insulin: Secondary | ICD-10-CM | POA: Insufficient documentation

## 2015-03-07 DIAGNOSIS — G4733 Obstructive sleep apnea (adult) (pediatric): Secondary | ICD-10-CM

## 2015-03-07 DIAGNOSIS — N183 Chronic kidney disease, stage 3 unspecified: Secondary | ICD-10-CM

## 2015-03-07 LAB — CBC
HCT: 28.6 % — ABNORMAL LOW (ref 39.0–52.0)
Hemoglobin: 8.3 g/dL — ABNORMAL LOW (ref 13.0–17.0)
MCH: 25.5 pg — ABNORMAL LOW (ref 26.0–34.0)
MCHC: 29 g/dL — ABNORMAL LOW (ref 30.0–36.0)
MCV: 88 fL (ref 78.0–100.0)
Platelets: 177 10*3/uL (ref 150–400)
RBC: 3.25 MIL/uL — AB (ref 4.22–5.81)
RDW: 15.7 % — ABNORMAL HIGH (ref 11.5–15.5)
WBC: 7.8 10*3/uL (ref 4.0–10.5)

## 2015-03-07 LAB — BASIC METABOLIC PANEL
Anion gap: 10 (ref 5–15)
BUN: 29 mg/dL — ABNORMAL HIGH (ref 6–20)
CHLORIDE: 87 mmol/L — AB (ref 101–111)
CO2: 37 mmol/L — ABNORMAL HIGH (ref 22–32)
Calcium: 9 mg/dL (ref 8.9–10.3)
Creatinine, Ser: 1.32 mg/dL — ABNORMAL HIGH (ref 0.61–1.24)
GFR calc Af Amer: >60 mL/min (ref >60)
GFR calc non Af Amer: 54 mL/min — ABNORMAL LOW (ref >60)
GLUCOSE: 357 mg/dL — AB (ref 65–99)
POTASSIUM: 4.1 mmol/L (ref 3.5–5.1)
Sodium: 134 mmol/L — ABNORMAL LOW (ref 135–145)

## 2015-03-07 LAB — BRAIN NATRIURETIC PEPTIDE: B Natriuretic Peptide: 222.5 pg/mL — ABNORMAL HIGH (ref 0.0–100.0)

## 2015-03-07 MED ORDER — RIVAROXABAN 15 MG PO TABS: 15.0000 mg | ORAL_TABLET | Freq: Every day | ORAL | Status: DC

## 2015-03-07 MED ORDER — METOLAZONE 2.5 MG PO TABS: 2.5000 mg | ORAL_TABLET | ORAL | Status: DC

## 2015-03-07 MED ORDER — POTASSIUM CHLORIDE CRYS ER 20 MEQ PO TBCR: 20.0000 meq | EXTENDED_RELEASE_TABLET | ORAL | Status: DC

## 2015-03-07 MED ORDER — AMIODARONE HCL 200 MG PO TABS: 200.0000 mg | ORAL_TABLET | Freq: Two times a day (BID) | ORAL | Status: DC

## 2015-03-07 NOTE — Patient Instructions (Signed)
Decrease Xarelto to 15 mg daily  Start Amiodarone 200 mg Twice daily   Start Metolazone 2.5 mg take 30 min prior to AM Torsemide on Wed 6/15 and Thur 6/16, then take every Monday and Friday  Start Potassium 20 meq take every time you take Metolazone  Labs today  Wear Compression Hose every day  You have been referred to Pulmonary for CPAP machine  Labs in 1 week   Your physician recommends that you schedule a follow-up appointment in: 3 weks

## 2015-03-08 DIAGNOSIS — G4733 Obstructive sleep apnea (adult) (pediatric): Secondary | ICD-10-CM | POA: Insufficient documentation

## 2015-03-08 NOTE — Progress Notes (Signed)
Patient ID: Mark Roberson, male   DOB: November 12, 1945, 69 y.o.   MRN: [id number] PCP: Dr. Dorise Hiss  69 yo with history of OSA and suspected OHS on home oxygen, chronic diastolic CHF, atrial fibrillation, and CAD presents for cardiology evaluation.  He was seen by a cardiologist up in [place], but moved earlier this year to [facility].  He had PCI back in the 1990s, no cath since the '90s.  He had a successful TEE-guided DCCV for atrial fibrillation in [place] in 2/16.  Later in 2/16, he moved to [facility].  He was admitted to [facility] with acute on chronic diastolic CHF.  He was back in atrial fibrillation.  He was admitted again in 5/16 with dyspnea, weakness, and edema.  He was diuresed. He had epistaxis and Xarelto was briefly held.  He had tooth abscesses treated.  He remained in atrial fibrillation at the time of that appointment.   Patient is now living at [facility].  He is confined to using a wheelchair or walker.  He wears oxygen at all times.  He can walk short distances without too much trouble as long as he is wearing oxygen.  Dyspnea with longer distances and must use wheelchair.  Legs feel weak.  No chest pain.  He has 3 pillow orthopnea.  Occasional PND episodes.  He is in atrial fibrillation today but does not feel palpitations.  No lightheadedness or syncope.    ECG: atrial fibrillation, poor RWP  Labs (2/16): LDL 64 Labs (5/16): K 4.5, creatinine 1.81, Hgb 8.8  PMH: 1. CKD: Suspect diabetic nephropathy plays a role.  2. Diabetes 3. OSA: Severe on 5/16 sleep study.  Not yet on CPAP.  4. Chronic diastolic CHF: Echo (2/16) with EF 60-65%, mild MR, moderately dilated RV with normal systolic function, PA systolic pressure 50 mmHg.  5. Atrial fibrillation: Now persistent.  He had successful TEE-guided DCCV in 2/16 in [place] but atrial fibrillation recurred not long after.  6. 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  He is on home oxygen.  8. CAD: s/p PCI with DES in the 1990s.  No cardiac cath since 1990s.  9. Anemia 10. H/o  epistaxis 11. S/p L TKR.  SH: Lives at [facility], originally from [place] (moved to [place] in 2016).  Nonsmoker.  No ETOH.   FH: CAD  ROS: All systems reviewed and negative except as per HPI.   Current Outpatient Prescriptions  Medication Sig Dispense Refill  . acetaminophen (TYLENOL) 500 MG tablet Take 500 mg by mouth every 4 (four) hours as needed.    Marland Kitchen albuterol (PROVENTIL HFA;VENTOLIN HFA) 108 (90 BASE) MCG/ACT inhaler Inhale 2 puffs into the lungs every 6 (six) hours as needed for shortness of breath (shortness of breath).    Marland Kitchen albuterol (PROVENTIL) (2.5 MG/3ML) 0.083% nebulizer solution Take 3 mLs (2.5 mg total) by nebulization every 6 (six) hours as needed for wheezing or shortness of breath. 75 mL 1  . atorvastatin (LIPITOR) 20 MG tablet Take 20 mg by mouth at bedtime.     . cholecalciferol (VITAMIN D) 1000 UNITS tablet Take 1,000 Units by mouth at bedtime.    . gabapentin (NEURONTIN) 300 MG capsule Take 300 mg by mouth 3 (three) times daily.    . insulin glargine (LANTUS) 100 UNIT/ML injection Inject 71 Units into the skin at bedtime.     . Magnesium 250 MG TABS 2 by mouth daily    . Misc. Devices [address] [facility] CUSHION) MISC Use to cushion wheelchair 1 each 0  .  omega-3 acid ethyl esters (LOVAZA) 1 G capsule Take 2 g by mouth at bedtime.    . Torsemide (DEMADEX PO) Take 40 mg by mouth 2 (two) times daily.    . Transparent Dressings (TEGADERM + PAD 3-1/2"X6") MISC Apply to wound daily 10 each 0  . amiodarone (PACERONE) 200 MG tablet Take 1 tablet (200 mg total) by mouth 2 (two) times daily. 90 tablet 3  . carvedilol (COREG) 3.125 MG tablet Take 3.125 mg by mouth 2 (two) times daily with a meal.    . HUMALOG KWIKPEN 100 UNIT/ML KiwkPen Inject 30 Units into the skin 3 (three) times daily.   4  . metolazone (ZAROXOLYN) 2.5 MG tablet Take 1 tablet (2.5 mg total) by mouth 2 (two) times a week. Every Monday and Friday 90 tablet 3  . potassium chloride SA (K-DUR,KLOR-CON) 20 MEQ tablet Take 1  tablet (20 mEq total) by mouth 2 (two) times a week. Every Monday and Friday with Metolazone 90 tablet 3  . Rivaroxaban (XARELTO) 15 MG TABS tablet Take 1 tablet (15 mg total) by mouth daily with supper. 30 tablet    No current facility-administered medications for this encounter.   BP 148/62 mmHg  Pulse 93  Ht 6' (1.829 m)  Wt 341 lb 6.4 oz (154.858 kg)  BMI 46.29 kg/m2  SpO2 97% General: NAD, obese Neck: Thick, JVP 10-12 cm, no thyromegaly or thyroid nodule.  Lungs: Slight crackles at bases bilaterally.  CV: Nondisplaced PMI.  Heart irregular S1/S2, no S3/S4, no murmur.  2+ edema to knees bilaterally.  No carotid bruit.  Normal pedal pulses.  Abdomen: Soft, nontender, no hepatosplenomegaly, no distention.  Skin: Intact without lesions or rashes.  Neurologic: Alert and oriented x 3.  Psych: Normal affect. Extremities: No clubbing or cyanosis.  HEENT: Normal.   Assessment/Plan: 1. Chronic diastolic CHF: EF 16-10% on last echo with RV enlargement.  PA pressure elevated by echo.  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 On exam, he is volume overloaded.  He has NYHA class III symptoms.  - Continue torsemide 40 mg bid.  - He will take metolazone 2.5 mg po 30 minutes prior to am torsemide on Wednesday and Thursday this week.  Then next week, he will start metolazone 2.5 mg on Mondays and Fridays.  He will take KCl 20 on metolazone days.  - Check BMET and BNP today.  Will need repeat BMET in 1 week.  - Wear compression stockings during the day.  2. Pulmonary hypertension: Elevated PA pressure on last echo.  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  This will need to be treated by oxygen, CPAP, and diuresis.  3. Atrial fibrillation: This has been persistent,  probably since 2/16.  He has a history of successful DCCV but was unable to hold sinus.   - I will start him on amiodarone 200 mg bid (will need to monitor LFTs, TSH, and get baseline PFTs).  After he has been on amiodarone for about 3 weeks, will re-attempt DCCV.  He has been on Xarelto without missing doses so TEE not required.  - Given low GFR, will decrease Xarelto to 15 mg daily.  CBC today.  4. OSA/suspected OHS: Continue oxygen continuously.  He will need to start on CPAP given severe OSA.  5. CAD: Stable, no  chest pain.  He is on Xarelto with stable CAD so not on aspirin.  Continue statin, good lipids earlier this year.  6. CKD: Follow creatinine closely with diuresis.   Marca Ancona 03/08/2015

## 2015-03-09 NOTE — Telephone Encounter (Signed)
Chart open in error

## 2015-03-21 ENCOUNTER — Ambulatory Visit: Payer: Medicare Other | Admitting: Internal Medicine

## 2015-03-28 ENCOUNTER — Ambulatory Visit (HOSPITAL_COMMUNITY)
Admission: RE | Admit: 2015-03-28 | Discharge: 2015-03-28 | Disposition: A | Discharge status: Home / Self Care | Payer: No Typology Code available for payment source | Source: Ambulatory Visit | Attending: Cardiology | Admitting: Cardiology

## 2015-03-28 ENCOUNTER — Encounter (HOSPITAL_COMMUNITY): Payer: Self-pay

## 2015-03-28 VITALS — BP 110/58 | HR 67 | Wt 328.0 lb

## 2015-03-28 DIAGNOSIS — Z79899 Other long term (current) drug therapy: Secondary | ICD-10-CM | POA: Diagnosis not present

## 2015-03-28 DIAGNOSIS — Z8249 Family history of ischemic heart disease and other diseases of the circulatory system: Secondary | ICD-10-CM | POA: Insufficient documentation

## 2015-03-28 DIAGNOSIS — Z794 Long term (current) use of insulin: Secondary | ICD-10-CM | POA: Diagnosis not present

## 2015-03-28 DIAGNOSIS — I5032 Chronic diastolic (congestive) heart failure: Secondary | ICD-10-CM | POA: Insufficient documentation

## 2015-03-28 DIAGNOSIS — Z96652 Presence of left artificial knee joint: Secondary | ICD-10-CM | POA: Diagnosis not present

## 2015-03-28 DIAGNOSIS — Z9981 Dependence on supplemental oxygen: Secondary | ICD-10-CM | POA: Diagnosis not present

## 2015-03-28 DIAGNOSIS — N189 Chronic kidney disease, unspecified: Secondary | ICD-10-CM | POA: Insufficient documentation

## 2015-03-28 DIAGNOSIS — I129 Hypertensive chronic kidney disease with stage 1 through stage 4 chronic kidney disease, or unspecified chronic kidney disease: Secondary | ICD-10-CM | POA: Diagnosis not present

## 2015-03-28 DIAGNOSIS — G4733 Obstructive sleep apnea (adult) (pediatric): Secondary | ICD-10-CM | POA: Diagnosis not present

## 2015-03-28 DIAGNOSIS — I4891 Unspecified atrial fibrillation: Secondary | ICD-10-CM | POA: Diagnosis not present

## 2015-03-28 DIAGNOSIS — Z66 Do not resuscitate: Secondary | ICD-10-CM | POA: Insufficient documentation

## 2015-03-28 DIAGNOSIS — E1165 Type 2 diabetes mellitus with hyperglycemia: Secondary | ICD-10-CM | POA: Insufficient documentation

## 2015-03-28 DIAGNOSIS — I272 Other secondary pulmonary hypertension: Secondary | ICD-10-CM | POA: Diagnosis not present

## 2015-03-28 DIAGNOSIS — E1122 Type 2 diabetes mellitus with diabetic chronic kidney disease: Secondary | ICD-10-CM | POA: Insufficient documentation

## 2015-03-28 DIAGNOSIS — Z955 Presence of coronary angioplasty implant and graft: Secondary | ICD-10-CM | POA: Diagnosis not present

## 2015-03-28 DIAGNOSIS — I2510 Atherosclerotic heart disease of native coronary artery without angina pectoris: Secondary | ICD-10-CM | POA: Diagnosis not present

## 2015-03-28 LAB — COMPREHENSIVE METABOLIC PANEL
ALBUMIN: 3.2 g/dL — AB (ref 3.5–5.0)
ALK PHOS: 84 U/L (ref 38–126)
ALT: 24 U/L (ref 17–63)
ANION GAP: 13 (ref 5–15)
AST: 28 U/L (ref 15–41)
BUN: 75 mg/dL — AB (ref 6–20)
CO2: 38 mmol/L — AB (ref 22–32)
Calcium: 9.2 mg/dL (ref 8.9–10.3)
Chloride: 84 mmol/L — ABNORMAL LOW (ref 101–111)
Creatinine, Ser: 1.84 mg/dL — ABNORMAL HIGH (ref 0.61–1.24)
GFR calc Af Amer: 42 mL/min — ABNORMAL LOW (ref >60)
GFR calc non Af Amer: 36 mL/min — ABNORMAL LOW (ref >60)
Glucose, Bld: 333 mg/dL — ABNORMAL HIGH (ref 65–99)
POTASSIUM: 3.3 mmol/L — AB (ref 3.5–5.1)
Sodium: 135 mmol/L (ref 135–145)
Total Bilirubin: 0.5 mg/dL (ref 0.3–1.2)
Total Protein: 7.2 g/dL (ref 6.5–8.1)

## 2015-03-28 LAB — BASIC METABOLIC PANEL
BUN: 75 mg/dL — AB (ref 4–21)
CREATININE: 1.8 mg/dL — AB (ref <=1.3)
GLUCOSE: 333 mg/dL
SODIUM: 135 mmol/L — AB (ref 137–147)

## 2015-03-28 LAB — HEPATIC FUNCTION PANEL
ALT: 24 U/L (ref 10–40)
AST: 28 U/L (ref 14–40)
Alkaline Phosphatase: 84 U/L (ref 25–125)
BILIRUBIN, TOTAL: 0.5 mg/dL

## 2015-03-28 NOTE — Progress Notes (Signed)
Patient ID: Mark Roberson, male   DOB: 01/30/1946, 69 y.o.   MRN: [id number] PCP: Dr. 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  He was [name] by a cardiologist [place] in [place], but [place] [facility]  He had PCI back in the 1990s, no cath since the '90s.  He had a successful TEE-guided DCCV for atrial fibrillation in [place] in 2/16.  [place]  [facility] with acute on chronic diastolic CHF.  He was back in atrial fibrillation.  He was admitted again in 5/16 with dyspnea, weakness, and edema.  He was diuresed. He had epistaxis and Xarelto was briefly held.  He had tooth abscesses treated.  He remained in atrial fibrillation at the time of that appointment.   He returns for HF follow up. Last visit metolazone was increased due to volume overload. He was in atrial fibrillation at that appointment.  Amiodarone was started with plan for eventual DCCV. Remains SOB with exertion. He is confined to using a wheelchair or walker.  He wears oxygen at all times.  Able to walk to bathroom. Limited mobility. [facility] pounds.  This has come down since starting metolazone.  No chest pain.  He has 3 pillow orthopnea.  Occasional PND episodes.  One episode of presyncope.   Labs (2/16): LDL 64 Labs (5/16): K 4.5, creatinine 1.81, Hgb 8.8 Labs (6/16): K 4.1 Creatinine 1.32 , HCT 28.6, BNP 223  PMH: 1. CKD: Suspect diabetic nephropathy plays a role.  2. Diabetes 3. OSA: Severe on 5/16 sleep study.  Not yet on CPAP.  4. Chronic diastolic CHF: Echo (2/16) with EF 60-65%, mild MR, moderately dilated RV with normal systolic function, PA systolic pressure 50 mmHg.  5. Atrial fibrillation: Now persistent.  He had successful TEE-guided DCCV in 2/16 in [place] but atrial fibrillation recurred not long after.  6. 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  He is on home oxygen.  8.  CAD: s/p PCI with DES in the 1990s.  No cardiac cath since 1990s.  9. Anemia 10. 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  SH: [facility], [place] from [place] ([place]).  Nonsmoker.  No ETOH.   FH: CAD  ROS: All systems reviewed and negative except as per HPI.   Current Outpatient Prescriptions  Medication Sig Dispense Refill  . albuterol (PROVENTIL HFA;VENTOLIN HFA) 108 (90 BASE) MCG/ACT inhaler Inhale 2 puffs into the lungs every 6 (six) hours as needed for shortness of breath (shortness of breath).    Marland Kitchen. albuterol (PROVENTIL) (2.5 MG/3ML) 0.083% nebulizer solution Take 3 mLs (2.5 mg total) by nebulization every 6 (six) hours as needed for wheezing or shortness of breath. 75 mL 1  . amiodarone (PACERONE) 200 MG tablet Take 1 tablet (200 mg total) by mouth 2 (two) times daily. 90 tablet 3  . atorvastatin (LIPITOR) 20 MG tablet Take 20 mg by mouth at bedtime.     . carvedilol (COREG) 3.125 MG tablet Take 3.125 mg by mouth 2 (two) times daily with a meal.    . cholecalciferol (VITAMIN D) 1000 UNITS tablet Take 1,000 Units by mouth at bedtime.    . gabapentin (NEURONTIN) 300 MG capsule Take 300 mg by mouth 3 (three) times daily.    Marland Kitchen. HUMALOG KWIKPEN 100 UNIT/ML KiwkPen Inject 30 Units into the  skin 3 (three) times daily.   4  . insulin glargine (LANTUS) 100 UNIT/ML injection Inject 71 Units into the skin at bedtime.     . Magnesium 250 MG TABS 2 by mouth daily    . metolazone (ZAROXOLYN) 2.5 MG tablet Take 1 tablet (2.5 mg total) by mouth 2 (two) times a week. Every Monday and Friday 90 tablet 3  . Misc. Devices (WHEELCHAIR CUSHION) MISC Use to cushion wheelchair 1 each 0  . omega-3 acid ethyl esters (LOVAZA) 1 G capsule Take 2 g by mouth at bedtime.    . potassium chloride SA (K-DUR,KLOR-CON) 20 MEQ tablet Take 1 tablet (20 mEq total) by mouth 2 (two) times a week. Every Monday and Friday with Metolazone 90 tablet 3  . Rivaroxaban (XARELTO) 15 MG TABS tablet Take 1 tablet (15 mg  total) by mouth daily with supper. 30 tablet   . Torsemide (DEMADEX PO) Take 40 mg by mouth 2 (two) times daily.    . Transparent Dressings (TEGADERM + PAD 3-1/2"X6") MISC Apply to wound daily 10 each 0  . acetaminophen (TYLENOL) 500 MG tablet Take 500 mg by mouth every 4 (four) hours as needed.     No current facility-administered medications for this encounter.   BP 110/58 mmHg  Pulse 67  SpO2 99% General: NAD, obese. Arrived in wheelchair.  Neck: Thick, JVP 7-8 cm, no thyromegaly or thyroid nodule.  Lungs: Slight crackles at bases bilaterally.  CV: Nondisplaced PMI.  Irregular S1/S2, no S3/S4, no murmur.  2+ edema 1/2 to knees bilaterally.  No carotid bruit.  Normal pedal pulses.  Abdomen: Soft, nontender, no hepatosplenomegaly, no distention.  Skin: Intact without lesions or rashes.  Neurologic: Alert and oriented x 3.  Psych: Normal affect. Extremities: No clubbing or cyanosis.  HEENT: Normal.   EKG: atrial fibrillation with nonspecific T wave inversions  Assessment/Plan: 1. Chronic diastolic CHF: EF 60-65% on last echo with RV enlargement.  PA pressure elevated by echo.  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  He has NYHA class III symptoms. Volume status does look improved compared to prior appointment and weight seems to be down by about 15 lbs.  - Continue torsemide 40 mg bid + metolazone 2.5 mg on Mondays and Fridays.  He will take KCl 20 on metolazone days.  - Check BMET today  - Wear compression stockings during the day.  2. Pulmonary hypertension: Elevated PA pressure on last echo.  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  This will need to be treated by oxygen, CPAP, and diuresis.  3. Atrial fibrillation: This has been persistent, probably  since 2/16.  He has a history of successful DCCV but was unable to hold sinus.   - Continue amiodarone 200 mg bid (check LFTs and TSH today).  He will need at regular eye exams while on amiodarone. - I will set up DC-CV for next week. He has been taking Xarelto 15 mg daily (GFR < 50) long-term.  4. OSA/suspected OHS: Continue oxygen continuously.  He will need to start on CPAP given severe OSA. Referred to pulmonary. Has appointment 05/10/15 with Dr Sood. I will see if I can arrange this for sooner.   5. CAD: Stable, no chest pain.  He is on Xarelto with stable CAD so not on aspirin.    Continue statin, good lipids earlier this year.  6. CKD: Follow creatinine closely with diuresis, BMET today.  7. Uncontrolled DM: Most recent glucose 357 per BMET . Refer to endocrinology.  8. DNR   Follow up 6 weeks  CLEGG,AMY NP-C  03/28/2015   Patient seen with NP, agree with the above note.  He still has significant exertional symptoms though volume does look considerably better with about 15 lb weight loss.  I think that a lot of his symptoms are due to deconditioning (along with CHF and OHS/OSA).  He remains in atrial fibrillation, now on amiodarone.  - Continue current diuretics and check BMET.  - I will arrange for DCCV next week.  He has been on Xarelto without missing doses for months.  - I will see if I can expedite him getting CPAP.  - Endocrine referral for uncontrolled DM.   Marca AnconaDalton Shiah Berhow 03/28/2015

## 2015-03-28 NOTE — Patient Instructions (Addendum)
Will refer you to [facility]. Address: [address], [place], [place] [address]  Phone: [phone]  Routine lab work today. Will notify you of abnormal results, otherwise no news is good news!  You have been scheduled for a Cardioversion. See instruction sheet for details.  Follow up 6 weeks.  Do the following things EVERYDAY: 1) Weigh yourself in the morning before breakfast. Write it down and keep it in a log. 2) Take your medicines as prescribed 3) Eat low salt foods-Limit salt (sodium) to 2000 mg per day.  4) Stay as active as you can everyday 5) Limit all fluids for the day to less than 2 liters

## 2015-03-29 ENCOUNTER — Telehealth (HOSPITAL_COMMUNITY): Payer: Self-pay | Admitting: Cardiology

## 2015-03-29 ENCOUNTER — Encounter: Payer: Self-pay | Admitting: Endocrinology

## 2015-03-29 ENCOUNTER — Ambulatory Visit (INDEPENDENT_AMBULATORY_CARE_PROVIDER_SITE_OTHER): Payer: Medicare Other | Admitting: Endocrinology

## 2015-03-29 VITALS — BP 120/78 | HR 86 | Temp 97.2°F | Resp 18 | Ht 72.0 in | Wt 324.0 lb

## 2015-03-29 DIAGNOSIS — E1122 Type 2 diabetes mellitus with diabetic chronic kidney disease: Secondary | ICD-10-CM | POA: Diagnosis not present

## 2015-03-29 DIAGNOSIS — N189 Chronic kidney disease, unspecified: Secondary | ICD-10-CM

## 2015-03-29 LAB — POCT GLYCOSYLATED HEMOGLOBIN (HGB A1C): Hemoglobin A1C: 7.4

## 2015-03-29 NOTE — Telephone Encounter (Signed)
Pt scheduled to have DCCV on 04/03/15 with Dr.Bensimhon Cpt code [id number] does not require pre cert with pts current insurance Medicare/[employer]

## 2015-03-29 NOTE — Progress Notes (Signed)
Subjective:    Patient ID: Mark Roberson, male    DOB: 1945-12-05, 69 y.o.   MRN: [id number]  HPI pt states DM was dx'ed in 1996; he has severe neuropathy of the lower extremities, and associated renal insufficiency; he has been on insulin since 1998; pt says his diet is good, but exercise is severely limited by health problems; he has never had pancreatitis, severe hypoglycemia or DKA.  Pt says he has mild hypoglycemia in the middle of the night, approx twice per month.  He takes lantus 71 units qhs (but only 50 units 3-4 times per month).  He takes novolog 30 units qac only if cbg is over 200.  He averages approx 2 doses of novolog per day, usually lunch and supper.  Past Medical History  Diagnosis Date  . CHF (congestive heart failure)   . Atrial fibrillation   . Diabetes mellitus without complication   . Hypertension   . Sleep apnea   . Asthma     Past Surgical History  Procedure Laterality Date  . 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 Wound debridement Right   . Multiple extractions with alveoloplasty N/A 01/17/2015    Procedure: Extraction of tooth #'s 14,15, 16 with alveolopalsty;  Surgeon: Charlynne Pander, DDS;  Location: [facility] OR;  Service: Oral Surgery;  Laterality: N/A;    History   Social History  . Marital Status: Single    Spouse Name: N/A  . Number of Children: N/A  . Years of Education: N/A   Occupational History  . Not on file.   Social History Main Topics  . Smoking status: Never Smoker   . Smokeless tobacco: Never Used  . Alcohol Use: Yes     Comment: rare  . Drug Use: No  . Sexual Activity: Not on file   Other Topics Concern  . Not on file   Social History Narrative    Current Outpatient Prescriptions on File Prior to Visit  Medication Sig Dispense Refill  . acetaminophen (TYLENOL) 500 MG tablet Take 500 mg by mouth every 4 (four) hours as needed.    Marland Kitchen albuterol (PROVENTIL HFA;VENTOLIN HFA) 108 (90 BASE) MCG/ACT inhaler Inhale 2 puffs into  the lungs every 6 (six) hours as needed for shortness of breath (shortness of breath).    Marland Kitchen albuterol (PROVENTIL) (2.5 MG/3ML) 0.083% nebulizer solution Take 3 mLs (2.5 mg total) by nebulization every 6 (six) hours as needed for wheezing or shortness of breath. 75 mL 1  . amiodarone (PACERONE) 200 MG tablet Take 1 tablet (200 mg total) by mouth 2 (two) times daily. 90 tablet 3  . atorvastatin (LIPITOR) 20 MG tablet Take 20 mg by mouth at bedtime.     . carvedilol (COREG) 3.125 MG tablet Take 3.125 mg by mouth 2 (two) times daily with a meal.    . cholecalciferol (VITAMIN D) 1000 UNITS tablet Take 1,000 Units by mouth at bedtime.    . gabapentin (NEURONTIN) 300 MG capsule Take 300 mg by mouth 3 (three) times daily.    Marland Kitchen HUMALOG KWIKPEN 100 UNIT/ML KiwkPen Inject 30 Units into the skin 3 (three) times daily.   4  . insulin glargine (LANTUS) 100 UNIT/ML injection Inject 50 Units into the skin at bedtime.     . Magnesium 250 MG TABS 2 by mouth daily    . metolazone (ZAROXOLYN) 2.5 MG tablet Take 1 tablet (2.5 mg total) by mouth 2 (two) times a week. Every Monday  and Friday 90 tablet 3  . Misc. Devices (WHEELCHAIR CUSHION) MISC Use to cushion wheelchair 1 each 0  . omega-3 acid ethyl esters (LOVAZA) 1 G capsule Take 2 g by mouth at bedtime.    . potassium chloride SA (K-DUR,KLOR-CON) 20 MEQ tablet Take 1 tablet (20 mEq total) by mouth 2 (two) times a week. Every Monday and Friday with Metolazone 90 tablet 3  . Rivaroxaban (XARELTO) 15 MG TABS tablet Take 1 tablet (15 mg total) by mouth daily with supper. 30 tablet   . Torsemide (DEMADEX PO) Take 40 mg by mouth 2 (two) times daily.    . Transparent Dressings (TEGADERM + PAD 3-1/2"X6") MISC Apply to wound daily 10 each 0   No current facility-administered medications on file prior to visit.    Allergies  Allergen Reactions  . Percocet [Oxycodone-Acetaminophen] Other (See Comments)    hallucination  . Penicillins Hives    Family History  Problem  Relation Age of Onset  . COPD Mother   . Heart disease Father   . Cancer Sister     breast  . Heart disease Brother   . Diabetes Neg Hx     BP 120/78 mmHg  Pulse 86  Temp(Src) 97.2 F (36.2 C) (Oral)  Resp 18  Ht 6' (1.829 m)  Wt 324 lb (146.965 kg)  BMI 43.93 kg/m2  SpO2 99%  Review of Systems denies weight loss, blurry vision, headache, chest pain, n/v, muscle cramps, excessive diaphoresis, memory loss, cold intolerance, and rhinorrhea.  He attributes easy bruising to xarelto.   He has chronic sob and urinary frequency.      Objective:   Physical Exam VS: see vs page GEN: no distress.  Morbid obesity.  Has 02 on.  In wheelchair.    HEAD: head: no deformity eyes: no periorbital swelling, no proptosis external nose and ears are normal mouth: no lesion seen NECK: 2 healed scars are present (anterior mediastinoscopy) and posterior (c-spine procedure)).  I do not appreciate a nodule in the thyroid or elsewhere in the neck CHEST WALL: no deformity LUNGS: clear to auscultation, except for rales at the bases BREASTS:  No gynecomastia CV: reg rate and rhythm; soft systolic murmur ABD: abdomen is soft, nontender.  no hepatosplenomegaly.  not distended.  no hernia MUSCULOSKELETAL: muscle bulk and strength are grossly normal.  no obvious joint swelling.  gait is normal and steady EXTEMITIES: no deformity.  no ulcer on the feet.  feet are of normal color and temp.  1+ bilat leg edema.  There is bilateral onychomycosis of the toenails.  PULSES: dorsalis pedis intact bilat.  no carotid bruit NEURO:  cn 2-12 grossly intact.   readily moves all 4's.  sensation is intact to touch on the feet, but decreased from normal.   SKIN:  Normal texture and temperature.  No rash or suspicious lesion is visible.   NODES:  None palpable at the neck PSYCH: alert, well-oriented.  Does not appear anxious nor depressed.   A1c=7.4  Our office has called [facility] and obtained cbg record.  i reviewed:  it varies from 70-300's.  It is in general higher as the day goes on.    Lab Results  Component Value Date   CHOL 113 11/14/2014   HDL 30.10* 11/14/2014   LDLCALC 64 11/14/2014   TRIG 97.0 11/14/2014   CHOLHDL 4 11/14/2014      Assessment & Plan:  DM: new to me: The pattern of his cbg's indicates he needs  some adjustment in his therapy. Urinary frequency: due to diuretic rx, rather than DM.  Please continue the same medications.  Dyslipidemia: well-controlled.  Please continue the same medication.    Patient is advised the following: Patient Instructions  good diet and exercise significantly improve the control of your diabetes.  please let me know if you wish to be referred to a dietician.  high blood sugar is very risky to your health.  you should see an eye doctor and dentist every year.  It is very important to get all recommended vaccinations.  controlling your blood pressure and cholesterol drastically reduces the damage diabetes does to your body.  Those who smoke should quit.  please discuss these with your doctor.  check your blood sugar 4 times a day: before the 3 meals, and at bedtime.  also check if you have symptoms of your blood sugar being too high or too low.  please keep a record of the readings and bring it to your next appointment here.  You can write it on any piece of paper.  please call us sooner if your blood sugar goes below 70, or if you have a lot of readings over 200.  Please reduce lantus to 50 units qhs, and: Take novolog 30 units 3 times a day (just before each meal). Please take these insulin doses, no matter what cbg is. Please come back for a follow-up appointment in 3 months Please fax cbg record next week.

## 2015-03-29 NOTE — Patient Instructions (Addendum)
good diet and exercise significantly improve the control of your diabetes.  please let me know if you wish to be referred to a dietician.  high blood sugar is very risky to your health.  you should see an eye doctor and dentist every year.  It is very important to get all recommended vaccinations.  controlling your blood pressure and cholesterol drastically reduces the damage diabetes does to your body.  Those who smoke should quit.  please discuss these with your doctor.  check your blood sugar 4 times a day: before the 3 meals, and at bedtime.  also check if you have symptoms of your blood sugar being too high or too low.  please keep a record of the readings and bring it to your next appointment here.  You can write it on any piece of paper.  please call us sooner if your blood sugar goes below 70, or if you have a lot of readings over 200.  Please reduce lantus to 50 units qhs, and: Take novolog 30 units 3 times a day (just before each meal). Please take these insulin doses, no matter what cbg is. Please come back for a follow-up appointment in 3 months Please fax cbg record next week.

## 2015-03-30 ENCOUNTER — Ambulatory Visit: Payer: Self-pay | Admitting: Endocrinology

## 2015-03-31 ENCOUNTER — Encounter: Payer: Self-pay | Admitting: Adult Health

## 2015-03-31 ENCOUNTER — Telehealth (HOSPITAL_COMMUNITY): Payer: Self-pay | Admitting: *Deleted

## 2015-03-31 ENCOUNTER — Non-Acute Institutional Stay (SKILLED_NURSING_FACILITY): Payer: Medicare Other | Admitting: Adult Health

## 2015-03-31 DIAGNOSIS — E785 Hyperlipidemia, unspecified: Secondary | ICD-10-CM

## 2015-03-31 DIAGNOSIS — D638 Anemia in other chronic diseases classified elsewhere: Secondary | ICD-10-CM | POA: Diagnosis not present

## 2015-03-31 DIAGNOSIS — N189 Chronic kidney disease, unspecified: Secondary | ICD-10-CM | POA: Diagnosis not present

## 2015-03-31 DIAGNOSIS — N183 Chronic kidney disease, stage 3 unspecified: Secondary | ICD-10-CM

## 2015-03-31 DIAGNOSIS — R5381 Other malaise: Secondary | ICD-10-CM | POA: Diagnosis not present

## 2015-03-31 DIAGNOSIS — E1122 Type 2 diabetes mellitus with diabetic chronic kidney disease: Secondary | ICD-10-CM

## 2015-03-31 DIAGNOSIS — E876 Hypokalemia: Secondary | ICD-10-CM

## 2015-03-31 DIAGNOSIS — I480 Paroxysmal atrial fibrillation: Secondary | ICD-10-CM | POA: Diagnosis not present

## 2015-03-31 DIAGNOSIS — G629 Polyneuropathy, unspecified: Secondary | ICD-10-CM | POA: Diagnosis not present

## 2015-03-31 DIAGNOSIS — I5032 Chronic diastolic (congestive) heart failure: Secondary | ICD-10-CM | POA: Diagnosis not present

## 2015-03-31 NOTE — Progress Notes (Signed)
Patient ID: Mark Roberson, male   DOB: [birth year], 69 y.o.   MRN: [id number]   03/31/15  Facility:  Nursing Home Location:  [facility] Room Number: 508-P LEVEL OF CARE:  SNF (31)   Chief Complaint  Patient presents with  . Medical Management of Chronic Issues    Physical deconditioning, chronic diastolic CHF, diabetes mellitus type 2, anemia, atrial fibrillation, hypokalemia, neuropathy and hyperlipidemia    HISTORY OF PRESENT ILLNESS:  This is a 69 year old male who is being seen for routine visit. [facility]. He was [facility] with principal problem of acute on chronic diastolic CHF.  He has past medical history of chronic diastolic CHF, atrial fibrillation and diabetes mellitus, CKD, OSA, CAD, Anemia and S/P left TKR.  He is for cardioversion next week. It was noted that he has been refusing to take his Coreg and would not give no reasoning for it. Charge nurse will notify cardiology. Patient is seen in his room while sitting on a wheelchair while doing leg exercises with restorative. He has oxygen via [place]. No SOB noted. Patient reports keeping it on @ all times and he has not had SOB.   PAST MEDICAL HISTORY:  Past Medical History  Diagnosis Date  . CHF (congestive heart failure)   . Atrial fibrillation   . Diabetes mellitus without complication   . Hypertension   . Sleep apnea   . Asthma     CURRENT MEDICATIONS: Reviewed per MAR/see medication list  Allergies  Allergen Reactions  . Percocet [Oxycodone-Acetaminophen] Other (See Comments)    hallucination  . Penicillins Hives    REVIEW OF SYSTEMS:  GENERAL: no change in appetite, no fatigue, no weight changes, no fever, chills or weakness RESPIRATORY: no cough, SOB, DOE, wheezing, hemoptysis CARDIAC: no chest pain,  or palpitations GI: no abdominal pain, diarrhea, constipation, heart burn, nausea or  vomiting  PHYSICAL EXAMINATION  GENERAL: no acute distress, obese NECK: supple, trachea midline, no neck masses, no thyroid tenderness, no thyromegaly LYMPHATICS: no LAN in the neck, no supraclavicular LAN RESPIRATORY: breathing is even & unlabored, BS CTAB CARDIAC: RRR, no murmur,no extra heart sounds, BLE edema 2+ GI: abdomen soft, normal BS, no masses, no tenderness, no hepatomegaly, no splenomegaly EXTREMITIES: Able to move 4 extremities PSYCHIATRIC: the patient is alert & oriented to person, affect & behavior appropriate  LABS/RADIOLOGY: Labs reviewed: 03/29/15  sodium 137 potassium 3.6 glucose 145 BUN 73 creatinine 1.62 calcium 9.0 CO2 35 03/25/15   sodium 135 potassium 2.3 CO2 38 glucose 333 BUN 75 creatinine 1.84 calcium 9.2 total protein 7.2 albumin 3.2 AST 28 ALT 24 alkaline phosphatase 84 total bilirubin 0.5 03/22/15  sodium 140 potassium 3.3 glucose 195 BUN 66 creatinine 1.54 CO2 42 calcium 8.9 03/15/15  sodium 139 potassium 3.5 glucose 158 CO2 45 BUN 50 creatinine 1.59 calcium 9.3 03/07/15  sodium 134 potassium 4.1 glucose 357 BUN 29 creatinine 1.32 calcium 9.0 02/28/15  sodium 141 potassium 3.8 CO2 39 glucose 163 BUN 39 creatinine 1.31 calcium 8.6 02/22/15  sodium 140 potassium 3.9 glucose 144 CO2 32 BUN 46 creatinine 1.31 calcium 8.7 02/03/15  TSH 4.572 WBC hemoglobin 8.9 hematocrit 28.2 MCV 85.2 CO2 31 glucose 171 BUN 39 creatinine 1.60 calcium 9.0 hemoglobin A1c 7.9 Basic Metabolic Panel:  Recent Labs  40/98/1102/17/16 0500  01/27/15 1152 03/07/15 1645 03/28/15 1515  NA  --   < > 139 134*  135  K  --   < > 4.5 4.1 3.3*  CL  --   < > 99 87* 84*  CO2  --   < > 35* 37* 38*  GLUCOSE  --   < > 92 357* 333*  BUN  --   < > 52* 29* 75*  CREATININE  --   < > 1.81* 1.32* 1.84*  CALCIUM  --   < > 9.4 9.0 9.2  MG 1.9  --   --   --   --   < > = values in this interval not displayed. Liver Function Tests:  Recent Labs  11/09/14 0505 01/27/15 1152 03/28/15 1515  AST ALT  ALKPHOS 57 65 84  BILITOT 0.8 0.5 0.5  PROT 6.4 7.0 7.2  ALBUMIN 3.1* 3.2* 3.2*   CBC:  Recent Labs  01/11/15 1942  01/18/15 0434 01/27/15 1152 03/07/15 1645  WBC 9.9  < > 9.9 9.6 7.8  NEUTROABS 6.7  --   --   --   --   HGB 8.8*  < > 7.5* 8.8 Repeated and verified X2.* 8.3*  HCT 27.6*  < > 23.7* 26.4 Repeated and verified X2.* 28.6*  MCV 90.2  < > 89.8 84.8 88.0  PLT 185  < > 190 276.0 177  < > = values in this interval not displayed.  Lipid Panel:  Recent Labs  11/14/14 1717  HDL 30.10*   Cardiac Enzymes:  Recent Labs  11/08/14 1906 11/08/14 2239 11/09/14 0505  TROPONINI 0.03 0.03 0.03   CBG:  Recent Labs  01/18/15 0507 01/18/15 1229 01/18/15 1739  GLUCAP 209* 151* 213*     ASSESSMENT/PLAN:  Physical deconditioning - for restorative rehabilitation  Chronic diastolic CHF - continue torsemide 40 mg by mouth BID, Metolazone 2.5 mg 1 tab PO Q Mondays and Coreg 3.125 mg by mouth twice a day, O2 at 2 L/minute, daily weights,  Neuropathy - continue Neurontin 300 mg by mouth 3 times a day  Hyperlipidemia - continue Lipitor 20 mg by mouth daily at bedtime  Atrial Fibrillation - rate controlled; continue Xarelto 15 mg by mouth daily, Coreg 3.125 mg by mouth twice a day and recently started on Amiodarone 200 mg 1 tab PO BID  Diabetes mellitus, type II with renal complications - hemoglobin A1c 7.9; recently decreased Lantus 50 units subcutaneous daily at bedtime and Humalog 30 units subcutaneous 3 times a day before meals  Anemia of chronic disease - hemoglobin 8.3; check CBC  Chronic kidney disease stage III-  Creatinine 1.62; will monitor  Hypokalemia - K 3.6; continue K-dur 20 meq 1 tab PO Q Monday and Friday   Goals of care:  Short-term rehabilitation   [facility], NP [facility] [phone]

## 2015-03-31 NOTE — Telephone Encounter (Signed)
[facility] regarding pt's Carvedilol, she states that pt refuses to take the medication, the last time he had it was 7/1 AM dose but he has refused both AM and PM dosing since that time and will not give a reason why.  She states if they put it in the cup with his other pills he pulls it out and will not take it.  Will send to Dr Shirlee LatchMcLean to let him know

## 2015-04-03 ENCOUNTER — Ambulatory Visit (HOSPITAL_COMMUNITY): Payer: No Typology Code available for payment source | Admitting: Anesthesiology

## 2015-04-03 ENCOUNTER — Encounter (HOSPITAL_COMMUNITY): Payer: Self-pay | Admitting: Anesthesiology

## 2015-04-03 ENCOUNTER — Ambulatory Visit (HOSPITAL_COMMUNITY)
Admission: RE | Admit: 2015-04-03 | Discharge: 2015-04-03 | Disposition: A | Discharge status: Home / Self Care | Payer: No Typology Code available for payment source | Source: Ambulatory Visit | Attending: Cardiology | Admitting: Cardiology

## 2015-04-03 ENCOUNTER — Encounter (HOSPITAL_COMMUNITY)
Admission: RE | Disposition: A | Discharge status: Home / Self Care | Payer: Self-pay | Source: Ambulatory Visit | Attending: Cardiology

## 2015-04-03 DIAGNOSIS — E1122 Type 2 diabetes mellitus with diabetic chronic kidney disease: Secondary | ICD-10-CM | POA: Diagnosis not present

## 2015-04-03 DIAGNOSIS — I5032 Chronic diastolic (congestive) heart failure: Secondary | ICD-10-CM | POA: Insufficient documentation

## 2015-04-03 DIAGNOSIS — G4733 Obstructive sleep apnea (adult) (pediatric): Secondary | ICD-10-CM | POA: Diagnosis not present

## 2015-04-03 DIAGNOSIS — Z951 Presence of aortocoronary bypass graft: Secondary | ICD-10-CM | POA: Insufficient documentation

## 2015-04-03 DIAGNOSIS — I4891 Unspecified atrial fibrillation: Secondary | ICD-10-CM | POA: Diagnosis not present

## 2015-04-03 DIAGNOSIS — Z66 Do not resuscitate: Secondary | ICD-10-CM | POA: Insufficient documentation

## 2015-04-03 DIAGNOSIS — I2510 Atherosclerotic heart disease of native coronary artery without angina pectoris: Secondary | ICD-10-CM | POA: Diagnosis not present

## 2015-04-03 DIAGNOSIS — I509 Heart failure, unspecified: Secondary | ICD-10-CM | POA: Insufficient documentation

## 2015-04-03 DIAGNOSIS — Z7901 Long term (current) use of anticoagulants: Secondary | ICD-10-CM | POA: Insufficient documentation

## 2015-04-03 DIAGNOSIS — Z9981 Dependence on supplemental oxygen: Secondary | ICD-10-CM | POA: Diagnosis not present

## 2015-04-03 DIAGNOSIS — Z96652 Presence of left artificial knee joint: Secondary | ICD-10-CM | POA: Insufficient documentation

## 2015-04-03 DIAGNOSIS — I129 Hypertensive chronic kidney disease with stage 1 through stage 4 chronic kidney disease, or unspecified chronic kidney disease: Secondary | ICD-10-CM | POA: Insufficient documentation

## 2015-04-03 DIAGNOSIS — Z8249 Family history of ischemic heart disease and other diseases of the circulatory system: Secondary | ICD-10-CM | POA: Diagnosis not present

## 2015-04-03 DIAGNOSIS — E1165 Type 2 diabetes mellitus with hyperglycemia: Secondary | ICD-10-CM | POA: Insufficient documentation

## 2015-04-03 DIAGNOSIS — N189 Chronic kidney disease, unspecified: Secondary | ICD-10-CM | POA: Insufficient documentation

## 2015-04-03 DIAGNOSIS — J449 Chronic obstructive pulmonary disease, unspecified: Secondary | ICD-10-CM | POA: Diagnosis not present

## 2015-04-03 DIAGNOSIS — I272 Other secondary pulmonary hypertension: Secondary | ICD-10-CM | POA: Diagnosis not present

## 2015-04-03 HISTORY — PX: CARDIOVERSION: SHX1299

## 2015-04-03 LAB — CBC AND DIFFERENTIAL
HCT: 26 % — AB (ref 41–53)
Hemoglobin: 8 g/dL — AB (ref 13.5–17.5)
PLATELETS: 199 10*3/uL (ref 150–399)
WBC: 9.8 10*3/mL

## 2015-04-03 LAB — GLUCOSE, CAPILLARY: Glucose-Capillary: 160 mg/dL — ABNORMAL HIGH (ref 65–99)

## 2015-04-03 SURGERY — CARDIOVERSION
Anesthesia: General

## 2015-04-03 MED ORDER — LIDOCAINE HCL (CARDIAC) 20 MG/ML IV SOLN
INTRAVENOUS | Status: DC | PRN
  Administered 2015-04-03: 50 mg via INTRAVENOUS

## 2015-04-03 MED ORDER — SODIUM CHLORIDE 0.9 % IV SOLN: 250.0000 mL | INTRAVENOUS | Status: DC

## 2015-04-03 MED ORDER — SODIUM CHLORIDE 0.9 % IJ SOLN: 3.0000 mL | INTRAMUSCULAR | Status: DC | PRN

## 2015-04-03 MED ORDER — SODIUM CHLORIDE 0.9 % IJ SOLN: 3.0000 mL | Freq: Two times a day (BID) | INTRAMUSCULAR | Status: DC

## 2015-04-03 MED ORDER — SODIUM CHLORIDE 0.9 % IV SOLN
INTRAVENOUS | Status: DC
  Administered 2015-04-03 (×2): via INTRAVENOUS

## 2015-04-03 MED ORDER — METOLAZONE 2.5 MG PO TABS: 2.5000 mg | ORAL_TABLET | ORAL | Status: DC

## 2015-04-03 MED ORDER — HYDROCORTISONE 1 % EX CREA
1.0000 "application " | TOPICAL_CREAM | Freq: Three times a day (TID) | CUTANEOUS | Status: DC | PRN
  Filled 2015-04-03: qty 28

## 2015-04-03 MED ORDER — PROPOFOL 10 MG/ML IV BOLUS
INTRAVENOUS | Status: DC | PRN
  Administered 2015-04-03: 110 mg via INTRAVENOUS

## 2015-04-03 NOTE — Interval H&P Note (Signed)
History and Physical Interval Note:  04/03/2015 12:16 PM  Mark GoldsMichael Pippenger  has presented today for surgery, with the diagnosis of afib  The various methods of treatment have been discussed with the patient and family. After consideration of risks, benefits and other options for treatment, the patient has consented to  Procedure(s): CARDIOVERSION (N/A) as a surgical intervention .  The patient's history has been reviewed, patient examined, no change in status, stable for surgery.  I have reviewed the patient's chart and labs.  Questions were answered to the patient's satisfaction.     Leitha Hyppolite [employer]

## 2015-04-03 NOTE — Anesthesia Postprocedure Evaluation (Signed)
  Anesthesia Post-op Note  Patient: Macario GoldsMichael Gaultney  Procedure(s) Performed: Procedure(s): CARDIOVERSION (N/A)  Patient Location: Endoscopy Unit  Anesthesia Type:General  Level of Consciousness: awake, alert , oriented and patient cooperative  Airway and Oxygen Therapy: Patient Spontanous Breathing and Patient connected to nasal cannula oxygen  Post-op Pain: none  Post-op Assessment: Post-op Vital signs reviewed, Patient's Cardiovascular Status Stable, Respiratory Function Stable, Patent Airway, No signs of Nausea or vomiting, Adequate PO intake, Pain level controlled and No headache              Post-op Vital Signs: Reviewed and stable  Last Vitals:  Filed Vitals:   04/03/15 1109  BP: 130/41  Pulse: 55  Temp: 36.9 C  Resp: 16    Complications: No apparent anesthesia complications

## 2015-04-03 NOTE — H&P (View-Only) (Signed)
Patient ID: Mark Roberson, male   DOB: 01/30/1946, 69 y.o.   MRN: [id number] PCP: Dr. 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  He was [place], [place]  He had PCI back in the 1990s, no cath since the '90s.  He had a successful TEE-guided DCCV [place] atrial [place] in 2[place]16.  [place]  [facility] with acute on chronic diastolic CHF.  He was back in atrial fibrillation.  He was admitted again in 5/16 with dyspnea, weakness, and edema.  He was diuresed. He had epistaxis and Xarelto was briefly held.  He had tooth abscesses treated.  He remained in atrial fibrillation at the time of that appointment.   He returns for HF follow up. Last visit metolazone was increased due to volume overload. He was in atrial fibrillation at that appointment.  Amiodarone was started with plan for eventual DCCV. Remains SOB with exertion. He is confined to using a wheelchair or walker.  He wears oxygen at all times.  Able to walk to bathroom. Limited mobility. [facility].  This has come down since starting metolazone.  No chest pain.  He has 3 pillow orthopnea.  Occasional PND episodes.  One episode of presyncope.   Labs (2/16): LDL 64 Labs (5/16): K 4.5, creatinine 1.81, Hgb 8.8 Labs (6/16): K 4.1 Creatinine 1.32 , HCT 28.6, BNP 223  PMH: 1. CKD: Suspect diabetic nephropathy plays a role.  2. Diabetes 3. OSA: Severe on 5/16 sleep study.  Not yet on CPAP.  4. Chronic diastolic CHF: Echo (2/16) with EF 60-65%, mild MR, moderately dilated RV with normal systolic function, PA systolic pressure 50 mmHg.  5. Atrial fibrillation: Now persistent.  He had successful TEE-guided DCCV in [place] but atrial fibrillation recurred not long after.  6. 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  He is on home oxygen.  8.  CAD: s/p PCI with DES in the 1990s.  No cardiac cath since 1990s.  9. Anemia 10. 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  SH: [facility], [place] from [place]  Nonsmoker.  No ETOH.   FH: CAD  ROS: All systems reviewed and negative except as per HPI.   Current Outpatient Prescriptions  Medication Sig Dispense Refill  . albuterol (PROVENTIL HFA;VENTOLIN HFA) 108 (90 BASE) MCG/ACT inhaler Inhale 2 puffs into the lungs every 6 (six) hours as needed for shortness of breath (shortness of breath).    Marland Kitchen. albuterol (PROVENTIL) (2.5 MG/3ML) 0.083% nebulizer solution Take 3 mLs (2.5 mg total) by nebulization every 6 (six) hours as needed for wheezing or shortness of breath. 75 mL 1  . amiodarone (PACERONE) 200 MG tablet Take 1 tablet (200 mg total) by mouth 2 (two) times daily. 90 tablet 3  . atorvastatin (LIPITOR) 20 MG tablet Take 20 mg by mouth at bedtime.     . carvedilol (COREG) 3.125 MG tablet Take 3.125 mg by mouth 2 (two) times daily with a meal.    . cholecalciferol (VITAMIN D) 1000 UNITS tablet Take 1,000 Units by mouth at bedtime.    . gabapentin (NEURONTIN) 300 MG capsule Take 300 mg by mouth 3 (three) times daily.    Marland Kitchen. HUMALOG KWIKPEN 100 UNIT/ML KiwkPen Inject 30 Units into the  skin 3 (three) times daily.   4  . insulin glargine (LANTUS) 100 UNIT/ML injection Inject 71 Units into the skin at bedtime.     . Magnesium 250 MG TABS 2 by mouth daily    . metolazone (ZAROXOLYN) 2.5 MG tablet Take 1 tablet (2.5 mg total) by mouth 2 (two) times a week. Every Monday and Friday 90 tablet 3  . Misc. Devices (WHEELCHAIR CUSHION) MISC Use to cushion wheelchair 1 each 0  . omega-3 acid ethyl esters (LOVAZA) 1 G capsule Take 2 g by mouth at bedtime.    . potassium chloride SA (K-DUR,KLOR-CON) 20 MEQ tablet Take 1 tablet (20 mEq total) by mouth 2 (two) times a week. Every Monday and Friday with Metolazone 90 tablet 3  . Rivaroxaban (XARELTO) 15 MG TABS tablet Take 1 tablet (15 mg  total) by mouth daily with supper. 30 tablet   . Torsemide (DEMADEX PO) Take 40 mg by mouth 2 (two) times daily.    . Transparent Dressings (TEGADERM + PAD 3-1/2"X6") MISC Apply to wound daily 10 each 0  . acetaminophen (TYLENOL) 500 MG tablet Take 500 mg by mouth every 4 (four) hours as needed.     No current facility-administered medications for this encounter.   BP 110/58 mmHg  Pulse 67  SpO2 99% General: NAD, obese. Arrived in wheelchair.  Neck: Thick, JVP 7-8 cm, no thyromegaly or thyroid nodule.  Lungs: Slight crackles at bases bilaterally.  CV: Nondisplaced PMI.  Irregular S1/S2, no S3/S4, no murmur.  2+ edema 1/2 to knees bilaterally.  No carotid bruit.  Normal pedal pulses.  Abdomen: Soft, nontender, no hepatosplenomegaly, no distention.  Skin: Intact without lesions or rashes.  Neurologic: Alert and oriented x 3.  Psych: Normal affect. Extremities: No clubbing or cyanosis.  HEENT: Normal.   EKG: atrial fibrillation with nonspecific T wave inversions  Assessment/Plan: 1. Chronic diastolic CHF: EF 16-10% on last echo with RV enlargement.  PA pressure elevated by echo.  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  He has NYHA class III symptoms. Volume status does look improved compared to prior appointment and weight seems to be down by about 15 lbs.  - Continue torsemide 40 mg bid + metolazone 2.5 mg on Mondays and Fridays.  He will take KCl 20 on metolazone days.  - Check BMET today  - Wear compression stockings during the day.  2. Pulmonary hypertension: Elevated PA pressure on last echo.  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  This will need to be treated by oxygen, CPAP, and diuresis.  3. Atrial fibrillation: This has been persistent, probably  since 2/16.  He has a history of successful DCCV but was unable to hold sinus.   - Continue amiodarone 200 mg bid (check LFTs and TSH today).  He will need at regular eye exams while on amiodarone. - I will set up DC-CV for next week. He has been taking Xarelto 15 mg daily (GFR < 50) long-term.  4. OSA/suspected OHS: Continue oxygen continuously.  He will need to start on CPAP given severe OSA. Referred to pulmonary. Has appointment 05/10/15 with Dr Craige Cotta. I will see if I can arrange this for sooner.   5. CAD: Stable, no chest pain.  He is on Xarelto with stable CAD so not on aspirin.  Continue statin, good lipids earlier this year.  6. CKD: Follow creatinine closely with diuresis, BMET today.  7. Uncontrolled DM: Most recent glucose 357 per BMET . Refer to endocrinology.  8. DNR   Follow up 6 weeks  CLEGG,AMY NP-C  03/28/2015   Patient seen with NP, agree with the above note.  He still has significant exertional symptoms though volume does look considerably better with about 15 lb weight loss.  I think that a lot of his symptoms are due to deconditioning (along with CHF and OHS/OSA).  He remains in atrial fibrillation, now on amiodarone.  - Continue current diuretics and check BMET.  - I will arrange for DCCV next week.  He has been on Xarelto without missing doses for months.  - I will see if I can expedite him getting CPAP.  - Endocrine referral for uncontrolled DM.   Marca AnconaDalton Julie Paolini 03/28/2015

## 2015-04-03 NOTE — Discharge Instructions (Signed)

## 2015-04-03 NOTE — Telephone Encounter (Signed)
HR is low, let's have him stop Coreg.

## 2015-04-03 NOTE — Anesthesia Preprocedure Evaluation (Addendum)
Anesthesia Evaluation  Patient identified by MRN, date of birth, ID band Patient awake    Reviewed: Allergy & Precautions, NPO status , Patient's Chart, lab work & pertinent test results, reviewed documented beta blocker date and time   History of Anesthesia Complications Negative for: history of anesthetic complications  Airway Mallampati: II   Neck ROM: Full    Dental   Pulmonary asthma , sleep apnea and Oxygen sleep apnea , COPD oxygen dependent,    Pulmonary exam normal       Cardiovascular hypertension, Pt. on medications +CHF Normal cardiovascular exam    Neuro/Psych    GI/Hepatic negative GI ROS, Neg liver ROS,   Endo/Other  diabetes, Poorly Controlled, Type 2  Renal/GU Renal InsufficiencyRenal disease     Musculoskeletal   Abdominal   Peds  Hematology  (+) anemia ,   Anesthesia Other Findings   Reproductive/Obstetrics                            Anesthesia Physical Anesthesia Plan  ASA: III  Anesthesia Plan: General   Post-op Pain Management:    Induction: Intravenous  Airway Management Planned: Mask  Additional Equipment:   Intra-op Plan:   Post-operative Plan:   Informed Consent: I have reviewed the patients History and Physical, chart, labs and discussed the procedure including the risks, benefits and alternatives for the proposed anesthesia with the patient or authorized representative who has indicated his/her understanding and acceptance.     Plan Discussed with: CRNA and Surgeon  Anesthesia Plan Comments:         Anesthesia Quick Evaluation

## 2015-04-03 NOTE — Transfer of Care (Signed)
Immediate Anesthesia Transfer of Care Note  Patient: Mark GoldsMichael Roberson  Procedure(s) Performed: Procedure(s): CARDIOVERSION (N/A)  Patient Location: Endoscopy Unit  Anesthesia Type:General  Level of Consciousness: awake, alert , oriented and patient cooperative  Airway & Oxygen Therapy: Patient Spontanous Breathing and Patient connected to nasal cannula oxygen  Post-op Assessment: Report given to RN and Post -op Vital signs reviewed and stable  Post vital signs: Reviewed and stable  Last Vitals:  Filed Vitals:   04/03/15 1109  BP: 130/41  Pulse: 55  Temp: 36.9 C  Resp: 16    Complications: No apparent anesthesia complications

## 2015-04-03 NOTE — Procedures (Signed)
Electrical Cardioversion Procedure Note Mark GoldsMichael Roberson [id number] 1946-03-07  Procedure: Electrical Cardioversion Indications:  Atrial Fibrillation  Procedure Details Consent: Risks of procedure as well as the alternatives and risks of each were explained to the (patient/caregiver).  Consent for procedure obtained. Time Out: Verified patient identification, verified procedure, site/side was marked, verified correct patient position, special equipment/implants available, medications/allergies/relevent history reviewed, required imaging and test results available.  Performed  Patient placed on cardiac monitor, pulse oximetry, supplemental oxygen as necessary.  Sedation given: Propofol per anesthesiology Pacer pads placed anterior and posterior chest.  Cardioverted 2 time(s), 2nd time used sternal pressure.  Cardioverted at 200J.  Evaluation Findings: Post procedure EKG shows: ?NSR with premature junctional beats.  ECG ordered to assess.  Complications: None Patient did tolerate procedure well.  He will need to increase metolazone back to twice a week, Monday and Friday.    Mark Roberson 04/03/2015, 12:28 PM

## 2015-04-04 ENCOUNTER — Encounter (HOSPITAL_COMMUNITY): Payer: Self-pay | Admitting: Cardiology

## 2015-04-04 NOTE — Addendum Note (Signed)
Addended by: Noralee SpaceSCHUB, Christan Defranco M on: 04/04/2015 11:29 AM   Modules accepted: Orders, Medications

## 2015-04-04 NOTE — Telephone Encounter (Signed)
[facility] fax[facility]

## 2015-04-05 ENCOUNTER — Encounter: Payer: Self-pay | Admitting: Internal Medicine

## 2015-04-06 ENCOUNTER — Other Ambulatory Visit: Payer: Self-pay | Admitting: Internal Medicine

## 2015-04-12 ENCOUNTER — Encounter: Payer: Self-pay | Admitting: Adult Health

## 2015-04-12 ENCOUNTER — Non-Acute Institutional Stay (SKILLED_NURSING_FACILITY): Payer: Medicare Other | Admitting: Adult Health

## 2015-04-12 DIAGNOSIS — I4892 Unspecified atrial flutter: Secondary | ICD-10-CM | POA: Diagnosis not present

## 2015-04-12 DIAGNOSIS — E1122 Type 2 diabetes mellitus with diabetic chronic kidney disease: Secondary | ICD-10-CM | POA: Diagnosis not present

## 2015-04-12 DIAGNOSIS — I5032 Chronic diastolic (congestive) heart failure: Secondary | ICD-10-CM | POA: Diagnosis not present

## 2015-04-12 DIAGNOSIS — G4733 Obstructive sleep apnea (adult) (pediatric): Secondary | ICD-10-CM

## 2015-04-12 DIAGNOSIS — N189 Chronic kidney disease, unspecified: Secondary | ICD-10-CM

## 2015-04-12 NOTE — Progress Notes (Signed)
Patient ID: Mark Roberson, male   DOB: 1946/01/18, 69 y.o.   MRN: [id number]   Facility: [facility]       Allergies  Allergen Reactions  . Percocet [Oxycodone-Acetaminophen] Other (See Comments)    hallucination  . Penicillins Hives    Chief Complaint  Patient presents with  . Discharge Note    Discharge from SNF    HPI:  He had been hospitalized for physical deconditioning and chf. He was admitted to this facility for short term rehab. He is ready to be discharged to home.  He is being discharged to home with home health for pt/rn. He is 02 dependent with 02 sats of 82% on room air. He will need portable 02; 02 concentrator for 2L/[place]. He will need his prescriptions written and will need a follow up with his pcp.    Past Medical History  Diagnosis Date  . CHF (congestive heart failure)   . Atrial fibrillation   . Diabetes mellitus without complication   . Hypertension   . Sleep apnea   . Asthma     Past Surgical History  Procedure Laterality Date  . 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 Wound debridement Right   . Multiple extractions with alveoloplasty N/A 01/17/2015    Procedure: Extraction of tooth #'s 14,15, 16 with alveolopalsty;  Surgeon: Charlynne Pander, DDS;  Location: [facility] OR;  Service: Oral Surgery;  Laterality: N/A;  . Cardioversion N/A 04/03/2015    Procedure: CARDIOVERSION;  Surgeon: Laurey Morale, MD;  Location: [facility] ENDOSCOPY;  Service: Cardiovascular;  Laterality: N/A;    VITAL SIGNS BP 144/45 mmHg  Pulse 56  Temp(Src) 98 F (36.7 C) (Oral)  Resp 18  Ht 6' (1.829 m)  Wt 325 lb (147.419 kg)  BMI 44.07 kg/m2  SpO2 96%  Patient's Medications  New Prescriptions   No medications on file  Previous Medications   ACETAMINOPHEN (TYLENOL) 500 MG TABLET    Take 500 mg by mouth every 4 (four) hours as needed (pain). Do not exceed 4 gms of tylenol in 24 hours   ALBUTEROL (PROVENTIL) (2.5 MG/3ML) 0.083% NEBULIZER SOLUTION    Take 3 mLs (2.5 mg  total) by nebulization every 6 (six) hours as needed for wheezing or shortness of breath.   AMIODARONE (PACERONE) 200 MG TABLET    Take 1 tablet (200 mg total) by mouth 2 (two) times daily.   ATORVASTATIN (LIPITOR) 20 MG TABLET    Take 20 mg by mouth at bedtime.    CARVEDILOL (COREG) 3.125 MG TABLET    Take 3.125 mg by mouth 2 (two) times daily with a meal.   CHOLECALCIFEROL (VITAMIN D) 2000 UNITS TABLET    Take 2,000 Units by mouth daily.   GABAPENTIN (NEURONTIN) 300 MG CAPSULE    Take 300 mg by mouth 3 (three) times daily.   INSULIN ASPART (NOVOLOG FLEXPEN) 100 UNIT/ML FLEXPEN    Inject 30 Units into the skin 3 (three) times daily before meals. Check blood glucose before meals   INSULIN GLARGINE (LANTUS SOLOSTAR) 100 UNIT/ML SOLOSTAR PEN    Inject 71 Units into the skin at bedtime.    MAGNESIUM 250 MG TABS    Take 500 mg by mouth daily. 2 by mouth daily   METOLAZONE (ZAROXOLYN) 2.5 MG TABLET    Take 1 tablet (2.5 mg total) by mouth 2 (two) times a week. Every Monday and Friday   MISC. DEVICES (WHEELCHAIR CUSHION) MISC    Use  to cushion wheelchair   MULTIPLE VITAMINS-MINERALS (DECUBI-VITE) CAPS    Take 1 capsule by mouth daily.   OMEGA 3-6-9 CAPS    Take 2 capsules by mouth daily.   POTASSIUM CHLORIDE SA (K-DUR,KLOR-CON) 20 MEQ TABLET    Take 1 tablet (20 mEq total) by mouth 2 (two) times a week. Every Monday and Friday with Metolazone   PROAIR HFA 108 (90 BASE) MCG/ACT INHALER    INHALE 1 TO 2 PUFFS BY MOUTH FOUR TIMES DAILY AS DIRECTED AS NEEDED FOR WHEEZING AND SHORTNESS OF BREATH   RIVAROXABAN (XARELTO) 15 MG TABS TABLET    Take 1 tablet (15 mg total) by mouth daily with supper.   TORSEMIDE (DEMADEX) 20 MG TABLET    Take 40 mg by mouth 2 (two) times daily.    TRANSPARENT DRESSINGS (TEGADERM + PAD 3-1/2"X6") MISC    Apply to wound daily  Modified Medications   No medications on file  Discontinued Medications   No medications on file     SIGNIFICANT DIAGNOSTIC EXAMS  LABS REVIEWED:    03-31-15: glucose 225; bun 70; creat 1.66; k+3.3; na++138 04-04-15: wbc 9.8; hgb 8.9; hct 25.8; mcv 81.6; plt 199    Review of Systems  Constitutional: Positive for fatigue. Negative for appetite change.  HENT: Negative for congestion.   Respiratory: Positive for shortness of breath. Negative for cough and chest tightness.   Cardiovascular: Negative for chest pain, palpitations and leg swelling.  Gastrointestinal: Negative for nausea, abdominal pain, diarrhea and constipation.  Musculoskeletal: Negative for myalgias and arthralgias.  Skin: Negative for pallor.  Neurological: Negative for dizziness.  Psychiatric/Behavioral: The patient is not nervous/anxious.       Physical Exam  Constitutional: He is oriented to person, place, and time. No distress.  Obese   Eyes: Conjunctivae are normal.  Neck: Neck supple. No JVD present. No thyromegaly present.  Cardiovascular: Normal rate, regular rhythm and intact distal pulses.   Respiratory: Effort normal and breath sounds normal. No respiratory distress. He has no wheezes.  GI: Soft. Bowel sounds are normal. He exhibits no distension. There is no tenderness.  Musculoskeletal: He exhibits edema.  Able to move all extremities   Lymphadenopathy:    He has no cervical adenopathy.  Neurological: He is alert and oriented to person, place, and time.  Skin: Skin is warm and dry. He is not diaphoretic.  Psychiatric: He has a normal mood and affect.       ASSESSMENT/ PLAN:  Will discharge him to home with home health for pt/rn to evaluate and treat as indicated for endurance and disease management. His prescriptions have been written for a 30 day supply of his medications. He will need 02/2L will need a portable 02 concentrator. The facility is to setup a follow up appointment with his pcp within 2 weeks of his discharge.   Time spent with patient  40  minutes >50% time spent counseling; reviewing medical record; tests; labs; and developing  future plan of care   Synthia InnocentDeborah  NP [facility]  Contact [phone] Monday through Friday 8am- 5pm  After hours call [phone]

## 2015-04-12 NOTE — Progress Notes (Signed)
Patient ID: Mark Roberson, male   DOB: 07/29/1946, 69 y.o.   MRN: [id number]  [facility]       Allergies  Allergen Reactions  . Percocet [Oxycodone-Acetaminophen] Other (See Comments)    hallucination  . Penicillins Hives    Chief Complaint  Patient presents with  . Discharge Note    Discharge from SNF    HPI:    Past Medical History  Diagnosis Date  . CHF (congestive heart failure)   . Atrial fibrillation   . Diabetes mellitus without complication   . Hypertension   . Sleep apnea   . Asthma     Past Surgical History  Procedure Laterality Date  . 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 Wound debridement Right   . Multiple extractions with alveoloplasty N/A 01/17/2015    Procedure: Extraction of tooth #'s 14,15, 16 with alveolopalsty;  Surgeon: Charlynne Panderonald F Kulinski, DDS;  [facility];  Service: Oral Surgery;  Laterality: N/A;  . Cardioversion N/A 04/03/2015    Procedure: CARDIOVERSION;  Surgeon: Laurey Moralealton S McLean, MD;  [facility];  Service: Cardiovascular;  Laterality: N/A;    VITAL SIGNS There were no vitals taken for this visit.  Patient's Medications  New Prescriptions   No medications on file  Previous Medications   ACETAMINOPHEN (TYLENOL) 500 MG TABLET    Take 500 mg by mouth every 4 (four) hours as needed (pain). Do not exceed 4 gms of tylenol in 24 hours   ALBUTEROL (PROVENTIL) (2.5 MG/3ML) 0.083% NEBULIZER SOLUTION    Take 3 mLs (2.5 mg total) by nebulization every 6 (six) hours as needed for wheezing or shortness of breath.   AMIODARONE (PACERONE) 200 MG TABLET    Take 1 tablet (200 mg total) by mouth 2 (two) times daily.   ATORVASTATIN (LIPITOR) 20 MG TABLET    Take 20 mg by mouth at bedtime.    CARVEDILOL (COREG) 3.125 MG TABLET    Take 3.125 mg by mouth 2 (two) times daily with a meal.   CHOLECALCIFEROL (VITAMIN D) 2000 UNITS TABLET    Take 2,000 Units by mouth daily.   GABAPENTIN (NEURONTIN) 300 MG CAPSULE     Take 300 mg by mouth 3 (three) times daily.   INSULIN ASPART (NOVOLOG FLEXPEN) 100 UNIT/ML FLEXPEN    Inject 30 Units into the skin 3 (three) times daily before meals. Check blood glucose before meals   INSULIN GLARGINE (LANTUS SOLOSTAR) 100 UNIT/ML SOLOSTAR PEN    Inject 71 Units into the skin at bedtime.    MAGNESIUM 250 MG TABS    Take 500 mg by mouth daily. 2 by mouth daily   METOLAZONE (ZAROXOLYN) 2.5 MG TABLET    Take 1 tablet (2.5 mg total) by mouth 2 (two) times a week. Every Monday and Friday   MISC. DEVICES (WHEELCHAIR CUSHION) MISC    Use to cushion wheelchair   MULTIPLE VITAMINS-MINERALS (DECUBI-VITE) CAPS    Take 1 capsule by mouth daily.   OMEGA 3-6-9 CAPS    Take 2 capsules by mouth daily.   POTASSIUM CHLORIDE SA (K-DUR,KLOR-CON) 20 MEQ TABLET    Take 1 tablet (20 mEq total) by mouth 2 (two) times a week. Every Monday and Friday with Metolazone   PROAIR HFA 108 (90 BASE) MCG/ACT INHALER    INHALE 1 TO 2 PUFFS BY MOUTH FOUR TIMES DAILY AS DIRECTED AS NEEDED FOR WHEEZING AND SHORTNESS OF BREATH   RIVAROXABAN (XARELTO) 15  MG TABS TABLET    Take 1 tablet (15 mg total) by mouth daily with supper.   TORSEMIDE (DEMADEX) 20 MG TABLET    Take 40 mg by mouth 2 (two) times daily.    TRANSPARENT DRESSINGS (TEGADERM + PAD 3-1/2"X6") MISC    Apply to wound daily  Modified Medications   No medications on file  Discontinued Medications   No medications on file     SIGNIFICANT DIAGNOSTIC EXAMS    Review of Systems    Physical Exam     ASSESSMENT/ PLAN:    Synthia Innocent NP [facility]  Contact [phone] Monday through Friday 8am- 5pm  After hours call [phone]

## 2015-04-26 ENCOUNTER — Other Ambulatory Visit: Payer: Self-pay | Admitting: *Deleted

## 2015-04-27 ENCOUNTER — Encounter (HOSPITAL_COMMUNITY): Payer: Self-pay

## 2015-04-27 ENCOUNTER — Ambulatory Visit (HOSPITAL_COMMUNITY)
Admit: 2015-04-27 | Discharge: 2015-04-27 | Disposition: A | Discharge status: Home / Self Care | Payer: Medicare Other | Source: Ambulatory Visit | Attending: Cardiology | Admitting: Cardiology

## 2015-04-27 VITALS — BP 148/64 | HR 69 | Wt 342.8 lb

## 2015-04-27 DIAGNOSIS — Z9981 Dependence on supplemental oxygen: Secondary | ICD-10-CM | POA: Insufficient documentation

## 2015-04-27 DIAGNOSIS — R609 Edema, unspecified: Secondary | ICD-10-CM

## 2015-04-27 DIAGNOSIS — I481 Persistent atrial fibrillation: Secondary | ICD-10-CM | POA: Insufficient documentation

## 2015-04-27 DIAGNOSIS — G4733 Obstructive sleep apnea (adult) (pediatric): Secondary | ICD-10-CM | POA: Insufficient documentation

## 2015-04-27 DIAGNOSIS — Z79899 Other long term (current) drug therapy: Secondary | ICD-10-CM | POA: Diagnosis not present

## 2015-04-27 DIAGNOSIS — Z8249 Family history of ischemic heart disease and other diseases of the circulatory system: Secondary | ICD-10-CM | POA: Insufficient documentation

## 2015-04-27 DIAGNOSIS — I2510 Atherosclerotic heart disease of native coronary artery without angina pectoris: Secondary | ICD-10-CM | POA: Insufficient documentation

## 2015-04-27 DIAGNOSIS — Z66 Do not resuscitate: Secondary | ICD-10-CM | POA: Diagnosis not present

## 2015-04-27 DIAGNOSIS — I129 Hypertensive chronic kidney disease with stage 1 through stage 4 chronic kidney disease, or unspecified chronic kidney disease: Secondary | ICD-10-CM | POA: Diagnosis not present

## 2015-04-27 DIAGNOSIS — Z7902 Long term (current) use of antithrombotics/antiplatelets: Secondary | ICD-10-CM | POA: Insufficient documentation

## 2015-04-27 DIAGNOSIS — E1165 Type 2 diabetes mellitus with hyperglycemia: Secondary | ICD-10-CM | POA: Insufficient documentation

## 2015-04-27 DIAGNOSIS — Z955 Presence of coronary angioplasty implant and graft: Secondary | ICD-10-CM | POA: Diagnosis not present

## 2015-04-27 DIAGNOSIS — I4892 Unspecified atrial flutter: Secondary | ICD-10-CM

## 2015-04-27 DIAGNOSIS — I272 Other secondary pulmonary hypertension: Secondary | ICD-10-CM | POA: Diagnosis not present

## 2015-04-27 DIAGNOSIS — I5032 Chronic diastolic (congestive) heart failure: Secondary | ICD-10-CM | POA: Insufficient documentation

## 2015-04-27 DIAGNOSIS — N189 Chronic kidney disease, unspecified: Secondary | ICD-10-CM | POA: Insufficient documentation

## 2015-04-27 DIAGNOSIS — Z794 Long term (current) use of insulin: Secondary | ICD-10-CM | POA: Diagnosis not present

## 2015-04-27 DIAGNOSIS — E1122 Type 2 diabetes mellitus with diabetic chronic kidney disease: Secondary | ICD-10-CM | POA: Diagnosis not present

## 2015-04-27 LAB — TSH: TSH: 7.615 u[IU]/mL — ABNORMAL HIGH (ref 0.350–4.500)

## 2015-04-27 LAB — COMPREHENSIVE METABOLIC PANEL
ALT: 16 U/L — ABNORMAL LOW (ref 17–63)
ANION GAP: 7 (ref 5–15)
AST: 24 U/L (ref 15–41)
Albumin: 3.1 g/dL — ABNORMAL LOW (ref 3.5–5.0)
Alkaline Phosphatase: 74 U/L (ref 38–126)
BILIRUBIN TOTAL: 0.8 mg/dL (ref 0.3–1.2)
BUN: 49 mg/dL — ABNORMAL HIGH (ref 6–20)
CO2: 41 mmol/L — ABNORMAL HIGH (ref 22–32)
CREATININE: 2.03 mg/dL — AB (ref 0.61–1.24)
Calcium: 9.3 mg/dL (ref 8.9–10.3)
Chloride: 91 mmol/L — ABNORMAL LOW (ref 101–111)
GFR calc Af Amer: 37 mL/min — ABNORMAL LOW (ref >60)
GFR calc non Af Amer: 32 mL/min — ABNORMAL LOW (ref >60)
GLUCOSE: 207 mg/dL — AB (ref 65–99)
POTASSIUM: 4.1 mmol/L (ref 3.5–5.1)
SODIUM: 139 mmol/L (ref 135–145)
Total Protein: 7.1 g/dL (ref 6.5–8.1)

## 2015-04-27 MED ORDER — TORSEMIDE 20 MG PO TABS: ORAL_TABLET | ORAL | Status: DC

## 2015-04-27 NOTE — Patient Instructions (Signed)
Stop Amiodarone  Increase Torsemide to 80 mg (4 tabs) in AM and 40 mg (2 tabs) in PM  Labs today  Labs in 1 week  Please wear compression stocking during the day and remove at night  Your physician recommends that you schedule a follow-up appointment in: 2-3 weeks

## 2015-04-27 NOTE — Progress Notes (Signed)
Patient ID: Mark Roberson, male   DOB: Oct 24, 1945, 69 y.o.   MRN: [id number]  PCP: Dr. 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  He was seen by a cardiologist up in [place], but moved earlier this year to [facility].  He had PCI back in the 1990s, no cath since the '90s.  He had a successful TEE-guided DCCV for atrial fibrillation in [place] in 2/16.  Later in 2/16, he moved to [facility].  He was admitted to [facility] with acute on chronic diastolic CHF.  He was back in atrial fibrillation.  He was admitted again in 5/16 with dyspnea, weakness, and edema.  He was diuresed. He had epistaxis and Xarelto was briefly held.  He had tooth abscesses treated.  He remained in atrial fibrillation at the time of that appointment.   He returns for HF follow up. Last visit we scheduled a DCCV and made a referral to Endocrine. Coreg has also been stopped 2/2 to bradycardia. DCCV in 7/16 led to brief NSR but he reverted to atrial fibrillation.  EKG today shows atrial fibrillation. Weight 325 at last visit.  342 this visit.   Has only been taking metolazone on Mondays, feels like he pees a lot more on those days. Remains SOB with exertion. Now on 02 24 hrs a day. He is confined to using a wheelchair or walker.  Able to walk to bathroom without getting SOB, but does desat, even on 02. Limited mobility. He has left [employer] and is now living with friends, and weight has steadily increased.  No chest pain.  Denies Orthopnea or PND.  Labs (2/16): LDL 64 Labs (5/16): K 4.5, creatinine 1.81, Hgb 8.8 Labs (6/16): K 4.1 Creatinine 1.32 , HCT 28.6, BNP 223 Labs (7/16): K 3.3, Creatinine 1.84, HCT 26  PMH: 1. CKD: Suspect diabetic nephropathy plays a role.  2. Diabetes 3. OSA: Severe on 5/16 sleep study.  Not yet on CPAP.  4. Chronic diastolic CHF: Echo (2/16) with EF 60-65%, mild MR, moderately dilated RV with normal  systolic function, PA systolic pressure 50 mmHg.  5. Atrial fibrillation: Now persistent.  He had successful TEE-guided DCCV in 2/16 in [place] but atrial fibrillation recurred not long after. DCCV 7/16 on amiodarone => atrial fibrillation quickly recurred.  6. 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  He is on home oxygen.  8. CAD: s/p PCI with DES in the 1990s.  No cardiac cath since 1990s.  9. Anemia 10. 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  SH: Living with friends in [place], originally from [place] (moved to [place] in 2016).  Nonsmoker.  No ETOH.   FH: CAD  ROS: All systems reviewed and negative except as per HPI.   Current Outpatient Prescriptions  Medication Sig Dispense Refill  . acetaminophen (TYLENOL) 500 MG tablet Take 500 mg by mouth every 4 (four) hours as needed (pain). Do not exceed 4 gms of tylenol in 24 hours    . albuterol (PROVENTIL) (2.5 MG/3ML) 0.083% nebulizer solution Take 3 mLs (2.5 mg total) by nebulization every 6 (six) hours as needed for wheezing or shortness of breath. 75 mL 1  . amiodarone (PACERONE) 200 MG tablet Take 1 tablet (200 mg total) by mouth 2 (two) times daily. (Patient taking differently: Take 200 mg by mouth 2 (two) times daily. 9am and 5pm) 90 tablet 3  . atorvastatin (LIPITOR) 20 MG tablet Take 20 mg  by mouth at bedtime.     . carvedilol (COREG) 3.125 MG tablet Take 3.125 mg by mouth 2 (two) times daily with a meal.    . Cholecalciferol (VITAMIN D) 2000 UNITS tablet Take 2,000 Units by mouth daily.    Marland Kitchen gabapentin (NEURONTIN) 300 MG capsule Take 300 mg by mouth 3 (three) times daily.    . insulin aspart (NOVOLOG FLEXPEN) 100 UNIT/ML FlexPen Inject 30 Units into the skin 3 (three) times daily before meals. Check blood glucose before meals    . Insulin Glargine (LANTUS SOLOSTAR) 100 UNIT/ML Solostar Pen Inject 71 Units into the skin at bedtime.     . Magnesium 250 MG TABS Take 500 mg by mouth daily. 2 by mouth daily    . metolazone (ZAROXOLYN) 2.5 MG tablet Take 1 tablet (2.5 mg  total) by mouth 2 (two) times a week. Every Monday and Friday 90 tablet 3  . Misc. Devices [facility] CUSHION) MISC Use to cushion wheelchair (Patient not taking: Reported on 04/12/2015) 1 each 0  . Multiple Vitamins-Minerals (DECUBI-VITE) CAPS Take 1 capsule by mouth daily.    [name] 3-6-9 CAPS Take 2 capsules by mouth daily.    . potassium chloride SA (K-DUR,KLOR-CON) 20 MEQ tablet Take 1 tablet (20 mEq total) by mouth 2 (two) times a week. Every Monday and Friday with Metolazone 90 tablet 3  . PROAIR HFA 108 (90 BASE) MCG/ACT inhaler INHALE 1 TO 2 PUFFS BY MOUTH FOUR TIMES DAILY AS DIRECTED AS NEEDED FOR WHEEZING AND SHORTNESS OF BREATH 8.5 g 0  . Rivaroxaban (XARELTO) 15 MG TABS tablet Take 1 tablet (15 mg total) by mouth daily with supper. (Patient taking differently: Take 15 mg by mouth daily with breakfast. 9am) 30 tablet   . torsemide (DEMADEX) 20 MG tablet Take 40 mg by mouth 2 (two) times daily.   3  . Transparent Dressings (TEGADERM + PAD 3-1/2"X6") MISC Apply to wound daily (Patient not taking: Reported on 04/12/2015) 10 each 0   No current facility-administered medications for this encounter.   BP 148/64 mmHg  Pulse 69  Wt 342 lb 12.8 oz (155.493 kg)  SpO2 96% General: NAD, obese. Arrived in wheelchair.  Neck: Thick, JVP 10 cm with HJR, no thyromegaly or thyroid nodule.  Lungs: Slight crackles at bases bilaterally.  CV: Nondisplaced PMI.  Irregular S1/S2, no S3/S4, no murmur.  2+ edema 1/2 to knees bilaterally.  No carotid bruit.  Normal pedal pulses.  Abdomen: Soft, nontender, no hepatosplenomegaly, no distention.  Skin: Intact without lesions or rashes.  Neurologic: Alert and oriented x 3.  Psych: Normal affect. Extremities: No clubbing or cyanosis.  HEENT: Normal.   EKG: atrial fibrillation  Assessment/Plan: 1. Chronic diastolic CHF: EF 16-10% on last echo with RV enlargement.  PA pressure elevated by echo.  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  He has NYHA class III symptoms. Volume overloaded. Weight up 17 lbs since last visit.  I am concerned that diuresis is going to be limited by his renal function.  - Increase torsemide 80 mg am 40 mg pm  - Continue metolazone 2.5 mg on Mondays   - He will take KCl 20 on metolazone days.  - Check BMET today and next week - Wear compression stockings during the day.  2. Pulmonary hypertension: Elevated PA pressure on last echo.  As above,  suspect combination of group 2 (pulmonary venous hypertension) and group 3 (OHS/OSA) pulmonary hypertension.  This will need to be treated by oxygen, CPAP, and diuresis.  3. Atrial fibrillation: This has been persistent, probably since 2/16.  He is back in atrial fibrillation today, unable to hold NSR after DCCV in 7/16.   - Stop amiodarone as he is not maintaining NSR.  Hold off on adding beta blocker given bradycardia.  - Continue Xarelto 15 mg daily (GFR < 50).  4. OSA/suspected OHS: Continue oxygen continuously.  He will need to start on CPAP given severe OSA. Referred to pulmonary. Has appointment 05/10/15 with Dr Craige Cotta.  5. CAD: Stable, no chest pain.  He is on Xarelto with stable CAD so not on aspirin.  Continue statin, good lipids earlier this year.  6. CKD: Follow creatinine closely with diuresis, BMET today.  7. Uncontrolled DM: Most recent glucose 333 per BMET . Endocrinology following per pt.  8. DNR   Follow up 2 weeks  Cornel Werber PA-C 04/27/2015   Patient seen with PA, agree with the above note.  He did not hold NSR after DCCV.  Will stop amiodarone as it does not appear to be helping.  Does not need nodal blocker for now with low rate. Volume overloaded today.  As above, increase torsemide to 80 qam/40 qpm and continue current metolazone.  Will need close followup of creatinine, this may be  limiting.   Marca Ancona 04/28/2015

## 2015-05-01 ENCOUNTER — Telehealth (HOSPITAL_COMMUNITY): Payer: Self-pay | Admitting: Cardiology

## 2015-05-01 DIAGNOSIS — I5022 Chronic systolic (congestive) heart failure: Secondary | ICD-10-CM

## 2015-05-01 MED ORDER — POTASSIUM CHLORIDE CRYS ER 20 MEQ PO TBCR: 20.0000 meq | EXTENDED_RELEASE_TABLET | ORAL | Status: DC

## 2015-05-01 MED ORDER — METOLAZONE 2.5 MG PO TABS: ORAL_TABLET | ORAL | Status: DC

## 2015-05-01 NOTE — Telephone Encounter (Signed)
Pt/patients caregiver called with concerns regarding increased weight (up 7 lbs, weight Thursday 8/4 338 weight 8/8 346 lbs) Denies SOB however pt does report edema in legs/ankles and increased fatigue   Per VO Dr.Bensimhon pt should increase Torsemide to 80 mg BID, Metolazone 5 mg daily, and 20 KCL x 2 days If symptoms persist or pt does not feel better pt should call for further instructions and admission into hospital  [place]rb[place] Sink, patients caregiver aware and voiced understanding ([phone])

## 2015-05-02 ENCOUNTER — Telehealth (HOSPITAL_COMMUNITY): Payer: Self-pay | Admitting: Cardiology

## 2015-05-02 ENCOUNTER — Emergency Department (HOSPITAL_COMMUNITY): Payer: Medicare Other

## 2015-05-02 ENCOUNTER — Inpatient Hospital Stay (HOSPITAL_COMMUNITY)
Admission: EM | Admit: 2015-05-02 | Discharge: 2015-05-14 | DRG: 292 | Disposition: A | Discharge status: Home Health | Payer: Medicare Other | Attending: Internal Medicine | Admitting: Internal Medicine

## 2015-05-02 ENCOUNTER — Encounter (HOSPITAL_COMMUNITY): Payer: Self-pay

## 2015-05-02 DIAGNOSIS — I272 Other secondary pulmonary hypertension: Secondary | ICD-10-CM | POA: Diagnosis present

## 2015-05-02 DIAGNOSIS — N179 Acute kidney failure, unspecified: Secondary | ICD-10-CM

## 2015-05-02 DIAGNOSIS — Z885 Allergy status to narcotic agent status: Secondary | ICD-10-CM | POA: Diagnosis not present

## 2015-05-02 DIAGNOSIS — I481 Persistent atrial fibrillation: Secondary | ICD-10-CM | POA: Diagnosis present

## 2015-05-02 DIAGNOSIS — Z880 Allergy status to penicillin: Secondary | ICD-10-CM

## 2015-05-02 DIAGNOSIS — I5021 Acute systolic (congestive) heart failure: Secondary | ICD-10-CM | POA: Diagnosis not present

## 2015-05-02 DIAGNOSIS — R600 Localized edema: Secondary | ICD-10-CM | POA: Diagnosis not present

## 2015-05-02 DIAGNOSIS — E1129 Type 2 diabetes mellitus with other diabetic kidney complication: Secondary | ICD-10-CM | POA: Diagnosis present

## 2015-05-02 DIAGNOSIS — E11649 Type 2 diabetes mellitus with hypoglycemia without coma: Secondary | ICD-10-CM | POA: Diagnosis not present

## 2015-05-02 DIAGNOSIS — R197 Diarrhea, unspecified: Secondary | ICD-10-CM | POA: Diagnosis present

## 2015-05-02 DIAGNOSIS — I4892 Unspecified atrial flutter: Secondary | ICD-10-CM | POA: Diagnosis present

## 2015-05-02 DIAGNOSIS — E1165 Type 2 diabetes mellitus with hyperglycemia: Secondary | ICD-10-CM | POA: Diagnosis present

## 2015-05-02 DIAGNOSIS — G4733 Obstructive sleep apnea (adult) (pediatric): Secondary | ICD-10-CM | POA: Diagnosis present

## 2015-05-02 DIAGNOSIS — E1122 Type 2 diabetes mellitus with diabetic chronic kidney disease: Secondary | ICD-10-CM | POA: Diagnosis present

## 2015-05-02 DIAGNOSIS — Z8249 Family history of ischemic heart disease and other diseases of the circulatory system: Secondary | ICD-10-CM

## 2015-05-02 DIAGNOSIS — D638 Anemia in other chronic diseases classified elsewhere: Secondary | ICD-10-CM | POA: Diagnosis present

## 2015-05-02 DIAGNOSIS — I130 Hypertensive heart and chronic kidney disease with heart failure and stage 1 through stage 4 chronic kidney disease, or unspecified chronic kidney disease: Secondary | ICD-10-CM | POA: Diagnosis present

## 2015-05-02 DIAGNOSIS — Z66 Do not resuscitate: Secondary | ICD-10-CM | POA: Diagnosis present

## 2015-05-02 DIAGNOSIS — I4891 Unspecified atrial fibrillation: Secondary | ICD-10-CM | POA: Diagnosis not present

## 2015-05-02 DIAGNOSIS — N183 Chronic kidney disease, stage 3 unspecified: Secondary | ICD-10-CM

## 2015-05-02 DIAGNOSIS — R609 Edema, unspecified: Secondary | ICD-10-CM | POA: Diagnosis present

## 2015-05-02 DIAGNOSIS — Z6841 Body Mass Index (BMI) 40.0 and over, adult: Secondary | ICD-10-CM

## 2015-05-02 DIAGNOSIS — E873 Alkalosis: Secondary | ICD-10-CM | POA: Diagnosis present

## 2015-05-02 DIAGNOSIS — R001 Bradycardia, unspecified: Secondary | ICD-10-CM | POA: Diagnosis not present

## 2015-05-02 DIAGNOSIS — J45909 Unspecified asthma, uncomplicated: Secondary | ICD-10-CM | POA: Diagnosis present

## 2015-05-02 DIAGNOSIS — Z9861 Coronary angioplasty status: Secondary | ICD-10-CM | POA: Diagnosis not present

## 2015-05-02 DIAGNOSIS — I2510 Atherosclerotic heart disease of native coronary artery without angina pectoris: Secondary | ICD-10-CM | POA: Diagnosis present

## 2015-05-02 DIAGNOSIS — D509 Iron deficiency anemia, unspecified: Secondary | ICD-10-CM | POA: Diagnosis present

## 2015-05-02 DIAGNOSIS — I10 Essential (primary) hypertension: Secondary | ICD-10-CM | POA: Diagnosis not present

## 2015-05-02 DIAGNOSIS — Z9981 Dependence on supplemental oxygen: Secondary | ICD-10-CM

## 2015-05-02 DIAGNOSIS — I509 Heart failure, unspecified: Secondary | ICD-10-CM

## 2015-05-02 DIAGNOSIS — Z825 Family history of asthma and other chronic lower respiratory diseases: Secondary | ICD-10-CM | POA: Diagnosis not present

## 2015-05-02 DIAGNOSIS — D649 Anemia, unspecified: Secondary | ICD-10-CM

## 2015-05-02 DIAGNOSIS — Z7901 Long term (current) use of anticoagulants: Secondary | ICD-10-CM | POA: Diagnosis not present

## 2015-05-02 DIAGNOSIS — J9611 Chronic respiratory failure with hypoxia: Secondary | ICD-10-CM | POA: Diagnosis present

## 2015-05-02 DIAGNOSIS — N189 Chronic kidney disease, unspecified: Secondary | ICD-10-CM | POA: Insufficient documentation

## 2015-05-02 DIAGNOSIS — I5033 Acute on chronic diastolic (congestive) heart failure: Principal | ICD-10-CM | POA: Diagnosis present

## 2015-05-02 DIAGNOSIS — Z96652 Presence of left artificial knee joint: Secondary | ICD-10-CM | POA: Diagnosis present

## 2015-05-02 DIAGNOSIS — Z794 Long term (current) use of insulin: Secondary | ICD-10-CM

## 2015-05-02 DIAGNOSIS — I5022 Chronic systolic (congestive) heart failure: Secondary | ICD-10-CM

## 2015-05-02 DIAGNOSIS — N289 Disorder of kidney and ureter, unspecified: Secondary | ICD-10-CM

## 2015-05-02 HISTORY — DX: Chronic kidney disease, stage 3 (moderate): N18.3

## 2015-05-02 HISTORY — DX: Chronic kidney disease, stage 3 unspecified: N18.30

## 2015-05-02 HISTORY — DX: Anemia, unspecified: D64.9

## 2015-05-02 LAB — I-STAT TROPONIN, ED: Troponin i, poc: 0.05 ng/mL (ref 0.00–0.08)

## 2015-05-02 LAB — BASIC METABOLIC PANEL
Anion gap: 12 (ref 5–15)
BUN: 83 mg/dL — ABNORMAL HIGH (ref 6–20)
CALCIUM: 8.8 mg/dL — AB (ref 8.9–10.3)
CO2: 36 mmol/L — AB (ref 22–32)
Chloride: 86 mmol/L — ABNORMAL LOW (ref 101–111)
Creatinine, Ser: 2.8 mg/dL — ABNORMAL HIGH (ref 0.61–1.24)
GFR calc Af Amer: 25 mL/min — ABNORMAL LOW (ref >60)
GFR calc non Af Amer: 22 mL/min — ABNORMAL LOW (ref >60)
Glucose, Bld: 212 mg/dL — ABNORMAL HIGH (ref 65–99)
Potassium: 4.6 mmol/L (ref 3.5–5.1)
SODIUM: 134 mmol/L — AB (ref 135–145)

## 2015-05-02 LAB — CBC
HEMATOCRIT: 28.4 % — AB (ref 39.0–52.0)
Hemoglobin: 8.2 g/dL — ABNORMAL LOW (ref 13.0–17.0)
MCH: 25 pg — ABNORMAL LOW (ref 26.0–34.0)
MCHC: 28.9 g/dL — ABNORMAL LOW (ref 30.0–36.0)
MCV: 86.6 fL (ref 78.0–100.0)
Platelets: 173 10*3/uL (ref 150–400)
RBC: 3.28 MIL/uL — ABNORMAL LOW (ref 4.22–5.81)
RDW: 16.8 % — AB (ref 11.5–15.5)
WBC: 8.5 10*3/uL (ref 4.0–10.5)

## 2015-05-02 MED ORDER — FUROSEMIDE 10 MG/ML IJ SOLN
80.0000 mg | Freq: Once | INTRAMUSCULAR | Status: DC
  Filled 2015-05-02: qty 8

## 2015-05-02 NOTE — Telephone Encounter (Signed)
Mark Kidney, RN called after patients Mark Roberson visit with concerns regarding pts weight Weight has not changed, little to no UOP, increased LE edema, pt sleeps a lot more than usual. Caregiver feels sxs are more than she can handle right now ?long term placement to help with treatment ?IV diuresis    Symptoms and chart reviewed with Mark Roberson Patient should report to ER for further evaluation   Debra,RN and Hickman Sink patients caregiver aware and voiced understanding

## 2015-05-02 NOTE — ED Notes (Signed)
Pt and caregiver reports generalized edema.  Pts weight 04-28-15 337 lbs, 05-01-15 am weight 347, pm weight 348.  Call placed to heart failure clilnic and was advised to increase pm dose of Torsemide 20 mg from 2 tabs to 4 tabs.  Home health nurse came today and advised pt to go to ED.  No shortness of breath or chest pain.

## 2015-05-02 NOTE — H&P (Signed)
[facility] History and Physical  Mark Roberson ZOX:[id number] DOB: 18-Aug-1946 DOA: 05/02/2015  Referring physician: ED physician PCP: [name], MD  Specialists:   Chief Complaint: Worsening leg edema, increased body weight, diarrhea  HPI: Mark Roberson is a 69 y.o. male with PMH of hypertension, diabetes mellitus, diastolic congestive heart failure 9EF 60-65%), atrial fibrillation on Xarelto, OSA, asthma,CKD-III, who presents with worsening leg edema, increased body weight and diarrhea.  Patient reports that in the past several days he noticed that his body weight increased from 337 Lbs 04/28/15 to 348 today. He also has worsening leg edema. He has shortness present which he feels like at baseline. He has mild cough, but no sputum production, no chest pain. He states that he increased his torsemide from 80 mg IN morning and 40 mg in the evening to 80 mg twice a day, but no significant improvement of fluid retention.  He also reports that in the past 3 days, he has several episode of diarrhea with loose stool. He has fluctuance with lots of gas. He took Imodium with improved symptoms. He has nausea, but no vomiting. He has mild abdominal pain. He used Abx in the end of June to early of July for pneumonia. Patient does not have symptoms of UTI or unilateral weakness or rashes.  In ED, patient was found to have WBC 8.5, worsening renal function, negative troponin, BNP 284, temperature normal, bradycardia. Chest x-ray showed mild congestive heart failure change.  Where does patient live?   At home   Can patient participate in ADLs?  Barely  Review of Systems:   General: no fevers, chills, no changes in body weight, has poor appetite, has fatigue HEENT: no blurry vision, hearing changes or sore throat Pulm: has dyspnea, coughing, nowheezing CV: no chest pain, palpitations Abd: has nausea, no vomiting, has abdominal pain, diarrhea, no constipation GU: no dysuria, burning on  urination, increased urinary frequency, hematuria  Ext: has leg edema Neuro: no unilateral weakness, numbness, or tingling, no vision change or hearing loss Skin: no rash MSK: No muscle spasm, no deformity, no limitation of range of movement in spin Heme: No easy bruising.  Travel history: No recent long distant travel.  Allergy:  Allergies  Allergen Reactions  . Percocet [Oxycodone-Acetaminophen] Other (See Comments)    hallucination  . Penicillins Hives    Past Medical History  Diagnosis Date  . CHF (congestive heart failure)   . Atrial fibrillation   . Diabetes mellitus without complication   . Hypertension   . Sleep apnea   . Asthma   . CKD (chronic kidney disease), stage III     Past Surgical History  Procedure Laterality Date  . 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 Wound debridement Right   . Multiple extractions with alveoloplasty N/A 01/17/2015    Procedure: Extraction of tooth #'s 14,15, 16 with alveolopalsty;  Surgeon: Charlynne Pander, DDS;  Location: [facility] OR;  Service: Oral Surgery;  Laterality: N/A;  . Cardioversion N/A 04/03/2015    Procedure: CARDIOVERSION;  Surgeon: Laurey Morale, MD;  Location: [facility] ENDOSCOPY;  Service: Cardiovascular;  Laterality: N/A;    Social History:  reports that he has never smoked. He has never used smokeless tobacco. He reports that he drinks alcohol. He reports that he does not use illicit drugs.  Family History:  Family History  Problem Relation Age of Onset  . COPD Mother   . Heart disease Father   . Cancer Sister  breast  . Heart disease Brother   . Diabetes Neg Hx      Prior to Admission medications   Medication Sig Start Date End Date Taking? Authorizing Provider  acetaminophen (TYLENOL) 500 MG tablet Take 500 mg by mouth every 4 (four) hours as needed (pain). Do not exceed 4 gms of tylenol in 24 hours    Historical Provider, MD  albuterol (PROVENTIL) (2.5 MG/3ML) 0.083% nebulizer solution Take 3 mLs (2.5 mg  total) by nebulization every 6 (six) hours as needed for wheezing or shortness of breath. 11/14/14   [name], MD  atorvastatin (LIPITOR) 20 MG tablet Take 20 mg by mouth at bedtime.     Historical Provider, MD  carvedilol (COREG) 3.125 MG tablet Take 3.125 mg by mouth 2 (two) times daily with a meal.    Historical Provider, MD  Cholecalciferol (VITAMIN D) 2000 UNITS tablet Take 2,000 Units by mouth daily.    Historical Provider, MD  gabapentin (NEURONTIN) 300 MG capsule Take 300 mg by mouth 3 (three) times daily.    Historical Provider, MD  insulin aspart (NOVOLOG FLEXPEN) 100 UNIT/ML FlexPen Inject 30 Units into the skin 3 (three) times daily before meals. Check blood glucose before meals    Historical Provider, MD  Insulin Glargine (LANTUS SOLOSTAR) 100 UNIT/ML Solostar Pen Inject 71 Units into the skin at bedtime.     Historical Provider, MD  Magnesium 250 MG TABS Take 500 mg by mouth daily. 2 by mouth daily    Historical Provider, MD  metolazone (ZAROXOLYN) 2.5 MG tablet Twice a week Monday and Friday or as directed 05/01/15   Dolores Patty, MD  Misc. Devices [facility] CUSHION) MISC Use to cushion wheelchair 11/14/14   [name], MD  Multiple Vitamins-Minerals (DECUBI-VITE) CAPS Take 1 capsule by mouth daily.    Historical Provider, MD  Omega 3-6-9 CAPS Take 2 capsules by mouth daily.    Historical Provider, MD  potassium chloride SA (K-DUR,KLOR-CON) 20 MEQ tablet Take 1 tablet (20 mEq total) by mouth 2 (two) times a week. Every Monday and Friday with Metolazone 05/01/15   Dolores Patty, MD  PROAIR HFA 108 (90 BASE) MCG/ACT inhaler INHALE 1 TO 2 PUFFS BY MOUTH FOUR TIMES DAILY AS DIRECTED AS NEEDED FOR WHEEZING AND SHORTNESS OF BREATH 04/06/15   [name], MD  Rivaroxaban (XARELTO) 15 MG TABS tablet Take 1 tablet (15 mg total) by mouth daily with supper. Patient taking differently: Take 15 mg by mouth daily with breakfast. 9am 03/07/15   Laurey Morale, MD   torsemide [facility]) 20 MG tablet Take 4 tabs in AM and 2 tabs in PM 04/27/15   Laurey Morale, MD  Transparent Dressings (TEGADERM + PAD 3-1/2"X6") MISC Apply to wound daily 11/14/14   [name], MD    Physical Exam: Filed Vitals:   05/02/15 2155 05/02/15 2255 05/02/15 2258 05/02/15 2259  BP: 117/41  121/46   Pulse: 51  46 49  Temp:      TempSrc:      Resp: 16   16  Height:      Weight:      SpO2: 100% 86% 85% 99%   General: Not in acute distress HEENT:       Eyes: PERRL, EOMI, no scleral icterus.       ENT: No discharge from the ears and nose, no pharynx injection, no tonsillar enlargement.        Neck: Positive JVD, no bruit,  no mass felt. Heme: No neck lymph node enlargement. Cardiac: S1/S2, irregularly irregular rhythm, No murmurs, No gallops or rubs. Pulm:  No rales, wheezing, rhonchi or rubs. Abd: Soft, nondistended, nontender, no rebound pain, no organomegaly, BS present. Ext: 3+ pitting leg edema bilaterally. 2+DP/PT pulse bilaterally. Musculoskeletal: No joint deformities, No joint redness or warmth, no limitation of ROM in spin. Skin: No rashes.  Neuro: Alert, oriented X3, cranial nerves II-XII grossly intact, muscle strength 5/5 in all extremities, sensation to light touch intact.  Psych: Patient is not psychotic, no suicidal or hemocidal ideation.  Labs on Admission:  Basic Metabolic Panel:  Recent Labs Lab 04/27/15 1100 05/02/15 1906  NA 139 134*  K 4.1 4.6  CL 91* 86*  CO2 41* 36*  GLUCOSE 207* 212*  BUN 49* 83*  CREATININE 2.03* 2.80*  CALCIUM 9.3 8.8*   Liver Function Tests:  Recent Labs Lab 04/27/15 1100  AST 24  ALT 16*  ALKPHOS 74  BILITOT 0.8  PROT 7.1  ALBUMIN 3.1*   No results for input(s): LIPASE, AMYLASE in the last 168 hours. No results for input(s): AMMONIA in the last 168 hours. CBC:  Recent Labs Lab 05/02/15 1906  WBC 8.5  HGB 8.2*  HCT 28.4*  MCV 86.6  PLT 173   Cardiac Enzymes: No results for input(s):  CKTOTAL, CKMB, CKMBINDEX, TROPONINI in the last 168 hours.  BNP (last 3 results)  Recent Labs  11/08/14 1236 01/11/15 1942 03/07/15 1645  BNP 300.9* 248.0* 222.5*    ProBNP (last 3 results) No results for input(s): PROBNP in the last 8760 hours.  CBG: No results for input(s): GLUCAP in the last 168 hours.  Radiological Exams on Admission: Dg Chest 2 View  05/02/2015   CLINICAL DATA:  Edema.  Weight gain.  Fluid retention for 1 week.  EXAM: CHEST  2 VIEW  COMPARISON:  01/11/2015.  FINDINGS: Interstitial pulmonary edema is present with Kerley B lines. The cardiopericardial silhouette is mildly enlarged for projection. Mediastinal contours are within normal limits. No pleural effusion. Respiratory motion is present on the lateral projection.  IMPRESSION: Interstitial pulmonary edema compatible with mild CHF.   Electronically Signed   By: Andreas Newport M.D.   On: 05/02/2015 19:36    EKG: Independently reviewed.  Abnormal findings:  Afib, low voltage, poor R-wave progression, LAD Assessment/Plan Principal Problem:   CHF exacerbation Active Problems:   Morbid obesity   HTN (hypertension)   Atrial flutter   DM (diabetes mellitus), type 2 with renal complications   Anemia, iron deficiency   OSA (obstructive sleep apnea)   Acute renal failure superimposed on stage 3 chronic kidney disease  CHF exacerbation: Patient has very significant fluid retention. BNP 284.3, which is likely falsely low due to obesity.  -Will admit to tele bed -will treat with IV lasix 80 mg bid -coreg  -will cycle CE X3 -will get 2-D echo to evaluate EF -strict In/Out -Daily body weight. -heart diet -No on ACEI because of CKD -Not on ASA since patient is on blood thinner, xarelto  Atrial Fibrillation: CHA2DS2-VASc Score is 3, needs oral anticoagulation. Patient is on Xarelto at home. Heart rate is well controlled. -continue Xarelto and coreg  HTN (hypertension): -Coreg, and Lasix  DM-II: Last A1c  7.4 on 03/29/15, fairly controled. Patient is taking Lantus at home -will decrease Lantus dose from  50-40 units daily -SSI  AoCKD-III: Baseline Cre is 1.3-1.8, his Cre is 2.80, BUN 83 on admission. Likely due to cardiorenal syndrome. -  Check FeUrea - US-renal - Follow up renal function by BMP - Avoid ACEI and NSAIDs - will treat CHF with Lasix, with understanding that this may worsen her renal function. However, I am hoping this will not happen due to the Starling mechanism. His kidney may be better perfused with improved cardiac function  DVT ppx: Xarelto Code Status: DNR Family Communication:   Yes, patient's POA, Ms Cope  at bed side Disposition Plan: Admit to inpatient   Date of Service 05/02/2015    Lorretta Harp [facility] Pager [phone]  If 7PM-7AM, please contact night-coverage [email or web address] Password [facility] 05/02/2015, 11:37 PM

## 2015-05-02 NOTE — ED Provider Notes (Signed)
CSN: [id number]     Arrival date & time 05/02/15  1805 History   First MD Initiated Contact with Patient 05/02/15 2259     Chief Complaint  Patient presents with  . Edema  . Weight Gain     (Consider location/radiation/quality/duration/timing/severity/associated sxs/prior Treatment) The history is provided by the patient.   69 year old male with a history of congestive heart failure relates that he has had progressive leg swelling and weight gain over the last 4 days. His weight has gone from 337 pounds on August 5 2 348 pounds today. He had increased his torsemide from 40 mg twice a day to 80 mg twice a day, but still gained weight. He took his metolazone 2 consecutive days when it is usually only taken twice a week, but he gained weight in spite of this. He denies chest pain, heaviness, tightness, pressure. He denies dyspnea.  Past Medical History  Diagnosis Date  . CHF (congestive heart failure)   . Atrial fibrillation   . Diabetes mellitus without complication   . Hypertension   . Sleep apnea   . Asthma    Past Surgical History  Procedure Laterality Date  . 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 Wound debridement Right   . Multiple extractions with alveoloplasty N/A 01/17/2015    Procedure: Extraction of tooth #'s 14,15, 16 with alveolopalsty;  Surgeon: Charlynne Pander, DDS;  Location: [facility] OR;  Service: Oral Surgery;  Laterality: N/A;  . Cardioversion N/A 04/03/2015    Procedure: CARDIOVERSION;  Surgeon: Laurey Morale, MD;  Location: [facility] ENDOSCOPY;  Service: Cardiovascular;  Laterality: N/A;   Family History  Problem Relation Age of Onset  . COPD Mother   . Heart disease Father   . Cancer Sister     breast  . Heart disease Brother   . Diabetes Neg Hx    History  Substance Use Topics  . Smoking status: Never Smoker   . Smokeless tobacco: Never Used  . Alcohol Use: Yes     Comment: rare    Review of Systems  All other systems reviewed and are  negative.     Allergies  Percocet and Penicillins  Home Medications   Prior to Admission medications   Medication Sig Start Date End Date Taking? Authorizing Provider  acetaminophen (TYLENOL) 500 MG tablet Take 500 mg by mouth every 4 (four) hours as needed (pain). Do not exceed 4 gms of tylenol in 24 hours    Historical Provider, MD  albuterol (PROVENTIL) (2.5 MG/3ML) 0.083% nebulizer solution Take 3 mLs (2.5 mg total) by nebulization every 6 (six) hours as needed for wheezing or shortness of breath. 11/14/14   [name], MD  atorvastatin (LIPITOR) 20 MG tablet Take 20 mg by mouth at bedtime.     Historical Provider, MD  carvedilol (COREG) 3.125 MG tablet Take 3.125 mg by mouth 2 (two) times daily with a meal.    Historical Provider, MD  Cholecalciferol (VITAMIN D) 2000 UNITS tablet Take 2,000 Units by mouth daily.    Historical Provider, MD  gabapentin (NEURONTIN) 300 MG capsule Take 300 mg by mouth 3 (three) times daily.    Historical Provider, MD  insulin aspart (NOVOLOG FLEXPEN) 100 UNIT/ML FlexPen Inject 30 Units into the skin 3 (three) times daily before meals. Check blood glucose before meals    Historical Provider, MD  Insulin Glargine (LANTUS SOLOSTAR) 100 UNIT/ML Solostar Pen Inject 71 Units into the skin at bedtime.  Historical Provider, MD  Magnesium 250 MG TABS Take 500 mg by mouth daily. 2 by mouth daily    Historical Provider, MD  metolazone (ZAROXOLYN) 2.5 MG tablet Twice a week Monday and Friday or as directed 05/01/15   Dolores Patty, MD  Misc. Devices [facility] CUSHION) MISC Use to cushion wheelchair 11/14/14   [name], MD  Multiple Vitamins-Minerals (DECUBI-VITE) CAPS Take 1 capsule by mouth daily.    Historical Provider, MD  Omega 3-6-9 CAPS Take 2 capsules by mouth daily.    Historical Provider, MD  potassium chloride SA (K-DUR,KLOR-CON) 20 MEQ tablet Take 1 tablet (20 mEq total) by mouth 2 (two) times a week. Every Monday and Friday with  Metolazone 05/01/15   Dolores Patty, MD  PROAIR HFA 108 (90 BASE) MCG/ACT inhaler INHALE 1 TO 2 PUFFS BY MOUTH FOUR TIMES DAILY AS DIRECTED AS NEEDED FOR WHEEZING AND SHORTNESS OF BREATH 04/06/15   [name], MD  Rivaroxaban (XARELTO) 15 MG TABS tablet Take 1 tablet (15 mg total) by mouth daily with supper. Patient taking differently: Take 15 mg by mouth daily with breakfast. 9am 03/07/15   Laurey Morale, MD  torsemide [facility]) 20 MG tablet Take 4 tabs in AM and 2 tabs in PM 04/27/15   Laurey Morale, MD  Transparent Dressings (TEGADERM + PAD 3-1/2"X6") MISC Apply to wound daily 11/14/14   [name], MD   BP 117/41 mmHg  Pulse 51  Temp(Src) 98.2 F (36.8 C) (Oral)  Resp 16  Ht 6' (1.829 m)  Wt 351 lb 3.2 oz (159.303 kg)  BMI 47.62 kg/m2  SpO2 100% Physical Exam  Nursing note and vitals reviewed.  69 year old male, resting comfortably and in no acute distress. Vital signs are significant for bradycardia. Oxygen saturation is 100%, which is normal. Head is normocephalic and atraumatic. PERRLA, EOMI. Oropharynx is clear. Neck is nontender and supple without adenopathy or JVD. Back is nontender and there is no CVA tenderness. Lungs are clear without rales, wheezes, or rhonchi. Chest is nontender. Heart has regular rate and rhythm without murmur. Abdomen is soft, flat, nontender without masses or hepatosplenomegaly and peristalsis is normoactive. Extremities have 3+ edema, full range of motion is present. Skin is warm and dry without rash. Neurologic: Mental status is normal, cranial nerves are intact, there are no motor or sensory deficits.  ED Course  Procedures (including critical care time) Labs Review Results for orders placed or performed during the hospital encounter of 05/02/15  Basic metabolic panel  Result Value Ref Range   Sodium 134 (L) 135 - 145 mmol/L   Potassium 4.6 3.5 - 5.1 mmol/L   Chloride 86 (L) 101 - 111 mmol/L   CO2 36 (H) 22 - 32 mmol/L    Glucose, Bld 212 (H) 65 - 99 mg/dL   BUN 83 (H) 6 - 20 mg/dL   Creatinine, Ser 9.60 (H) 0.61 - 1.24 mg/dL   Calcium 8.8 (L) 8.9 - 10.3 mg/dL   GFR calc non Af Amer 22 (L) >60 mL/min   GFR calc Af Amer 25 (L) >60 mL/min   Anion gap 12 5 - 15  CBC  Result Value Ref Range   WBC 8.5 4.0 - 10.5 K/uL   RBC 3.28 (L) 4.22 - 5.81 MIL/uL   Hemoglobin 8.2 (L) 13.0 - 17.0 g/dL   HCT 45.4 (L) 09.8 - 11.9 %   MCV 86.6 78.0 - 100.0 fL   MCH 25.0 (L) 26.0 -  34.0 pg   MCHC 28.9 (L) 30.0 - 36.0 g/dL   RDW 14.7 (H) 82.9 - 56.2 %   Platelets 173 150 - 400 K/uL  I-stat troponin, ED  Result Value Ref Range   Troponin i, poc 0.05 0.00 - 0.08 ng/mL   Comment 3           Imaging Review Dg Chest 2 View  05/02/2015   CLINICAL DATA:  Edema.  Weight gain.  Fluid retention for 1 week.  EXAM: CHEST  2 VIEW  COMPARISON:  01/11/2015.  FINDINGS: Interstitial pulmonary edema is present with Kerley B lines. The cardiopericardial silhouette is mildly enlarged for projection. Mediastinal contours are within normal limits. No pleural effusion. Respiratory motion is present on the lateral projection.  IMPRESSION: Interstitial pulmonary edema compatible with mild CHF.   Electronically Signed   By: Andreas Newport M.D.   On: 05/02/2015 19:36     EKG Interpretation   Date/Time:  Tuesday May 02 2015 18:51:52 EDT Ventricular Rate:  50 PR Interval:    QRS Duration: 94 QT Interval:  476 QTC Calculation: 433 R Axis:   -25 Text Interpretation:  Atrial fibrillation with slow ventricular response  Low voltage QRS Cannot rule out Anterior infarct , age undetermined  Abnormal ECG When compared with ECG of 04/27/2015, No significant change was  found Confirmed by [facility]  MD, Nafisah Runions ([id number]) on 05/02/2015 11:11:02 PM      Images viewed by me.  MDM   Final diagnoses:  CHF exacerbation  Acute on chronic renal insufficiency  Normocytic anemia  Metabolic alkalosis  Atrial fibrillation with slow ventricular response   Anticoagulant long-term use    CHF exacerbation. He has chronic anemia which is unchanged. He has chronic atrial fibrillation with slow ventricular response which is unchanged. However, he has worsening renal insufficiency which probably accounts for his prior to respond to increased doses of diaphoretic since an outpatient. Metabolic alkalosis is resumed due to dieretic affect. He is given a dose of furosemide and will need to be admitted. Renal function will need to be followed closely. Old records are reviewed showing several other hospitalizations for congestive heart failure. Case is discussed with Dr. Clyde Lundborg of [facility] who agrees to admit the patient.    Dione Booze, MD 05/02/15 [phone]

## 2015-05-03 ENCOUNTER — Encounter (HOSPITAL_COMMUNITY): Payer: Self-pay | Admitting: *Deleted

## 2015-05-03 ENCOUNTER — Other Ambulatory Visit (HOSPITAL_COMMUNITY): Payer: Self-pay

## 2015-05-03 ENCOUNTER — Inpatient Hospital Stay (HOSPITAL_COMMUNITY): Payer: Medicare Other

## 2015-05-03 DIAGNOSIS — I2510 Atherosclerotic heart disease of native coronary artery without angina pectoris: Secondary | ICD-10-CM

## 2015-05-03 DIAGNOSIS — I5021 Acute systolic (congestive) heart failure: Secondary | ICD-10-CM

## 2015-05-03 DIAGNOSIS — I509 Heart failure, unspecified: Secondary | ICD-10-CM

## 2015-05-03 DIAGNOSIS — I10 Essential (primary) hypertension: Secondary | ICD-10-CM

## 2015-05-03 DIAGNOSIS — I4892 Unspecified atrial flutter: Secondary | ICD-10-CM

## 2015-05-03 DIAGNOSIS — G4733 Obstructive sleep apnea (adult) (pediatric): Secondary | ICD-10-CM

## 2015-05-03 DIAGNOSIS — Z7901 Long term (current) use of anticoagulants: Secondary | ICD-10-CM

## 2015-05-03 DIAGNOSIS — I4891 Unspecified atrial fibrillation: Secondary | ICD-10-CM | POA: Diagnosis present

## 2015-05-03 DIAGNOSIS — I5033 Acute on chronic diastolic (congestive) heart failure: Principal | ICD-10-CM

## 2015-05-03 DIAGNOSIS — E873 Alkalosis: Secondary | ICD-10-CM | POA: Insufficient documentation

## 2015-05-03 DIAGNOSIS — R001 Bradycardia, unspecified: Secondary | ICD-10-CM

## 2015-05-03 LAB — TROPONIN I
TROPONIN I: 0.05 ng/mL — AB (ref <0.031)
Troponin I: 0.05 ng/mL — ABNORMAL HIGH (ref <0.031)

## 2015-05-03 LAB — BRAIN NATRIURETIC PEPTIDE: B Natriuretic Peptide: 284.3 pg/mL — ABNORMAL HIGH (ref 0.0–100.0)

## 2015-05-03 LAB — BASIC METABOLIC PANEL
Anion gap: 13 (ref 5–15)
BUN: 85 mg/dL — ABNORMAL HIGH (ref 6–20)
CALCIUM: 9.1 mg/dL (ref 8.9–10.3)
CO2: 36 mmol/L — ABNORMAL HIGH (ref 22–32)
CREATININE: 2.5 mg/dL — AB (ref 0.61–1.24)
Chloride: 86 mmol/L — ABNORMAL LOW (ref 101–111)
GFR calc non Af Amer: 25 mL/min — ABNORMAL LOW (ref >60)
GFR, EST AFRICAN AMERICAN: 29 mL/min — AB (ref >60)
Glucose, Bld: 182 mg/dL — ABNORMAL HIGH (ref 65–99)
Potassium: 4.2 mmol/L (ref 3.5–5.1)
Sodium: 135 mmol/L (ref 135–145)

## 2015-05-03 LAB — MAGNESIUM: Magnesium: 2.8 mg/dL — ABNORMAL HIGH (ref 1.7–2.4)

## 2015-05-03 LAB — MRSA PCR SCREENING: MRSA by PCR: NEGATIVE

## 2015-05-03 LAB — PROTIME-INR
INR: 1.14 (ref 0.00–1.49)
Prothrombin Time: 14.7 seconds (ref 11.6–15.2)

## 2015-05-03 LAB — GLUCOSE, CAPILLARY
GLUCOSE-CAPILLARY: 94 mg/dL (ref 65–99)
Glucose-Capillary: 269 mg/dL — ABNORMAL HIGH (ref 65–99)
Glucose-Capillary: 169 mg/dL — ABNORMAL HIGH (ref 65–99)

## 2015-05-03 LAB — APTT: APTT: 30 s (ref 24–37)

## 2015-05-03 MED ORDER — VITAMIN D 50 MCG (2000 UT) PO TABS: 2000.0000 [IU] | ORAL_TABLET | Freq: Every day | ORAL | Status: DC

## 2015-05-03 MED ORDER — FUROSEMIDE 10 MG/ML IJ SOLN
80.0000 mg | Freq: Two times a day (BID) | INTRAMUSCULAR | Status: DC
  Administered 2015-05-03 – 2015-05-05 (×5): 80 mg via INTRAVENOUS
  Filled 2015-05-03 (×7): qty 8

## 2015-05-03 MED ORDER — VITAMIN D3 25 MCG (1000 UNIT) PO TABS
2000.0000 [IU] | ORAL_TABLET | Freq: Every day | ORAL | Status: DC
  Administered 2015-05-03 – 2015-05-14 (×12): 2000 [IU] via ORAL
  Filled 2015-05-03 (×14): qty 2

## 2015-05-03 MED ORDER — INSULIN GLARGINE 100 UNIT/ML ~~LOC~~ SOLN
40.0000 [IU] | Freq: Every day | SUBCUTANEOUS | Status: DC
  Administered 2015-05-03: 40 [IU] via SUBCUTANEOUS
  Filled 2015-05-03 (×2): qty 0.4

## 2015-05-03 MED ORDER — ACETAMINOPHEN 500 MG PO TABS
500.0000 mg | ORAL_TABLET | ORAL | Status: DC | PRN
  Administered 2015-05-08 – 2015-05-13 (×5): 500 mg via ORAL
  Filled 2015-05-03 (×5): qty 1

## 2015-05-03 MED ORDER — MAGNESIUM OXIDE 400 (241.3 MG) MG PO TABS
400.0000 mg | ORAL_TABLET | Freq: Every day | ORAL | Status: DC
  Administered 2015-05-03 – 2015-05-14 (×12): 400 mg via ORAL
  Filled 2015-05-03 (×14): qty 1

## 2015-05-03 MED ORDER — SODIUM CHLORIDE 0.9 % IJ SOLN: 3.0000 mL | INTRAMUSCULAR | Status: DC | PRN

## 2015-05-03 MED ORDER — SODIUM CHLORIDE 0.9 % IJ SOLN
3.0000 mL | Freq: Two times a day (BID) | INTRAMUSCULAR | Status: DC
  Administered 2015-05-03 – 2015-05-14 (×20): 3 mL via INTRAVENOUS

## 2015-05-03 MED ORDER — CARVEDILOL 3.125 MG PO TABS
3.1250 mg | ORAL_TABLET | Freq: Two times a day (BID) | ORAL | Status: DC
  Filled 2015-05-03 (×3): qty 1

## 2015-05-03 MED ORDER — RIVAROXABAN 15 MG PO TABS
15.0000 mg | ORAL_TABLET | Freq: Every day | ORAL | Status: DC
Start: 2015-05-03 — End: 2015-05-14
  Administered 2015-05-03 – 2015-05-14 (×12): 15 mg via ORAL
  Filled 2015-05-03 (×14): qty 1

## 2015-05-03 MED ORDER — ALBUTEROL SULFATE (2.5 MG/3ML) 0.083% IN NEBU: 2.5000 mg | INHALATION_SOLUTION | Freq: Four times a day (QID) | RESPIRATORY_TRACT | Status: DC | PRN

## 2015-05-03 MED ORDER — ONDANSETRON HCL 4 MG/2ML IJ SOLN: 4.0000 mg | Freq: Four times a day (QID) | INTRAMUSCULAR | Status: DC | PRN

## 2015-05-03 MED ORDER — DECUBI-VITE PO CAPS: 1.0000 | ORAL_CAPSULE | Freq: Every day | ORAL | Status: DC

## 2015-05-03 MED ORDER — ATORVASTATIN CALCIUM 20 MG PO TABS
20.0000 mg | ORAL_TABLET | Freq: Every day | ORAL | Status: DC
  Administered 2015-05-03 – 2015-05-13 (×11): 20 mg via ORAL
  Filled 2015-05-03 (×13): qty 1

## 2015-05-03 MED ORDER — ADULT MULTIVITAMIN W/MINERALS CH
1.0000 | ORAL_TABLET | Freq: Every day | ORAL | Status: DC
  Administered 2015-05-03 – 2015-05-14 (×12): 1 via ORAL
  Filled 2015-05-03 (×14): qty 1

## 2015-05-03 MED ORDER — INSULIN ASPART 100 UNIT/ML ~~LOC~~ SOLN
30.0000 [IU] | Freq: Three times a day (TID) | SUBCUTANEOUS | Status: DC
  Administered 2015-05-03 – 2015-05-04 (×2): 30 [IU] via SUBCUTANEOUS

## 2015-05-03 MED ORDER — MAGNESIUM 250 MG PO TABS: 500.0000 mg | ORAL_TABLET | Freq: Every day | ORAL | Status: DC

## 2015-05-03 MED ORDER — GABAPENTIN 300 MG PO CAPS
300.0000 mg | ORAL_CAPSULE | Freq: Three times a day (TID) | ORAL | Status: DC
Start: 2015-05-03 — End: 2015-05-14
  Administered 2015-05-03 – 2015-05-14 (×35): 300 mg via ORAL
  Filled 2015-05-03 (×40): qty 1

## 2015-05-03 MED ORDER — OMEGA 3-6-9 PO CAPS: 2.0000 | ORAL_CAPSULE | Freq: Every day | ORAL | Status: DC

## 2015-05-03 MED ORDER — INSULIN ASPART 100 UNIT/ML ~~LOC~~ SOLN
0.0000 [IU] | Freq: Three times a day (TID) | SUBCUTANEOUS | Status: DC
  Administered 2015-05-04: 5 [IU] via SUBCUTANEOUS
  Administered 2015-05-05: 2 [IU] via SUBCUTANEOUS
  Administered 2015-05-05: 1 [IU] via SUBCUTANEOUS
  Administered 2015-05-05 – 2015-05-06 (×3): 2 [IU] via SUBCUTANEOUS
  Administered 2015-05-07: 1 [IU] via SUBCUTANEOUS
  Administered 2015-05-07 – 2015-05-08 (×3): 2 [IU] via SUBCUTANEOUS
  Administered 2015-05-09 (×2): 3 [IU] via SUBCUTANEOUS
  Administered 2015-05-09: 1 [IU] via SUBCUTANEOUS
  Administered 2015-05-10: 3 [IU] via SUBCUTANEOUS
  Administered 2015-05-10 – 2015-05-11 (×3): 2 [IU] via SUBCUTANEOUS
  Administered 2015-05-11 – 2015-05-12 (×3): 1 [IU] via SUBCUTANEOUS
  Administered 2015-05-13 – 2015-05-14 (×4): 2 [IU] via SUBCUTANEOUS
  Administered 2015-05-14: 1 [IU] via SUBCUTANEOUS

## 2015-05-03 MED ORDER — INSULIN GLARGINE 100 UNIT/ML SOLOSTAR PEN: 40.0000 [IU] | PEN_INJECTOR | Freq: Every day | SUBCUTANEOUS | Status: DC

## 2015-05-03 MED ORDER — OMEGA-3-ACID ETHYL ESTERS 1 G PO CAPS
2.0000 g | ORAL_CAPSULE | Freq: Every day | ORAL | Status: DC
  Administered 2015-05-03 – 2015-05-14 (×12): 2 g via ORAL
  Filled 2015-05-03 (×14): qty 2

## 2015-05-03 MED ORDER — SODIUM CHLORIDE 0.9 % IV SOLN: 250.0000 mL | INTRAVENOUS | Status: DC | PRN

## 2015-05-03 NOTE — Consult Note (Addendum)
Patient ID: Mark Roberson MRN: [id number] DOB/AGE: 1945/12/09 69 y.o.  Admit date: 05/02/2015 Primary Physician Mark Bonus, MD Primary Cardiologist Dr. Marca Roberson   Chief Complaint  Acute on chronic diastolic heart failure  HPI: Mark Roberson is a 11M with chronic diastolic heart failure, CAD s/p remote PCI, HTN, atrial fibrillation on Xarelto, bradycardia, CKD II, DM Type II and OSA here with acute on chronic diastolic heart failure.  Mark Roberson reports increasing LE edema and weight gain over the last 2-3 weeks.  At baseline he is 324 and his most recent weight is 346. Mark Roberson was admitted to [facility] 4/20 with a heart failure exacerbation.  He was diuresed with IV furosemide and discharged on torsemide.  During that hospitalization he also had atrial flutter and junctional bradycardia.  He was managed with Xarelto, which was transiently held for epistaxis.  Prior to this hospitalization, Mark Roberson underwent TEE and DCCV in [place] in February, 2016.  He was discharged from [facility] to a rehab facility, where he stayed for over 2 months.  Carvedilol was discontinued due to hypotension.  He thinks he started gaining weight even while in the rehab facility.  His discharge weight was 324 lb.    Mark Roberson has seen Dr. Shirlee Roberson several times from June through August, Most recently 8/4. Dr. Shirlee Roberson arranged for repeat DCCV on 7/16 and he remained in sinus rhythm briefly, but had recurrent afib.  He has previously been on amiodarone but continued to have atrial fibrillation, so this was discontinued on 7/16.  He has also been titrating torsemide and started metolazone twice weekly.  Mark Roberson has not had good UOP to metolazone.  He notes continued LE edema and is now using O2 continuously instead of nightly as he was using when discharged in April.  He denies orthopnea, PND or SOB.  He likes to eat out at [employer], but eats foods there that he thinks are low in sodium such at boiled  dumplings and egg drop soup.  He does not add salt to foods and doesn't cook with salt.  He takes in less than 1.5L of fluids daily.  Mark Roberson underwent a sleep study 02/05/15 that revealed severe OSA/hypopnea syndrome.  He has not yet been fitted for a CPAP machine.  Review of Systems:     Cardiac Review of Systems: {Y] = yes [ ]  = no  Chest Pain [    ]  Resting SOB [   ] Exertional SOB  [  ]  Orthopnea [  ]   Pedal Edema [x   ]    Palpitations [  ] Syncope  [  ]   Presyncope [   ]  General Review of Systems: [Y] = yes [  ]=no Constitional: recent weight change [  ]; anorexia [  ]; fatigue [  ]; nausea [  ]; night sweats [  ]; fever [  ]; or chills [  ];                                                                      Eyes : blurred vision [  ]; diplopia [   ]; vision changes [  ];  Amaurosis fugax[  ];  Resp: cough [  ];  wheezing[  ];  hemoptysis[  ];  PND [  ];  GI:  gallstones[  ], vomiting[  ];  dysphagia[  ]; melena[  ];  hematochezia [  ]; heartburn[  ];   GU: kidney stones [  ]; hematuria[  ];   dysuria [  ];  nocturia[  ]; incontinence [  ];             Skin: rash, swelling[  ];, hair loss[  ];  peripheral edema[  ];  or itching[  ]; Musculosketetal: myalgias[  ];  joint swelling[  ];  joint erythema[  ];  joint pain[  ];  back pain[  ];  Heme/Lymph: bruising[  ];  bleeding[  ];  anemia[  ];  Neuro: TIA[  ];  headaches[  ];  stroke[  ];  vertigo[  ];  seizures[  ];   paresthesias[  ];  difficulty walking[  ];  Psych:depression[  ]; anxiety[  ];  Endocrine: diabetes[  ];  thyroid dysfunction[  ];  Other:  Past Medical History  Diagnosis Date  . CHF (congestive heart failure)   . Atrial fibrillation   . Diabetes mellitus without complication   . Hypertension   . Sleep apnea   . Asthma   . CKD (chronic kidney disease), stage III   . Anemia     Medications Prior to Admission  Medication Sig Dispense Refill  . acetaminophen (TYLENOL) 500 MG tablet Take 500 mg by mouth  every 4 (four) hours as needed (pain). Do not exceed 4 gms of tylenol in 24 hours    . albuterol (PROVENTIL) (2.5 MG/3ML) 0.083% nebulizer solution Take 3 mLs (2.5 mg total) by nebulization every 6 (six) hours as needed for wheezing or shortness of breath. 75 mL 1  . atorvastatin (LIPITOR) 20 MG tablet Take 20 mg by mouth at bedtime.     . carvedilol (COREG) 3.125 MG tablet Take 3.125 mg by mouth 2 (two) times daily with a meal.    . Cholecalciferol (VITAMIN D) 2000 UNITS tablet Take 2,000 Units by mouth daily.    Marland Kitchen gabapentin (NEURONTIN) 300 MG capsule Take 300 mg by mouth 3 (three) times daily.    . insulin aspart (NOVOLOG FLEXPEN) 100 UNIT/ML FlexPen Inject 30 Units into the skin 3 (three) times daily before meals. Check blood glucose before meals    . Insulin Glargine (LANTUS SOLOSTAR) 100 UNIT/ML Solostar Pen Inject 50 Units into the skin at bedtime.     . Magnesium 250 MG TABS Take 500 mg by mouth daily. 2 by mouth daily    . metolazone (ZAROXOLYN) 2.5 MG tablet Twice a week Monday and Friday or as directed (Patient taking differently: Take 5 mg by mouth 2 (two) times a week. Twice a week Monday and Friday or as directed) 15 tablet 3  . Multiple Vitamins-Minerals (DECUBI-VITE) CAPS Take 1 capsule by mouth daily.    Mark Roberson 3-6-9 CAPS Take 2 capsules by mouth daily.    . potassium chloride SA (K-DUR,KLOR-CON) 20 MEQ tablet Take 1 tablet (20 mEq total) by mouth 2 (two) times a week. Every Monday and Friday with Metolazone 30 tablet 3  . PROAIR HFA 108 (90 BASE) MCG/ACT inhaler INHALE 1 TO 2 PUFFS BY MOUTH FOUR TIMES DAILY AS DIRECTED AS NEEDED FOR WHEEZING AND SHORTNESS OF BREATH 8.5 g 0  . Rivaroxaban (XARELTO) 15 MG TABS tablet Take 1 tablet (15 mg total) by mouth daily  with supper. (Patient taking differently: Take 15 mg by mouth daily with breakfast. 9am) 30 tablet   . torsemide (DEMADEX) 20 MG tablet Take 4 tabs in AM and 2 tabs in PM 180 tablet 3     . atorvastatin  20 mg Oral QHS  .  carvedilol  3.125 mg Oral BID WC  . cholecalciferol  2,000 Units Oral Daily  . furosemide  80 mg Intravenous Once  . furosemide  80 mg Intravenous BID  . gabapentin  300 mg Oral TID  . insulin aspart  0-9 Units Subcutaneous TID WC  . insulin aspart  30 Units Subcutaneous TID WC  . insulin glargine  40 Units Subcutaneous QHS  . magnesium oxide  400 mg Oral Daily  . multivitamin with minerals  1 tablet Oral Daily  . omega-3 acid ethyl esters  2 g Oral Daily  . Rivaroxaban  15 mg Oral Q breakfast  . sodium chloride  3 mL Intravenous Q12H    Infusions:    Allergies  Allergen Reactions  . Percocet [Oxycodone-Acetaminophen] Other (See Comments)    hallucination  . Penicillins Hives    Social History   Social History  . Marital Status: Single    Spouse Name: N/A  . Number of Children: N/A  . Years of Education: N/A   Occupational History  . Not on file.   Social History Main Topics  . Smoking status: Never Smoker   . Smokeless tobacco: Never Used  . Alcohol Use: Yes     Comment: rare  . Drug Use: No  . Sexual Activity: Not on file   Other Topics Concern  . Not on file   Social History Narrative    Family History  Problem Relation Age of Onset  . COPD Mother   . Heart disease Father   . Cancer Sister     breast  . Heart disease Brother   . Diabetes Neg Hx     PHYSICAL EXAM: Filed Vitals:   05/03/15 0800  BP: 131/59  Pulse: 51  Temp:   Resp:      Intake/Output Summary (Last 24 hours) at 05/03/15 1401 Last data filed at 05/03/15 1219  Gross per 24 hour  Intake    243 ml  Output   3310 ml  Net  -3067 ml    General:  Well appearing. No respiratory difficulty sitting upright.  Morbidly obese. HEENT: normal Neck: supple. JVP above mandible at 45 degrees.  Carotids 2+ bilat; no bruits. No lymphadenopathy or thryomegaly appreciated. Cor: PMI nondisplaced. Regular rate & rhythm. No rubs, gallops or murmurs. Lungs: clear Abdomen: soft, nontender,  nondistended. No hepatosplenomegaly. No bruits or masses. Good bowel sounds. Extremities: no cyanosis, clubbing, or rash, 2+ pitting edema to the knees bilaterally Neuro: alert & oriented x 3, cranial nerves grossly intact. moves all 4 extremities w/o difficulty. Affect pleasant.  Results for orders placed or performed during the hospital encounter of 05/02/15 (from the past 24 hour(s))  Basic metabolic panel     Status: Abnormal   Collection Time: 05/02/15  7:06 PM  Result Value Ref Range   Sodium 134 (L) 135 - 145 mmol/L   Potassium 4.6 3.5 - 5.1 mmol/L   Chloride 86 (L) 101 - 111 mmol/L   CO2 36 (H) 22 - 32 mmol/L   Glucose, Bld 212 (H) 65 - 99 mg/dL   BUN 83 (H) 6 - 20 mg/dL   Creatinine, Ser 1.61 (H) 0.61 - 1.24 mg/dL  Calcium 8.8 (L) 8.9 - 10.3 mg/dL   GFR calc non Af Amer 22 (L) >60 mL/min   GFR calc Af Amer 25 (L) >60 mL/min   Anion gap 12 5 - 15  CBC     Status: Abnormal   Collection Time: 05/02/15  7:06 PM  Result Value Ref Range   WBC 8.5 4.0 - 10.5 K/uL   RBC 3.28 (L) 4.22 - 5.81 MIL/uL   Hemoglobin 8.2 (L) 13.0 - 17.0 g/dL   HCT 40.9 (L) 81.1 - 91.4 %   MCV 86.6 78.0 - 100.0 fL   MCH 25.0 (L) 26.0 - 34.0 pg   MCHC 28.9 (L) 30.0 - 36.0 g/dL   RDW 78.2 (H) 95.6 - 21.3 %   Platelets 173 150 - 400 K/uL  I-stat troponin, ED     Status: None   Collection Time: 05/02/15  7:21 PM  Result Value Ref Range   Troponin i, poc 0.05 0.00 - 0.08 ng/mL   Comment 3          Brain natriuretic peptide     Status: Abnormal   Collection Time: 05/03/15  5:16 AM  Result Value Ref Range   B Natriuretic Peptide 284.3 (H) 0.0 - 100.0 pg/mL  Basic metabolic panel     Status: Abnormal   Collection Time: 05/03/15  5:16 AM  Result Value Ref Range   Sodium 135 135 - 145 mmol/L   Potassium 4.2 3.5 - 5.1 mmol/L   Chloride 86 (L) 101 - 111 mmol/L   CO2 36 (H) 22 - 32 mmol/L   Glucose, Bld 182 (H) 65 - 99 mg/dL   BUN 85 (H) 6 - 20 mg/dL   Creatinine, Ser 0.86 (H) 0.61 - 1.24 mg/dL    Calcium 9.1 8.9 - 57.8 mg/dL   GFR calc non Af Amer 25 (L) >60 mL/min   GFR calc Af Amer 29 (L) >60 mL/min   Anion gap 13 5 - 15  Magnesium     Status: Abnormal   Collection Time: 05/03/15  5:16 AM  Result Value Ref Range   Magnesium 2.8 (H) 1.7 - 2.4 mg/dL  Protime-INR     Status: None   Collection Time: 05/03/15  7:20 AM  Result Value Ref Range   Prothrombin Time 14.7 11.6 - 15.2 seconds   INR 1.14 0.00 - 1.49  APTT     Status: None   Collection Time: 05/03/15  7:20 AM  Result Value Ref Range   aPTT 30 24 - 37 seconds  MRSA PCR Screening     Status: None   Collection Time: 05/03/15 11:30 AM  Result Value Ref Range   MRSA by PCR NEGATIVE NEGATIVE  Glucose, capillary     Status: Abnormal   Collection Time: 05/03/15 12:09 PM  Result Value Ref Range   Glucose-Capillary 269 (H) 65 - 99 mg/dL  Troponin I (q 6hr x 3)     Status: Abnormal   Collection Time: 05/03/15 12:37 PM  Result Value Ref Range   Troponin I 0.05 (H) <0.031 ng/mL   CXR:  Dg Chest 2 View  05/02/2015   CLINICAL DATA:  Edema.  Weight gain.  Fluid retention for 1 week.  EXAM: CHEST  2 VIEW  COMPARISON:  01/11/2015.  FINDINGS: Interstitial pulmonary edema is present with Kerley B lines. The cardiopericardial silhouette is mildly enlarged for projection. Mediastinal contours are within normal limits. No pleural effusion. Respiratory motion is present on the lateral projection.  IMPRESSION: Interstitial  pulmonary edema compatible with mild CHF.   Electronically Signed   By: Andreas Newport M.D.   On: 05/02/2015 19:36     ECG: Atrial fibrillation, ventricular rate 50 bpm.  Low voltage.  TTE: EF 60-65%.  Mild MR.  Moderately dilated RV with normal systolic function.  PASP 50 mmHg.    ASSESSMENT/PLAN:  # Acute on chronic diastolic heart failure: Patient is markedly volume overloaded.  Agree with diuresis despite his acute on chronic renal failure.  This will likely improve with diuresis.  On my quick review of his echo,  he has grade 3-4 diastolic dysfunction.  Most of his LV filling occurs early in diastole, so avoiding bradycardia is key for him.  Avoid beta blockers. - Continue lasix 80mg  IV bid - Discontinue carvedilol - goal at least -1.5 to 2L /day - Na <1.5g, fluid <1.5L - Patient advised to avoid [employer] restaurants and eating out - daily weights and strict I/Os  # Persistent atrial fibrillation: Patient's rates are well-controlled.  This patients CHA2DS2-VASc Score and unadjusted Ischemic Stroke Rate (% per year) is equal to 4.8 % stroke rate/year from a score of 4 Above score calculated as 1 point each if present [CHF, HTN, DM, Vascular=MI/PAD/Aortic Plaque, Age if 65-74, or Male] Above score calculated as 2 points each if present [Age > 75, or Stroke/TIA/TE] - hold nodal agents as above - continue Xarelto  # Bradycardia: Holding carvedilol as above.  He seems minimally symptomatic so no indication for PPM at this time.  # CAD: Currently asymptomatic.  Continue statin.  Technically Mr. Hiney has an indication for PPM, as he has a history of CAD and cannot tolerate a beta blocker due to bradycardia.  However, this was remote, and he has no angina or symptoms.  Will consider this if he has persistently difficult to manage diastolic HF, as I suspect this may be at least partially due to his bradycardia.  # OSA: Patient has known severe OSA and has not yet been fitted for CPAP.  Recommend that he initiate therapy as soon as possible, as this may help both his heart failure and atrial fibrillation.  Signed: Madilyn Hook 05/03/2015, 2:01 PM

## 2015-05-03 NOTE — Progress Notes (Signed)
  Echocardiogram 2D Echocardiogram has been performed.  Arvil Chaco 05/03/2015, 10:40 AM

## 2015-05-03 NOTE — Progress Notes (Signed)
Utilization review completed.  

## 2015-05-03 NOTE — Care Management Note (Signed)
Case Management Note Donn Pierini RN, BSN Unit 2W-Case Manager [phone]  Patient Details  Name: Mark Roberson MRN: [id number] Date of Birth: [birth year]  Subjective/Objective:     Pt admitted with CHF               Action/Plan: PTA pt lived at home with roommates- pt was active with [facility] for HH-RN/PT/OT- will need resumption orders for [facility] when ready for discharge. NCM to follow  Expected Discharge Date:                  Expected Discharge Plan:  Home/Home Health Services  In-House Referral:     Discharge planning Services  CM Consult  Post Acute Care Choice:  Resumption of Svcs/PTA Provider, Home Health Choice offered to:     DME Arranged:    DME Agency:     HH Arranged:    HH Agency:  [facility]  Status of Service:  In process, will continue to follow  Medicare Important Message Given:    Date Medicare IM Given:    Medicare IM give by:    Date Additional Medicare IM Given:    Additional Medicare Important Message give by:     If discussed at Long Length of Stay Meetings, dates discussed:    Additional Comments:  Darrold Span, RN 05/03/2015, 2:20 PM

## 2015-05-03 NOTE — Evaluation (Signed)
Occupational Therapy Evaluation Patient Details Name: Mark Roberson MRN: [id number] DOB: 10-Jan-1946 Today's Date: 05/03/2015    History of Present Illness Pt is a 69 y.o. male with a PMH of HTN, DM, CHF, a-fib, OSA, asthma, and CKD-III, who presents with worsening LEE, increased body weight and diarrhea. Pt was admitted for a CHF exacerbation.   Clinical Impression   Pt admitted with above. Pt receiving HHOT, PTA and recommend it resume. Will defer all further OT needs to next venue of care.    Follow Up Recommendations  Home health OT    Equipment Recommendations  None recommended by OT    Recommendations for Other Services       Precautions / Restrictions Precautions Precautions: Fall Restrictions Weight Bearing Restrictions: No      Mobility Bed Mobility Overal bed mobility: Modified Independent                Transfers Overall transfer level: Modified independent                    Balance  Supervision for ambulation with RW.                                          ADL Overall ADL's : Needs assistance/impaired                     Lower Body Dressing: Sit to/from stand;Minimal assistance (excluding socks-doesn't wear) Lower Body Dressing Details (indicate cue type and reason): reports he does not wear socks; able to doff socks in session, but not don Toilet Transfer: Ambulation;Supervision/safety;RW   Toileting- Clothing Manipulation and Hygiene: Minimal assistance;Sit to/from stand Toileting - Clothing Manipulation Details (indicate cue type and reason): OT wiped pt's bottom-reported he was dizzy and did not try to reach behind     Functional mobility during ADLs: Supervision/safety;Rolling walker General ADL Comments: Educated on AE, including toilet aide for hygiene and what else he could use for this-also suggested baby wipes. Educated on safety such as use of bag on walker, using shower chair for bathing.  Educated on energy conservation techniques.     Vision     Perception     Praxis      Pertinent Vitals/Pain Pain Assessment: No/denies pain; O2 in 90s on 2L of O2 when checked.     Hand Dominance     Extremity/Trunk Assessment Upper Extremity Assessment Upper Extremity Assessment: RUE deficits/detail;LUE deficits/detail RUE Sensation: decreased light touch LUE Sensation: decreased light touch   Lower Extremity Assessment Lower Extremity Assessment: Defer to PT evaluation       Communication Communication Communication: No difficulties   Cognition Arousal/Alertness: Awake/alert Behavior During Therapy: WFL for tasks assessed/performed Overall Cognitive Status: Within Functional Limits for tasks assessed                     General Comments       Exercises       Shoulder Instructions      Home Living Family/patient expects to be discharged to:: Private residence Living Arrangements: Non-relatives/Friends Available Help at Discharge: Friend(s);Available 24 hours/day Type of Home: House Home Access: Stairs to enter [employer] of Steps: 3 Entrance Stairs-Rails: Right Home Layout: One level     Bathroom Shower/Tub: Tub/shower unit         Home Equipment: Environmental consultant - 2 wheels;Bedside commode;Tub bench;Wheelchair -  manual;Adaptive equipment Adaptive Equipment: Long-handled sponge        Prior Functioning/Environment Level of Independence: Needs assistance  Gait / Transfers Assistance Needed: Pt using a RW at home. Also states he sits in his wheelchair "a lot, depending on the day." ADL's / Homemaking Assistance Needed: assist with toilet hygiene and washing bottom        OT Diagnosis: Other (comment) (Decreased activity tolerance)   OT Problem List: Decreased strength;Decreased range of motion;Obesity;Impaired sensation;Cardiopulmonary status limiting activity;Decreased knowledge of use of DME or AE;Decreased activity  tolerance;Increased edema   OT Treatment/Interventions:      OT Goals(Current goals can be found in the care plan section)    OT Frequency:     Barriers to D/C:            Co-evaluation              End of Session Equipment Utilized During Treatment: Oxygen;Rolling walker  Activity Tolerance: Patient tolerated treatment well Patient left: in bed;with call bell/phone within reach   Time: 1649-1706 OT Time Calculation (min): 17 min Charges:  OT General Charges $OT Visit: 1 Procedure OT Evaluation $Initial OT Evaluation Tier I: 1 Procedure G-CodesEarlie Raveling OTR/L [id number] 05/03/2015, 5:28 PM

## 2015-05-03 NOTE — Progress Notes (Signed)
Spoke with K.Schorr about Lasix  IV that shows late on pt's MAR. Pt states that he did receive this and he did urinate when he arrived to the floor. Schorr advised to not give this dose even though it shows late. We will wait until the next scheduled dose at 0800 this morning.

## 2015-05-03 NOTE — Progress Notes (Signed)
05/03/2015 12:51 PM Pt insists on one of the top side rails to be down.  Encouraged him to keep both up, he states "Im not going to fall out of bed" Kathryne Hitch

## 2015-05-03 NOTE — Evaluation (Signed)
Physical Therapy Evaluation and Discharge Patient Details Name: Mark Roberson MRN: [id number] DOB: September 21, 1946 Today's Date: 05/03/2015   History of Present Illness  Pt is a 69 y/o male with a PMH of HTN, DM, CHF, a-fib, OSA, asthma, and CKD-III, who presents with worsening LEE, increased body weight and diarrhea. Pt was admitted for a CHF exacerbation.  Clinical Impression  Patient evaluated by Physical Therapy with no further acute PT needs identified. All education has been completed and the patient has no further questions. At the time of PT eval pt was able to perform transfers and ambulation with mod I/supervision for lines. Pt anticipates HHPT to resume upon return home (they have been coming out for about a week now). Will defer further skilled therapy to HHPT. See below for any follow-up Physial Therapy or equipment needs. PT is signing off. Thank you for this referral.       Follow Up Recommendations Home health PT    Equipment Recommendations  None recommended by PT    Recommendations for Other Services       Precautions / Restrictions Precautions Precautions: Fall Restrictions Weight Bearing Restrictions: No      Mobility  Bed Mobility Overal bed mobility: Modified Independent                Transfers Overall transfer level: Modified independent Equipment used: Rolling walker (2 wheeled)             General transfer comment: Pt was able to power-up to full standing from EOB as well as from the low toilet seat with use of hand rails.   Ambulation/Gait Ambulation/Gait assistance: Supervision Ambulation Distance (Feet): 75 Feet Assistive device: Rolling walker (2 wheeled) Gait Pattern/deviations: Step-through pattern;Decreased stride length;Wide base of support Gait velocity: Decreased Gait velocity interpretation: Below normal speed for age/gender General Gait Details: Pt only agreeable to ambulate in room, however made several laps totaling around  75'. No physical assist required.   Stairs            Wheelchair Mobility    Modified Rankin (Stroke Patients Only)       Balance Overall balance assessment: Needs assistance Sitting-balance support: Feet supported;No upper extremity supported Sitting balance-Leahy Scale: Good     Standing balance support: No upper extremity supported;During functional activity Standing balance-Leahy Scale: Fair                               Pertinent Vitals/Pain Pain Assessment: No/denies pain    Home Living Family/patient expects to be discharged to:: Private residence Living Arrangements: Non-relatives/Friends Available Help at Discharge: Friend(s);Available 24 hours/day Type of Home: House Home Access: Stairs to enter Entrance Stairs-Rails: Right Entrance Stairs-Number of Steps: 3 Home Layout: One level Home Equipment: Walker - 2 wheels;Bedside commode;Tub bench;Wheelchair - manual      Prior Function Level of Independence: Needs assistance   Gait / Transfers Assistance Needed: Pt using a RW at home. Also states he sits in his wheelchair "a lot, depending on the day."  ADL's / Homemaking Assistance Needed: Pt initially reports that he is independent with ADL's, however later states that he needs assist at home for peri-care. Unsure if there are other activities he is needing assist for and not mentioning during history.        Hand Dominance        Extremity/Trunk Assessment   Upper Extremity Assessment: Overall WFL for tasks assessed  Lower Extremity Assessment: RLE deficits/detail;LLE deficits/detail (Bilateral LEE)      Cervical / Trunk Assessment: Normal  Communication   Communication: No difficulties  Cognition Arousal/Alertness: Awake/alert Behavior During Therapy: WFL for tasks assessed/performed Overall Cognitive Status: Within Functional Limits for tasks assessed                      General Comments       Exercises        Assessment/Plan    PT Assessment All further PT needs can be met in the next venue of care  PT Diagnosis Difficulty walking   PT Problem List    PT Treatment Interventions     PT Goals (Current goals can be found in the Care Plan section) Acute Rehab PT Goals PT Goal Formulation: All assessment and education complete, DC therapy    Frequency     Barriers to discharge        Co-evaluation               End of Session Equipment Utilized During Treatment: Oxygen Activity Tolerance: Patient tolerated treatment well Patient left: in bed;with call bell/phone within reach;Other (comment) (Sitting EOB with lunch) Nurse Communication: Mobility status         Time: 1252-7129 PT Time Calculation (min) (ACUTE ONLY): 23 min   Charges:   PT Evaluation $Initial PT Evaluation Tier I: 1 Procedure PT Treatments $Gait Training: 8-22 mins   PT G Codes:        Rolinda Roan 2015-05-06, 1:39 PM   Rolinda Roan, PT, DPT Acute Rehabilitation Services Pager: [phone]

## 2015-05-03 NOTE — Progress Notes (Signed)
[facility]                                                                              Patient Demographics  Mark Roberson, is a 69 y.o. male, DOB - 06/05/1946, UEA:[id number]  Admit date - 05/02/2015   Admitting Physician Lorretta Harp, MD  Outpatient Primary MD for the patient is [name], MD  LOS - 1   Chief Complaint  Patient presents with  . Edema  . Weight Gain       Brief HPI   Mark Roberson is a 69 y.o. male with PMH of hypertension, diabetes mellitus, diastolic congestive heart failure 9EF 60-65%), atrial fibrillation on Xarelto, OSA, asthma,CKD-III, who presented with worsening leg edema, increased body weight and diarrhea. Patient reported that in the past several days he noticed that his body weight increased from 337 Lbs 04/28/15 to 348 on admission with worsening lower leg edema. Patient has chronic shortness of breath which he feels like at baseline. He had mild cough, but no sputum production, no chest pain. He states that he increased his torsemide from 80 mg in morning and 40 mg in the evening to 80 mg twice a day, but no significant improvement of fluid retention. He also reports that in the past 3 days, he has several episode of diarrhea with loose stool. He has fluctuance with lots of gas. He took Imodium with improved symptoms. He has nausea, but no vomiting. He has mild abdominal pain. He used Abx in the end of June to early of July for pneumonia. Patient does not have symptoms of UTI or unilateral weakness or rashes. In ED, patient was found to have WBC 8.5, worsening renal function, negative troponin, BNP 284, temperature normal, bradycardia. Chest x-ray showed mild congestive heart failure change.   Assessment & Plan   Principal problem Acute on chronic diastolic CHF exacerbation: Significant fluid retention with peripheral edema, elevated BNP 284.3, weight gain previous echo in 2/16 showed EF of 60-65%.Marland Kitchen   -Point of care troponin  0.05, continue IV Lasix, 2-D echo pending. - Continue strict I's and O's and daily weights, cardiology consulted was Dr. Shirlee Latch   Atrial Fibrillation: CHA2DS2-VASc Score is 3, needs oral anticoagulation. Patient is on Xarelto at home. 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 Patient is taking Lantus at home - placed back on NovoLog meal coverage and continue Lantus, sliding scale insulin,    AoCKD-III: Baseline Cre is 1.3-1.8, his Cre is 2.80, BUN 83 on admission. Likely due to cardiorenal syndrome. - follow renal ultrasound, avoid ACEI and NSAIDs -Follow BMET daily    chronic respiratory failure  - Continue O2 via nasal cannula 2 L   Morbid obesity Consultation on diet and weight control  Diarrhea Currently improved, will check C. difficile if any further diarrhea  Code Status:DO NOT RESUSCITATE  Family Communication: Discussed in detail with the patient, all imaging results, lab results explained to the patient    Disposition Plan:   Time Spent in minutes    Procedures  echo  Consults  cards  DVT Prophylaxis  xarelto  Medications  Scheduled Meds: . atorvastatin  20 mg Oral QHS  . carvedilol  3.125 mg Oral BID WC  . cholecalciferol  2,000 Units Oral Daily  . furosemide  80 mg Intravenous Once  . furosemide  80 mg Intravenous BID  . gabapentin  300 mg Oral TID  . insulin aspart  0-9 Units Subcutaneous TID WC  . insulin aspart  30 Units Subcutaneous TID WC  . insulin glargine  40 Units Subcutaneous QHS  . magnesium oxide  400 mg Oral Daily  . multivitamin with minerals  1 tablet Oral Daily  . omega-3 acid ethyl esters  2 g Oral Daily  . Rivaroxaban  15 mg Oral Q breakfast  . sodium chloride  3 mL Intravenous Q12H   Continuous Infusions:  PRN Meds:.sodium chloride, acetaminophen, albuterol, ondansetron (ZOFRAN) IV, sodium chloride   Antibiotics    Anti-infectives    None        Subjective:   Mark Roberson was seen and examined today. Patient denies dizziness, chest pain, abdominal pain, N/V/D/C, new weakness, numbess, tingling. No acute events overnight.    Objective:   Blood pressure 131/59, pulse 51, temperature 97.6 F (36.4 C), temperature source Oral, resp. rate 16, height 6' (1.829 m), weight 155.13 kg (342 lb), SpO2 100 %.  Wt Readings from Last 3 Encounters:  05/03/15 155.13 kg (342 lb)  04/27/15 155.493 kg (342 lb 12.8 oz)  04/12/15 147.419 kg (325 lb)     Intake/Output Summary (Last 24 hours) at 05/03/15 1138 Last data filed at 05/03/15 1124  Gross per 24 hour  Intake      0 ml  Output   3310 ml  Net  -3310 ml    Exam  General: Alert and oriented x 3, NAD  HEENT:  PERRLA, EOMI, Anicteric Sclera, mucous membranes moist.   Neck: Supple, no JVD, no masses  CVS: S1 S2 auscultated,irregularly irregular rhythm  Respiratory : Clear to auscultation bilaterally, no wheezing, rales or rhonchi  Abdomen:  obese,Soft, nontender, nondistended, + bowel sounds  Ext: no cyanosis clubbing3+ pitting edema bilaterally  Neuro: AAOx3, Cr N's II- XII. Strength 5/5 upper and lower extremities bilaterally  Skin: No rashes  Psych: Normal affect and demeanor, alert and oriented x3    Data Review   Micro Results No results found for this or any previous visit (from the past 240 hour(s)).  Radiology Reports Dg Chest 2 View  05/02/2015   CLINICAL DATA:  Edema.  Weight gain.  Fluid retention for 1 week.  EXAM: CHEST  2 VIEW  COMPARISON:  01/11/2015.  FINDINGS: Interstitial pulmonary edema is present with Kerley B lines. The cardiopericardial silhouette is mildly enlarged for projection. Mediastinal contours are within normal limits. No pleural effusion. Respiratory motion is present on the lateral projection.  IMPRESSION: Interstitial pulmonary edema compatible with mild CHF.   Electronically Signed   By: Andreas Newport M.D.   On: 05/02/2015 19:36    CBC  Recent Labs Lab 05/02/15 1906  WBC 8.5  HGB 8.2*  HCT 28.4*  PLT 173  MCV 86.6  MCH 25.0*  MCHC 28.9*  RDW 16.8*    Chemistries   Recent Labs Lab 04/27/15 1100 05/02/15 1906 05/03/15 0516  NA 139 134* 135  K 4.1 4.6 4.2  CL 91* 86* 86*  CO2 41* 36* 36*  GLUCOSE 207* 212* 182*  BUN 49* 83* 85*  CREATININE 2.03* 2.80* 2.50*  CALCIUM 9.3 8.8*  9.1  MG  --   --  2.8*  AST 24  --   --   ALT 16*  --   --   ALKPHOS 74  --   --   BILITOT 0.8  --   --    ------------------------------------------------------------------------------------------------------------------ estimated creatinine clearance is 42.8 mL/min (by C-G formula based on Cr of 2.5). ------------------------------------------------------------------------------------------------------------------ No results for input(s): HGBA1C in the last 72 hours. ------------------------------------------------------------------------------------------------------------------ No results for input(s): CHOL, HDL, LDLCALC, TRIG, CHOLHDL, LDLDIRECT in the last 72 hours. ------------------------------------------------------------------------------------------------------------------ No results for input(s): TSH, T4TOTAL, T3FREE, THYROIDAB in the last 72 hours.  Invalid input(s): FREET3 ------------------------------------------------------------------------------------------------------------------ No results for input(s): VITAMINB12, FOLATE, FERRITIN, TIBC, IRON, RETICCTPCT in the last 72 hours.  Coagulation profile  Recent Labs Lab 05/03/15 0720  INR 1.14    No results for input(s): DDIMER in the last 72 hours.  Cardiac Enzymes No results for input(s): CKMB, TROPONINI, MYOGLOBIN in the last 168 hours.  Invalid input(s): CK ------------------------------------------------------------------------------------------------------------------ Invalid input(s): POCBNP  No  results for input(s): GLUCAP in the last 72 hours.   Ivanna Kocak M.D. [facility] 05/03/2015, 11:38 AM  Pager: [phone] Between 7am to 7pm - call Pager - [phone]  After 7pm go to [email or web address] - password [email or web address]  Call night coverage person covering after 7pm

## 2015-05-04 ENCOUNTER — Inpatient Hospital Stay (HOSPITAL_COMMUNITY): Payer: Medicare Other

## 2015-05-04 LAB — BASIC METABOLIC PANEL
Anion gap: 12 (ref 5–15)
BUN: 87 mg/dL — AB (ref 6–20)
CO2: 39 mmol/L — ABNORMAL HIGH (ref 22–32)
Calcium: 9.1 mg/dL (ref 8.9–10.3)
Chloride: 84 mmol/L — ABNORMAL LOW (ref 101–111)
Creatinine, Ser: 2.2 mg/dL — ABNORMAL HIGH (ref 0.61–1.24)
GFR calc Af Amer: 33 mL/min — ABNORMAL LOW (ref >60)
GFR calc non Af Amer: 29 mL/min — ABNORMAL LOW (ref >60)
Glucose, Bld: 182 mg/dL — ABNORMAL HIGH (ref 65–99)
POTASSIUM: 4.2 mmol/L (ref 3.5–5.1)
SODIUM: 135 mmol/L (ref 135–145)

## 2015-05-04 LAB — GLUCOSE, CAPILLARY
GLUCOSE-CAPILLARY: 167 mg/dL — AB (ref 65–99)
Glucose-Capillary: 39 mg/dL — CL (ref 65–99)
Glucose-Capillary: 68 mg/dL (ref 65–99)
Glucose-Capillary: 109 mg/dL — ABNORMAL HIGH (ref 65–99)
Glucose-Capillary: 287 mg/dL — ABNORMAL HIGH (ref 65–99)
Glucose-Capillary: 158 mg/dL — ABNORMAL HIGH (ref 65–99)

## 2015-05-04 LAB — TROPONIN I: TROPONIN I: 0.05 ng/mL — AB (ref <0.031)

## 2015-05-04 MED ORDER — INSULIN GLARGINE 100 UNIT/ML ~~LOC~~ SOLN
40.0000 [IU] | Freq: Every day | SUBCUTANEOUS | Status: DC
  Administered 2015-05-04 – 2015-05-13 (×10): 40 [IU] via SUBCUTANEOUS
  Filled 2015-05-04 (×11): qty 0.4

## 2015-05-04 MED ORDER — INSULIN ASPART 100 UNIT/ML ~~LOC~~ SOLN: 20.0000 [IU] | Freq: Three times a day (TID) | SUBCUTANEOUS | Status: DC

## 2015-05-04 MED ORDER — GLUCOSE 40 % PO GEL
ORAL | Status: AC
  Administered 2015-05-04: 37.5 g
  Filled 2015-05-04: qty 1

## 2015-05-04 MED ORDER — INSULIN GLARGINE 100 UNIT/ML ~~LOC~~ SOLN
20.0000 [IU] | Freq: Every day | SUBCUTANEOUS | Status: DC
  Filled 2015-05-04: qty 0.2

## 2015-05-04 MED ORDER — INSULIN ASPART 100 UNIT/ML ~~LOC~~ SOLN
15.0000 [IU] | Freq: Three times a day (TID) | SUBCUTANEOUS | Status: DC
  Administered 2015-05-04 – 2015-05-14 (×28): 15 [IU] via SUBCUTANEOUS

## 2015-05-04 NOTE — Progress Notes (Signed)
Hypoglycemic Event  CBG: 35  Treatment: 1 tube instant glucose  Symptoms: Vision changes  Follow-up CBG: Time 1128 CBG Result:68  Possible Reasons for Event: medication regimen: insulin for his meal coverage might have contributed to this even  Comments/MD notified:yes    Mark Roberson  Remember to initiate Hypoglycemia Order Set & complete

## 2015-05-04 NOTE — Progress Notes (Signed)
[facility]                                                                              Patient Demographics  Mark Roberson, is a 69 y.o. male, DOB - [birth year], ZOX:[id number]  Admit date - 05/02/2015   Admitting Physician Lorretta Harp, MD  Outpatient Primary MD for the patient is [name], MD  LOS - 2   Chief Complaint  Patient presents with  . Edema  . Weight Gain       Brief HPI   Mark Roberson is a 69 y.o. male with PMH of hypertension, diabetes mellitus, diastolic congestive heart failure 9EF 60-65%), atrial fibrillation on Xarelto, OSA, asthma,CKD-III, who presented with worsening leg edema, increased body weight and diarrhea. Patient reported that in the past several days he noticed that his body weight increased from 337 Lbs 04/28/15 to 348 on admission with worsening lower leg edema. Patient has chronic shortness of breath which he feels like at baseline. He had mild cough, but no sputum production, no chest pain. He states that he increased his torsemide from 80 mg in morning and 40 mg in the evening to 80 mg twice a day, but no significant improvement of fluid retention. He also reports that in the past 3 days, he has several episode of diarrhea with loose stool. He has fluctuance with lots of gas. He took Imodium with improved symptoms. He has nausea, but no vomiting. He has mild abdominal pain. He used Abx in the end of June to early of July for pneumonia. Patient does not have symptoms of UTI or unilateral weakness or rashes. In ED, patient was found to have WBC 8.5, worsening renal function, negative troponin, BNP 284, temperature normal, bradycardia. Chest x-ray showed mild congestive heart failure change.   Assessment & Plan   Principal problem Acute on chronic diastolic CHF exacerbation: Significant fluid retention with peripheral edema, elevated BNP 284.3, weight gain previous echo in 2/16 showed EF of 60-65%.Marland Kitchen   -Point of care troponin  0.05, continue IV Lasix, - 2-D echo showed EF of 65-70%, normal wall motion. -Negative balance of 5.2 L, weight down from 351 to 337lbs - cardiology following   Atrial Fibrillation: CHA2DS2-VASc Score is 3, needs oral anticoagulation. Patient is on Xarelto at home. -continue Xarelto and coreg  HTN (hypertension): - continue Coreg, and Lasix  DM-II uncontrolled with hypoglycemia issues: Last A1c 7.4 on 03/29/15, blood sugar dropped to 35 today - Decreased the meal coverage, Lantus, continue sliding scale insulin  AoCKD-III: Baseline Cre is 1.3-1.8, his Cre is 2.80, BUN 83 on admission. Likely due to cardiorenal syndrome. - follow renal ultrasound, avoid ACEI and NSAIDs -Follow BMET daily    chronic respiratory failure  - Continue O2 via nasal cannula 2 L   Morbid obesity Consultation on diet and weight control  Diarrhea Currently improved, will check C. difficile if any further diarrhea  Code Status:DO NOT RESUSCITATE  Family Communication: Discussed in detail with the patient, all imaging results, lab results explained to the patient    Disposition Plan:   Time Spent in minutes    Procedures  echo  Consults   cards  DVT Prophylaxis  xarelto  Medications  Scheduled Meds: . atorvastatin  20 mg Oral QHS  . cholecalciferol  2,000 Units Oral Daily  . furosemide  80 mg Intravenous Once  . furosemide  80 mg Intravenous BID  . gabapentin  300 mg Oral TID  . insulin aspart  0-9 Units Subcutaneous TID WC  . insulin aspart  20 Units Subcutaneous TID WC  . insulin glargine  20 Units Subcutaneous QHS  . magnesium oxide  400 mg Oral Daily  . multivitamin with minerals  1 tablet Oral Daily  . omega-3 acid ethyl esters  2 g Oral Daily  . Rivaroxaban  15 mg Oral Q breakfast  . sodium chloride  3 mL Intravenous Q12H   Continuous Infusions:  PRN Meds:.sodium chloride, acetaminophen, albuterol, ondansetron (ZOFRAN) IV, sodium chloride   Antibiotics    Anti-infectives    None        Subjective:   Mark Roberson was seen and examined today. Somewhat sleepy and lethargic today, hypoglycemia issue today, otherwise good urine output. Patient denies dizziness, chest pain, abdominal pain, N/V/D/C, new weakness, numbess, tingling.    Objective:   Blood pressure 108/41, pulse 59, temperature 97.5 F (36.4 C), temperature source Oral, resp. rate 18, height 6' (1.829 m), weight 153.1 kg (337 lb 8.4 oz), SpO2 100 %.  Wt Readings from Last 3 Encounters:  05/04/15 153.1 kg (337 lb 8.4 oz)  04/27/15 155.493 kg (342 lb 12.8 oz)  04/12/15 147.419 kg (325 lb)     Intake/Output Summary (Last 24 hours) at 05/04/15 1255 Last data filed at 05/04/15 0854  Gross per 24 hour  Intake    720 ml  Output   2900 ml  Net  -2180 ml    Exam  General: Sleepy but easily arousable, oriented x 3, NAD  HEENT:  PERRLA, EOMI, Anicteric Sclera, mucous membranes moist.   Neck: Supple, no JVD, no masses  CVS: S1 S2 auscultated,irregularly irregular rhythm  Respiratory : Decreased breath sound at the bases  Abdomen:  obese,Soft, nontender, nondistended, + bowel sounds  Ext: no cyanosis clubbing, 3+ pitting edema bilaterally  Neuro: no new deficits  Skin: No rashes  Psych: Normal affect and demeanor, sleepy but arousable   Data Review   Micro Results Recent Results (from the past 240 hour(s))  MRSA PCR Screening     Status: None   Collection Time: 05/03/15 11:30 AM  Result Value Ref Range Status   MRSA by PCR NEGATIVE NEGATIVE Final    Comment:        The GeneXpert MRSA Assay (FDA approved for NASAL specimens only), is one component of a comprehensive MRSA colonization surveillance program. It is not intended to diagnose MRSA infection nor to guide or monitor treatment for MRSA infections.     Radiology Reports Dg Chest 2 View  05/02/2015   CLINICAL DATA:  Edema.  Weight gain.  Fluid retention for 1 week.  EXAM: CHEST  2 VIEW   COMPARISON:  01/11/2015.  FINDINGS: Interstitial pulmonary edema is present with Kerley B lines. The cardiopericardial silhouette is mildly enlarged for projection. Mediastinal contours are within normal limits. No pleural effusion. Respiratory motion is present on the lateral projection.  IMPRESSION: Interstitial pulmonary edema compatible with mild CHF.   Electronically Signed   By: Andreas Newport M.D.   On: 05/02/2015 19:36   US Renal  05/04/2015   CLINICAL DATA:  Acute renal injury  EXAM: RENAL /  URINARY TRACT ULTRASOUND COMPLETE  COMPARISON:  None.  FINDINGS: Right Kidney:  Length: 11.5 cm. Echogenicity and renal cortical thickness within normal limits. No mass, perinephric fluid, or hydronephrosis visualized. No sonographically demonstrable calculus or ureterectasis.  Left Kidney:  Length: 8.6 cm. Echogenicity within normal limits. There is renal cortical thinning. No mass, perinephric fluid, or hydronephrosis visualized. No sonographically demonstrable calculus or ureterectasis.  Bladder:  Appears normal for degree of bladder distention.  IMPRESSION: Small left kidney with renal cortical thinning compared to the normal-appearing right kidney. This size discrepancy raises question of renal artery stenosis on the left. In this regard, question whether patient is hypertensive. Study otherwise unremarkable.   Electronically Signed   By: Bretta Bang III M.D.   On: 05/04/2015 01:44    CBC  Recent Labs Lab 05/02/15 1906  WBC 8.5  HGB 8.2*  HCT 28.4*  PLT 173  MCV 86.6  MCH 25.0*  MCHC 28.9*  RDW 16.8*    Chemistries   Recent Labs Lab 05/02/15 1906 05/03/15 0516 05/04/15 0206  NA 134* 135 135  K 4.6 4.2 4.2  CL 86* 86* 84*  CO2 36* 36* 39*  GLUCOSE 212* 182* 182*  BUN 83* 85* 87*  CREATININE 2.80* 2.50* 2.20*  CALCIUM 8.8* 9.1 9.1  MG  --  2.8*  --     ------------------------------------------------------------------------------------------------------------------ estimated creatinine clearance is 48.3 mL/min (by C-G formula based on Cr of 2.2). ------------------------------------------------------------------------------------------------------------------ No results for input(s): HGBA1C in the last 72 hours. ------------------------------------------------------------------------------------------------------------------ No results for input(s): CHOL, HDL, LDLCALC, TRIG, CHOLHDL, LDLDIRECT in the last 72 hours. ------------------------------------------------------------------------------------------------------------------ No results for input(s): TSH, T4TOTAL, T3FREE, THYROIDAB in the last 72 hours.  Invalid input(s): FREET3 ------------------------------------------------------------------------------------------------------------------ No results for input(s): VITAMINB12, FOLATE, FERRITIN, TIBC, IRON, RETICCTPCT in the last 72 hours.  Coagulation profile  Recent Labs Lab 05/03/15 0720  INR 1.14    No results for input(s): DDIMER in the last 72 hours.  Cardiac Enzymes  Recent Labs Lab 05/03/15 1237 05/03/15 1930 05/04/15 0206  TROPONINI 0.05* 0.05* 0.05*   ------------------------------------------------------------------------------------------------------------------ Invalid input(s): POCBNP   Recent Labs  05/03/15 1637 05/03/15 2145 05/04/15 0610 05/04/15 1111 05/04/15 1128 05/04/15 1201  GLUCAP 94 169* 167* 39* 68 109*     RAI,RIPUDEEP M.D. [facility] 05/04/2015, 12:55 PM  Pager: [phone] Between 7am to 7pm - call Pager - [phone]  After 7pm go to [email or web address] - password [email or web address]  Call night coverage person covering after 7pm

## 2015-05-04 NOTE — Progress Notes (Signed)
Patient being discontinued from the enteric precaution this afternoon. He had had formed stools this afternoon.

## 2015-05-04 NOTE — Discharge Instructions (Signed)

## 2015-05-04 NOTE — Progress Notes (Signed)
Patient Name: Mark Roberson Date of Encounter: 05/04/2015  Principal Problem:   CHF exacerbation Active Problems:   Morbid obesity   HTN (hypertension)   Atrial flutter   DM (diabetes mellitus), type 2 with renal complications   Anemia, iron deficiency   OSA (obstructive sleep apnea)   Acute renal failure superimposed on stage 3 chronic kidney disease   Diarrhea    SUBJECTIVE  No chest pain. Breathing improved.   CURRENT MEDS . atorvastatin  20 mg Oral QHS  . cholecalciferol  2,000 Units Oral Daily  . dextrose      . furosemide  80 mg Intravenous Once  . furosemide  80 mg Intravenous BID  . gabapentin  300 mg Oral TID  . insulin aspart  0-9 Units Subcutaneous TID WC  . insulin aspart  30 Units Subcutaneous TID WC  . insulin glargine  40 Units Subcutaneous QHS  . magnesium oxide  400 mg Oral Daily  . multivitamin with minerals  1 tablet Oral Daily  . omega-3 acid ethyl esters  2 g Oral Daily  . Rivaroxaban  15 mg Oral Q breakfast  . sodium chloride  3 mL Intravenous Q12H    OBJECTIVE  Filed Vitals:   05/03/15 1457 05/03/15 2143 05/04/15 0450 05/04/15 0539  BP: 94/37 135/56 108/41   Pulse: 52 55 59   Temp: 97.4 F (36.3 C) 97.5 F (36.4 C) 97.5 F (36.4 C)   TempSrc: Axillary Axillary Oral   Resp: Height:      Weight:   338 lb 1.6 oz (153.361 kg) 337 lb 8.4 oz (153.1 kg)  SpO2: 100% 100% 100%     Intake/Output Summary (Last 24 hours) at 05/04/15 1129 Last data filed at 05/04/15 0854  Gross per 24 hour  Intake    963 ml  Output   2900 ml  Net  -1937 ml   Filed Weights   05/03/15 0549 05/04/15 0450 05/04/15 0539  Weight: 342 lb (155.13 kg) 338 lb 1.6 oz (153.361 kg) 337 lb 8.4 oz (153.1 kg)    PHYSICAL EXAM  General: Pleasant, NAD. Neuro: Alert and oriented X 3. Moves all extremities spontaneously. Psych: Normal affect. HEENT:  Normal  Neck: Supple without bruits. + JVD. Lungs:  Resp regular and unlabored, CTA. Heart: RRR no s3, s4,  or murmurs. Abdomen: Soft, non-tender, non-distended, BS + x 4.  Extremities: No clubbing, cyanosis. 1-2+ Le  edema. DP/PT/Radials 2+ and equal bilaterally.  Accessory Clinical Findings  CBC  Recent Labs  05/02/15 1906  WBC 8.5  HGB 8.2*  HCT 28.4*  MCV 86.6  PLT 173   Basic Metabolic Panel  Recent Labs  05/03/15 0516 05/04/15 0206  NA 135 135  K 4.2 4.2  CL 86* 84*  CO2 36* 39*  GLUCOSE 182* 182*  BUN 85* 87*  CREATININE 2.50* 2.20*  CALCIUM 9.1 9.1  MG 2.8*  --    Cardiac Enzymes  Recent Labs  05/03/15 1237 05/03/15 1930 05/04/15 0206  TROPONINI 0.05* 0.05* 0.05*     TELE  NSR  Radiology/Studies  Dg Chest 2 View  05/02/2015   CLINICAL DATA:  Edema.  Weight gain.  Fluid retention for 1 week.  EXAM: CHEST  2 VIEW  COMPARISON:  01/11/2015.  FINDINGS: Interstitial pulmonary edema is present with Kerley B lines. The cardiopericardial silhouette is mildly enlarged for projection. Mediastinal contours are within normal limits. No pleural effusion. Respiratory motion is present on the lateral projection.  IMPRESSION: Interstitial pulmonary edema compatible with mild CHF.   Electronically Signed   By: Andreas Newport M.D.   On: 05/02/2015 19:36   US Renal  05/04/2015   CLINICAL DATA:  Acute renal injury  EXAM: RENAL / URINARY TRACT ULTRASOUND COMPLETE  COMPARISON:  None.  FINDINGS: Right Kidney:  Length: 11.5 cm. Echogenicity and renal cortical thickness within normal limits. No mass, perinephric fluid, or hydronephrosis visualized. No sonographically demonstrable calculus or ureterectasis.  Left Kidney:  Length: 8.6 cm. Echogenicity within normal limits. There is renal cortical thinning. No mass, perinephric fluid, or hydronephrosis visualized. No sonographically demonstrable calculus or ureterectasis.  Bladder:  Appears normal for degree of bladder distention.  IMPRESSION: Small left kidney with renal cortical thinning compared to the normal-appearing right kidney.  This size discrepancy raises question of renal artery stenosis on the left. In this regard, question whether patient is hypertensive. Study otherwise unremarkable.   Electronically Signed   By: Bretta Bang III M.D.   On: 05/04/2015 01:44   Echo 05/03/15 LV EF: 65% -  70%  ------------------------------------------------------------------- History:  PMH: Morbid obesity. CHF-Acute systolic 428.21/150.21. Atrial fibrillation. Atrial flutter. Risk factors: Hypertension. Diabetes mellitus.  ------------------------------------------------------------------- Study Conclusions  - Left ventricle: The cavity size was normal. Wall thickness was normal. Systolic function was vigorous. The estimated ejection fraction was in the range of 65% to 70%. Wall motion was normal; there were no regional wall motion abnormalities. Doppler parameters are consistent with elevated mean left atrial filling pressure. - Aortic valve: Valve area (Vmax): 2.71 cm^2. - Mitral valve: Moderately calcified annulus. There was mild to moderate regurgitation. Valve area by pressure half-time: 1.71 cm^2. Valve area by continuity equation (using LVOT flow): 2.39 cm^2. - Left atrium: The atrium was severely dilated. - Right ventricle: The cavity size was mildly dilated. - Right atrium: The atrium was moderately to severely dilated. - Pulmonary arteries: Systolic pressure was moderately increased. PA peak pressure: 58 mm Hg (S).  ASSESSMENT AND PLAN  1. Acute on chronic diastolic heart failure:  - Patient is markedly volume overloaded.  - Echo showed LV EF of 65-70%, elevated mean left atrial filling pressure. Mild to moderate mitral regur, severely dilated LA, mild dialted RA, PA peak pressure: 58 mm Hg (S). Plan to avoid beta blockers by Dr. Duke Salvia - Most of his LV filling occurs early in diastole, so avoiding bradycardia is key for him. - Continue lasix 80mg  IV bid. Net I/O -3.4L and 5  lb weight loss in last 24 hours. May be able to switch to po tomorrow.  - Patient advised to avoid [place] restaurants and eating out. Consider dietitian consult.  - daily weights and strict I/Os  2. Persistent atrial fibrillation: - Patient's rates are well-controlled.Not on any rate controlling agent. Currently rate in 50-60s.  - Continue Xarelto  3. Bradycardia: - Held carvedilol. He seems minimally symptomatic so no indication for PPM at this time.  5. CAD:  - Currently asymptomatic. Continue statin. Minimally elevated flat troponin. Likely demand in setting of Acute CHF.  - Technically Mr. Aymond has an indication for PPM, as he has a history of CAD and cannot tolerate a beta blocker due to bradycardia. However, this was remote, and he has no angina or symptoms. Will consider this if he has persistently difficult to manage diastolic HF, as I suspect this may be at least partially due to his bradycardia.  6. OSA: Patient has known severe OSA and has not yet been fitted for CPAP.  Recommend that he initiate therapy as soon as possible, as this may help both his heart failure and atrial fibrillation.  Lorelei Pont PA-C Pager [phone] Agree with above assessment and plan. Heart rate currently 55 while he is sleeping. No need for PPM at this time. Continue diuresis.

## 2015-05-04 NOTE — Progress Notes (Signed)
Inpatient Diabetes Program Recommendations  AACE/ADA: New Consensus Statement on Inpatient Glycemic Control (2013)  Target Ranges:  Prepandial:   less than 140 mg/dL      Peak postprandial:   less than 180 mg/dL (1-2 hours)      Critically ill patients:  140 - 180 mg/dL   Reason for Visit: CHF exacerbation  Diabetes history: DM 2 Outpatient Diabetes medications: Lantus 50 units QHS, Novolog 30 units TID Current orders for Inpatient glycemic control: Lantus 20 units, Novolog 20 units TID , Novolog Sensitive correction  Inpatient Diabetes Program Recommendations Insulin - Basal: Fasting glucose 167 this am. Please consider increasing basal insulin back up to 40 units QHS. Insulin - Meal Coverage: Due to the hypoglycemia after meal coverage was given at breakfast this am. Please consider decreasing meal coverage a little more to 15-18 units TID with meals.   Thanks,  Christena Deem RN, MSN, [facility] Inpatient Diabetes Coordinator Team Pager [phone]

## 2015-05-05 LAB — GLUCOSE, CAPILLARY
GLUCOSE-CAPILLARY: 127 mg/dL — AB (ref 65–99)
GLUCOSE-CAPILLARY: 155 mg/dL — AB (ref 65–99)
GLUCOSE-CAPILLARY: 66 mg/dL (ref 65–99)
Glucose-Capillary: 167 mg/dL — ABNORMAL HIGH (ref 65–99)
Glucose-Capillary: 111 mg/dL — ABNORMAL HIGH (ref 65–99)

## 2015-05-05 LAB — BASIC METABOLIC PANEL
Anion gap: 10 (ref 5–15)
BUN: 79 mg/dL — ABNORMAL HIGH (ref 6–20)
CO2: 41 mmol/L — ABNORMAL HIGH (ref 22–32)
Calcium: 9 mg/dL (ref 8.9–10.3)
Chloride: 85 mmol/L — ABNORMAL LOW (ref 101–111)
Creatinine, Ser: 2.12 mg/dL — ABNORMAL HIGH (ref 0.61–1.24)
GFR, EST AFRICAN AMERICAN: 35 mL/min — AB (ref >60)
GFR, EST NON AFRICAN AMERICAN: 30 mL/min — AB (ref >60)
GLUCOSE: 171 mg/dL — AB (ref 65–99)
POTASSIUM: 4 mmol/L (ref 3.5–5.1)
SODIUM: 136 mmol/L (ref 135–145)

## 2015-05-05 MED ORDER — SACCHAROMYCES BOULARDII 250 MG PO CAPS
250.0000 mg | ORAL_CAPSULE | Freq: Two times a day (BID) | ORAL | Status: DC
  Administered 2015-05-05 – 2015-05-14 (×19): 250 mg via ORAL
  Filled 2015-05-05 (×23): qty 1

## 2015-05-05 MED ORDER — FUROSEMIDE 80 MG PO TABS
80.0000 mg | ORAL_TABLET | Freq: Two times a day (BID) | ORAL | Status: DC
  Administered 2015-05-05 – 2015-05-11 (×12): 80 mg via ORAL
  Filled 2015-05-05 (×14): qty 1

## 2015-05-05 NOTE — Progress Notes (Addendum)
Nutrition Consult/Brief Note  RD consulted for CHF diet education.  RD provided "Heart Failure Nutrition Therapy" handout from the Academy of [employer] Diet[employer].  Pt declined education session stating "No, I want to use the bathroom".  Wt Readings from Last 15 Encounters:  05/05/15 335 lb 1.6 oz (152 kg)  04/27/15 342 lb 12.8 oz (155.493 kg)  04/12/15 325 lb (147.419 kg)  03/31/15 325 lb 12.8 oz (147.782 kg)  03/29/15 324 lb (146.965 kg)  03/28/15 328 lb (148.78 kg)  03/07/15 341 lb 6.4 oz (154.858 kg)  02/24/15 339 lb 3.2 oz (153.86 kg)  02/07/15 322 lb (146.058 kg)  02/03/15 326 lb (147.873 kg)  02/03/15 326 lb (147.873 kg)  02/01/15 330 lb (149.687 kg)  01/27/15 331 lb (150.141 kg)  01/18/15 326 lb 1.6 oz (147.918 kg)  12/27/14 332 lb 12.8 oz (150.957 kg)    Body mass index is 45.44 kg/(m^2). Patient meets criteria for Obesity Class III based on current BMI.   Current diet order is Renal/Carbohydrate Modified, patient is consuming approximately 100% of meals at this time. Labs and medications reviewed.   No nutrition interventions warranted at this time. If nutrition issues arise, please consult RD.   Maureen Chatters, RD, LDN Pager #: [phone] After-Hours Pager #: [phone]

## 2015-05-05 NOTE — Progress Notes (Signed)
[facility]                                                                              Patient Demographics  Mark Roberson, is a 69 y.o. male, DOB - July 16, 1946, ZOX:[id number]  Admit date - 05/02/2015   Admitting Physician Lorretta Harp, MD  Outpatient Primary MD for the patient is [name], MD  LOS - 3   Chief Complaint  Patient presents with  . Edema  . Weight Gain       Brief HPI   Mark Roberson is a 69 y.o. male with PMH of hypertension, diabetes mellitus, diastolic congestive heart failure 9EF 60-65%), atrial fibrillation on Xarelto, OSA, asthma,CKD-III, who presented with worsening leg edema, increased body weight and diarrhea. Patient reported that in the past several days he noticed that his body weight increased from 337 Lbs 04/28/15 to 348 on admission with worsening lower leg edema. Patient has chronic shortness of breath which he feels like at baseline. He had mild cough, but no sputum production, no chest pain. He states that he increased his torsemide from 80 mg in morning and 40 mg in the evening to 80 mg twice a day, but no significant improvement of fluid retention. He also reported that in the past 3 days prior to admission, he has several episode of diarrhea with loose stool. He has fluctuance with lots of gas. He took Imodium with improved symptoms. He had mild abdominal pain with nausea but no vomiting. He used Abx in the end of June to early of July for pneumonia. Patient does not have symptoms of UTI or unilateral weakness or rashes. In ED, patient was found to have WBC 8.5, worsening renal function, negative troponin, BNP 284, temperature normal, bradycardia. Chest x-ray showed mild congestive heart failure change.   Assessment & Plan   Principal problem Acute on chronic diastolic CHF exacerbation: Significant fluid retention with peripheral edema, elevated BNP 284.3, weight gain previous echo in 2/16 showed EF of 60-65%.Marland Kitchen   -Point of  care troponin 0.05, continue IV Lasix, - 2-D echo showed EF of 65-70%, normal wall motion. -Negative balance of 8.8 L, weight down from 351 to 335 lbs - cardiology following   Atrial Fibrillation: CHA2DS2-VASc Score is 3, needs oral anticoagulation. Patient is on Xarelto at home. -continue Xarelto and coreg  HTN (hypertension): - continue Coreg, and Lasix  DM-II uncontrolled with hypoglycemia issues: Last A1c 7.4 on 03/29/15, - No further hypoglycemia, continue current regimen  AoCKD-III: Baseline Cre is 1.3-1.8, his Cre is 2.80, BUN 83 on admission. Likely due to cardiorenal syndrome. - Renal ultrasound showed small left kidney with renal cortical thinning compared to normal-appearing right kidney  - avoid ACEI and NSAIDs, creatinine stable and plateaued   chronic respiratory failure  - Continue O2 via nasal cannula 2 L   Morbid obesity Patient was counseled on diet and weight control  Diarrhea Resolved  Code Status:DO NOT RESUSCITATE  Family Communication: Discussed in detail with the patient, all imaging results, lab results explained to the patient    Disposition Plan:   Time Spent in minutes    Procedures  echo  Consults   cards  DVT Prophylaxis  xarelto  Medications  Scheduled Meds: . atorvastatin  20 mg Oral QHS  . cholecalciferol  2,000 Units Oral Daily  . furosemide  80 mg Intravenous BID  . gabapentin  300 mg Oral TID  . insulin aspart  0-9 Units Subcutaneous TID WC  . insulin aspart  15 Units Subcutaneous TID WC  . insulin glargine  40 Units Subcutaneous QHS  . magnesium oxide  400 mg Oral Daily  . multivitamin with minerals  1 tablet Oral Daily  . omega-3 acid ethyl esters  2 g Oral Daily  . Rivaroxaban  15 mg Oral Q breakfast  . saccharomyces boulardii  250 mg Oral BID  . sodium chloride  3 mL Intravenous Q12H   Continuous Infusions:  PRN Meds:.sodium chloride, acetaminophen, albuterol, ondansetron (ZOFRAN) IV, sodium  chloride   Antibiotics   Anti-infectives    None        Subjective:   [name] was seen and examined today. Denied any specific complaints today. No hypoglycemia events. Peripheral edema is improving. Patient denies dizziness, chest pain, abdominal pain, N/V/D/C, new weakness, numbess, tingling.    Objective:   Blood pressure 108/39, pulse 56, temperature 98.1 F (36.7 C), temperature source Oral, resp. rate 20, height 6' (1.829 m), weight 152 kg (335 lb 1.6 oz), SpO2 100 %.  Wt Readings from Last 3 Encounters:  05/05/15 152 kg (335 lb 1.6 oz)  04/27/15 155.493 kg (342 lb 12.8 oz)  04/12/15 147.419 kg (325 lb)     Intake/Output Summary (Last 24 hours) at 05/05/15 1221 Last data filed at 05/05/15 0900  Gross per 24 hour  Intake   1340 ml  Output   4400 ml  Net  -3060 ml    Exam  General: Alert and oriented x 3, NAD  HEENT:  PERRLA, EOMI, Anicteric Sclera, mucous membranes moist.   Neck: Supple, no JVD, no masses  CVS: S1 S2 auscultated,irregularly irregular rhythm  Respiratory : Decreased breath sound at the bases  Abdomen:  obese,Soft, nontender, nondistended, + bowel sounds  Ext: no cyanosis clubbing, 1+ pitting edema bilaterally  Neuro: no new deficits  Skin: No rashes  Psych: Alert and oriented   Data Review   Micro Results Recent Results (from the past 240 hour(s))  MRSA PCR Screening     Status: None   Collection Time: 05/03/15 11:30 AM  Result Value Ref Range Status   MRSA by PCR NEGATIVE NEGATIVE Final    Comment:        The GeneXpert MRSA Assay (FDA approved for NASAL specimens only), is one component of a comprehensive MRSA colonization surveillance program. It is not intended to diagnose MRSA infection nor to guide or monitor treatment for MRSA infections.     Radiology Reports Dg Chest 2 View  05/02/2015   CLINICAL DATA:  Edema.  Weight gain.  Fluid retention for 1 week.  EXAM: CHEST  2 VIEW  COMPARISON:  01/11/2015.   FINDINGS: Interstitial pulmonary edema is present with Kerley B lines. The cardiopericardial silhouette is mildly enlarged for projection. Mediastinal contours are within normal limits. No pleural effusion. Respiratory motion is present on the lateral projection.  IMPRESSION: Interstitial pulmonary edema compatible with mild CHF.   Electronically Signed   By: Andreas Newport M.D.   On: 05/02/2015 19:36   US Renal  05/04/2015   CLINICAL DATA:  Acute renal injury  EXAM: RENAL / URINARY TRACT ULTRASOUND COMPLETE  COMPARISON:  None.  FINDINGS: Right Kidney:  Length: 11.5 cm. Echogenicity and renal cortical thickness within normal limits. No mass, perinephric fluid, or hydronephrosis visualized. No sonographically demonstrable calculus or ureterectasis.  Left Kidney:  Length: 8.6 cm. Echogenicity within normal limits. There is renal cortical thinning. No mass, perinephric fluid, or hydronephrosis visualized. No sonographically demonstrable calculus or ureterectasis.  Bladder:  Appears normal for degree of bladder distention.  IMPRESSION: Small left kidney with renal cortical thinning compared to the normal-appearing right kidney. This size discrepancy raises question of renal artery stenosis on the left. In this regard, question whether patient is hypertensive. Study otherwise unremarkable.   Electronically Signed   By: Bretta Bang III M.D.   On: 05/04/2015 01:44    CBC  Recent Labs Lab 05/02/15 1906  WBC 8.5  HGB 8.2*  HCT 28.4*  PLT 173  MCV 86.6  MCH 25.0*  MCHC 28.9*  RDW 16.8*    Chemistries   Recent Labs Lab 05/02/15 1906 05/03/15 0516 05/04/15 0206 05/05/15 0349  NA 134* 135 135 136  K 4.6 4.2 4.2 4.0  CL 86* 86* 84* 85*  CO2 36* 36* 39* 41*  GLUCOSE 212* 182* 182* 171*  BUN 83* 85* 87* 79*  CREATININE 2.80* 2.50* 2.20* 2.12*  CALCIUM 8.8* 9.1 9.1 9.0  MG  --  2.8*  --   --     ------------------------------------------------------------------------------------------------------------------ estimated creatinine clearance is 50 mL/min (by C-G formula based on Cr of 2.12). ------------------------------------------------------------------------------------------------------------------ No results for input(s): HGBA1C in the last 72 hours. ------------------------------------------------------------------------------------------------------------------ No results for input(s): CHOL, HDL, LDLCALC, TRIG, CHOLHDL, LDLDIRECT in the last 72 hours. ------------------------------------------------------------------------------------------------------------------ No results for input(s): TSH, T4TOTAL, T3FREE, THYROIDAB in the last 72 hours.  Invalid input(s): FREET3 ------------------------------------------------------------------------------------------------------------------ No results for input(s): VITAMINB12, FOLATE, FERRITIN, TIBC, IRON, RETICCTPCT in the last 72 hours.  Coagulation profile  Recent Labs Lab 05/03/15 0720  INR 1.14    No results for input(s): DDIMER in the last 72 hours.  Cardiac Enzymes  Recent Labs Lab 05/03/15 1237 05/03/15 1930 05/04/15 0206  TROPONINI 0.05* 0.05* 0.05*   ------------------------------------------------------------------------------------------------------------------ Invalid input(s): POCBNP   Recent Labs  05/04/15 1128 05/04/15 1201 05/04/15 1652 05/04/15 2114 05/05/15 0600 05/05/15 1114  GLUCAP 68 109* 287* 158* 167* 127*     RAI,RIPUDEEP M.D. [facility] 05/05/2015, 12:21 PM  Pager: [phone] Between 7am to 7pm - call Pager - [phone]  After 7pm go to [email or web address] - password [email or web address]  Call night coverage person covering after 7pm

## 2015-05-05 NOTE — Progress Notes (Signed)
Patient Name: Mark Roberson Date of Encounter: 05/05/2015  Principal Problem:   CHF exacerbation Active Problems:   Morbid obesity   HTN (hypertension)   Atrial flutter   DM (diabetes mellitus), type 2 with renal complications   Anemia, iron deficiency   OSA (obstructive sleep apnea)   Acute renal failure superimposed on stage 3 chronic kidney disease   Diarrhea    SUBJECTIVE  No chest pain. Breathing stable and improved.   CURRENT MEDS . atorvastatin  20 mg Oral QHS  . cholecalciferol  2,000 Units Oral Daily  . furosemide  80 mg Intravenous BID  . gabapentin  300 mg Oral TID  . insulin aspart  0-9 Units Subcutaneous TID WC  . insulin aspart  15 Units Subcutaneous TID WC  . insulin glargine  40 Units Subcutaneous QHS  . magnesium oxide  400 mg Oral Daily  . multivitamin with minerals  1 tablet Oral Daily  . omega-3 acid ethyl esters  2 g Oral Daily  . Rivaroxaban  15 mg Oral Q breakfast  . sodium chloride  3 mL Intravenous Q12H    OBJECTIVE  Filed Vitals:   05/04/15 0539 05/04/15 1531 05/04/15 2022 05/05/15 0437  BP:  135/100 99/45 108/39  Pulse:  56 56 56  Temp:  97.5 F (36.4 C) 98.4 F (36.9 C) 98.1 F (36.7 C)  TempSrc:  Oral Oral Oral  Resp:  20 20 20   Height:      Weight: 337 lb 8.4 oz (153.1 kg)   335 lb 1.6 oz (152 kg)  SpO2:  99% 94% 100%    Intake/Output Summary (Last 24 hours) at 05/05/15 1219 Last data filed at 05/05/15 0900  Gross per 24 hour  Intake   1340 ml  Output   4400 ml  Net  -3060 ml   Filed Weights   05/04/15 0450 05/04/15 0539 05/05/15 0437  Weight: 338 lb 1.6 oz (153.361 kg) 337 lb 8.4 oz (153.1 kg) 335 lb 1.6 oz (152 kg)    PHYSICAL EXAM  General: Pleasant, NAD. Neuro: Alert and oriented X 3. Moves all extremities spontaneously. Psych: Normal affect. HEENT:  Normal  Neck: Supple without bruits. mild JVD. Lungs:  Resp regular and unlabored, CTA. Heart: Irregular. no s3, s4, or murmurs. Abdomen: Soft, non-tender,  non-distended, BS + x 4.  Extremities: No clubbing, cyanosis. Trace to 1+ Le  edema. DP/PT/Radials 2+ and equal bilaterally.  Accessory Clinical Findings  CBC  Recent Labs  05/02/15 1906  WBC 8.5  HGB 8.2*  HCT 28.4*  MCV 86.6  PLT 173   Basic Metabolic Panel  Recent Labs  05/03/15 0516 05/04/15 0206 05/05/15 0349  NA 135 135 136  K 4.2 4.2 4.0  CL 86* 84* 85*  CO2 36* 39* 41*  GLUCOSE 182* 182* 171*  BUN 85* 87* 79*  CREATININE 2.50* 2.20* 2.12*  CALCIUM 9.1 9.1 9.0  MG 2.8*  --   --    Cardiac Enzymes  Recent Labs  05/03/15 1237 05/03/15 1930 05/04/15 0206  TROPONINI 0.05* 0.05* 0.05*     TELE  afib at rate of 50s  Radiology/Studies  Dg Chest 2 View  05/02/2015   CLINICAL DATA:  Edema.  Weight gain.  Fluid retention for 1 week.  EXAM: CHEST  2 VIEW  COMPARISON:  01/11/2015.  FINDINGS: Interstitial pulmonary edema is present with Kerley B lines. The cardiopericardial silhouette is mildly enlarged for projection. Mediastinal contours are within normal limits. No pleural effusion. Respiratory  motion is present on the lateral projection.  IMPRESSION: Interstitial pulmonary edema compatible with mild CHF.   Electronically Signed   By: Andreas Newport M.D.   On: 05/02/2015 19:36   US Renal  05/04/2015   CLINICAL DATA:  Acute renal injury  EXAM: RENAL / URINARY TRACT ULTRASOUND COMPLETE  COMPARISON:  None.  FINDINGS: Right Kidney:  Length: 11.5 cm. Echogenicity and renal cortical thickness within normal limits. No mass, perinephric fluid, or hydronephrosis visualized. No sonographically demonstrable calculus or ureterectasis.  Left Kidney:  Length: 8.6 cm. Echogenicity within normal limits. There is renal cortical thinning. No mass, perinephric fluid, or hydronephrosis visualized. No sonographically demonstrable calculus or ureterectasis.  Bladder:  Appears normal for degree of bladder distention.  IMPRESSION: Small left kidney with renal cortical thinning compared to  the normal-appearing right kidney. This size discrepancy raises question of renal artery stenosis on the left. In this regard, question whether patient is hypertensive. Study otherwise unremarkable.   Electronically Signed   By: Bretta Bang III M.D.   On: 05/04/2015 01:44   Echo 05/03/15 LV EF: 65% -  70%  ------------------------------------------------------------------- History:  PMH: Morbid obesity. CHF-Acute systolic 428.21/150.21. Atrial fibrillation. Atrial flutter. Risk factors: Hypertension. Diabetes mellitus.  ------------------------------------------------------------------- Study Conclusions  - Left ventricle: The cavity size was normal. Wall thickness was normal. Systolic function was vigorous. The estimated ejection fraction was in the range of 65% to 70%. Wall motion was normal; there were no regional wall motion abnormalities. Doppler parameters are consistent with elevated mean left atrial filling pressure. - Aortic valve: Valve area (Vmax): 2.71 cm^2. - Mitral valve: Moderately calcified annulus. There was mild to moderate regurgitation. Valve area by pressure half-time: 1.71 cm^2. Valve area by continuity equation (using LVOT flow): 2.39 cm^2. - Left atrium: The atrium was severely dilated. - Right ventricle: The cavity size was mildly dilated. - Right atrium: The atrium was moderately to severely dilated. - Pulmonary arteries: Systolic pressure was moderately increased. PA peak pressure: 58 mm Hg (S).  ASSESSMENT AND PLAN  1. Acute on chronic diastolic heart failure:  -Appears close to euvolemic. On IV lasix  BID. Net diuresis 8L and 16lb weight loss so far. Consider switch to PO lasix tonight.  - Echo showed LV EF of 65-70%, elevated mean left atrial filling pressure. Mild to moderate mitral regur, severely dilated LA, mild dialted RA, PA peak pressure: 58 mm Hg (S). Plan to avoid beta blockers by Dr. Duke Salvia - Most of his LV  filling occurs early in diastole, so avoiding bradycardia is key for him. - Patient advised to avoid [place] restaurants and eating out.  - daily weights and strict I/Os  2. Persistent atrial fibrillation: - Patient's rates are well-controlled.Not on any rate controlling agent. Currently rate in 50s. - Continue Xarelto  3. Bradycardia: - Held carvedilol. He seems minimally symptomatic so no indication for PPM at this time.  5. CAD:  - Currently asymptomatic. Continue statin. Minimally elevated flat troponin. Likely demand in setting of Acute CHF.   6. OSA:  - Patient has known severe OSA and has not yet been fitted for CPAP. Recommended outpatient evaluation.   Lorelei Pont PA-C Pager [phone] Agree with assessment and plan above. His BP is now low normal after IV diuresis. Can switch to oral lasix tonight. No chest pain. No orthopnea. Lying flat comfortably. His ventricular response of mid 50s is satisfactory off BB.

## 2015-05-05 NOTE — Care Management Important Message (Signed)
Important Message  Patient Details  Name: Mark Roberson MRN: [id number] Date of Birth: [birth year]   Medicare Important Message Given:  [facility] notification given    Kyla Balzarine 05/05/2015, 12:27 PMImportant Message  Patient Details  Name: Mark Roberson MRN: [id number] Date of Birth: [birth year]   Medicare Important Message Given:  Yes-second notification given    Kyla Balzarine 05/05/2015, 12:27 PM

## 2015-05-06 DIAGNOSIS — I5033 Acute on chronic diastolic (congestive) heart failure: Secondary | ICD-10-CM | POA: Diagnosis present

## 2015-05-06 DIAGNOSIS — N183 Chronic kidney disease, stage 3 unspecified: Secondary | ICD-10-CM | POA: Diagnosis present

## 2015-05-06 DIAGNOSIS — I4891 Unspecified atrial fibrillation: Secondary | ICD-10-CM

## 2015-05-06 LAB — IRON AND TIBC
IRON: 26 ug/dL — AB (ref 45–182)
Saturation Ratios: 6 % — ABNORMAL LOW (ref 17.9–39.5)
TIBC: 451 ug/dL — ABNORMAL HIGH (ref 250–450)
UIBC: 425 ug/dL

## 2015-05-06 LAB — CBC
HEMATOCRIT: 28 % — AB (ref 39.0–52.0)
Hemoglobin: 8.3 g/dL — ABNORMAL LOW (ref 13.0–17.0)
MCH: 25.5 pg — ABNORMAL LOW (ref 26.0–34.0)
MCHC: 29.6 g/dL — ABNORMAL LOW (ref 30.0–36.0)
MCV: 86.2 fL (ref 78.0–100.0)
Platelets: 179 10*3/uL (ref 150–400)
RBC: 3.25 MIL/uL — ABNORMAL LOW (ref 4.22–5.81)
RDW: 16.8 % — ABNORMAL HIGH (ref 11.5–15.5)
WBC: 7.7 10*3/uL (ref 4.0–10.5)

## 2015-05-06 LAB — RETICULOCYTES
RBC.: 3.25 MIL/uL — ABNORMAL LOW (ref 4.22–5.81)
RETIC COUNT ABSOLUTE: 84.5 10*3/uL (ref 19.0–186.0)
Retic Ct Pct: 2.6 % (ref 0.4–3.1)

## 2015-05-06 LAB — OCCULT BLOOD X 1 CARD TO LAB, STOOL: Fecal Occult Bld: NEGATIVE

## 2015-05-06 LAB — BASIC METABOLIC PANEL
Anion gap: 13 (ref 5–15)
BUN: 72 mg/dL — ABNORMAL HIGH (ref 6–20)
CO2: 41 mmol/L — ABNORMAL HIGH (ref 22–32)
Calcium: 9 mg/dL (ref 8.9–10.3)
Chloride: 83 mmol/L — ABNORMAL LOW (ref 101–111)
Creatinine, Ser: 1.93 mg/dL — ABNORMAL HIGH (ref 0.61–1.24)
GFR calc Af Amer: 39 mL/min — ABNORMAL LOW (ref >60)
GFR calc non Af Amer: 34 mL/min — ABNORMAL LOW (ref >60)
Glucose, Bld: 69 mg/dL (ref 65–99)
POTASSIUM: 4 mmol/L (ref 3.5–5.1)
Sodium: 137 mmol/L (ref 135–145)

## 2015-05-06 LAB — GLUCOSE, CAPILLARY
Glucose-Capillary: 164 mg/dL — ABNORMAL HIGH (ref 65–99)
Glucose-Capillary: 105 mg/dL — ABNORMAL HIGH (ref 65–99)
Glucose-Capillary: 174 mg/dL — ABNORMAL HIGH (ref 65–99)
Glucose-Capillary: 255 mg/dL — ABNORMAL HIGH (ref 65–99)

## 2015-05-06 LAB — FOLATE: Folate: 39.4 ng/mL (ref >5.9)

## 2015-05-06 LAB — CREATININE, URINE, RANDOM: CREATININE, URINE: 36.1 mg/dL

## 2015-05-06 LAB — VITAMIN B12: Vitamin B-12: 618 pg/mL (ref 180–914)

## 2015-05-06 LAB — FERRITIN: Ferritin: 28 ng/mL (ref 24–336)

## 2015-05-06 NOTE — Progress Notes (Signed)
[facility]                                                                              Patient Demographics  Mark Roberson, is a 69 y.o. male, DOB - 03-10-1946, ZOX:[id number]  Admit date - 05/02/2015   Admitting Physician Lorretta Harp, MD  Outpatient Primary MD for the patient is [name], MD  LOS - 4   Chief Complaint  Patient presents with  . Edema  . Weight Gain       Brief HPI   Mark Roberson is a 69 y.o. male with PMH of hypertension, diabetes mellitus, diastolic congestive heart failure 9EF 60-65%), atrial fibrillation on Xarelto, OSA, asthma,CKD-III, who presented with worsening leg edema, increased body weight and diarrhea. Patient reported that in the past several days he noticed that his body weight increased from 337 Lbs 04/28/15 to 348 on admission with worsening lower leg edema. Patient has chronic shortness of breath which he feels like at baseline. He had mild cough, but no sputum production, no chest pain. He states that he increased his torsemide from 80 mg in morning and 40 mg in the evening to 80 mg twice a day, but no significant improvement of fluid retention. He also reported that in the past 3 days prior to admission, he has several episode of diarrhea with loose stool. He has fluctuance with lots of gas. He took Imodium with improved symptoms. He had mild abdominal pain with nausea but no vomiting. He used Abx in the end of June to early of July for pneumonia. Patient does not have symptoms of UTI or unilateral weakness or rashes. In ED, patient was found to have WBC 8.5, worsening renal function, negative troponin, BNP 284, temperature normal, bradycardia. Chest x-ray showed mild congestive heart failure change.   Assessment & Plan   Principal problem Acute on chronic diastolic CHF exacerbation: Significant fluid retention with peripheral edema, elevated BNP 284.3, weight gain previous echo in 2/16 showed EF of 60-65%.Marland Kitchen   -Point of  care troponin 0.05, continue IV Lasix, - 2-D echo showed EF of 65-70%, normal wall motion. -Negative balance of 12.4 L, weight down from 351 to 333 lbs - cardiology following   Atrial Fibrillation: CHA2DS2-VASc Score is 3, needs oral anticoagulation. Patient is on Xarelto at home. -continue Xarelto and coreg  ? Melanotic stools - Patient reports today that before coming to the hospital this admission, he was having flatulence issues and was taking Pepto-Bismol and then subsequently he noticed darker stools. No BRBPR. He denies taking iron at home. - Will check stool occult test, CBC, anemia panel. For now will continue xarelto.  HTN (hypertension): - continue Coreg, and Lasix  DM-II uncontrolled with hypoglycemia issues: Last A1c 7.4 on 03/29/15, - No further acute hypoglycemia episodes, although one blood sugar reading 66 last night, continue current regimen  AoCKD-III: Baseline Cre is 1.3-1.8, his Cre is 2.80, BUN 83 on admission. Likely due to cardiorenal syndrome. - Renal ultrasound showed small left kidney with renal cortical thinning compared to normal-appearing right kidney  - avoid ACEI and NSAIDs, creatinine stable and plateaued   chronic respiratory  failure  - Continue O2 via nasal cannula 2 L   Morbid obesity Patient was counseled on diet and weight control  Diarrhea Resolved  Code Status:DO NOT RESUSCITATE  Family Communication: Discussed in detail with the patient, all imaging results, lab results explained to the patient    Disposition Plan:   Time Spent in minutes    Procedures  echo  Consults   cards  DVT Prophylaxis  xarelto  Medications  Scheduled Meds: . atorvastatin  20 mg Oral QHS  . cholecalciferol  2,000 Units Oral Daily  . furosemide  80 mg Oral BID  . gabapentin  300 mg Oral TID  . insulin aspart  0-9 Units Subcutaneous TID WC  . insulin aspart  15 Units Subcutaneous TID WC  . insulin glargine  40 Units Subcutaneous QHS  .  magnesium oxide  400 mg Oral Daily  . multivitamin with minerals  1 tablet Oral Daily  . omega-3 acid ethyl esters  2 g Oral Daily  . Rivaroxaban  15 mg Oral Q breakfast  . saccharomyces boulardii  250 mg Oral BID  . sodium chloride  3 mL Intravenous Q12H   Continuous Infusions:  PRN Meds:.sodium chloride, acetaminophen, albuterol, ondansetron (ZOFRAN) IV, sodium chloride   Antibiotics   Anti-infectives    None        Subjective:   Mark Roberson was seen and examined today. Denied any specific complaints today except questionable dark stools prior to the admission. Patient denies dizziness, chest pain, abdominal pain, N/V/D/C, new weakness, numbess, tingling.    Objective:   Blood pressure 133/50, pulse 58, temperature 98.6 F (37 C), temperature source Oral, resp. rate 20, height 6' (1.829 m), weight 151.1 kg (333 lb 1.8 oz), SpO2 98 %.  Wt Readings from Last 3 Encounters:  05/06/15 151.1 kg (333 lb 1.8 oz)  04/27/15 155.493 kg (342 lb 12.8 oz)  04/12/15 147.419 kg (325 lb)     Intake/Output Summary (Last 24 hours) at 05/06/15 1012 Last data filed at 05/06/15 0853  Gross per 24 hour  Intake   1076 ml  Output   4750 ml  Net  -3674 ml    Exam  General: Alert and oriented x 3, NAD  HEENT:  PERRLA, EOMI  Neck: Supple, no JVD, no masses  CVS: S1 S2 auscultated,irregularly irregular rhythm  Respiratory : Decreased breath sound at the bases  Abdomen:  obese,Soft, nontender, nondistended, + bowel sounds  Ext: no cyanosis clubbing, 1+ pitting edema bilaterally (significantly improving)  Neuro: no new deficits  Skin: No rashes  Psych: Alert and orientedx3   Data Review   Micro Results Recent Results (from the past 240 hour(s))  MRSA PCR Screening     Status: None   Collection Time: 05/03/15 11:30 AM  Result Value Ref Range Status   MRSA by PCR NEGATIVE NEGATIVE Final    Comment:        The GeneXpert MRSA Assay (FDA approved for NASAL  specimens only), is one component of a comprehensive MRSA colonization surveillance program. It is not intended to diagnose MRSA infection nor to guide or monitor treatment for MRSA infections.     Radiology Reports Dg Chest 2 View  05/02/2015   CLINICAL DATA:  Edema.  Weight gain.  Fluid retention for 1 week.  EXAM: CHEST  2 VIEW  COMPARISON:  01/11/2015.  FINDINGS: Interstitial pulmonary edema is present with Kerley B lines. The cardiopericardial silhouette is mildly enlarged for projection. Mediastinal contours are within  normal limits. No pleural effusion. Respiratory motion is present on the lateral projection.  IMPRESSION: Interstitial pulmonary edema compatible with mild CHF.   Electronically Signed   By: Andreas Newport M.D.   On: 05/02/2015 19:36   US Renal  05/04/2015   CLINICAL DATA:  Acute renal injury  EXAM: RENAL / URINARY TRACT ULTRASOUND COMPLETE  COMPARISON:  None.  FINDINGS: Right Kidney:  Length: 11.5 cm. Echogenicity and renal cortical thickness within normal limits. No mass, perinephric fluid, or hydronephrosis visualized. No sonographically demonstrable calculus or ureterectasis.  Left Kidney:  Length: 8.6 cm. Echogenicity within normal limits. There is renal cortical thinning. No mass, perinephric fluid, or hydronephrosis visualized. No sonographically demonstrable calculus or ureterectasis.  Bladder:  Appears normal for degree of bladder distention.  IMPRESSION: Small left kidney with renal cortical thinning compared to the normal-appearing right kidney. This size discrepancy raises question of renal artery stenosis on the left. In this regard, question whether patient is hypertensive. Study otherwise unremarkable.   Electronically Signed   By: Bretta Bang III M.D.   On: 05/04/2015 01:44    CBC  Recent Labs Lab 05/02/15 1906  WBC 8.5  HGB 8.2*  HCT 28.4*  PLT 173  MCV 86.6  MCH 25.0*  MCHC 28.9*  RDW 16.8*    Chemistries   Recent Labs Lab  05/02/15 1906 05/03/15 0516 05/04/15 0206 05/05/15 0349  NA 134* 135 135 136  K 4.6 4.2 4.2 4.0  CL 86* 86* 84* 85*  CO2 36* 36* 39* 41*  GLUCOSE 212* 182* 182* 171*  BUN 83* 85* 87* 79*  CREATININE 2.80* 2.50* 2.20* 2.12*  CALCIUM 8.8* 9.1 9.1 9.0  MG  --  2.8*  --   --    ------------------------------------------------------------------------------------------------------------------ estimated creatinine clearance is 49.8 mL/min (by C-G formula based on Cr of 2.12). ------------------------------------------------------------------------------------------------------------------ No results for input(s): HGBA1C in the last 72 hours. ------------------------------------------------------------------------------------------------------------------ No results for input(s): CHOL, HDL, LDLCALC, TRIG, CHOLHDL, LDLDIRECT in the last 72 hours. ------------------------------------------------------------------------------------------------------------------ No results for input(s): TSH, T4TOTAL, T3FREE, THYROIDAB in the last 72 hours.  Invalid input(s): FREET3 ------------------------------------------------------------------------------------------------------------------ No results for input(s): VITAMINB12, FOLATE, FERRITIN, TIBC, IRON, RETICCTPCT in the last 72 hours.  Coagulation profile  Recent Labs Lab 05/03/15 0720  INR 1.14    No results for input(s): DDIMER in the last 72 hours.  Cardiac Enzymes  Recent Labs Lab 05/03/15 1237 05/03/15 1930 05/04/15 0206  TROPONINI 0.05* 0.05* 0.05*   ------------------------------------------------------------------------------------------------------------------ Invalid input(s): POCBNP   Recent Labs  05/05/15 0600 05/05/15 1114 05/05/15 1621 05/05/15 2057 05/05/15 2146 05/06/15 0611  GLUCAP 167* 127* 155* 66 111* 164*     Laverne Hursey M.D. [facility] 05/06/2015, 10:12 AM  Pager: [phone] Between 7am to 7pm  - call Pager - [phone]  After 7pm go to [email or web address] - password [email or web address]  Call night coverage person covering after 7pm

## 2015-05-06 NOTE — Progress Notes (Addendum)
Primary cardiologist: Dr. Marca Ancona  Seen for followup: Diastolic heart failure  Subjective:    No chest pain or palpitations. States that his stools have been dark.  Objective:   Temp:  [97.5 F (36.4 C)-98.6 F (37 C)] 98.6 F (37 C) (08/13 0425) Pulse Rate:  [58-61] 58 (08/13 0425) Resp:  [18-22] 20 (08/13 0425) BP: (114-144)/(50-62) 133/50 mmHg (08/13 0425) SpO2:  [98 %-100 %] 98 % (08/13 0425) FiO2 (%):  [2 %] 2 % (08/12 1400) Weight:  [333 lb 1.8 oz (151.1 kg)] 333 lb 1.8 oz (151.1 kg) (08/13 0425) Last BM Date: 05/05/15  Filed Weights   05/04/15 0539 05/05/15 0437 05/06/15 0425  Weight: 337 lb 8.4 oz (153.1 kg) 335 lb 1.6 oz (152 kg) 333 lb 1.8 oz (151.1 kg)    Intake/Output Summary (Last 24 hours) at 05/06/15 1030 Last data filed at 05/06/15 0853  Gross per 24 hour  Intake   1076 ml  Output   4750 ml  Net  -3674 ml    Telemetry: Atrial fibrillation.  Exam:  General: Morbidly obese, no distress.  Lungs: Decreased breath sounds, no crackles.  Cardiac: Indistinct PMI, irregularly irregular, no S3.  Abdomen: Obese, nontender.  Extremities: Chronic appearing edema.  Lab Results:  Basic Metabolic Panel:  Recent Labs Lab 05/03/15 0516 05/04/15 0206 05/05/15 0349  NA 135 135 136  K 4.2 4.2 4.0  CL 86* 84* 85*  CO2 36* 39* 41*  GLUCOSE 182* 182* 171*  BUN 85* 87* 79*  CREATININE 2.50* 2.20* 2.12*  CALCIUM 9.1 9.1 9.0  MG 2.8*  --   --     CBC:  Recent Labs Lab 05/02/15 1906  WBC 8.5  HGB 8.2*  HCT 28.4*  MCV 86.6  PLT 173    Cardiac Enzymes:  Recent Labs Lab 05/03/15 1237 05/03/15 1930 05/04/15 0206  TROPONINI 0.05* 0.05* 0.05*    Coagulation:  Recent Labs Lab 05/03/15 0720  INR 1.14    Echocardiogram 05/03/2015: Study Conclusions  - Left ventricle: The cavity size was normal. Wall thickness was normal. Systolic function was vigorous. The estimated ejection fraction was in the range of 65% to 70%. Wall  motion was normal; there were no regional wall motion abnormalities. Doppler parameters are consistent with elevated mean left atrial filling pressure. - Aortic valve: Valve area (Vmax): 2.71 cm^2. - Mitral valve: Moderately calcified annulus. There was mild to moderate regurgitation. Valve area by pressure half-time: 1.71 cm^2. Valve area by continuity equation (using LVOT flow): 2.39 cm^2. - Left atrium: The atrium was severely dilated. - Right ventricle: The cavity size was mildly dilated. - Right atrium: The atrium was moderately to severely dilated. - Pulmonary arteries: Systolic pressure was moderately increased. PA peak pressure: 58 mm Hg (S).   Medications:   Scheduled Medications: . atorvastatin  20 mg Oral QHS  . cholecalciferol  2,000 Units Oral Daily  . furosemide  80 mg Oral BID  . gabapentin  300 mg Oral TID  . insulin aspart  0-9 Units Subcutaneous TID WC  . insulin aspart  15 Units Subcutaneous TID WC  . insulin glargine  40 Units Subcutaneous QHS  . magnesium oxide  400 mg Oral Daily  . multivitamin with minerals  1 tablet Oral Daily  . omega-3 acid ethyl esters  2 g Oral Daily  . Rivaroxaban  15 mg Oral Q breakfast  . saccharomyces boulardii  250 mg Oral BID  . sodium chloride  3 mL Intravenous  Q12H     PRN Medications:  sodium chloride, acetaminophen, albuterol, ondansetron (ZOFRAN) IV, sodium chloride   Assessment:   1. Acute on chronic diastolic heart failure. Weight loss and diuresis continues following transition to oral Lasix yesterday. Recent echocardiogram shows LVEF 65-70% with increased filling pressures and moderate to severe pulmonary hypertension.  2. Persistent atrial fibrillation, not requiring any rate lowering medications with bradycardia at baseline. He continues on Xarelto for stroke prophylaxis.  3. OSA, outpatient fitting for CPAP pending.  4. Morbid obesity.  5. Reported dark stools, stool checks pending per  primary team.  6. History of CAD, no active angina symptoms.  7. CKD stage III, creatinine down to 2.1.   Plan/Discussion:    Weight down to 333 pounds, patient reports baseline in the mid 320s. Continue current regimen including oral Lasix. Follow-up BMET in AM.   Jonelle Sidle, M.D., F.A.C.C.

## 2015-05-07 LAB — BASIC METABOLIC PANEL
Anion gap: 13 (ref 5–15)
BUN: 70 mg/dL — AB (ref 6–20)
CO2: 38 mmol/L — AB (ref 22–32)
CREATININE: 1.97 mg/dL — AB (ref 0.61–1.24)
Calcium: 9 mg/dL (ref 8.9–10.3)
Chloride: 85 mmol/L — ABNORMAL LOW (ref 101–111)
GFR calc Af Amer: 38 mL/min — ABNORMAL LOW (ref >60)
GFR calc non Af Amer: 33 mL/min — ABNORMAL LOW (ref >60)
Glucose, Bld: 143 mg/dL — ABNORMAL HIGH (ref 65–99)
Potassium: 4.5 mmol/L (ref 3.5–5.1)
Sodium: 136 mmol/L (ref 135–145)

## 2015-05-07 LAB — CBC
HCT: 26.5 % — ABNORMAL LOW (ref 39.0–52.0)
Hemoglobin: 7.9 g/dL — ABNORMAL LOW (ref 13.0–17.0)
MCH: 25.5 pg — AB (ref 26.0–34.0)
MCHC: 29.8 g/dL — ABNORMAL LOW (ref 30.0–36.0)
MCV: 85.5 fL (ref 78.0–100.0)
Platelets: 177 10*3/uL (ref 150–400)
RBC: 3.1 MIL/uL — ABNORMAL LOW (ref 4.22–5.81)
RDW: 16.9 % — ABNORMAL HIGH (ref 11.5–15.5)
WBC: 8.3 10*3/uL (ref 4.0–10.5)

## 2015-05-07 LAB — GLUCOSE, CAPILLARY
GLUCOSE-CAPILLARY: 146 mg/dL — AB (ref 65–99)
GLUCOSE-CAPILLARY: 164 mg/dL — AB (ref 65–99)
Glucose-Capillary: 85 mg/dL (ref 65–99)
Glucose-Capillary: 95 mg/dL (ref 65–99)

## 2015-05-07 LAB — UREA NITROGEN, URINE: Urea Nitrogen, Ur: 375 mg/dL

## 2015-05-07 NOTE — Progress Notes (Signed)
Primary cardiologist: Dr. Marca Ancona  Seen for followup: Diastolic heart failure  Subjective:    No chest pain or palpitations.  Objective:   Temp:  [97.9 F (36.6 C)-98.3 F (36.8 C)] 97.9 F (36.6 C) (08/14 0355) Pulse Rate:  [56-66] 56 (08/14 0355) Resp:  [16-19] 16 (08/14 0355) BP: (105-118)/(29-44) 118/42 mmHg (08/14 0355) SpO2:  [99 %-100 %] 99 % (08/14 0355) Weight:  [329 lb 11.2 oz (149.551 kg)] 329 lb 11.2 oz (149.551 kg) (08/14 0355) Last BM Date: 05/06/15  Filed Weights   05/05/15 0437 05/06/15 0425 05/07/15 0355  Weight: 335 lb 1.6 oz (152 kg) 333 lb 1.8 oz (151.1 kg) 329 lb 11.2 oz (149.551 kg)    Intake/Output Summary (Last 24 hours) at 05/07/15 1031 Last data filed at 05/07/15 0700  Gross per 24 hour  Intake    702 ml  Output   4850 ml  Net  -4148 ml    Telemetry: Atrial fibrillation.  Exam:  General: Morbidly obese, no distress.  Lungs: Decreased breath sounds, no crackles.  Cardiac: Indistinct PMI, irregularly irregular, no S3.  Abdomen: Obese, nontender.  Extremities: Chronic appearing edema.  Lab Results:  Basic Metabolic Panel:  Recent Labs Lab 05/03/15 0516  05/05/15 0349 05/06/15 1223 05/07/15 0353  NA 135  < > 136 137 136  K 4.2  < > 4.0 4.0 4.5  CL 86*  < > 85* 83* 85*  CO2 36*  < > 41* 41* 38*  GLUCOSE 182*  < > 171* 69 143*  BUN 85*  < > 79* 72* 70*  CREATININE 2.50*  < > 2.12* 1.93* 1.97*  CALCIUM 9.1  < > 9.0 9.0 9.0  MG 2.8*  --   --   --   --   < > = values in this interval not displayed.  CBC:  Recent Labs Lab 05/02/15 1906 05/06/15 1223 05/07/15 0353  WBC 8.5 7.7 8.3  HGB 8.2* 8.3* 7.9*  HCT 28.4* 28.0* 26.5*  MCV 86.6 86.2 85.5  PLT 173 179 177    Cardiac Enzymes:  Recent Labs Lab 05/03/15 1237 05/03/15 1930 05/04/15 0206  TROPONINI 0.05* 0.05* 0.05*    Coagulation:  Recent Labs Lab 05/03/15 0720  INR 1.14    Echocardiogram 05/03/2015: Study Conclusions  - Left ventricle:  The cavity size was normal. Wall thickness was normal. Systolic function was vigorous. The estimated ejection fraction was in the range of 65% to 70%. Wall motion was normal; there were no regional wall motion abnormalities. Doppler parameters are consistent with elevated mean left atrial filling pressure. - Aortic valve: Valve area (Vmax): 2.71 cm^2. - Mitral valve: Moderately calcified annulus. There was mild to moderate regurgitation. Valve area by pressure half-time: 1.71 cm^2. Valve area by continuity equation (using LVOT flow): 2.39 cm^2. - Left atrium: The atrium was severely dilated. - Right ventricle: The cavity size was mildly dilated. - Right atrium: The atrium was moderately to severely dilated. - Pulmonary arteries: Systolic pressure was moderately increased. PA peak pressure: 58 mm Hg (S).   Medications:   Scheduled Medications: . atorvastatin  20 mg Oral QHS  . cholecalciferol  2,000 Units Oral Daily  . furosemide  80 mg Oral BID  . gabapentin  300 mg Oral TID  . insulin aspart  0-9 Units Subcutaneous TID WC  . insulin aspart  15 Units Subcutaneous TID WC  . insulin glargine  40 Units Subcutaneous QHS  . magnesium oxide  400 mg Oral  Daily  . multivitamin with minerals  1 tablet Oral Daily  . omega-3 acid ethyl esters  2 g Oral Daily  . Rivaroxaban  15 mg Oral Q breakfast  . saccharomyces boulardii  250 mg Oral BID  . sodium chloride  3 mL Intravenous Q12H    PRN Medications: sodium chloride, acetaminophen, albuterol, ondansetron (ZOFRAN) IV, sodium chloride   Assessment:   1. Acute on chronic diastolic heart failure. Weight loss and diuresis continues following transition to oral Lasix. Recent echocardiogram shows LVEF 65-70% with increased filling pressures and moderate to severe pulmonary hypertension.  2. Persistent atrial fibrillation, not requiring any rate lowering medications with bradycardia at baseline. He continues on Xarelto for  stroke prophylaxis.  3. OSA, outpatient fitting for CPAP pending.  4. Morbid obesity.  5. Reported dark stools, guiac negative. Anemia noted with Hgb 7.9. Evaluation per primary team.  6. History of CAD, no active angina symptoms.  7. CKD stage III, creatinine down to 1.9.   Plan/Discussion:    Weight down to 329 pounds, patient reports baseline in the mid 320s. Continue current regimen including oral Lasix. Follow-up BMET in AM.   Jonelle Sidle, M.D., F.A.C.C.

## 2015-05-07 NOTE — Progress Notes (Signed)
[facility]                                                                              Patient Demographics  Mark Roberson, is a 69 y.o. male, DOB - [birth year], NFA:[id number]  Admit date - 05/02/2015   Admitting Physician Lorretta Harp, MD  Outpatient Primary MD for the patient is [name], MD  LOS - 5   Chief Complaint  Patient presents with  . Edema  . Weight Gain       Brief HPI   Mark Roberson is a 69 y.o. male with PMH of hypertension, diabetes mellitus, diastolic congestive heart failure 9EF 60-65%), atrial fibrillation on Xarelto, OSA, asthma,CKD-III, who presented with worsening leg edema, increased body weight and diarrhea. Patient reported that in the past several days he noticed that his body weight increased from 337 Lbs 04/28/15 to 348 on admission with worsening lower leg edema. Patient has chronic shortness of breath which he feels like at baseline. He had mild cough, but no sputum production, no chest pain. He states that he increased his torsemide from 80 mg in morning and 40 mg in the evening to 80 mg twice a day, but no significant improvement of fluid retention. He also reported that in the past 3 days prior to admission, he has several episode of diarrhea with loose stool. He has fluctuance with lots of gas. He took Imodium with improved symptoms. He had mild abdominal pain with nausea but no vomiting. He used Abx in the end of June to early of July for pneumonia. Patient does not have symptoms of UTI or unilateral weakness or rashes. In ED, patient was found to have WBC 8.5, worsening renal function, negative troponin, BNP 284, temperature normal, bradycardia. Chest x-ray showed mild congestive heart failure change.   Assessment & Plan   Principal problem Acute on chronic diastolic CHF exacerbation: Significant fluid retention with peripheral edema, elevated BNP 284.3, weight gain previous echo in 2/16 showed EF of 60-65%.Marland Kitchen   -Point of  care troponin 0.05, continue IV Lasix, - 2-D echo showed EF of 65-70%, normal wall motion. -Negative balance of 16.6, weight down from 351 to 329 lbs. Her patient's dry weight is around 324-325 lbs - cardiology following   Atrial Fibrillation: CHA2DS2-VASc Score is 3, needs oral anticoagulation. Patient is on Xarelto at home. -continue Xarelto and coreg  ? Melanotic stools - Patient reports today that before coming to the hospital this admission, he was having flatulence issues and was taking Pepto-Bismol and then subsequently he noticed darker stools. No BRBPR. He denies taking iron at home. - Stool occult test negative, anemia panel with iron deficiency anemia, hemoglobin 7.9 (from 8.3 yesterday). No active bleeding, if hemoglobin less than 7.9 tomorrow, will transfuse 1 unit. No obvious GI bleeding. Patient recommended outpatient GI follow-up.   HTN (hypertension): - continue Coreg, and Lasix  DM-II uncontrolled with hypoglycemia issues: Last A1c 7.4 on 03/29/15, - No further acute hypoglycemia episodes, continue current regimen  AoCKD-III: Baseline Cre is 1.3-1.8, his Cre is 2.80, BUN 83 on admission. Likely due to cardiorenal syndrome. - Renal ultrasound showed small left  kidney with renal cortical thinning compared to normal-appearing right kidney  - avoid ACEI and NSAIDs, creatinine stable and plateaued   chronic respiratory failure  - Continue O2 via nasal cannula 2 L   Morbid obesity Patient was counseled on diet and weight control  Diarrhea Resolved  Code Status:DO NOT RESUSCITATE  Family Communication: Discussed in detail with the patient, all imaging results, lab results explained to the patient    Disposition Plan: Hopefully DC in 24-48 hours  Time Spent in minutes    Procedures  echo  Consults   cards  DVT Prophylaxis  xarelto  Medications  Scheduled Meds: . atorvastatin  20 mg Oral QHS  . cholecalciferol  2,000 Units Oral Daily  .  furosemide  80 mg Oral BID  . gabapentin  300 mg Oral TID  . insulin aspart  0-9 Units Subcutaneous TID WC  . insulin aspart  15 Units Subcutaneous TID WC  . insulin glargine  40 Units Subcutaneous QHS  . magnesium oxide  400 mg Oral Daily  . multivitamin with minerals  1 tablet Oral Daily  . omega-3 acid ethyl esters  2 g Oral Daily  . Rivaroxaban  15 mg Oral Q breakfast  . saccharomyces boulardii  250 mg Oral BID  . sodium chloride  3 mL Intravenous Q12H   Continuous Infusions:  PRN Meds:.sodium chloride, acetaminophen, albuterol, ondansetron (ZOFRAN) IV, sodium chloride   Antibiotics   Anti-infectives    None        Subjective:   Mark Roberson was seen and examined today. No complaints today. No abdominal pain, no active GI bleeding.  Patient denies dizziness, chest pain, abdominal pain, N/V/D/C, new weakness, numbess, tingling.    Objective:   Blood pressure 118/42, pulse 56, temperature 97.9 F (36.6 C), temperature source Oral, resp. rate 16, height 6' (1.829 m), weight 149.551 kg (329 lb 11.2 oz), SpO2 99 %.  Wt Readings from Last 3 Encounters:  05/07/15 149.551 kg (329 lb 11.2 oz)  04/27/15 155.493 kg (342 lb 12.8 oz)  04/12/15 147.419 kg (325 lb)     Intake/Output Summary (Last 24 hours) at 05/07/15 1017 Last data filed at 05/07/15 0700  Gross per 24 hour  Intake    702 ml  Output   4850 ml  Net  -4148 ml    Exam  General: Alert and oriented x 3, NAD  HEENT:  PERRLA, EOMI  Neck: Supple, no JVD, no masses  CVS: S1 S2 clear, ireg ireg rhythm  Respiratory : Decreased breath sound at the bases  Abdomen:  Obese,Soft,NT, ND, NBS  Ext: no cyanosis clubbing, trace - 1+ edema  Neuro: no new deficits  Skin: No rashes  Psych: Alert and orientedx3   Data Review   Micro Results Recent Results (from the past 240 hour(s))  MRSA PCR Screening     Status: None   Collection Time: 05/03/15 11:30 AM  Result Value Ref Range Status   MRSA by PCR  NEGATIVE NEGATIVE Final    Comment:        The GeneXpert MRSA Assay (FDA approved for NASAL specimens only), is one component of a comprehensive MRSA colonization surveillance program. It is not intended to diagnose MRSA infection nor to guide or monitor treatment for MRSA infections.     Radiology Reports Dg Chest 2 View  05/02/2015   CLINICAL DATA:  Edema.  Weight gain.  Fluid retention for 1 week.  EXAM: CHEST  2 VIEW  COMPARISON:  01/11/2015.  FINDINGS: Interstitial pulmonary edema is present with Kerley B lines. The cardiopericardial silhouette is mildly enlarged for projection. Mediastinal contours are within normal limits. No pleural effusion. Respiratory motion is present on the lateral projection.  IMPRESSION: Interstitial pulmonary edema compatible with mild CHF.   Electronically Signed   By: Andreas Newport M.D.   On: 05/02/2015 19:36   US Renal  05/04/2015   CLINICAL DATA:  Acute renal injury  EXAM: RENAL / URINARY TRACT ULTRASOUND COMPLETE  COMPARISON:  None.  FINDINGS: Right Kidney:  Length: 11.5 cm. Echogenicity and renal cortical thickness within normal limits. No mass, perinephric fluid, or hydronephrosis visualized. No sonographically demonstrable calculus or ureterectasis.  Left Kidney:  Length: 8.6 cm. Echogenicity within normal limits. There is renal cortical thinning. No mass, perinephric fluid, or hydronephrosis visualized. No sonographically demonstrable calculus or ureterectasis.  Bladder:  Appears normal for degree of bladder distention.  IMPRESSION: Small left kidney with renal cortical thinning compared to the normal-appearing right kidney. This size discrepancy raises question of renal artery stenosis on the left. In this regard, question whether patient is hypertensive. Study otherwise unremarkable.   Electronically Signed   By: Bretta Bang III M.D.   On: 05/04/2015 01:44    CBC  Recent Labs Lab 05/02/15 1906 05/06/15 1223 05/07/15 0353  WBC 8.5 7.7 8.3   HGB 8.2* 8.3* 7.9*  HCT 28.4* 28.0* 26.5*  PLT 173 179 177  MCV 86.6 86.2 85.5  MCH 25.0* 25.5* 25.5*  MCHC 28.9* 29.6* 29.8*  RDW 16.8* 16.8* 16.9*    Chemistries   Recent Labs Lab 05/03/15 0516 05/04/15 0206 05/05/15 0349 05/06/15 1223 05/07/15 0353  NA 135 135 136 137 136  K 4.2 4.2 4.0 4.0 4.5  CL 86* 84* 85* 83* 85*  CO2 36* 39* 41* 41* 38*  GLUCOSE 182* 182* 171* 69 143*  BUN 85* 87* 79* 72* 70*  CREATININE 2.50* 2.20* 2.12* 1.93* 1.97*  CALCIUM 9.1 9.1 9.0 9.0 9.0  MG 2.8*  --   --   --   --    ------------------------------------------------------------------------------------------------------------------ estimated creatinine clearance is 53.3 mL/min (by C-G formula based on Cr of 1.97). ------------------------------------------------------------------------------------------------------------------ No results for input(s): HGBA1C in the last 72 hours. ------------------------------------------------------------------------------------------------------------------ No results for input(s): CHOL, HDL, LDLCALC, TRIG, CHOLHDL, LDLDIRECT in the last 72 hours. ------------------------------------------------------------------------------------------------------------------ No results for input(s): TSH, T4TOTAL, T3FREE, THYROIDAB in the last 72 hours.  Invalid input(s): FREET3 ------------------------------------------------------------------------------------------------------------------  Recent Labs  05/06/15 1030 05/06/15 1223  VITAMINB12  --  618  FOLATE 39.4  --   FERRITIN  --  28  TIBC  --  451*  IRON  --  26*  RETICCTPCT  --  2.6    Coagulation profile  Recent Labs Lab 05/03/15 0720  INR 1.14    No results for input(s): DDIMER in the last 72 hours.  Cardiac Enzymes  Recent Labs Lab 05/03/15 1237 05/03/15 1930 05/04/15 0206  TROPONINI 0.05* 0.05* 0.05*    ------------------------------------------------------------------------------------------------------------------ Invalid input(s): POCBNP   Recent Labs  05/05/15 2146 05/06/15 0611 05/06/15 1116 05/06/15 1629 05/06/15 2119 05/07/15 0622  GLUCAP 111* 164* 105* 174* 255* 146*     RAI,RIPUDEEP M.D. [facility] 05/07/2015, 10:17 AM  Pager: [phone] Between 7am to 7pm - call Pager - [phone]  After 7pm go to [email or web address] - password [email or web address]  Call night coverage person covering after 7pm

## 2015-05-07 NOTE — Progress Notes (Signed)
Pt was instructed on need for telemetry monitoring and that he should wash up at the bedside or sink rather than taking a shower.  Pt stated he would do what he wanted to do.

## 2015-05-08 DIAGNOSIS — N179 Acute kidney failure, unspecified: Secondary | ICD-10-CM

## 2015-05-08 DIAGNOSIS — D509 Iron deficiency anemia, unspecified: Secondary | ICD-10-CM

## 2015-05-08 LAB — CBC WITH DIFFERENTIAL/PLATELET
BASOS ABS: 0 10*3/uL (ref 0.0–0.1)
Basophils Relative: 0 % (ref 0–1)
EOS ABS: 1.2 10*3/uL — AB (ref 0.0–0.7)
EOS PCT: 15 % — AB (ref 0–5)
HCT: 30.6 % — ABNORMAL LOW (ref 39.0–52.0)
Hemoglobin: 9 g/dL — ABNORMAL LOW (ref 13.0–17.0)
LYMPHS PCT: 12 % (ref 12–46)
Lymphs Abs: 1 10*3/uL (ref 0.7–4.0)
MCH: 25.5 pg — ABNORMAL LOW (ref 26.0–34.0)
MCHC: 29.4 g/dL — ABNORMAL LOW (ref 30.0–36.0)
MCV: 86.7 fL (ref 78.0–100.0)
Monocytes Absolute: 1.2 10*3/uL — ABNORMAL HIGH (ref 0.1–1.0)
Monocytes Relative: 14 % — ABNORMAL HIGH (ref 3–12)
Neutro Abs: 4.7 10*3/uL (ref 1.7–7.7)
Neutrophils Relative %: 59 % (ref 43–77)
PLATELETS: 180 10*3/uL (ref 150–400)
RBC: 3.53 MIL/uL — AB (ref 4.22–5.81)
RDW: 16.6 % — ABNORMAL HIGH (ref 11.5–15.5)
WBC: 8.2 10*3/uL (ref 4.0–10.5)

## 2015-05-08 LAB — BASIC METABOLIC PANEL
ANION GAP: 10 (ref 5–15)
BUN: 63 mg/dL — ABNORMAL HIGH (ref 6–20)
CO2: 41 mmol/L — ABNORMAL HIGH (ref 22–32)
Calcium: 8.8 mg/dL — ABNORMAL LOW (ref 8.9–10.3)
Chloride: 85 mmol/L — ABNORMAL LOW (ref 101–111)
Creatinine, Ser: 1.83 mg/dL — ABNORMAL HIGH (ref 0.61–1.24)
GFR calc Af Amer: 42 mL/min — ABNORMAL LOW (ref >60)
GFR, EST NON AFRICAN AMERICAN: 36 mL/min — AB (ref >60)
Glucose, Bld: 147 mg/dL — ABNORMAL HIGH (ref 65–99)
POTASSIUM: 3.8 mmol/L (ref 3.5–5.1)
SODIUM: 136 mmol/L (ref 135–145)

## 2015-05-08 LAB — CBC
HEMATOCRIT: 27.2 % — AB (ref 39.0–52.0)
HEMOGLOBIN: 7.9 g/dL — AB (ref 13.0–17.0)
MCH: 25.2 pg — ABNORMAL LOW (ref 26.0–34.0)
MCHC: 29 g/dL — ABNORMAL LOW (ref 30.0–36.0)
MCV: 86.6 fL (ref 78.0–100.0)
Platelets: 193 10*3/uL (ref 150–400)
RBC: 3.14 MIL/uL — ABNORMAL LOW (ref 4.22–5.81)
RDW: 17 % — ABNORMAL HIGH (ref 11.5–15.5)
WBC: 9.5 10*3/uL (ref 4.0–10.5)

## 2015-05-08 LAB — GLUCOSE, CAPILLARY
GLUCOSE-CAPILLARY: 213 mg/dL — AB (ref 65–99)
Glucose-Capillary: 163 mg/dL — ABNORMAL HIGH (ref 65–99)
Glucose-Capillary: 179 mg/dL — ABNORMAL HIGH (ref 65–99)
Glucose-Capillary: 144 mg/dL — ABNORMAL HIGH (ref 65–99)

## 2015-05-08 LAB — PREPARE RBC (CROSSMATCH)

## 2015-05-08 MED ORDER — FUROSEMIDE 10 MG/ML IJ SOLN
20.0000 mg | Freq: Once | INTRAMUSCULAR | Status: AC
  Administered 2015-05-08: 20 mg via INTRAVENOUS
  Filled 2015-05-08: qty 2

## 2015-05-08 MED ORDER — SODIUM CHLORIDE 0.9 % IV SOLN
Freq: Once | INTRAVENOUS | Status: AC
  Administered 2015-05-08: 12:00:00 via INTRAVENOUS

## 2015-05-08 NOTE — Progress Notes (Signed)
[facility]                                                                              Patient Demographics  Mark Roberson, is a 69 y.o. male, DOB - 05-25-1946, ZOX:[id number]  Admit date - 05/02/2015   Admitting Physician Lorretta Harp, MD  Outpatient Primary MD for the patient is [name], MD  LOS - 6   Chief Complaint  Patient presents with  . Edema  . Weight Gain       Brief HPI   Mark Roberson is a 69 y.o. male with PMH of hypertension, diabetes mellitus, diastolic congestive heart failure 9EF 60-65%), atrial fibrillation on Xarelto, OSA, asthma,CKD-III, who presented with worsening leg edema, increased body weight and diarrhea. Patient reported that in the past several days he noticed that his body weight increased from 337 Lbs 04/28/15 to 348 on admission with worsening lower leg edema. Patient has chronic shortness of breath which he feels like at baseline. He had mild cough, but no sputum production, no chest pain. He states that he increased his torsemide from 80 mg in morning and 40 mg in the evening to 80 mg twice a day, but no significant improvement of fluid retention. He also reported that in the past 3 days prior to admission, he has several episode of diarrhea with loose stool. He has fluctuance with lots of gas. He took Imodium with improved symptoms. He had mild abdominal pain with nausea but no vomiting. He used Abx in the end of June to early of July for pneumonia. Patient does not have symptoms of UTI or unilateral weakness or rashes. In ED, patient was found to have WBC 8.5, worsening renal function, negative troponin, BNP 284, temperature normal, bradycardia. Chest x-ray showed mild congestive heart failure change.   Assessment & Plan   Principal problem Acute on chronic diastolic CHF exacerbation: Significant fluid retention with peripheral edema, elevated BNP 284.3, weight gain previous echo in 2/16 showed EF of 60-65%.Marland Kitchen   -Point of  care troponin 0.05, continue IV Lasix, - 2-D echo showed EF of 65-70%, normal wall motion. -Negative balance of 19.6 L, weight down from 351 to 326 lbs. Her patient's dry weight is around 324-325 lbs - cardiology following   Atrial Fibrillation: CHA2DS2-VASc Score is 3, needs oral anticoagulation. Patient is on Xarelto at home. -continue Xarelto and coreg  ? Melanotic stools/anemia of chronic disease with iron deficiency, no active bleeding, stool occult test negative. Patient reports internal hemorrhoids as well. - Patient reported on 8/13 that before coming to the hospital this admission, he was having flatulence issues and was taking Pepto-Bismol and then subsequently he noticed darker stools. No BRBPR. He denies taking iron at home. - Stool occult test negative, anemia panel with iron deficiency anemia, hemoglobin 7.9. - Transfuse 1 unit. No obvious GI bleeding - Outpatient GI workup, will arrange appointment when patient is ready to be discharged   HTN (hypertension): - continue Coreg, and Lasix  DM-II uncontrolled with hypoglycemia issues: Last A1c 7.4 on 03/29/15, - No further acute hypoglycemia episodes, continue current regimen  AoCKD-III: Baseline Cre is 1.3-1.8, his  Cre is 2.80, BUN 83 on admission. Likely due to cardiorenal syndrome. - Renal ultrasound showed small left kidney with renal cortical thinning compared to normal-appearing right kidney  - avoid ACEI and NSAIDs, creatinine stable and plateaued   chronic respiratory failure  - Continue O2 via nasal cannula 2 L   Morbid obesity Patient was counseled on diet and weight control  Diarrhea Resolved  Code Status:DO NOT RESUSCITATE  Family Communication: Discussed in detail with the patient, all imaging results, lab results explained to the patient    Disposition Plan: Hopefully DC in a.m.  Time Spent in minutes    Procedures  echo  Consults   cards  DVT Prophylaxis   xarelto  Medications  Scheduled Meds: . sodium chloride   Intravenous Once  . atorvastatin  20 mg Oral QHS  . cholecalciferol  2,000 Units Oral Daily  . furosemide  20 mg Intravenous Once  . furosemide  80 mg Oral BID  . gabapentin  300 mg Oral TID  . insulin aspart  0-9 Units Subcutaneous TID WC  . insulin aspart  15 Units Subcutaneous TID WC  . insulin glargine  40 Units Subcutaneous QHS  . magnesium oxide  400 mg Oral Daily  . multivitamin with minerals  1 tablet Oral Daily  . omega-3 acid ethyl esters  2 g Oral Daily  . Rivaroxaban  15 mg Oral Q breakfast  . saccharomyces boulardii  250 mg Oral BID  . sodium chloride  3 mL Intravenous Q12H   Continuous Infusions:  PRN Meds:.sodium chloride, acetaminophen, albuterol, ondansetron (ZOFRAN) IV, sodium chloride   Antibiotics   Anti-infectives    None        Subjective:   Mark Roberson was seen and examined today. No complaints, no active GI bleeding. No chest pain or shortness of breath. No acute events overnight.  Patient denies dizziness, chest pain, abdominal pain, N/V/D/C, new weakness, numbess, tingling.    Objective:   Blood pressure 120/44, pulse 61, temperature 98.6 F (37 C), temperature source Axillary, resp. rate 16, height 6' (1.829 m), weight 148.099 kg (326 lb 8 oz), SpO2 99 %.  Wt Readings from Last 3 Encounters:  05/08/15 148.099 kg (326 lb 8 oz)  04/27/15 155.493 kg (342 lb 12.8 oz)  04/12/15 147.419 kg (325 lb)     Intake/Output Summary (Last 24 hours) at 05/08/15 1203 Last data filed at 05/08/15 1127  Gross per 24 hour  Intake    760 ml  Output   3200 ml  Net  -2440 ml    Exam  General: Alert and oriented x 3, NAD  HEENT:  PERRLA, EOMI  Neck: Supple, no JVD, no masses  CVS: S1 S2 clear, ireg ireg rhythm  Respiratory : CTAB  Abdomen:  Obese,Soft,NT, ND, NBS  Ext: no c/c, trace - 1+ edema  Neuro: no new deficits  Skin: No rashes  Psych: Alert and orientedx3, normal  demeanor and affect   Data Review   Micro Results Recent Results (from the past 240 hour(s))  MRSA PCR Screening     Status: None   Collection Time: 05/03/15 11:30 AM  Result Value Ref Range Status   MRSA by PCR NEGATIVE NEGATIVE Final    Comment:        The GeneXpert MRSA Assay (FDA approved for NASAL specimens only), is one component of a comprehensive MRSA colonization surveillance program. It is not intended to diagnose MRSA infection nor to guide or monitor treatment for MRSA  infections.     Radiology Reports Dg Chest 2 View  05/02/2015   CLINICAL DATA:  Edema.  Weight gain.  Fluid retention for 1 week.  EXAM: CHEST  2 VIEW  COMPARISON:  01/11/2015.  FINDINGS: Interstitial pulmonary edema is present with Kerley B lines. The cardiopericardial silhouette is mildly enlarged for projection. Mediastinal contours are within normal limits. No pleural effusion. Respiratory motion is present on the lateral projection.  IMPRESSION: Interstitial pulmonary edema compatible with mild CHF.   Electronically Signed   By: Andreas Newport M.D.   On: 05/02/2015 19:36   US Renal  05/04/2015   CLINICAL DATA:  Acute renal injury  EXAM: RENAL / URINARY TRACT ULTRASOUND COMPLETE  COMPARISON:  None.  FINDINGS: Right Kidney:  Length: 11.5 cm. Echogenicity and renal cortical thickness within normal limits. No mass, perinephric fluid, or hydronephrosis visualized. No sonographically demonstrable calculus or ureterectasis.  Left Kidney:  Length: 8.6 cm. Echogenicity within normal limits. There is renal cortical thinning. No mass, perinephric fluid, or hydronephrosis visualized. No sonographically demonstrable calculus or ureterectasis.  Bladder:  Appears normal for degree of bladder distention.  IMPRESSION: Small left kidney with renal cortical thinning compared to the normal-appearing right kidney. This size discrepancy raises question of renal artery stenosis on the left. In this regard, question whether  patient is hypertensive. Study otherwise unremarkable.   Electronically Signed   By: Bretta Bang III M.D.   On: 05/04/2015 01:44    CBC  Recent Labs Lab 05/02/15 1906 05/06/15 1223 05/07/15 0353 05/08/15 0420  WBC 8.5 7.7 8.3 9.5  HGB 8.2* 8.3* 7.9* 7.9*  HCT 28.4* 28.0* 26.5* 27.2*  PLT 173 179 177 193  MCV 86.6 86.2 85.5 86.6  MCH 25.0* 25.5* 25.5* 25.2*  MCHC 28.9* 29.6* 29.8* 29.0*  RDW 16.8* 16.8* 16.9* 17.0*    Chemistries   Recent Labs Lab 05/03/15 0516 05/04/15 0206 05/05/15 0349 05/06/15 1223 05/07/15 0353 05/08/15 0420  NA 135 135 136 137 136 136  K 4.2 4.2 4.0 4.0 4.5 3.8  CL 86* 84* 85* 83* 85* 85*  CO2 36* 39* 41* 41* 38* 41*  GLUCOSE 182* 182* 171* 69 143* 147*  BUN 85* 87* 79* 72* 70* 63*  CREATININE 2.50* 2.20* 2.12* 1.93* 1.97* 1.83*  CALCIUM 9.1 9.1 9.0 9.0 9.0 8.8*  MG 2.8*  --   --   --   --   --    ------------------------------------------------------------------------------------------------------------------ estimated creatinine clearance is 57 mL/min (by C-G formula based on Cr of 1.83). ------------------------------------------------------------------------------------------------------------------ No results for input(s): HGBA1C in the last 72 hours. ------------------------------------------------------------------------------------------------------------------ No results for input(s): CHOL, HDL, LDLCALC, TRIG, CHOLHDL, LDLDIRECT in the last 72 hours. ------------------------------------------------------------------------------------------------------------------ No results for input(s): TSH, T4TOTAL, T3FREE, THYROIDAB in the last 72 hours.  Invalid input(s): FREET3 ------------------------------------------------------------------------------------------------------------------  Recent Labs  05/06/15 1030 05/06/15 1223  VITAMINB12  --  618  FOLATE 39.4  --   FERRITIN  --  28  TIBC  --  451*  IRON  --  26*  RETICCTPCT   --  2.6    Coagulation profile  Recent Labs Lab 05/03/15 0720  INR 1.14    No results for input(s): DDIMER in the last 72 hours.  Cardiac Enzymes  Recent Labs Lab 05/03/15 1237 05/03/15 1930 05/04/15 0206  TROPONINI 0.05* 0.05* 0.05*   ------------------------------------------------------------------------------------------------------------------ Invalid input(s): POCBNP   Recent Labs  05/07/15 0622 05/07/15 1115 05/07/15 1622 05/07/15 2211 05/08/15 0622 05/08/15 1126  GLUCAP 146* 164* 85 95 163* 179*  Burna Atlas M.D. [facility] 05/08/2015, 12:03 PM  Pager: [phone] Between 7am to 7pm - call Pager - [phone]  After 7pm go to [email or web address] - password [email or web address]  Call night coverage person covering after 7pm

## 2015-05-08 NOTE — Care Management Note (Signed)
Case Management Note Donn Pierini RN, BSN Unit 2W-Case Manager [phone]  Patient Details  Name: Mark Roberson MRN: [id number] Date of Birth: December 08, 1945  Subjective/Objective:     Pt admitted with CHF               Action/Plan: PTA pt lived at home with roommates- pt was active with [facility] for HH-RN/PT/OT- will need resumption orders for [facility] when ready for discharge. NCM to follow  Expected Discharge Date:                  Expected Discharge Plan:  Home/Home Health Services  In-House Referral:     Discharge planning Services  CM Consult  Post Acute Care Choice:  Resumption of Svcs/PTA Provider, Home Health Choice offered to:     DME Arranged:    DME Agency:     HH Arranged:    HH Agency:  [facility]  Status of Service:  In process, will continue to follow  Medicare Important Message Given:  Yes-second notification given Date Medicare IM Given:    Medicare IM give by:    Date Additional Medicare IM Given:    Additional Medicare Important Message give by:     If discussed at Long Length of Stay Meetings, dates discussed:  05/09/15  Additional Comments:  05/08/15- pt hgb 7.9- receiving 2u PRBC today, plan remains to return home with [facility]- will need resumption orders at discharge. NCM to continue to follow  Zenda Alpers Lenn Sink, RN 05/08/2015, 10:23 AM

## 2015-05-08 NOTE — Progress Notes (Signed)
Utilization review completed.  

## 2015-05-08 NOTE — Progress Notes (Signed)
SUBJECTIVE:  Feels well.  OBJECTIVE:   Vitals:   Filed Vitals:   05/07/15 0355 05/07/15 1321 05/07/15 1956 05/08/15 0532  BP: 118/42 121/46 114/52 127/55  Pulse: 56 53 49 68  Temp: 97.9 F (36.6 C) 97.8 F (36.6 C) 97.7 F (36.5 C) 98.6 F (37 C)  TempSrc: Oral Oral Axillary Oral  Resp: Height:      Weight: 329 lb 11.2 oz (149.551 kg)   326 lb 8 oz (148.099 kg)  SpO2: 99% 100% 100% 98%   I&O's:   Intake/Output Summary (Last 24 hours) at 05/08/15 1011 Last data filed at 05/08/15 0536  Gross per 24 hour  Intake    640 ml  Output   3500 ml  Net  -2860 ml   TELEMETRY: Reviewed telemetry pt in AFib:     PHYSICAL EXAM General: Well developed, well nourished, in no acute distress Head:   Normal cephalic and atramatic  Lungs:   Clear bilaterally to auscultation. Heart:  Irregualrly irregular S1 S2  No JVD.   Abdomen: abdomen soft and non-tender Msk:  Back normal,  Normal strength and tone for age. Extremities:  1+ bilateral edema.   Neuro: Alert and oriented. Psych:  Normal affect, responds appropriately Skin: No rash   LABS: Basic Metabolic Panel:  Recent Labs  16/10/96 0353 05/08/15 0420  NA 136 136  K 4.5 3.8  CL 85* 85*  CO2 38* 41*  GLUCOSE 143* 147*  BUN 70* 63*  CREATININE 1.97* 1.83*  CALCIUM 9.0 8.8*   Liver Function Tests: No results for input(s): AST, ALT, ALKPHOS, BILITOT, PROT, ALBUMIN in the last 72 hours. No results for input(s): LIPASE, AMYLASE in the last 72 hours. CBC:  Recent Labs  05/07/15 0353 05/08/15 0420  WBC 8.3 9.5  HGB 7.9* 7.9*  HCT 26.5* 27.2*  MCV 85.5 86.6  PLT 177 193   Cardiac Enzymes: No results for input(s): CKTOTAL, CKMB, CKMBINDEX, TROPONINI in the last 72 hours. BNP: Invalid input(s): POCBNP D-Dimer: No results for input(s): DDIMER in the last 72 hours. Hemoglobin A1C: No results for input(s): HGBA1C in the last 72 hours. Fasting Lipid Panel: No results for input(s): CHOL, HDL, LDLCALC,  TRIG, CHOLHDL, LDLDIRECT in the last 72 hours. Thyroid Function Tests: No results for input(s): TSH, T4TOTAL, T3FREE, THYROIDAB in the last 72 hours.  Invalid input(s): FREET3 Anemia Panel:  Recent Labs  05/06/15 1030 05/06/15 1223  VITAMINB12  --  618  FOLATE 39.4  --   FERRITIN  --  28  TIBC  --  451*  IRON  --  26*  RETICCTPCT  --  2.6   Coag Panel:   Lab Results  Component Value Date   INR 1.14 05/03/2015    RADIOLOGY: Dg Chest 2 View  05/02/2015   CLINICAL DATA:  Edema.  Weight gain.  Fluid retention for 1 week.  EXAM: CHEST  2 VIEW  COMPARISON:  01/11/2015.  FINDINGS: Interstitial pulmonary edema is present with Kerley B lines. The cardiopericardial silhouette is mildly enlarged for projection. Mediastinal contours are within normal limits. No pleural effusion. Respiratory motion is present on the lateral projection.  IMPRESSION: Interstitial pulmonary edema compatible with mild CHF.   Electronically Signed   By: Andreas Newport M.D.   On: 05/02/2015 19:36   US Renal  05/04/2015   CLINICAL DATA:  Acute renal injury  EXAM: RENAL / URINARY TRACT ULTRASOUND COMPLETE  COMPARISON:  None.  FINDINGS: Right Kidney:  Length: 11.5 cm. Echogenicity and renal cortical thickness within normal limits. No mass, perinephric fluid, or hydronephrosis visualized. No sonographically demonstrable calculus or ureterectasis.  Left Kidney:  Length: 8.6 cm. Echogenicity within normal limits. There is renal cortical thinning. No mass, perinephric fluid, or hydronephrosis visualized. No sonographically demonstrable calculus or ureterectasis.  Bladder:  Appears normal for degree of bladder distention.  IMPRESSION: Small left kidney with renal cortical thinning compared to the normal-appearing right kidney. This size discrepancy raises question of renal artery stenosis on the left. In this regard, question whether patient is hypertensive. Study otherwise unremarkable.   Electronically Signed   By: Bretta Bang III M.D.   On: 05/04/2015 01:44      ASSESSMENT: Roosvelt Harps:   1) Acute on chronic diastolic heart failure: Continue current dose of diuretics. He is diuresed well. He is on his baseline amount of oxygen.  2) anemia: He will be receiving a blood transfusion today. Unclear etiology.  3) chronic renal insufficiency: Improving. Currently on lower dose xarelto and for atrial fibrillation. If his renal function improves, may need to increase the dose.  Corky Crafts, MD  05/08/2015  10:11 AM

## 2015-05-08 NOTE — Care Management Important Message (Signed)
Important Message  Patient Details  Name: Mark Roberson MRN: [id number] Date of Birth: [birth year]   Medicare Important Message Given:  Yes-third notification given    Kyla Balzarine 05/08/2015, 11:19 AMImportant Message  Patient Details  Name: Mark Roberson MRN: [id number] Date of Birth: [birth year]   Medicare Important Message Given:  Yes-third notification given    Kyla Balzarine 05/08/2015, 11:19 AM

## 2015-05-09 DIAGNOSIS — I2510 Atherosclerotic heart disease of native coronary artery without angina pectoris: Secondary | ICD-10-CM

## 2015-05-09 DIAGNOSIS — Z9861 Coronary angioplasty status: Secondary | ICD-10-CM

## 2015-05-09 LAB — CBC
HEMATOCRIT: 31.7 % — AB (ref 39.0–52.0)
HEMOGLOBIN: 9.4 g/dL — AB (ref 13.0–17.0)
MCH: 25.6 pg — ABNORMAL LOW (ref 26.0–34.0)
MCHC: 29.7 g/dL — ABNORMAL LOW (ref 30.0–36.0)
MCV: 86.4 fL (ref 78.0–100.0)
PLATELETS: 188 10*3/uL (ref 150–400)
RBC: 3.67 MIL/uL — AB (ref 4.22–5.81)
RDW: 16.6 % — ABNORMAL HIGH (ref 11.5–15.5)
WBC: 8.7 10*3/uL (ref 4.0–10.5)

## 2015-05-09 LAB — GLUCOSE, CAPILLARY
GLUCOSE-CAPILLARY: 209 mg/dL — AB (ref 65–99)
GLUCOSE-CAPILLARY: 137 mg/dL — AB (ref 65–99)
GLUCOSE-CAPILLARY: 201 mg/dL — AB (ref 65–99)
Glucose-Capillary: 181 mg/dL — ABNORMAL HIGH (ref 65–99)

## 2015-05-09 LAB — BASIC METABOLIC PANEL
ANION GAP: 12 (ref 5–15)
BUN: 59 mg/dL — ABNORMAL HIGH (ref 6–20)
CO2: 38 mmol/L — AB (ref 22–32)
Calcium: 9 mg/dL (ref 8.9–10.3)
Chloride: 85 mmol/L — ABNORMAL LOW (ref 101–111)
Creatinine, Ser: 1.8 mg/dL — ABNORMAL HIGH (ref 0.61–1.24)
GFR calc Af Amer: 43 mL/min — ABNORMAL LOW (ref >60)
GFR, EST NON AFRICAN AMERICAN: 37 mL/min — AB (ref >60)
GLUCOSE: 212 mg/dL — AB (ref 65–99)
POTASSIUM: 3.8 mmol/L (ref 3.5–5.1)
Sodium: 135 mmol/L (ref 135–145)

## 2015-05-09 LAB — TYPE AND SCREEN
ABO/RH(D): A NEG
ANTIBODY SCREEN: NEGATIVE
Unit division: 0

## 2015-05-09 NOTE — Progress Notes (Signed)
[facility]                                                                              Patient Demographics  Mark Roberson, is a 69 y.o. male, DOB - April 23, 1946, ZOX:[id number]  Admit date - 05/02/2015   Admitting Physician Lorretta Harp, MD  Outpatient Primary MD for the patient is [name], MD  LOS - 7   Chief Complaint  Patient presents with  . Edema  . Weight Gain       Brief HPI   Mark Roberson is a 69 y.o. male with PMH of hypertension, diabetes mellitus, diastolic congestive heart failure 9EF 60-65%), atrial fibrillation on Xarelto, OSA, asthma,CKD-III, who presented with worsening leg edema, increased body weight and diarrhea. Patient reported that in the past several days he noticed that his body weight increased from 337 Lbs 04/28/15 to 348 on admission with worsening lower leg edema. Patient has chronic shortness of breath which he feels like at baseline. He had mild cough, but no sputum production, no chest pain. He states that he increased his torsemide from 80 mg in morning and 40 mg in the evening to 80 mg twice a day, but no significant improvement of fluid retention. He also reported that in the past 3 days prior to admission, he has several episode of diarrhea with loose stool. He has fluctuance with lots of gas. He took Imodium with improved symptoms. He had mild abdominal pain with nausea but no vomiting. He used Abx in the end of June to early of July for pneumonia. Patient does not have symptoms of UTI or unilateral weakness or rashes. In ED, patient was found to have WBC 8.5, worsening renal function, negative troponin, BNP 284, temperature normal, bradycardia. Chest x-ray showed mild congestive heart failure change.   Assessment & Plan   Principal problem Acute on chronic diastolic CHF exacerbation: Significant fluid retention with peripheral edema, elevated BNP 284.3, weight gain previous echo in 2/16 showed EF of 60-65%.Marland Kitchen   -Point of  care troponin 0.05, continue IV Lasix, - 2-D echo showed EF of 65-70%, normal wall motion. -Negative balance of 21.6 L, weight down from 351 to 324lbs. Her patient's dry weight is around 324-325 lbs - cardiology following   Atrial Fibrillation: CHA2DS2-VASc Score is 3, needs oral anticoagulation. Patient is on Xarelto at home. -continue Xarelto and coreg  ? Melanotic stools/anemia of chronic disease with iron deficiency, no active bleeding, stool occult test negative. Patient reports internal hemorrhoids as well. - Patient reported on 8/13 that before coming to the hospital this admission, he was having flatulence issues and was taking Pepto-Bismol and then subsequently he noticed darker stools. No BRBPR. He denies taking iron at home. - Stool occult test negative, anemia panel with iron deficiency anemia - Transfused 1 unit on 8/15 for hemoglobin of 7.9, currently 9.4. No obvious GI bleeding - Outpatient GI workup, will arrange appointment when patient is ready to be discharged   HTN (hypertension): - continue Coreg, and Lasix  DM-II uncontrolled with hypoglycemia issues: Last A1c 7.4 on 03/29/15, - No further acute hypoglycemia episodes, continue current regimen  AoCKD-III:  Baseline Cre is 1.3-1.8, his Cre is 2.80, BUN 83 on admission. Likely due to cardiorenal syndrome. - Renal ultrasound showed small left kidney with renal cortical thinning compared to normal-appearing right kidney  - avoid ACEI and NSAIDs, creatinine stable and plateaued   chronic respiratory failure  - Continue O2 via nasal cannula 2 L   Morbid obesity Patient was counseled on diet and weight control  Diarrhea Resolved  Generalized debility Obtain PT evaluation, may need rehabilitation. Fall in the bathroom today with maximal assistance.  Code Status:DO NOT RESUSCITATE  Family Communication: Discussed in detail with the patient, all imaging results, lab results explained to the patient    Disposition  Plan: Hopefully DC next 24-48 hours.  Time Spent in minutes    Procedures  echo  Consults   cards  DVT Prophylaxis  xarelto  Medications  Scheduled Meds: . atorvastatin  20 mg Oral QHS  . cholecalciferol  2,000 Units Oral Daily  . furosemide  80 mg Oral BID  . gabapentin  300 mg Oral TID  . insulin aspart  0-9 Units Subcutaneous TID WC  . insulin aspart  15 Units Subcutaneous TID WC  . insulin glargine  40 Units Subcutaneous QHS  . magnesium oxide  400 mg Oral Daily  . multivitamin with minerals  1 tablet Oral Daily  . omega-3 acid ethyl esters  2 g Oral Daily  . Rivaroxaban  15 mg Oral Q breakfast  . saccharomyces boulardii  250 mg Oral BID  . sodium chloride  3 mL Intravenous Q12H   Continuous Infusions:  PRN Meds:.sodium chloride, acetaminophen, albuterol, ondansetron (ZOFRAN) IV, sodium chloride   Antibiotics   Anti-infectives    None        Subjective:   Mark Roberson was seen and examined today. Feels very weak and deconditioned, peripheral edema is improving. No hematochezia melena, nausea and vomiting and hematemesis.  Patient denies dizziness, chest pain, abdominal pain, N/V/D/C, new weakness, numbess, tingling.    Objective:   Blood pressure 130/44, pulse 71, temperature 98.7 F (37.1 C), temperature source Oral, resp. rate 16, height 6' (1.829 m), weight 147.4 kg (324 lb 15.3 oz), SpO2 99 %.  Wt Readings from Last 3 Encounters:  05/09/15 147.4 kg (324 lb 15.3 oz)  04/27/15 155.493 kg (342 lb 12.8 oz)  04/12/15 147.419 kg (325 lb)     Intake/Output Summary (Last 24 hours) at 05/09/15 1135 Last data filed at 05/09/15 0900  Gross per 24 hour  Intake   2184 ml  Output   4200 ml  Net  -2016 ml    Exam  General: Alert and oriented x 3, NAD  HEENT:  PERRLA, EOMI  Neck: Supple, no JVD  CVS: S1 S2 clear, ireg ireg rhythm  Respiratory : Minimal expiratory wheezing  Abdomen:  Obese,Soft,NT, ND, NBS  Ext: no c/c, trace - 1+  edema  Neuro: no new deficits  Skin: No rashes  Psych: Alert and orientedx3, normal demeanor and affect   Data Review   Micro Results Recent Results (from the past 240 hour(s))  MRSA PCR Screening     Status: None   Collection Time: 05/03/15 11:30 AM  Result Value Ref Range Status   MRSA by PCR NEGATIVE NEGATIVE Final    Comment:        The GeneXpert MRSA Assay (FDA approved for NASAL specimens only), is one component of a comprehensive MRSA colonization surveillance program. It is not intended to diagnose MRSA infection nor to  guide or monitor treatment for MRSA infections.     Radiology Reports Dg Chest 2 View  05/02/2015   CLINICAL DATA:  Edema.  Weight gain.  Fluid retention for 1 week.  EXAM: CHEST  2 VIEW  COMPARISON:  01/11/2015.  FINDINGS: Interstitial pulmonary edema is present with Kerley B lines. The cardiopericardial silhouette is mildly enlarged for projection. Mediastinal contours are within normal limits. No pleural effusion. Respiratory motion is present on the lateral projection.  IMPRESSION: Interstitial pulmonary edema compatible with mild CHF.   Electronically Signed   By: Andreas Newport M.D.   On: 05/02/2015 19:36   US Renal  05/04/2015   CLINICAL DATA:  Acute renal injury  EXAM: RENAL / URINARY TRACT ULTRASOUND COMPLETE  COMPARISON:  None.  FINDINGS: Right Kidney:  Length: 11.5 cm. Echogenicity and renal cortical thickness within normal limits. No mass, perinephric fluid, or hydronephrosis visualized. No sonographically demonstrable calculus or ureterectasis.  Left Kidney:  Length: 8.6 cm. Echogenicity within normal limits. There is renal cortical thinning. No mass, perinephric fluid, or hydronephrosis visualized. No sonographically demonstrable calculus or ureterectasis.  Bladder:  Appears normal for degree of bladder distention.  IMPRESSION: Small left kidney with renal cortical thinning compared to the normal-appearing right kidney. This size discrepancy  raises question of renal artery stenosis on the left. In this regard, question whether patient is hypertensive. Study otherwise unremarkable.   Electronically Signed   By: Bretta Bang III M.D.   On: 05/04/2015 01:44    CBC  Recent Labs Lab 05/06/15 1223 05/07/15 0353 05/08/15 0420 05/08/15 2049 05/09/15 0620  WBC 7.7 8.3 9.5 8.2 8.7  HGB 8.3* 7.9* 7.9* 9.0* 9.4*  HCT 28.0* 26.5* 27.2* 30.6* 31.7*  PLT 179 177 193 180 188  MCV 86.2 85.5 86.6 86.7 86.4  MCH 25.5* 25.5* 25.2* 25.5* 25.6*  MCHC 29.6* 29.8* 29.0* 29.4* 29.7*  RDW 16.8* 16.9* 17.0* 16.6* 16.6*  LYMPHSABS  --   --   --  1.0  --   MONOABS  --   --   --  1.2*  --   EOSABS  --   --   --  1.2*  --   BASOSABS  --   --   --  0.0  --     Chemistries   Recent Labs Lab 05/03/15 0516  05/05/15 0349 05/06/15 1223 05/07/15 0353 05/08/15 0420 05/09/15 0620  NA 135  < > 136 137 136 136 135  K 4.2  < > 4.0 4.0 4.5 3.8 3.8  CL 86*  < > 85* 83* 85* 85* 85*  CO2 36*  < > 41* 41* 38* 41* 38*  GLUCOSE 182*  < > 171* 69 143* 147* 212*  BUN 85*  < > 79* 72* 70* 63* 59*  CREATININE 2.50*  < > 2.12* 1.93* 1.97* 1.83* 1.80*  CALCIUM 9.1  < > 9.0 9.0 9.0 8.8* 9.0  MG 2.8*  --   --   --   --   --   --   < > = values in this interval not displayed. ------------------------------------------------------------------------------------------------------------------ estimated creatinine clearance is 57.8 mL/min (by C-G formula based on Cr of 1.8). ------------------------------------------------------------------------------------------------------------------ No results for input(s): HGBA1C in the last 72 hours. ------------------------------------------------------------------------------------------------------------------ No results for input(s): CHOL, HDL, LDLCALC, TRIG, CHOLHDL, LDLDIRECT in the last 72  hours. ------------------------------------------------------------------------------------------------------------------ No results for input(s): TSH, T4TOTAL, T3FREE, THYROIDAB in the last 72 hours.  Invalid input(s): FREET3 ------------------------------------------------------------------------------------------------------------------  Recent Labs  05/06/15 1223  QBHALPFX90  618  FERRITIN 28  TIBC 451*  IRON 26*  RETICCTPCT 2.6    Coagulation profile  Recent Labs Lab 05/03/15 0720  INR 1.14    No results for input(s): DDIMER in the last 72 hours.  Cardiac Enzymes  Recent Labs Lab 05/03/15 1237 05/03/15 1930 05/04/15 0206  TROPONINI 0.05* 0.05* 0.05*   ------------------------------------------------------------------------------------------------------------------ Invalid input(s): POCBNP   Recent Labs  05/07/15 2211 05/08/15 0622 05/08/15 1126 05/08/15 1710 05/08/15 2129 05/09/15 0621  GLUCAP 95 163* 179* 144* 213* 209*     RAI,RIPUDEEP M.D. [facility] 05/09/2015, 11:35 AM  Pager: [phone] Between 7am to 7pm - call Pager - [phone]  After 7pm go to [email or web address] - password [email or web address]  Call night coverage person covering after 7pm

## 2015-05-09 NOTE — Progress Notes (Signed)
Subjective:  Denies any SOB. He says his admitting complaint was edema and that is improving.  Objective:  Vital Signs in the last 24 hours: Temp:  [98.1 F (36.7 C)-98.7 F (37.1 C)] 98.7 F (37.1 C) (08/16 1045) Pulse Rate:  [51-69] 65 (08/16 1045) Resp:  [16-18] 16 (08/16 1045) BP: (106-124)/(37-55) 118/44 mmHg (08/16 1045) SpO2:  [98 %-100 %] 99 % (08/16 1045) Weight:  [324 lb 15.3 oz (147.4 kg)-326 lb 8 oz (148.099 kg)] 324 lb 15.3 oz (147.4 kg) (08/16 0617)  Intake/Output from previous day:  Intake/Output Summary (Last 24 hours) at 05/09/15 1058 Last data filed at 05/09/15 0900  Gross per 24 hour  Intake   2184 ml  Output   4700 ml  Net  -2516 ml    Physical Exam: General appearance: alert, cooperative, no distress and morbidly obese Neck: no JVD Lungs: clear to auscultation bilaterally Heart: irregularly irregular rhythm Extremities: 1+ edema   Rate: 70  Rhythm: atrial fibrillation  Lab Results:  Recent Labs  05/08/15 2049 05/09/15 0620  WBC 8.2 8.7  HGB 9.0* 9.4*  PLT 180 188    Recent Labs  05/08/15 0420 05/09/15 0620  NA 136 135  K 3.8 3.8  CL 85* 85*  CO2 41* 38*  GLUCOSE 147* 212*  BUN 63* 59*  CREATININE 1.83* 1.80*   No results for input(s): TROPONINI in the last 72 hours.  Invalid input(s): CK, MB No results for input(s): INR in the last 72 hours.  Scheduled Meds: . atorvastatin  20 mg Oral QHS  . cholecalciferol  2,000 Units Oral Daily  . furosemide  80 mg Oral BID  . gabapentin  300 mg Oral TID  . insulin aspart  0-9 Units Subcutaneous TID WC  . insulin aspart  15 Units Subcutaneous TID WC  . insulin glargine  40 Units Subcutaneous QHS  . magnesium oxide  400 mg Oral Daily  . multivitamin with minerals  1 tablet Oral Daily  . omega-3 acid ethyl esters  2 g Oral Daily  . Rivaroxaban  15 mg Oral Q breakfast  . saccharomyces boulardii  250 mg Oral BID  . sodium chloride  3 mL Intravenous Q12H   Continuous Infusions:    PRN Meds:.sodium chloride, acetaminophen, albuterol, ondansetron (ZOFRAN) IV, sodium chloride   Imaging: Imaging results have been reviewed  Cardiac Studies: Echo 05/03/15 Study Conclusions  - Left ventricle: The cavity size was normal. Wall thickness was normal. Systolic function was vigorous. The estimated ejection fraction was in the range of 65% to 70%. Wall motion was normal; there were no regional wall motion abnormalities. Doppler parameters are consistent with elevated mean left atrial filling pressure. - Aortic valve: Valve area (Vmax): 2.71 cm^2. - Mitral valve: Moderately calcified annulus. There was mild to moderate regurgitation. Valve area by pressure half-time: 1.71 cm^2. Valve area by continuity equation (using LVOT flow): 2.39 cm^2. - Left atrium: The atrium was severely dilated. - Right ventricle: The cavity size was mildly dilated. - Right atrium: The atrium was moderately to severely dilated. - Pulmonary arteries: Systolic pressure was moderately increased. PA peak pressure: 58 mm Hg (S).  Assessment/Plan:  69y/o morbidly obese, debilitated, male with chronic diastolic heart failure, CAD s/p remote PCI, HTN, permanent atrial fibrillation on Xarelto (failed to hold NSR on Amiodarone), bradycardia, CKD II, DM Type II and OSA admitted 05/02/15 with acute on chronic diastolic heart failure.He has diuresed 21L and 27 lbs.    Principal Problem:  Acute on chronic diastolic heart failure Active Problems:   Morbid obesity-BMI 51   DM (diabetes mellitus), type 2 with renal complications   Anemia, iron deficiency   Acute renal failure superimposed on stage 3 chronic kidney disease   Atrial fibrillation with slow ventricular response   CKD (chronic kidney disease), stage III   CAD- S/P remote PCI ([place])   HTN (hypertension)   Peripheral edema   OSA (obstructive sleep apnea)   Anticoagulant long-term use   PLAN: He continues to diurese. Now on  oral Lasix. Renal function improving with diuresis. CAF with CVR on no rate control agents.   Pt is significantly debilitated. He fell in the bathroom after I rounded on him and was unable to get up- required a mechanical lift to get him off the floor. He may need rehab/SNF after discharge.   Corine Shelter PA-C 05/09/2015, 10:58 AM [phone]  I have examined the patient and reviewed assessment and plan and discussed with patient.  Agree with above as stated.  His leg swelling is better.  Continue diuretics for now.  Renal function stable.  He will be seen by the CHF team tomorrow.    VARANASI,JAYADEEP S.

## 2015-05-09 NOTE — Clinical Social Work Note (Signed)
CSW received consult for SNF placement.  CSW did not have time to assess will complete tomorrow morning.  Ervin Knack. Jazzmyne Rasnick, MSW, Theresia Majors [phone] 05/09/2015 5:21 PM

## 2015-05-09 NOTE — Evaluation (Signed)
Physical Therapy Evaluation Patient Details Name: Mark Roberson MRN: [id number] DOB: [birth year] Today's Date: 05/09/2015   History of Present Illness  Pt is a 69 y.o. male with a PMH of HTN, DM, CHF, a-fib, OSA, asthma, and CKD-III, who presents with worsening LEE, increased body weight and diarrhea. Pt was admitted for a CHF exacerbation.  Clinical Impression  Pt admitted with above diagnosis. Pt currently with functional limitations due to the deficits listed below (see PT Problem List). The focus of this session was to assist pt off bathroom floor with staff. Required BUE support to come into long sitting position. Attempted to initiate standing from floor x3 by pushing off toilet, pulling up on grab bars, and assist +3 with gait belt. Unable to achieve standing and maxi-move was utilized.   Pt will benefit from skilled PT to increase their independence and safety with mobility to allow discharge to the venue listed below. Feel that if this situation happened at home, pt would not be able to get himself up (even with assist) even though he would be in his own environment. Recommending STR at the SNF level to increase functional independence and decrease risk of falls prior to return home. Will continue to follow.     Follow Up Recommendations SNF;Supervision/Assistance - 24 hour    Equipment Recommendations  None recommended by PT    Recommendations for Other Services       Precautions / Restrictions Precautions Precautions: Fall Restrictions Weight Bearing Restrictions: No      Mobility  Bed Mobility               General bed mobility comments: BUE support was provided to pt by RN to transition to longsit position from supine.   Transfers                 General transfer comment: Maxi-move utilized to assist pt off bathroom floor, where he was found at beginning of session. Multiple attempts to get pt to standing using grab bars and gait belt for support/safety  but pt was unable to achieve standing.   Ambulation/Gait                Stairs            Wheelchair Mobility    Modified Rankin (Stroke Patients Only)       Balance Overall balance assessment: Needs assistance Sitting-balance support: Feet supported;No upper extremity supported Sitting balance-Leahy Scale: Fair         Standing balance comment: Unable to assess                             Pertinent Vitals/Pain Pain Assessment: No/denies pain    Home Living Family/patient expects to be discharged to:: Private residence Living Arrangements: Non-relatives/Friends Available Help at Discharge: Friend(s);Available 24 hours/day Type of Home: House Home Access: Stairs to enter Entrance Stairs-Rails: Right Entrance Stairs-Number of Steps: 3 Home Layout: One level Home Equipment: Walker - 2 wheels;Bedside commode;Tub bench;Wheelchair - Building surveyor      Prior Function Level of Independence: Needs assistance   Gait / Transfers Assistance Needed: Pt using a RW at home. Also states he sits in his wheelchair "a lot, depending on the day."  ADL's / Homemaking Assistance Needed: assist with toilet hygiene and washing bottom        Hand Dominance        Extremity/Trunk Assessment   Upper Extremity Assessment: Generalized weakness  Lower Extremity Assessment: Generalized weakness (B LE edema)      Cervical / Trunk Assessment: Normal  Communication   Communication: No difficulties  Cognition Arousal/Alertness: Awake/alert Behavior During Therapy: WFL for tasks assessed/performed Overall Cognitive Status: Within Functional Limits for tasks assessed                      General Comments      Exercises        Assessment/Plan    PT Assessment Patient needs continued PT services  PT Diagnosis Difficulty walking;Generalized weakness   PT Problem List Decreased strength;Decreased range of  motion;Decreased activity tolerance;Decreased balance;Decreased mobility;Decreased knowledge of use of DME;Decreased safety awareness;Decreased knowledge of precautions;Cardiopulmonary status limiting activity;Obesity  PT Treatment Interventions DME instruction;Gait training;Stair training;Functional mobility training;Therapeutic activities;Therapeutic exercise;Neuromuscular re-education;Patient/family education   PT Goals (Current goals can be found in the Care Plan section) Acute Rehab PT Goals Patient Stated Goal: Pt did not state goals during session. PT Goal Formulation: With patient Time For Goal Achievement: 05/23/15 Potential to Achieve Goals: Good    Frequency Min 3X/week   Barriers to discharge        Co-evaluation               End of Session Equipment Utilized During Treatment: Gait belt;Oxygen Activity Tolerance: Patient tolerated treatment well Patient left: in bed;with call bell/phone within reach;with nursing/sitter in room Nurse Communication: Mobility status         Time: 1610-9604 PT Time Calculation (min) (ACUTE ONLY): 27 min   Charges:   PT Evaluation $Initial PT Evaluation Tier I: 1 Procedure PT Treatments $Therapeutic Activity: 8-22 mins   PT G Codes:        Conni Slipper 06/03/2015, 1:21 PM  Conni Slipper, PT, DPT Acute Rehabilitation Services Pager: [phone]

## 2015-05-09 NOTE — Progress Notes (Signed)
Pt stable during shift; pt denies any pain, discomfort or injury. Neuro intact, no injuries noted; pt continues to be impulsive, bed alarm on. Reported off to oncoming RN. Arabella Merles Tj Kitchings RN.

## 2015-05-09 NOTE — Progress Notes (Signed)
RN [facility] wrote, pt has self owned inhaler by the bedside. Educated on why he cannot self administer it. Refused to give it up. Also, pt refuses to have side rails up for safety and he like to go the bathroom without assistance. Pt educated on the importance but he still refuses.

## 2015-05-10 ENCOUNTER — Institutional Professional Consult (permissible substitution): Payer: Self-pay | Admitting: Pulmonary Disease

## 2015-05-10 DIAGNOSIS — N289 Disorder of kidney and ureter, unspecified: Secondary | ICD-10-CM

## 2015-05-10 DIAGNOSIS — N179 Acute kidney failure, unspecified: Secondary | ICD-10-CM | POA: Insufficient documentation

## 2015-05-10 DIAGNOSIS — N189 Chronic kidney disease, unspecified: Secondary | ICD-10-CM | POA: Insufficient documentation

## 2015-05-10 DIAGNOSIS — D649 Anemia, unspecified: Secondary | ICD-10-CM

## 2015-05-10 DIAGNOSIS — E1122 Type 2 diabetes mellitus with diabetic chronic kidney disease: Secondary | ICD-10-CM

## 2015-05-10 DIAGNOSIS — Z7901 Long term (current) use of anticoagulants: Secondary | ICD-10-CM

## 2015-05-10 LAB — BASIC METABOLIC PANEL
ANION GAP: 10 (ref 5–15)
BUN: 52 mg/dL — ABNORMAL HIGH (ref 6–20)
CHLORIDE: 87 mmol/L — AB (ref 101–111)
CO2: 41 mmol/L — AB (ref 22–32)
Calcium: 9.1 mg/dL (ref 8.9–10.3)
Creatinine, Ser: 1.82 mg/dL — ABNORMAL HIGH (ref 0.61–1.24)
GFR calc non Af Amer: 36 mL/min — ABNORMAL LOW (ref >60)
GFR, EST AFRICAN AMERICAN: 42 mL/min — AB (ref >60)
Glucose, Bld: 165 mg/dL — ABNORMAL HIGH (ref 65–99)
POTASSIUM: 3.4 mmol/L — AB (ref 3.5–5.1)
Sodium: 138 mmol/L (ref 135–145)

## 2015-05-10 LAB — GLUCOSE, CAPILLARY
GLUCOSE-CAPILLARY: 158 mg/dL — AB (ref 65–99)
GLUCOSE-CAPILLARY: 75 mg/dL (ref 65–99)
Glucose-Capillary: 219 mg/dL — ABNORMAL HIGH (ref 65–99)
Glucose-Capillary: 174 mg/dL — ABNORMAL HIGH (ref 65–99)

## 2015-05-10 NOTE — Progress Notes (Signed)
Inpatient Diabetes Program Recommendations  AACE/ADA: New Consensus Statement on Inpatient Glycemic Control (2013)  Target Ranges:  Prepandial:   less than 140 mg/dL      Peak postprandial:   less than 180 mg/dL (1-2 hours)      Critically ill patients:  140 - 180 mg/dL   Results for MALIQ, PILLEY (MRN [id number]) as of 05/10/2015 10:41  Ref. Range 05/09/2015 06:21 05/09/2015 11:31 05/09/2015 15:58 05/09/2015 21:01 05/10/2015 06:04  Glucose-Capillary Latest Ref Range: 65-99 mg/dL 409 (H) 811 (H) 914 (H) 181 (H) 219 (H)   Diabetes history: DM 2 Outpatient Diabetes medications: Lantus 50 units QHS, Novolog 30 units TID meal coverage Current orders for Inpatient glycemic control: Lantus 40 units QHS, Novolog Sensitive correction, Novolog 15 units meal coverage  Inpatient Diabetes Program Recommendations Insulin - Basal: Fasting glucose past 2 mornings 209, 219 mg/dl. Patient takes 50 units of basal at home. Please consider increasing basal insulin slightly to Lantus 45 units QHS if patient remains in the hospital.  Thanks,  Christena Deem RN, MSN, [facility] Inpatient Diabetes Coordinator Team Pager [phone]

## 2015-05-10 NOTE — Progress Notes (Addendum)
Patient ID: Mark Roberson, male   DOB: 1946/02/10, 69 y.o.   MRN: [id number]     SUBJECTIVE: Mark Roberson diuresed well again yesterday.  No BMET today.  Edema improved.  Still not very mobile.  Scheduled Meds: . atorvastatin  20 mg Oral QHS  . cholecalciferol  2,000 Units Oral Daily  . furosemide  80 mg Oral BID  . gabapentin  300 mg Oral TID  . insulin aspart  0-9 Units Subcutaneous TID WC  . insulin aspart  15 Units Subcutaneous TID WC  . insulin glargine  40 Units Subcutaneous QHS  . magnesium oxide  400 mg Oral Daily  . multivitamin with minerals  1 tablet Oral Daily  . omega-3 acid ethyl esters  2 g Oral Daily  . Rivaroxaban  15 mg Oral Q breakfast  . saccharomyces boulardii  250 mg Oral BID  . sodium chloride  3 mL Intravenous Q12H   Continuous Infusions:  PRN Meds:.sodium chloride, acetaminophen, albuterol, ondansetron (ZOFRAN) IV, sodium chloride    Filed Vitals:   05/09/15 1808 05/09/15 1953 05/10/15 0345 05/10/15 0800  BP: 123/45 149/47 142/56 109/42  Pulse: 61 70 65 60  Temp: 98 F (36.7 C) 98.6 F (37 C) 98.2 F (36.8 C)   TempSrc: Oral Oral Axillary   Resp: Height:      Weight:   325 lb 7 oz (147.617 kg)   SpO2: 97% 98% 100% 100%    Intake/Output Summary (Last 24 hours) at 05/10/15 1048 Last data filed at 05/10/15 0900  Gross per 24 hour  Intake   1080 ml  Output   3600 ml  Net  -2520 ml    LABS: Basic Metabolic Panel:  Recent Labs  78/29/56 0420 05/09/15 0620  NA 136 135  K 3.8 3.8  CL 85* 85*  CO2 41* 38*  GLUCOSE 147* 212*  BUN 63* 59*  CREATININE 1.83* 1.80*  CALCIUM 8.8* 9.0   Liver Function Tests: No results for input(s): AST, ALT, ALKPHOS, BILITOT, PROT, ALBUMIN in the last 72 hours. No results for input(s): LIPASE, AMYLASE in the last 72 hours. CBC:  Recent Labs  05/08/15 2049 05/09/15 0620  WBC 8.2 8.7  NEUTROABS 4.7  --   HGB 9.0* 9.4*  HCT 30.6* 31.7*  MCV 86.7 86.4  PLT 180 188   Cardiac Enzymes: No  results for input(s): CKTOTAL, CKMB, CKMBINDEX, TROPONINI in the last 72 hours. BNP: Invalid input(s): POCBNP D-Dimer: No results for input(s): DDIMER in the last 72 hours. Hemoglobin A1C: No results for input(s): HGBA1C in the last 72 hours. Fasting Lipid Panel: No results for input(s): CHOL, HDL, LDLCALC, TRIG, CHOLHDL, LDLDIRECT in the last 72 hours. Thyroid Function Tests: No results for input(s): TSH, T4TOTAL, T3FREE, THYROIDAB in the last 72 hours.  Invalid input(s): FREET3 Anemia Panel: No results for input(s): VITAMINB12, FOLATE, FERRITIN, TIBC, IRON, RETICCTPCT in the last 72 hours.  RADIOLOGY: Dg Chest 2 View  05/02/2015   CLINICAL DATA:  Edema.  Weight gain.  Fluid retention for 1 week.  EXAM: CHEST  2 VIEW  COMPARISON:  01/11/2015.  FINDINGS: Interstitial pulmonary edema is present with Kerley B lines. The cardiopericardial silhouette is mildly enlarged for projection. Mediastinal contours are within normal limits. No pleural effusion. Respiratory motion is present on the lateral projection.  IMPRESSION: Interstitial pulmonary edema compatible with mild CHF.   Electronically Signed   By: Mark Roberson M.D.   On: 05/02/2015 19:36   US Renal  05/04/2015   CLINICAL DATA:  Acute renal injury  EXAM: RENAL / URINARY TRACT ULTRASOUND COMPLETE  COMPARISON:  None.  FINDINGS: Right Kidney:  Length: 11.5 cm. Echogenicity and renal cortical thickness within normal limits. No mass, perinephric fluid, or hydronephrosis visualized. No sonographically demonstrable calculus or ureterectasis.  Left Kidney:  Length: 8.6 cm. Echogenicity within normal limits. There is renal cortical thinning. No mass, perinephric fluid, or hydronephrosis visualized. No sonographically demonstrable calculus or ureterectasis.  Bladder:  Appears normal for degree of bladder distention.  IMPRESSION: Small left kidney with renal cortical thinning compared to the normal-appearing right kidney. This size discrepancy raises  question of renal artery stenosis on the left. In this regard, question whether patient is hypertensive. Study otherwise unremarkable.   Electronically Signed   By: Mark Roberson M.D.   On: 05/04/2015 01:44    PHYSICAL EXAM General: NAD Neck: Thick, JVP 10 cm, no thyromegaly or thyroid nodule.  Lungs: Clear to auscultation bilaterally with normal respiratory effort. CV: Nondisplaced PMI.  Heart irregular S1/S2, no S3/S4, no murmur.  1+ ankle edema.    Abdomen: Soft, nontender, no hepatosplenomegaly, no distention.  Neurologic: Alert and oriented x 3.  Psych: Normal affect. Extremities: No clubbing or cyanosis.   TELEMETRY: Reviewed telemetry pt in atrial fibrillation, HR 60s  ASSESSMENT AND PLAN: 69 yo with diastolic CHF, permanent atrial fibrillation, CKD, and obesity admitted with acute/chronic diastolic CHF.  1. Acute on chronic diastolic CHF: EF 09-81% on last echo with mild RV enlargement. PA pressure elevated by echo. 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 He has diuresed well this admission but is still volume overloaded.  - Continue IV lasix 80 mg bid today.  2. Pulmonary hypertension: Elevated PA pressure on last echo. 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 This will need to be treated by oxygen, CPAP, and diuresis.  3. Atrial fibrillation: This has been persistent, probably since 2/16. He is back in atrial fibrillation today, unable to hold NSR after DCCV in 7/16.  - No longer on amiodarone as not maintaining NSR.  HR controlled without beta blocker.  - Continue Xarelto 15 mg daily (GFR < 50).  4. OSA/suspected OHS: Continue oxygen continuously. He will need to start on CPAP given severe OSA. Awaiting appt with Mark Roberson. 5. CAD:  Stable, no chest pain. He is on Xarelto with stable CAD so not on aspirin. Continue statin, good lipids earlier this year.  6. CKD: Follow creatinine closely with diuresis, BMET today (not done yet).  7. PT recommends SNF stay.   Mark Roberson 05/10/2015 10:52 AM

## 2015-05-10 NOTE — Clinical Social Work Note (Addendum)
CSW spoke with patient who is adamant that he does not want to go to a SNF for short term rehab.  Patient states he would rather go home with home health despite PT recommendation for SNF.  Patient expressed it is his right to choose if he wants to go to SNF or not.  Patient stated he has been in rehab before and does not want to experience it again.  CSW informed him there are multiple facilities in the area if he wants to try a different one, and patient still said no.  Patient stated the home health PT is the best he has had and feels like others will not be able to do as well.  CSW informed patient that he would be getting PT and OT 5-7 days per week at SNF for strengthening, and it would only be short term but he still will not agree to go to SNF.  CSW informed case manager that patient does not want to go to SNF despite CSW trying to explain to him the benefits.  CSW to continue to follow in case patient changes his mind.   FL2 and DNR has been signed by physician and is on patient's chart.  Mark Roberson. Devoiry Corriher, MSW, Theresia Majors [phone] 05/10/2015 6:09 PM

## 2015-05-10 NOTE — Progress Notes (Signed)
1125 Read PT eval. Pt unable to walk and took 3 assist with gait belt use and still unable to stand. Pt not appropriate for our services at this time. Will follow PT progress. Luetta Nutting RN BSN 05/10/2015 11:27 AM

## 2015-05-10 NOTE — Progress Notes (Signed)
[facility] PROGRESS NOTE  Filed Weights   05/09/15 0500 05/09/15 0617 05/10/15 0345  Weight: 148.099 kg (326 lb 8 oz) 147.4 kg (324 lb 15.3 oz) 147.617 kg (325 lb 7 oz)        Intake/Output Summary (Last 24 hours) at 05/10/15 1302 Last data filed at 05/10/15 1250  Gross per 24 hour  Intake    840 ml  Output   3850 ml  Net  -3010 ml     Assessment/Plan: Acute on chronic diastolic heart failure: - Started on IV diuretics, 2-D echo showed an EF of 65% no wall motion abnormalities Weight is trending down nicely, she has diuresis about 25 L. Cardiology was consulted Cardiology recommended to continue IV Lasix.  Pulmonary hypertension: Likely due to obstructive sleep apnea and hypoxic pulmonary vasoconstriction. Continue oxygen.  Chronic atrial fibrillation: Currently rate controlled continue Xarelto. CHA2DS2-VASc Score is 3  Obstructive sleep apnea and suspected Only to start C-pap at home.  Essential hypertension: Continue Coreg and Lasix.  Uncontrolled diabetes mellitus: With a last hemoglobin A1c of 7.4, continue current regimen.  Acute on chronic kidney disease/cardiorenal syndrome: With a baseline creatinine of 1.3-1.8, his creatinine on admission was 2.8. Renal ultrasound shows small left kidney with cortical thinning. Creatinine has improved significantly with diuresis.  Chronic respiratory failure with hypoxia: Continue oxygen at home.   Code Status: DNR Family Communication: none  Disposition Plan: inpatient   Consultants:  cardiology  Procedures: ECHO: EF of 55% with mildly dilated right ventricular  Antibiotics:  None  HPI/Subjective: No compalins  Objective: Filed Vitals:   05/09/15 1808 05/09/15 1953 05/10/15 0345 05/10/15 0800  BP: 123/45 149/47 142/56 109/42  Pulse: 61 70 65 60  Temp: 98 F (36.7 C) 98.6 F (37 C) 98.2 F (36.8 C)   TempSrc: Oral Oral Axillary   Resp: 14 17 17 20   Height:      Weight:   147.617 kg  (325 lb 7 oz)   SpO2: 97% 98% 100% 100%     Exam:  General: Alert, awake, oriented x3, in no acute distress.  HEENT: No bruits, no goiter.  Heart: Regular rate and rhythm. Lungs: Good air movement,clear Abdomen: Soft, nontender, nondistended, positive bowel sounds.  Neuro: Grossly intact, nonfocal.   Data Reviewed: Basic Metabolic Panel:  Recent Labs Lab 05/06/15 1223 05/07/15 0353 05/08/15 0420 05/09/15 0620 05/10/15 1200  NA 137 136 136 135 138  K 4.0 4.5 3.8 3.8 3.4*  CL 83* 85* 85* 85* 87*  CO2 41* 38* 41* 38* 41*  GLUCOSE 69 143* 147* 212* 165*  BUN 72* 70* 63* 59* 52*  CREATININE 1.93* 1.97* 1.83* 1.80* 1.82*  CALCIUM 9.0 9.0 8.8* 9.0 9.1   Liver Function Tests: No results for input(s): AST, ALT, ALKPHOS, BILITOT, PROT, ALBUMIN in the last 168 hours. No results for input(s): LIPASE, AMYLASE in the last 168 hours. No results for input(s): AMMONIA in the last 168 hours. CBC:  Recent Labs Lab 05/06/15 1223 05/07/15 0353 05/08/15 0420 05/08/15 2049 05/09/15 0620  WBC 7.7 8.3 9.5 8.2 8.7  NEUTROABS  --   --   --  4.7  --   HGB 8.3* 7.9* 7.9* 9.0* 9.4*  HCT 28.0* 26.5* 27.2* 30.6* 31.7*  MCV 86.2 85.5 86.6 86.7 86.4  PLT 179 177 193 180 188   Cardiac Enzymes:  Recent Labs Lab 05/03/15 1930 05/04/15 0206  TROPONINI 0.05* 0.05*   BNP (last 3 results)  Recent Labs  01/11/15 1942 03/07/15  1645 05/03/15 0516  BNP 248.0* 222.5* 284.3*    ProBNP (last 3 results) No results for input(s): PROBNP in the last 8760 hours.  CBG:  Recent Labs Lab 05/09/15 1131 05/09/15 1558 05/09/15 2101 05/10/15 0604 05/10/15 1140  GLUCAP 137* 201* 181* 219* 174*    Recent Results (from the past 240 hour(s))  MRSA PCR Screening     Status: None   Collection Time: 05/03/15 11:30 AM  Result Value Ref Range Status   MRSA by PCR NEGATIVE NEGATIVE Final    Comment:        The GeneXpert MRSA Assay (FDA approved for NASAL specimens only), is one component of  a comprehensive MRSA colonization surveillance program. It is not intended to diagnose MRSA infection nor to guide or monitor treatment for MRSA infections.      Studies: No results found.  Scheduled Meds: . atorvastatin  20 mg Oral QHS  . cholecalciferol  2,000 Units Oral Daily  . furosemide  80 mg Oral BID  . gabapentin  300 mg Oral TID  . insulin aspart  0-9 Units Subcutaneous TID WC  . insulin aspart  15 Units Subcutaneous TID WC  . insulin glargine  40 Units Subcutaneous QHS  . magnesium oxide  400 mg Oral Daily  . multivitamin with minerals  1 tablet Oral Daily  . omega-3 acid ethyl esters  2 g Oral Daily  . Rivaroxaban  15 mg Oral Q breakfast  . saccharomyces boulardii  250 mg Oral BID  . sodium chloride  3 mL Intravenous Q12H   Continuous Infusions:    Marinda Elk  [facility] Pager [phone] If 7PM-7AM, please contact night-coverage at [email or web address], password [facility] 05/10/2015, 1:02 PM  LOS: 8 days

## 2015-05-10 NOTE — Clinical Social Work Note (Signed)
Clinical Social Work Assessment  Patient Details  Name: Mark Roberson MRN: [id number] Date of Birth: May 19, 1946  Date of referral:  05/09/15               Reason for consult:  Facility Placement                Permission sought to share information with:    Permission granted to share information::  No  Name::        Agency::     Relationship::     Contact Information:     Housing/Transportation Living arrangements for the past 2 months:  Single Family Home Source of Information:  Patient Patient Interpreter Needed:  None Criminal Activity/Legal Involvement Pertinent to Current Situation/Hospitalization:  No - Comment as needed Significant Relationships:  Friend Lives with:  Friends Do you feel safe going back to the place where you live?  Yes (Patient states he does feel safe to go back home despite PT recommendations for SNF) Need for family participation in patient care:  No (Coment)  Care giving concerns:  Patient feels he can go back home with home health and does not want or need to go to a SNF despite PT recommendations.   Social Worker assessment / plan:  Patient is a 69 year old obese male who does not live alone.  PT is recommending patient go to a SNF for short term rehab, but patient does not want to go.  Patient states his home health PT is better than any other PTs.  Patient is adamant about not going to a SNF, and would rather go home.  CSW explained to patient the benefits of going to a SNF for short term rehab, where he would get therapy 5-7 days a week, but patient says he want to go home and have home health see him.  Patient stated the only difference between going to a SNF and going home is there is bike he can ride in rehab.  CSW informed patient that PT is recommending he go to a SNF for short term rehab, and patient states it is his choice to decide if he wants to go to SNF or not.  CSW to continue to follow patient in case he changes his mind, case manager made  aware of patient's choice.    Employment status:  Disabled (Comment on whether or not currently receiving Disability) Insurance information:  Medicare PT Recommendations:  Skilled Nursing Facility Information / Referral to community resources:     Patient/Family's Response to care:  Patient is not currently agreeing to go to a SNF even though PT is given him the recommendation.  Patient/Family's Understanding of and Emotional Response to Diagnosis, Current Treatment, and Prognosis:  Patient is aware of the risks of returning home, but he still chooses to go home with home health.  Emotional Assessment Appearance:  Appears stated age Attitude/Demeanor/Rapport:    Affect (typically observed):  Agitated, Frustrated Orientation:  Oriented to Self, Oriented to Place, Oriented to  Time, Oriented to Situation Alcohol / Substance use:  Not Applicable Psych involvement (Current and /or in the community):  No (Comment)  Discharge Needs  Concerns to be addressed:  Discharge Planning Concerns Readmission within the last 30 days:  No Current discharge risk:  Other (Patient is being recommended SNF for short term rehab, but he is choosing not to go.) Barriers to Discharge:  Other (Patient does not want to go to a SNF, even though PT feels he is  an unsafe discharge to home.)   [place] Constable 05/10/2015, 5:06 PM

## 2015-05-11 ENCOUNTER — Encounter (HOSPITAL_COMMUNITY): Payer: Self-pay

## 2015-05-11 DIAGNOSIS — N189 Chronic kidney disease, unspecified: Secondary | ICD-10-CM

## 2015-05-11 LAB — BASIC METABOLIC PANEL
ANION GAP: 8 (ref 5–15)
BUN: 48 mg/dL — ABNORMAL HIGH (ref 6–20)
CHLORIDE: 89 mmol/L — AB (ref 101–111)
CO2: 43 mmol/L — ABNORMAL HIGH (ref 22–32)
Calcium: 9.1 mg/dL (ref 8.9–10.3)
Creatinine, Ser: 1.71 mg/dL — ABNORMAL HIGH (ref 0.61–1.24)
GFR, EST AFRICAN AMERICAN: 45 mL/min — AB (ref >60)
GFR, EST NON AFRICAN AMERICAN: 39 mL/min — AB (ref >60)
Glucose, Bld: 135 mg/dL — ABNORMAL HIGH (ref 65–99)
POTASSIUM: 3.6 mmol/L (ref 3.5–5.1)
SODIUM: 140 mmol/L (ref 135–145)

## 2015-05-11 LAB — GLUCOSE, CAPILLARY
GLUCOSE-CAPILLARY: 113 mg/dL — AB (ref 65–99)
GLUCOSE-CAPILLARY: 163 mg/dL — AB (ref 65–99)
Glucose-Capillary: 131 mg/dL — ABNORMAL HIGH (ref 65–99)
Glucose-Capillary: 292 mg/dL — ABNORMAL HIGH (ref 65–99)

## 2015-05-11 MED ORDER — FUROSEMIDE 10 MG/ML IJ SOLN
80.0000 mg | Freq: Two times a day (BID) | INTRAMUSCULAR | Status: DC
  Administered 2015-05-11 – 2015-05-14 (×6): 80 mg via INTRAVENOUS
  Filled 2015-05-11 (×8): qty 8

## 2015-05-11 NOTE — Clinical Social Work Note (Signed)
CSW received referral for SNF.  Case discussed with case manager, and plan is to discharge home with home health.  CSW to sign off please re-consult if social work needs arise.  Loyal Rudy R. Eugine Bubb, MSW, LCSWA [phone]  

## 2015-05-11 NOTE — Progress Notes (Signed)
Utilization review completed.  

## 2015-05-11 NOTE — Progress Notes (Signed)
Patient ID: Mark Roberson, male   DOB: 1946-07-18, 69 y.o.   MRN: [id number]     SUBJECTIVE: Mark Roberson continues to diurese well on po lasix, although weight shows up 1 lb.  Cr slightly improved 1.82 -> 1.71.  Edema improved.  Seen by cardiac rehab yesterday deemed to be not appropriate for their services. Mobility very limited.  Scheduled Meds: . atorvastatin  20 mg Oral QHS  . cholecalciferol  2,000 Units Oral Daily  . furosemide  80 mg Oral BID  . gabapentin  300 mg Oral TID  . insulin aspart  0-9 Units Subcutaneous TID WC  . insulin aspart  15 Units Subcutaneous TID WC  . insulin glargine  40 Units Subcutaneous QHS  . magnesium oxide  400 mg Oral Daily  . multivitamin with minerals  1 tablet Oral Daily  . omega-3 acid ethyl esters  2 g Oral Daily  . Rivaroxaban  15 mg Oral Q breakfast  . saccharomyces boulardii  250 mg Oral BID  . sodium chloride  3 mL Intravenous Q12H   Continuous Infusions:  PRN Meds:.sodium chloride, acetaminophen, albuterol, ondansetron (ZOFRAN) IV, sodium chloride    Filed Vitals:   05/10/15 1500 05/10/15 2149 05/10/15 2151 05/11/15 0444  BP: 105/45 116/37 111/44 132/52  Pulse: 61 55 56 56  Temp: 98.6 F (37 C) 98.1 F (36.7 C)  97.4 F (36.3 C)  TempSrc: Oral Oral  Oral  Resp: Height:      Weight:    326 lb 3.2 oz (147.963 kg)  SpO2: 99% 100%  100%    Intake/Output Summary (Last 24 hours) at 05/11/15 0834 Last data filed at 05/11/15 0357  Gross per 24 hour  Intake    780 ml  Output   3550 ml  Net  -2770 ml    LABS: Basic Metabolic Panel:  Recent Labs  56/21/30 1200 05/11/15 0333  NA 138 140  K 3.4* 3.6  CL 87* 89*  CO2 41* 43*  GLUCOSE 165* 135*  BUN 52* 48*  CREATININE 1.82* 1.71*  CALCIUM 9.1 9.1   Liver Function Tests: No results for input(s): AST, ALT, ALKPHOS, BILITOT, PROT, ALBUMIN in the last 72 hours. No results for input(s): LIPASE, AMYLASE in the last 72 hours. CBC:  Recent Labs  05/08/15 2049  05/09/15 0620  WBC 8.2 8.7  NEUTROABS 4.7  --   HGB 9.0* 9.4*  HCT 30.6* 31.7*  MCV 86.7 86.4  PLT 180 188   Cardiac Enzymes: No results for input(s): CKTOTAL, CKMB, CKMBINDEX, TROPONINI in the last 72 hours. BNP: Invalid input(s): POCBNP D-Dimer: No results for input(s): DDIMER in the last 72 hours. Hemoglobin A1C: No results for input(s): HGBA1C in the last 72 hours. Fasting Lipid Panel: No results for input(s): CHOL, HDL, LDLCALC, TRIG, CHOLHDL, LDLDIRECT in the last 72 hours. Thyroid Function Tests: No results for input(s): TSH, T4TOTAL, T3FREE, THYROIDAB in the last 72 hours.  Invalid input(s): FREET3 Anemia Panel: No results for input(s): VITAMINB12, FOLATE, FERRITIN, TIBC, IRON, RETICCTPCT in the last 72 hours.  RADIOLOGY: Dg Chest 2 View  05/02/2015   CLINICAL DATA:  Edema.  Weight gain.  Fluid retention for 1 week.  EXAM: CHEST  2 VIEW  COMPARISON:  01/11/2015.  FINDINGS: Interstitial pulmonary edema is present with Kerley B lines. The cardiopericardial silhouette is mildly enlarged for projection. Mediastinal contours are within normal limits. No pleural effusion. Respiratory motion is present on the lateral projection.  IMPRESSION: Interstitial pulmonary  edema compatible with mild CHF.   Electronically Signed   By: Andreas Newport M.D.   On: 05/02/2015 19:36   US Renal  05/04/2015   CLINICAL DATA:  Acute renal injury  EXAM: RENAL / URINARY TRACT ULTRASOUND COMPLETE  COMPARISON:  None.  FINDINGS: Right Kidney:  Length: 11.5 cm. Echogenicity and renal cortical thickness within normal limits. No mass, perinephric fluid, or hydronephrosis visualized. No sonographically demonstrable calculus or ureterectasis.  Left Kidney:  Length: 8.6 cm. Echogenicity within normal limits. There is renal cortical thinning. No mass, perinephric fluid, or hydronephrosis visualized. No sonographically demonstrable calculus or ureterectasis.  Bladder:  Appears normal for degree of bladder distention.   IMPRESSION: Small left kidney with renal cortical thinning compared to the normal-appearing right kidney. This size discrepancy raises question of renal artery stenosis on the left. In this regard, question whether patient is hypertensive. Study otherwise unremarkable.   Electronically Signed   By: Bretta Bang III M.D.   On: 05/04/2015 01:44    PHYSICAL EXAM General: NAD, lying flat in bed with no SOB Neck: Thick, JVP 9-10 cm, no thyromegaly or thyroid nodule.  Lungs: CTA bilaterally with normal respiratory effort. CV: Nondisplaced PMI.  Heart irregular S1/S2, no S3/S4, no murmur.  1+ pre tibial edema Abdomen: Obese. Soft, nontender, no hepatosplenomegaly, no distention.  Neurologic: Alert and oriented x 3.  Psych: Normal affect. Extremities: No clubbing or cyanosis.   TELEMETRY: Reviewed telemetry pt in atrial fibrillation, HR 50-60s Occasional PVCs, including several short episodes of Vent Bigeminy.  ASSESSMENT AND PLAN: 69 yo with diastolic CHF, permanent atrial fibrillation, CKD, and obesity admitted with acute/chronic diastolic CHF.  1. Acute on chronic diastolic CHF: EF 14-78% on last echo with mild RV enlargement. PA pressure elevated by echo. 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  - Diuresing well this admission but still volume overloaded. Still 4-5 lbs up from baseline of 322 in May. - Continue IV lasix 80 mg bid   2. Pulmonary hypertension: Elevated PA pressure on last echo. 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  - This will need to be treated by oxygen, CPAP, and diuresis.  3. Atrial fibrillation: This has been persistent, likely since 2/16.  - In atrial fibrillation today, unable to hold NSR after DCCV in 7/16.  - No longer on  amiodarone as not maintaining NSR.  HR controlled without beta blocker.  - Continue Xarelto 15 mg daily (GFR < 50).  4. OSA/suspected OHS:  - Continuous 02.  - He will need to start on CPAP given severe OSA. Awaiting appt with Dr Craige Cotta. 5. CAD: Stable, no chest pain.  - On Xarelto with stable CAD so not on aspirin.  - Continue statin, good lipids earlier this year.  6. CKD:  - Follow creatinine closely with diuresis. Stable, with slightly improvement from yesterday. 7. PT recommends SNF stay.   Benjy Kana PA-C 05/11/2015 8:34 AM  Advanced Heart Failure Team Pager [phone] (M-F; 7a - 4p)  Please contact [facility] for night-coverage after hours (4p -7a ) and weekends on [email or web address]  Patient seen with PA, agree with the above note.  Still some volume overload though diuresing well.  Continue Lasix 80 mg IV bid for another day at least.    Marca Ancona 05/11/2015 2:16 PM

## 2015-05-11 NOTE — Progress Notes (Signed)
Physical Therapy Treatment Patient Details Name: Mark Roberson MRN: [id number] DOB: 10-Aug-1946 Today's Date: 05/11/2015    History of Present Illness Pt is a 69 y.o. male with a PMH of HTN, DM, CHF, a-fib, OSA, asthma, and CKD-III, who presents with worsening leg edema, increased body weight and diarrhea. Pt was admitted for a CHF exacerbation. Pt s/p fall in bathroom 8/16. Reports his Rt knee buckled when he bent down to adjust toilet seat. "I usually keep my legs locked so that doesn't happen"    PT Comments    Patient pleasant and cooperative. Good discussion re: recent events, [facility], and his reasons for wanting to go home with HHPT. Pt demonstrated ability to safey use RW and ambulate household distance without difficulty. Agree with home with friends to assist him as needed (as they did PTA) and HHPT to resume.   Follow Up Recommendations  Home health PT;Supervision for mobility/OOB     Equipment Recommendations  None recommended by PT    Recommendations for Other Services       Precautions / Restrictions Precautions Precautions: Fall Restrictions Weight Bearing Restrictions: No    Mobility  Bed Mobility Overal bed mobility: Modified Independent             General bed mobility comments: light use of bedside table to push himself up (states he uses same at home)  Transfers Overall transfer level: Modified independent Equipment used: Rolling walker (2 wheeled)             General transfer comment: from EOB with RW  Ambulation/Gait Ambulation/Gait assistance: Supervision;Min guard Ambulation Distance (Feet): 70 Feet Assistive device: Rolling walker (2 wheeled) Gait Pattern/deviations: Step-through pattern;Decreased stride length;Trunk flexed;Wide base of support Gait velocity: Decreased   General Gait Details: Demonstrated proper use of RW; no unsteadiness or knee buckling. Progressed from minguard to supervision for safety due to recent  fall   Administrator mobility:  (pt denies using w/c for locomotion; just sits in it at home)  Modified Rankin (Stroke Patients Only)       Balance   Sitting-balance support: No upper extremity supported;Feet supported Sitting balance-Leahy Scale: Good     Standing balance support: No upper extremity supported Standing balance-Leahy Scale: Fair                      Cognition Arousal/Alertness: Awake/alert Behavior During Therapy: WFL for tasks assessed/performed Overall Cognitive Status: Within Functional Limits for tasks assessed                      Exercises      General Comments General comments (skin integrity, edema, etc.): Lengthy discussion re: recent medical issues, [facility] ~2 weeks ago, and his desire to return home with HHPT. Reports therapy he gets at home is much better than what he recieved at SNF and that SNF did poor job of medically managing his CHF.      Pertinent Vitals/Pain Pain Assessment: No/denies pain    Home Living                      Prior Function            PT Goals (current goals can now be found in the care plan section) Acute Rehab PT Goals Patient Stated Goal: to return home and resume HHPT Time  For Goal Achievement: 05/23/15 Progress towards PT goals: Progressing toward goals    Frequency  Min 3X/week    PT Plan Discharge plan needs to be updated    Co-evaluation             End of Session Equipment Utilized During Treatment: Gait belt;Oxygen Activity Tolerance: Patient tolerated treatment well Patient left: in bed;with call bell/phone within reach;with bed alarm set     Time: 1008-1030 PT Time Calculation (min) (ACUTE ONLY): 22 min  Charges:  $Gait Training: 8-22 mins                    G Codes:      Mark Roberson 06/03/2015, 11:42 AM  Pager [phone]

## 2015-05-11 NOTE — Progress Notes (Signed)
[facility] PROGRESS NOTE  Filed Weights   05/09/15 0617 05/10/15 0345 05/11/15 0444  Weight: 147.4 kg (324 lb 15.3 oz) 147.617 kg (325 lb 7 oz) 147.963 kg (326 lb 3.2 oz)        Intake/Output Summary (Last 24 hours) at 05/11/15 1252 Last data filed at 05/11/15 1200  Gross per 24 hour  Intake    720 ml  Output   2650 ml  Net  -1930 ml     Assessment/Plan: Acute on chronic diastolic heart failure: - Started on IV diuretics, 2-D echo showed an EF of 65% no wall motion abnormalities Weight is trending down nicely, she has diuresis about 25 L.  Cardiology recommended to continue IV Lasix. Weight still above baseline.  Pulmonary hypertension: Likely due to obstructive sleep apnea and hypoxic pulmonary vasoconstriction. Continue oxygen.  Chronic atrial fibrillation: Currently rate controlled continue Xarelto. CHA2DS2-VASc Score is 3  Obstructive sleep apnea and suspected Only to start C-pap at home.  Essential hypertension: Continue Coreg and Lasix.  Uncontrolled diabetes mellitus: With a last hemoglobin A1c of 7.4, continue current regimen.  Acute on chronic kidney disease/cardiorenal syndrome: With a baseline creatinine of 1.3-1.8, his creatinine on admission was 2.8. Renal ultrasound shows small left kidney with cortical thinning. Creatinine has improved significantly with diuresis.  Chronic respiratory failure with hypoxia: Continue oxygen at home.   Code Status: DNR Family Communication: none  Disposition Plan: inpatient   Consultants:  cardiology  Procedures: ECHO: EF of 55% with mildly dilated right ventricular  Antibiotics:  None  HPI/Subjective: No compalins  Objective: Filed Vitals:   05/10/15 2149 05/10/15 2151 05/11/15 0444 05/11/15 0942  BP: 116/37 111/44 132/52 107/44  Pulse: 55 56 56 53  Temp: 98.1 F (36.7 C)  97.4 F (36.3 C) 98 F (36.7 C)  TempSrc: Oral  Oral Axillary  Resp: Height:      Weight:    147.963 kg (326 lb 3.2 oz)   SpO2: 100%  100% 100%     Exam:  General: Alert, awake, oriented x3, in no acute distress.  HEENT: No bruits, no goiter.  Heart: Regular rate and rhythm. Lungs: Good air movement,clear Abdomen: Soft, nontender, nondistended, positive bowel sounds.  Neuro: Grossly intact, nonfocal.   Data Reviewed: Basic Metabolic Panel:  Recent Labs Lab 05/07/15 0353 05/08/15 0420 05/09/15 0620 05/10/15 1200 05/11/15 0333  NA 136 136 135 138 140  K 4.5 3.8 3.8 3.4* 3.6  CL 85* 85* 85* 87* 89*  CO2 38* 41* 38* 41* 43*  GLUCOSE 143* 147* 212* 165* 135*  BUN 70* 63* 59* 52* 48*  CREATININE 1.97* 1.83* 1.80* 1.82* 1.71*  CALCIUM 9.0 8.8* 9.0 9.1 9.1   Liver Function Tests: No results for input(s): AST, ALT, ALKPHOS, BILITOT, PROT, ALBUMIN in the last 168 hours. No results for input(s): LIPASE, AMYLASE in the last 168 hours. No results for input(s): AMMONIA in the last 168 hours. CBC:  Recent Labs Lab 05/06/15 1223 05/07/15 0353 05/08/15 0420 05/08/15 2049 05/09/15 0620  WBC 7.7 8.3 9.5 8.2 8.7  NEUTROABS  --   --   --  4.7  --   HGB 8.3* 7.9* 7.9* 9.0* 9.4*  HCT 28.0* 26.5* 27.2* 30.6* 31.7*  MCV 86.2 85.5 86.6 86.7 86.4  PLT 179 177 193 180 188   Cardiac Enzymes: No results for input(s): CKTOTAL, CKMB, CKMBINDEX, TROPONINI in the last 168 hours. BNP (last 3 results)  Recent Labs  01/11/15 1942  03/07/15 1645 05/03/15 0516  BNP 248.0* 222.5* 284.3*    ProBNP (last 3 results) No results for input(s): PROBNP in the last 8760 hours.  CBG:  Recent Labs Lab 05/10/15 1140 05/10/15 1701 05/10/15 2146 05/11/15 0613 05/11/15 1149  GLUCAP 174* 158* 75 131* 113*    Recent Results (from the past 240 hour(s))  MRSA PCR Screening     Status: None   Collection Time: 05/03/15 11:30 AM  Result Value Ref Range Status   MRSA by PCR NEGATIVE NEGATIVE Final    Comment:        The GeneXpert MRSA Assay (FDA approved for NASAL specimens only),  is one component of a comprehensive MRSA colonization surveillance program. It is not intended to diagnose MRSA infection nor to guide or monitor treatment for MRSA infections.      Studies: No results found.  Scheduled Meds: . atorvastatin  20 mg Oral QHS  . cholecalciferol  2,000 Units Oral Daily  . furosemide  80 mg Oral BID  . gabapentin  300 mg Oral TID  . insulin aspart  0-9 Units Subcutaneous TID WC  . insulin aspart  15 Units Subcutaneous TID WC  . insulin glargine  40 Units Subcutaneous QHS  . magnesium oxide  400 mg Oral Daily  . multivitamin with minerals  1 tablet Oral Daily  . omega-3 acid ethyl esters  2 g Oral Daily  . Rivaroxaban  15 mg Oral Q breakfast  . saccharomyces boulardii  250 mg Oral BID  . sodium chloride  3 mL Intravenous Q12H   Continuous Infusions:    Marinda Elk  [facility] Pager [phone] If 7PM-7AM, please contact night-coverage at [email or web address], password [facility] 05/11/2015, 12:52 PM  LOS: 9 days

## 2015-05-12 LAB — CBC WITH DIFFERENTIAL/PLATELET
Basophils Absolute: 0 10*3/uL (ref 0.0–0.1)
Basophils Relative: 0 % (ref 0–1)
EOS ABS: 1.9 10*3/uL — AB (ref 0.0–0.7)
EOS PCT: 21 % — AB (ref 0–5)
HCT: 30.9 % — ABNORMAL LOW (ref 39.0–52.0)
Hemoglobin: 9 g/dL — ABNORMAL LOW (ref 13.0–17.0)
LYMPHS ABS: 1.2 10*3/uL (ref 0.7–4.0)
Lymphocytes Relative: 13 % (ref 12–46)
MCH: 25.4 pg — AB (ref 26.0–34.0)
MCHC: 29.1 g/dL — ABNORMAL LOW (ref 30.0–36.0)
MCV: 87 fL (ref 78.0–100.0)
MONO ABS: 1.3 10*3/uL — AB (ref 0.1–1.0)
Monocytes Relative: 14 % — ABNORMAL HIGH (ref 3–12)
Neutro Abs: 4.7 10*3/uL (ref 1.7–7.7)
Neutrophils Relative %: 52 % (ref 43–77)
PLATELETS: 180 10*3/uL (ref 150–400)
RBC: 3.55 MIL/uL — AB (ref 4.22–5.81)
RDW: 16.6 % — AB (ref 11.5–15.5)
WBC: 9 10*3/uL (ref 4.0–10.5)

## 2015-05-12 LAB — BASIC METABOLIC PANEL
Anion gap: 9 (ref 5–15)
BUN: 42 mg/dL — AB (ref 6–20)
CHLORIDE: 87 mmol/L — AB (ref 101–111)
CO2: 42 mmol/L — ABNORMAL HIGH (ref 22–32)
CREATININE: 1.67 mg/dL — AB (ref 0.61–1.24)
Calcium: 8.9 mg/dL (ref 8.9–10.3)
GFR calc Af Amer: 47 mL/min — ABNORMAL LOW (ref >60)
GFR, EST NON AFRICAN AMERICAN: 40 mL/min — AB (ref >60)
GLUCOSE: 135 mg/dL — AB (ref 65–99)
Potassium: 3.5 mmol/L (ref 3.5–5.1)
SODIUM: 138 mmol/L (ref 135–145)

## 2015-05-12 LAB — GLUCOSE, CAPILLARY
GLUCOSE-CAPILLARY: 110 mg/dL — AB (ref 65–99)
GLUCOSE-CAPILLARY: 147 mg/dL — AB (ref 65–99)
Glucose-Capillary: 142 mg/dL — ABNORMAL HIGH (ref 65–99)
Glucose-Capillary: 92 mg/dL (ref 65–99)

## 2015-05-12 LAB — MAGNESIUM: MAGNESIUM: 2.3 mg/dL (ref 1.7–2.4)

## 2015-05-12 MED ORDER — POTASSIUM CHLORIDE CRYS ER 20 MEQ PO TBCR
40.0000 meq | EXTENDED_RELEASE_TABLET | Freq: Once | ORAL | Status: AC
  Administered 2015-05-12: 40 meq via ORAL
  Filled 2015-05-12: qty 2

## 2015-05-12 NOTE — Care Management Important Message (Signed)
Important Message  Patient Details  Name: Mark Roberson MRN: [id number] Date of Birth: 05/14/1946   Medicare Important Message Given:  Yes-fourth notification given    Darrold Span, RN 05/12/2015, 10:26 AM

## 2015-05-12 NOTE — Progress Notes (Signed)
Patient ID: Mark Roberson, male   DOB: [birth year], 69 y.o.   MRN: [id number]     SUBJECTIVE: Mr Grosso diuresed very well on IV lasix yesterday.  Had been on po lasix for past several days, but still had moderate diuresis. Weight down 2 lbs.  Cr slightly improved 1.82 -> 1.71 ->1.67.  Edema improving.  He says he feels good today.  Feels near his baseline.   Seen by cardiac rehab 05/10/15 deemed not appropriate for their services. Mobility very limited.  Scheduled Meds: . atorvastatin  20 mg Oral QHS  . cholecalciferol  2,000 Units Oral Daily  . furosemide  80 mg Intravenous BID  . gabapentin  300 mg Oral TID  . insulin aspart  0-9 Units Subcutaneous TID WC  . insulin aspart  15 Units Subcutaneous TID WC  . insulin glargine  40 Units Subcutaneous QHS  . magnesium oxide  400 mg Oral Daily  . multivitamin with minerals  1 tablet Oral Daily  . omega-3 acid ethyl esters  2 g Oral Daily  . Rivaroxaban  15 mg Oral Q breakfast  . saccharomyces boulardii  250 mg Oral BID  . sodium chloride  3 mL Intravenous Q12H   Continuous Infusions:  PRN Meds:.sodium chloride, acetaminophen, albuterol, ondansetron (ZOFRAN) IV, sodium chloride    Filed Vitals:   05/11/15 0942 05/11/15 1330 05/11/15 2022 05/12/15 0402  BP: 107/44 122/47 144/51 146/66  Pulse: 53 58 56 64  Temp: 98 F (36.7 C) 98.2 F (36.8 C) 97.8 F (36.6 C) 97.8 F (36.6 C)  TempSrc: Axillary Oral Oral Oral  Resp: 18 18 18 18   Height:      Weight:    324 lb 12.8 oz (147.328 kg)  SpO2: 100% 100% 100% 100%    Intake/Output Summary (Last 24 hours) at 05/12/15 0814 Last data filed at 05/12/15 0405  Gross per 24 hour  Intake   1560 ml  Output   3752 ml  Net  -2192 ml    LABS: Basic Metabolic Panel:  Recent Labs  40/98/11 0333 05/12/15 0347  NA 140 138  K 3.6 3.5  CL 89* 87*  CO2 43* 42*  GLUCOSE 135* 135*  BUN 48* 42*  CREATININE 1.71* 1.67*  CALCIUM 9.1 8.9   Liver Function Tests: No results for input(s): AST,  ALT, ALKPHOS, BILITOT, PROT, ALBUMIN in the last 72 hours. No results for input(s): LIPASE, AMYLASE in the last 72 hours. CBC:  Recent Labs  05/12/15 0347  WBC 9.0  NEUTROABS 4.7  HGB 9.0*  HCT 30.9*  MCV 87.0  PLT 180   Cardiac Enzymes: No results for input(s): CKTOTAL, CKMB, CKMBINDEX, TROPONINI in the last 72 hours. BNP: Invalid input(s): POCBNP D-Dimer: No results for input(s): DDIMER in the last 72 hours. Hemoglobin A1C: No results for input(s): HGBA1C in the last 72 hours. Fasting Lipid Panel: No results for input(s): CHOL, HDL, LDLCALC, TRIG, CHOLHDL, LDLDIRECT in the last 72 hours. Thyroid Function Tests: No results for input(s): TSH, T4TOTAL, T3FREE, THYROIDAB in the last 72 hours.  Invalid input(s): FREET3 Anemia Panel: No results for input(s): VITAMINB12, FOLATE, FERRITIN, TIBC, IRON, RETICCTPCT in the last 72 hours.  RADIOLOGY: Dg Chest 2 View  05/02/2015   CLINICAL DATA:  Edema.  Weight gain.  Fluid retention for 1 week.  EXAM: CHEST  2 VIEW  COMPARISON:  01/11/2015.  FINDINGS: Interstitial pulmonary edema is present with Kerley B lines. The cardiopericardial silhouette is mildly enlarged for projection. Mediastinal contours are within  normal limits. No pleural effusion. Respiratory motion is present on the lateral projection.  IMPRESSION: Interstitial pulmonary edema compatible with mild CHF.   Electronically Signed   By: Andreas Newport M.D.   On: 05/02/2015 19:36   US Renal  05/04/2015   CLINICAL DATA:  Acute renal injury  EXAM: RENAL / URINARY TRACT ULTRASOUND COMPLETE  COMPARISON:  None.  FINDINGS: Right Kidney:  Length: 11.5 cm. Echogenicity and renal cortical thickness within normal limits. No mass, perinephric fluid, or hydronephrosis visualized. No sonographically demonstrable calculus or ureterectasis.  Left Kidney:  Length: 8.6 cm. Echogenicity within normal limits. There is renal cortical thinning. No mass, perinephric fluid, or hydronephrosis visualized.  No sonographically demonstrable calculus or ureterectasis.  Bladder:  Appears normal for degree of bladder distention.  IMPRESSION: Small left kidney with renal cortical thinning compared to the normal-appearing right kidney. This size discrepancy raises question of renal artery stenosis on the left. In this regard, question whether patient is hypertensive. Study otherwise unremarkable.   Electronically Signed   By: Bretta Bang III M.D.   On: 05/04/2015 01:44    PHYSICAL EXAM General: NAD, lying flat with no SOB Neck: Thick, JVP 8-9 cm, no thyromegaly or thyroid nodule noted Lungs: CTA  normal effort. CV: Nondisplaced PMI.  Heart irregular S1/S2, no S3/S4, no murmur.  1+ pre tibial edema Abdomen: Morbidly obese. Soft, nontender, no hepatosplenomegaly, no distention.  Neurologic: Alert and oriented x 3.  Psych: Normal affect. Extremities: No clubbing or cyanosis.   TELEMETRY: Reviewed telemetry pt in atrial fibrillation, HR 50s Occasional PVCs  ASSESSMENT AND PLAN: 69 yo with diastolic CHF, permanent atrial fibrillation, CKD, and obesity admitted with acute/chronic diastolic CHF.  1. Acute on chronic diastolic CHF: EF 16-10% on last echo with mild RV enlargement. PA pressure elevated by echo. 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  - Diuresing well this admission but still has some volume on board. Still several lbs up from baseline of 322 in May. - Continue IV lasix 80 mg bid for today. 2. Pulmonary hypertension: Elevated PA pressure on last echo. 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  - This will need to be treated by oxygen, CPAP, and diuresis.  3. Atrial fibrillation: This has been persistent, likely since 2/16.  - Remains in atrial  fibrillation today, unable to hold NSR after DCCV in 7/16.  - No longer on amiodarone as not maintaining NSR.  HR controlled without beta blocker.  - Continue Xarelto 15 mg daily (GFR < 50).  4. OSA/suspected OHS:  - Continuous 02.  - He will need to start on CPAP given severe OSA. Awaiting appt with Dr Craige Cotta. 5. CAD: Stable, no chest pain.  - On Xarelto with stable CAD so not on aspirin.  - Continue statin, good lipids earlier this year.  6. CKD:  - Follow creatinine closely with diuresis. Stable, with some improvement from yesterday. 7. PT recommends SNF stay.   Dispo: very near to recent baseline. Pt refuses SNF stay, so keeping for one more day of IV diuresis may be helpful for optimization.  Plan on switch to po tomorrow.  Zayven Powe PA-C 05/12/2015 8:14 AM  Advanced Heart Failure Team Pager [phone] (M-F; 7a - 4p)  Please contact [facility] for night-coverage after hours (4p -7a ) and weekends  on [email or web address]  Patient seen with PA, agree with the above note.  He diuresed well yesterday.  Still some volume overload but improving.  Creatinine coming down.  Would give one more day IV Lasix.  Plan to convert to torsemide 80 mg bid tomorrow for home.  He will also take metolazone 5 mg twice weekly as he has been doing.  Needs followup in CHF clinic.  He has refused SNF, will need home PT and home health.   Marca Ancona 05/12/2015 12:25 PM

## 2015-05-12 NOTE — Progress Notes (Signed)
[facility] PROGRESS NOTE  Filed Weights   05/10/15 0345 05/11/15 0444 05/12/15 0402  Weight: 147.617 kg (325 lb 7 oz) 147.963 kg (326 lb 3.2 oz) 147.328 kg (324 lb 12.8 oz)        Intake/Output Summary (Last 24 hours) at 05/12/15 1043 Last data filed at 05/12/15 1000  Gross per 24 hour  Intake   1440 ml  Output   4552 ml  Net  -3112 ml     Assessment/Plan: Acute on chronic diastolic heart failure: - Started on IV diuretics, 2-D echo showed an EF of 65% no wall motion abnormalities Weight is trending down nicely. Further management per cardiology.  Acute on chronic kidney disease/cardiorenal syndrome: With a baseline creatinine of 1.3-1.8, his creatinine on admission was 2.8. Continues to improve with IV diuresis.  Pulmonary hypertension: Likely due to obstructive sleep apnea and hypoxic pulmonary vasoconstriction. Continue oxygen.  Chronic atrial fibrillation: Currently rate controlled continue Xarelto. CHA2DS2-VASc Score is 3  Obstructive sleep apnea and suspected Only to start C-pap at home.  Essential hypertension: Continue Coreg and Lasix.  Uncontrolled diabetes mellitus: With a last hemoglobin A1c of 7.4, continue current regimen.  Chronic respiratory failure with hypoxia: Continue oxygen at home.   Code Status: DNR Family Communication: none  Disposition Plan: inpatient   Consultants:  cardiology  Procedures: ECHO: EF of 55% with mildly dilated right ventricular  Antibiotics:  None  HPI/Subjective: He relates he feels good today closed to baseline.  Objective: Filed Vitals:   05/11/15 0942 05/11/15 1330 05/11/15 2022 05/12/15 0402  BP: 107/44 122/47 144/51 146/66  Pulse: 53 58 56 64  Temp: 98 F (36.7 C) 98.2 F (36.8 C) 97.8 F (36.6 C) 97.8 F (36.6 C)  TempSrc: Axillary Oral Oral Oral  Resp: Height:      Weight:    147.328 kg (324 lb 12.8 oz)  SpO2: 100% 100% 100% 100%     Exam:  General: Alert,  awake, oriented x3, in no acute distress.  HEENT: No bruits, no goiter.  Heart: Regular rate and rhythm. Lungs: Good air movement,clear Abdomen: Soft, nontender, nondistended, positive bowel sounds.  Neuro: Grossly intact, nonfocal.   Data Reviewed: Basic Metabolic Panel:  Recent Labs Lab 05/08/15 0420 05/09/15 0620 05/10/15 1200 05/11/15 0333 05/12/15 0347  NA 136 135 138 140 138  K 3.8 3.8 3.4* 3.6 3.5  CL 85* 85* 87* 89* 87*  CO2 41* 38* 41* 43* 42*  GLUCOSE 147* 212* 165* 135* 135*  BUN 63* 59* 52* 48* 42*  CREATININE 1.83* 1.80* 1.82* 1.71* 1.67*  CALCIUM 8.8* 9.0 9.1 9.1 8.9   Liver Function Tests: No results for input(s): AST, ALT, ALKPHOS, BILITOT, PROT, ALBUMIN in the last 168 hours. No results for input(s): LIPASE, AMYLASE in the last 168 hours. No results for input(s): AMMONIA in the last 168 hours. CBC:  Recent Labs Lab 05/07/15 0353 05/08/15 0420 05/08/15 2049 05/09/15 0620 05/12/15 0347  WBC 8.3 9.5 8.2 8.7 9.0  NEUTROABS  --   --  4.7  --  4.7  HGB 7.9* 7.9* 9.0* 9.4* 9.0*  HCT 26.5* 27.2* 30.6* 31.7* 30.9*  MCV 85.5 86.6 86.7 86.4 87.0  PLT 177 193 180 188 180   Cardiac Enzymes: No results for input(s): CKTOTAL, CKMB, CKMBINDEX, TROPONINI in the last 168 hours. BNP (last 3 results)  Recent Labs  01/11/15 1942 03/07/15 1645 05/03/15 0516  BNP 248.0* 222.5* 284.3*    ProBNP (last 3  results) No results for input(s): PROBNP in the last 8760 hours.  CBG:  Recent Labs Lab 05/11/15 0613 05/11/15 1149 05/11/15 1649 05/11/15 2103 05/12/15 0627  GLUCAP 131* 113* 163* 292* 110*    Recent Results (from the past 240 hour(s))  MRSA PCR Screening     Status: None   Collection Time: 05/03/15 11:30 AM  Result Value Ref Range Status   MRSA by PCR NEGATIVE NEGATIVE Final    Comment:        The GeneXpert MRSA Assay (FDA approved for NASAL specimens only), is one component of a comprehensive MRSA colonization surveillance program. It is  not intended to diagnose MRSA infection nor to guide or monitor treatment for MRSA infections.      Studies: No results found.  Scheduled Meds: . atorvastatin  20 mg Oral QHS  . cholecalciferol  2,000 Units Oral Daily  . furosemide  80 mg Intravenous BID  . gabapentin  300 mg Oral TID  . insulin aspart  0-9 Units Subcutaneous TID WC  . insulin aspart  15 Units Subcutaneous TID WC  . insulin glargine  40 Units Subcutaneous QHS  . magnesium oxide  400 mg Oral Daily  . multivitamin with minerals  1 tablet Oral Daily  . omega-3 acid ethyl esters  2 g Oral Daily  . Rivaroxaban  15 mg Oral Q breakfast  . saccharomyces boulardii  250 mg Oral BID  . sodium chloride  3 mL Intravenous Q12H   Continuous Infusions:    Marinda Elk  [facility] Pager [phone] If 7PM-7AM, please contact night-coverage at [email or web address], password [facility] 05/12/2015, 10:43 AM  LOS: 10 days

## 2015-05-13 LAB — GLUCOSE, CAPILLARY
GLUCOSE-CAPILLARY: 172 mg/dL — AB (ref 65–99)
GLUCOSE-CAPILLARY: 160 mg/dL — AB (ref 65–99)
Glucose-Capillary: 179 mg/dL — ABNORMAL HIGH (ref 65–99)
Glucose-Capillary: 159 mg/dL — ABNORMAL HIGH (ref 65–99)

## 2015-05-13 LAB — BASIC METABOLIC PANEL
ANION GAP: 10 (ref 5–15)
BUN: 42 mg/dL — AB (ref 6–20)
CALCIUM: 9 mg/dL (ref 8.9–10.3)
CO2: 40 mmol/L — AB (ref 22–32)
Chloride: 87 mmol/L — ABNORMAL LOW (ref 101–111)
Creatinine, Ser: 1.61 mg/dL — ABNORMAL HIGH (ref 0.61–1.24)
GFR calc Af Amer: 49 mL/min — ABNORMAL LOW (ref >60)
GFR calc non Af Amer: 42 mL/min — ABNORMAL LOW (ref >60)
GLUCOSE: 201 mg/dL — AB (ref 65–99)
Potassium: 3.9 mmol/L (ref 3.5–5.1)
Sodium: 137 mmol/L (ref 135–145)

## 2015-05-13 NOTE — Progress Notes (Signed)
Patient refused when asked to go for a walk.  York Spaniel might try later.  Will continue to monitor. Harriet Masson, RN

## 2015-05-13 NOTE — Progress Notes (Signed)
Patient ambulated about 50 feet in the hallway with walker and oxygen with nurse.  Tolerated activity well.   Harriet Masson

## 2015-05-13 NOTE — Progress Notes (Signed)
Patient ID: Mark Roberson, male   DOB: Jun 02, 1946, 69 y.o.   MRN: [id number]     SUBJECTIVE: Denies CP; dyspnea improving  Scheduled Meds: . atorvastatin  20 mg Oral QHS  . cholecalciferol  2,000 Units Oral Daily  . furosemide  80 mg Intravenous BID  . gabapentin  300 mg Oral TID  . insulin aspart  0-9 Units Subcutaneous TID WC  . insulin aspart  15 Units Subcutaneous TID WC  . insulin glargine  40 Units Subcutaneous QHS  . magnesium oxide  400 mg Oral Daily  . multivitamin with minerals  1 tablet Oral Daily  . omega-3 acid ethyl esters  2 g Oral Daily  . Rivaroxaban  15 mg Oral Q breakfast  . saccharomyces boulardii  250 mg Oral BID  . sodium chloride  3 mL Intravenous Q12H   Continuous Infusions:  PRN Meds:.sodium chloride, acetaminophen, albuterol, ondansetron (ZOFRAN) IV, sodium chloride    Filed Vitals:   05/12/15 0402 05/12/15 1358 05/12/15 2020 05/13/15 0404  BP: 146/66 115/44 127/48 130/43  Pulse: 64 57 55 57  Temp: 97.8 F (36.6 C) 98.3 F (36.8 C) 97.9 F (36.6 C) 98.3 F (36.8 C)  TempSrc: Oral Oral Oral Oral  Resp: 18 18 17 19   Height:      Weight: 324 lb 12.8 oz (147.328 kg)   324 lb 6.4 oz (147.147 kg)  SpO2: 100% 100% 100% 100%    Intake/Output Summary (Last 24 hours) at 05/13/15 1121 Last data filed at 05/13/15 0825  Gross per 24 hour  Intake    780 ml  Output   4050 ml  Net  -3270 ml    LABS: Basic Metabolic Panel:  Recent Labs  78/29/56 0347 05/13/15 0316  NA 138 137  K 3.5 3.9  CL 87* 87*  CO2 42* 40*  GLUCOSE 135* 201*  BUN 42* 42*  CREATININE 1.67* 1.61*  CALCIUM 8.9 9.0  MG 2.3  --    CBC:  Recent Labs  05/12/15 0347  WBC 9.0  NEUTROABS 4.7  HGB 9.0*  HCT 30.9*  MCV 87.0  PLT 180    RADIOLOGY: Dg Chest 2 View  05/02/2015   CLINICAL DATA:  Edema.  Weight gain.  Fluid retention for 1 week.  EXAM: CHEST  2 VIEW  COMPARISON:  01/11/2015.  FINDINGS: Interstitial pulmonary edema is present with Kerley B lines. The  cardiopericardial silhouette is mildly enlarged for projection. Mediastinal contours are within normal limits. No pleural effusion. Respiratory motion is present on the lateral projection.  IMPRESSION: Interstitial pulmonary edema compatible with mild CHF.   Electronically Signed   By: Andreas Newport M.D.   On: 05/02/2015 19:36   US Renal  05/04/2015   CLINICAL DATA:  Acute renal injury  EXAM: RENAL / URINARY TRACT ULTRASOUND COMPLETE  COMPARISON:  None.  FINDINGS: Right Kidney:  Length: 11.5 cm. Echogenicity and renal cortical thickness within normal limits. No mass, perinephric fluid, or hydronephrosis visualized. No sonographically demonstrable calculus or ureterectasis.  Left Kidney:  Length: 8.6 cm. Echogenicity within normal limits. There is renal cortical thinning. No mass, perinephric fluid, or hydronephrosis visualized. No sonographically demonstrable calculus or ureterectasis.  Bladder:  Appears normal for degree of bladder distention.  IMPRESSION: Small left kidney with renal cortical thinning compared to the normal-appearing right kidney. This size discrepancy raises question of renal artery stenosis on the left. In this regard, question whether patient is hypertensive. Study otherwise unremarkable.   Electronically Signed   By:  Bretta Bang III M.D.   On: 05/04/2015 01:44    PHYSICAL EXAM General: obese NAD, lying flat with no SOB Neck: supple Lungs: CTA   CV: Nondisplaced PMI.  Heart irregular S1/S2,  1+ pre tibial edema Abdomen: Morbidly obese. Soft, nontender, no hepatosplenomegaly, no distention.  Neurologic: Alert and oriented x 3.  Psych: Normal affect. Extremities: 1 + edema  TELEMETRY: Reviewed telemetry pt in atrial fibrillation, HR 40s-50s Occasional PVCs  ASSESSMENT AND PLAN: 69 yo with diastolic CHF, permanent atrial fibrillation, CKD, and obesity admitted with acute/chronic diastolic CHF.  1. Acute on chronic diastolic CHF: EF 16-10% on last echo with mild RV  enlargement. PA pressure elevated by echo. 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  - Still with mild volume excess. Continue IV lasix 80 mg bid for today. Transition to demadex in AM. Follow renal function. 2. Pulmonary hypertension: Elevated PA pressure on last echo. 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  - This will need to be treated by oxygen and diuresis. Need to arrange CPAP following DC. 3. Atrial fibrillation: This has been persistent, likely since 2/16.  - Remains in atrial fibrillation today, unable to hold NSR after DCCV in 7/16.  - No longer on amiodarone as not maintaining NSR.  HR controlled on no AV nodal blocing agents.  - Continue Xarelto 15 mg daily (GFR < 50).  4. OSA/suspected OHS:  - Continuous 02.  - He will need to start on CPAP given severe OSA. Awaiting appt with Dr Craige Cotta. 5. CAD: Stable, no chest pain.  - On Xarelto with stable CAD so not on aspirin.  - Continue statin, good lipids earlier this year.  6. CKD:  - Follow creatinine closely with diuresis.  7. PT recommends SNF stay.  Possible DC in AM with home health; will need fu in CHF clinic.  Olga Millers MD 05/13/2015 11:21 AM

## 2015-05-13 NOTE — Progress Notes (Signed)
[facility] PROGRESS NOTE  Filed Weights   05/11/15 0444 05/12/15 0402 05/13/15 0404  Weight: 147.963 kg (326 lb 3.2 oz) 147.328 kg (324 lb 12.8 oz) 147.147 kg (324 lb 6.4 oz)        Intake/Output Summary (Last 24 hours) at 05/13/15 1610 Last data filed at 05/13/15 0825  Gross per 24 hour  Intake    780 ml  Output   4850 ml  Net  -4070 ml     Assessment/Plan: Acute on chronic diastolic heart failure: - Cont on IV diuretics, 2-D echo showed an EF of 65% no wall motion abnormalities Close to dry weight  Further management per cardiology. Can probably change to oral an monitor overnight.  Acute on chronic kidney disease/cardiorenal syndrome: With a baseline creatinine of 1.3-1.8, his creatinine on admission was 2.8. Continues to improve with IV diuresis.  Pulmonary hypertension: Continue oxygen.  Chronic atrial fibrillation: Currently rate controlled continue Xarelto. CHA2DS2-VASc Score is 3  Obstructive sleep apnea and suspected Only to start C-pap at home.  Essential hypertension: Continue Coreg and Lasix.  Uncontrolled diabetes mellitus: With a last hemoglobin A1c of 7.4, continue current regimen.  Chronic respiratory failure with hypoxia: Continue oxygen at home.   Code Status: DNR Family Communication: none  Disposition Plan: home in am   Consultants:  cardiology  Procedures: ECHO: EF of 55% with mildly dilated right ventricular  Antibiotics:  None  HPI/Subjective: No compalins  Objective: Filed Vitals:   05/12/15 0402 05/12/15 1358 05/12/15 2020 05/13/15 0404  BP: 146/66 115/44 127/48 130/43  Pulse: 64 57 55 57  Temp: 97.8 F (36.6 C) 98.3 F (36.8 C) 97.9 F (36.6 C) 98.3 F (36.8 C)  TempSrc: Oral Oral Oral Oral  Resp: 18 18 17 19   Height:      Weight: 147.328 kg (324 lb 12.8 oz)   147.147 kg (324 lb 6.4 oz)  SpO2: 100% 100% 100% 100%     Exam:  General: Alert, awake, oriented x3, in no acute distress.  HEENT: No  bruits, no goiter.  Heart: Regular rate and rhythm. Lungs: Good air movement,clear Abdomen: Soft, nontender, nondistended, positive bowel sounds.  Neuro: Grossly intact, nonfocal.   Data Reviewed: Basic Metabolic Panel:  Recent Labs Lab 05/09/15 0620 05/10/15 1200 05/11/15 0333 05/12/15 0347 05/13/15 0316  NA 135 138 140 138 137  K 3.8 3.4* 3.6 3.5 3.9  CL 85* 87* 89* 87* 87*  CO2 38* 41* 43* 42* 40*  GLUCOSE 212* 165* 135* 135* 201*  BUN 59* 52* 48* 42* 42*  CREATININE 1.80* 1.82* 1.71* 1.67* 1.61*  CALCIUM 9.0 9.1 9.1 8.9 9.0  MG  --   --   --  2.3  --    Liver Function Tests: No results for input(s): AST, ALT, ALKPHOS, BILITOT, PROT, ALBUMIN in the last 168 hours. No results for input(s): LIPASE, AMYLASE in the last 168 hours. No results for input(s): AMMONIA in the last 168 hours. CBC:  Recent Labs Lab 05/07/15 0353 05/08/15 0420 05/08/15 2049 05/09/15 0620 05/12/15 0347  WBC 8.3 9.5 8.2 8.7 9.0  NEUTROABS  --   --  4.7  --  4.7  HGB 7.9* 7.9* 9.0* 9.4* 9.0*  HCT 26.5* 27.2* 30.6* 31.7* 30.9*  MCV 85.5 86.6 86.7 86.4 87.0  PLT 177 193 180 188 180   Cardiac Enzymes: No results for input(s): CKTOTAL, CKMB, CKMBINDEX, TROPONINI in the last 168 hours. BNP (last 3 results)  Recent Labs  01/11/15 1942 03/07/15  1645 05/03/15 0516  BNP 248.0* 222.5* 284.3*    ProBNP (last 3 results) No results for input(s): PROBNP in the last 8760 hours.  CBG:  Recent Labs Lab 05/12/15 0627 05/12/15 1218 05/12/15 1630 05/12/15 2056 05/13/15 0556  GLUCAP 110* 147* 142* 92 179*    Recent Results (from the past 240 hour(s))  MRSA PCR Screening     Status: None   Collection Time: 05/03/15 11:30 AM  Result Value Ref Range Status   MRSA by PCR NEGATIVE NEGATIVE Final    Comment:        The GeneXpert MRSA Assay (FDA approved for NASAL specimens only), is one component of a comprehensive MRSA colonization surveillance program. It is not intended to diagnose  MRSA infection nor to guide or monitor treatment for MRSA infections.      Studies: No results found.  Scheduled Meds: . atorvastatin  20 mg Oral QHS  . cholecalciferol  2,000 Units Oral Daily  . furosemide  80 mg Intravenous BID  . gabapentin  300 mg Oral TID  . insulin aspart  0-9 Units Subcutaneous TID WC  . insulin aspart  15 Units Subcutaneous TID WC  . insulin glargine  40 Units Subcutaneous QHS  . magnesium oxide  400 mg Oral Daily  . multivitamin with minerals  1 tablet Oral Daily  . omega-3 acid ethyl esters  2 g Oral Daily  . Rivaroxaban  15 mg Oral Q breakfast  . saccharomyces boulardii  250 mg Oral BID  . sodium chloride  3 mL Intravenous Q12H   Continuous Infusions:    Marinda Elk  [facility] Pager [phone] If 7PM-7AM, please contact night-coverage at [email or web address], password [facility] 05/13/2015, 9:28 AM  LOS: 11 days

## 2015-05-14 DIAGNOSIS — E1121 Type 2 diabetes mellitus with diabetic nephropathy: Secondary | ICD-10-CM

## 2015-05-14 LAB — BASIC METABOLIC PANEL
Anion gap: 8 (ref 5–15)
BUN: 43 mg/dL — ABNORMAL HIGH (ref 6–20)
CALCIUM: 8.9 mg/dL (ref 8.9–10.3)
CO2: 42 mmol/L — AB (ref 22–32)
CREATININE: 1.64 mg/dL — AB (ref 0.61–1.24)
Chloride: 88 mmol/L — ABNORMAL LOW (ref 101–111)
GFR calc non Af Amer: 41 mL/min — ABNORMAL LOW (ref >60)
GFR, EST AFRICAN AMERICAN: 48 mL/min — AB (ref >60)
GLUCOSE: 186 mg/dL — AB (ref 65–99)
Potassium: 3.8 mmol/L (ref 3.5–5.1)
Sodium: 138 mmol/L (ref 135–145)

## 2015-05-14 LAB — GLUCOSE, CAPILLARY
Glucose-Capillary: 139 mg/dL — ABNORMAL HIGH (ref 65–99)
Glucose-Capillary: 161 mg/dL — ABNORMAL HIGH (ref 65–99)

## 2015-05-14 MED ORDER — TORSEMIDE 20 MG PO TABS: ORAL_TABLET | ORAL | Status: DC

## 2015-05-14 MED ORDER — METOLAZONE 2.5 MG PO TABS: ORAL_TABLET | ORAL | Status: DC

## 2015-05-14 NOTE — Discharge Summary (Signed)
Physician Discharge Summary  Mark Roberson NWG:[id number] DOB: 1946-07-22 DOA: 05/02/2015  PCP: [name], MD  Admit date: 05/02/2015 Discharge date: 05/14/2015  Time spent: 35 minutes  Recommendations for Outpatient Follow-up:  1. The heart failure clinic basic metabolic panel to monitor electrolytes.  BNP No results found for: PROBNP Filed Weights   05/12/15 0402 05/13/15 0404 05/14/15 0549  Weight: 147.328 kg (324 lb 12.8 oz) 147.147 kg (324 lb 6.4 oz) 147.464 kg (325 lb 1.6 oz)     Discharge Diagnoses:  Principal Problem:   Acute on chronic diastolic heart failure Active Problems:   Morbid obesity-BMI 51   HTN (hypertension)   DM (diabetes mellitus), type 2 with renal complications   Peripheral edema   Anemia, iron deficiency   OSA (obstructive sleep apnea)   Acute renal failure superimposed on stage 3 chronic kidney disease   Anticoagulant long-term use   Atrial fibrillation with slow ventricular response   CKD (chronic kidney disease), stage III   CAD- S/P remote PCI ([place])   Acute on chronic renal insufficiency   AKI (acute kidney injury)   Normocytic anemia   Discharge Condition: Stable  Diet recommendation: low sodium fluid restricted diet    History of present illness:  69 year old male with past medical history of essential hypertension, uncontrolled diabetes mellitus and chronic diastolic heart failure with A. fib once relative and chronic NEC stage III that comes in to the hospital as he notice his weight has been increasing at baseline  is 337 pounds on admission is 348  Hospital Course:  Acute on chronic diastolic heart failure/acute on chronic kidney disease/cardiorenal syndrome/with right ventricular failure: A 2-D echo was done that shows an ejection fraction of 65% with mild right ventricular enlargement and elevated PA pressure by echocardiogram. Cardiology was consulted they recommended to start on IV diuretics Lasix and metolazone,    His weight came down from 153 kg 147. On admission his creatinine was 2.8 with IV diuresis and returned to baseline to 1.6.  Pulmonary hypertension: Elevated pulmonary arterial pressure by echo, suspect combination of pulmonary venous hypertension and obstructive sleep apnea with obesity hypoventilation syndrome. We'll need to continue oxygen at home  Chronic atrial fibrillation: Currently rate controlled continue Xarelto. CHA2DS2-VASc Score is 3 No changes were made to his medication.  Obstructive sleep apnea and suspected obesity hypoventilation syndrome: Continue Cipro but home.  Essential hypertension: No changes were made to his medication.  Uncontrolled diabetes mellitus: No changes made to his medications, his A1c was 7.4.  Procedures:  Chest x-ray  Renal ultrasound  2-D echo  Consultations:  Cardiology  Discharge Exam: Filed Vitals:   05/14/15 1035  BP: 128/47  Pulse: 55  Temp: 98.3 F (36.8 C)  Resp: 16    General: A&O x3 Cardiovascular: RRR Respiratory: good air movement CTA B/l  Discharge Instructions      Discharge Instructions    Diet - low sodium heart healthy    Complete by:  As directed      Increase activity slowly    Complete by:  As directed             Medication List    TAKE these medications        acetaminophen 500 MG tablet  Commonly known as:  TYLENOL  Take 500 mg by mouth every 4 (four) hours as needed (pain). Do not exceed 4 gms of tylenol in 24 hours     albuterol (2.5 MG/3ML) 0.083% nebulizer solution  Commonly known as:  PROVENTIL  Take 3 mLs (2.5 mg total) by nebulization every 6 (six) hours as needed for wheezing or shortness of breath.     PROAIR HFA 108 (90 BASE) MCG/ACT inhaler  Generic drug:  albuterol  INHALE 1 TO 2 PUFFS BY MOUTH FOUR TIMES DAILY AS DIRECTED AS NEEDED FOR WHEEZING AND SHORTNESS OF BREATH     atorvastatin 20 MG tablet  Commonly known as:  LIPITOR  Take 20 mg by mouth at bedtime.      carvedilol 3.125 MG tablet  Commonly known as:  COREG  Take 3.125 mg by mouth 2 (two) times daily with a meal.     DECUBI-VITE Caps  Take 1 capsule by mouth daily.     gabapentin 300 MG capsule  Commonly known as:  NEURONTIN  Take 300 mg by mouth 3 (three) times daily.     LANTUS SOLOSTAR 100 UNIT/ML Solostar Pen  Generic drug:  Insulin Glargine  Inject 50 Units into the skin at bedtime.     Magnesium 250 MG Tabs  Take 500 mg by mouth daily. 2 by mouth daily     metolazone 2.5 MG tablet  Commonly known as:  ZAROXOLYN  Twice a week Monday and Friday or as directed     NOVOLOG FLEXPEN 100 UNIT/ML FlexPen  Generic drug:  insulin aspart  Inject 30 Units into the skin 3 (three) times daily before meals. Check blood glucose before meals     Omega 3-6-9 Caps  Take 2 capsules by mouth daily.     potassium chloride SA 20 MEQ tablet  Commonly known as:  K-DUR,KLOR-CON  Take 1 tablet (20 mEq total) by mouth 2 (two) times a week. Every Monday and Friday with Metolazone     Rivaroxaban 15 MG Tabs tablet  Commonly known as:  XARELTO  Take 1 tablet (15 mg total) by mouth daily with supper.     torsemide 20 MG tablet  Commonly known as:  DEMADEX  Take 4 tabs in AM and 2 tabs in PM     Vitamin D 2000 UNITS tablet  Take 2,000 Units by mouth daily.       Allergies  Allergen Reactions  . Percocet [Oxycodone-Acetaminophen] Other (See Comments)    hallucination  . Penicillins Hives   Follow-up Information    Follow up with Marca Ancona, MD On 05/18/2015.   Specialty:  Cardiology   Why:  at 0920 am for post hospital follow up.  Please bring all of your medications with you to your visit.  The code for patient parking is 0008.   Contact information:   [address] [place] [address] [phone]        The results of significant diagnostics from this hospitalization (including imaging, microbiology, ancillary and laboratory) are listed below for reference.     Significant Diagnostic Studies: Dg Chest 2 View  05/02/2015   CLINICAL DATA:  Edema.  Weight gain.  Fluid retention for 1 week.  EXAM: CHEST  2 VIEW  COMPARISON:  01/11/2015.  FINDINGS: Interstitial pulmonary edema is present with Kerley B lines. The cardiopericardial silhouette is mildly enlarged for projection. Mediastinal contours are within normal limits. No pleural effusion. Respiratory motion is present on the lateral projection.  IMPRESSION: Interstitial pulmonary edema compatible with mild CHF.   Electronically Signed   By: Andreas Newport M.D.   On: 05/02/2015 19:36   US Renal  05/04/2015   CLINICAL DATA:  Acute renal  injury  EXAM: RENAL / URINARY TRACT ULTRASOUND COMPLETE  COMPARISON:  None.  FINDINGS: Right Kidney:  Length: 11.5 cm. Echogenicity and renal cortical thickness within normal limits. No mass, perinephric fluid, or hydronephrosis visualized. No sonographically demonstrable calculus or ureterectasis.  Left Kidney:  Length: 8.6 cm. Echogenicity within normal limits. There is renal cortical thinning. No mass, perinephric fluid, or hydronephrosis visualized. No sonographically demonstrable calculus or ureterectasis.  Bladder:  Appears normal for degree of bladder distention.  IMPRESSION: Small left kidney with renal cortical thinning compared to the normal-appearing right kidney. This size discrepancy raises question of renal artery stenosis on the left. In this regard, question whether patient is hypertensive. Study otherwise unremarkable.   Electronically Signed   By: Bretta Bang III M.D.   On: 05/04/2015 01:44    Microbiology: No results found for this or any previous visit (from the past 240 hour(s)).   Labs: Basic Metabolic Panel:  Recent Labs Lab 05/10/15 1200 05/11/15 0333 05/12/15 0347 05/13/15 0316 05/14/15 0438  NA 138 140 138 137 138  K 3.4* 3.6 3.5 3.9 3.8  CL 87* 89* 87* 87* 88*  CO2 41* 43* 42* 40* 42*  GLUCOSE 165* 135* 135* 201* 186*  BUN 52* 48*  42* 42* 43*  CREATININE 1.82* 1.71* 1.67* 1.61* 1.64*  CALCIUM 9.1 9.1 8.9 9.0 8.9  MG  --   --  2.3  --   --    Liver Function Tests: No results for input(s): AST, ALT, ALKPHOS, BILITOT, PROT, ALBUMIN in the last 168 hours. No results for input(s): LIPASE, AMYLASE in the last 168 hours. No results for input(s): AMMONIA in the last 168 hours. CBC:  Recent Labs Lab 05/08/15 0420 05/08/15 2049 05/09/15 0620 05/12/15 0347  WBC 9.5 8.2 8.7 9.0  NEUTROABS  --  4.7  --  4.7  HGB 7.9* 9.0* 9.4* 9.0*  HCT 27.2* 30.6* 31.7* 30.9*  MCV 86.6 86.7 86.4 87.0  PLT 193 180 188 180   Cardiac Enzymes: No results for input(s): CKTOTAL, CKMB, CKMBINDEX, TROPONINI in the last 168 hours. BNP: BNP (last 3 results)  Recent Labs  01/11/15 1942 03/07/15 1645 05/03/15 0516  BNP 248.0* 222.5* 284.3*    ProBNP (last 3 results) No results for input(s): PROBNP in the last 8760 hours.  CBG:  Recent Labs Lab 05/13/15 0556 05/13/15 1128 05/13/15 1618 05/13/15 2126 05/14/15 0637  GLUCAP 179* 172* 160* 159* 139*       Signed:  Marinda Elk  [facility] 05/14/2015, 10:49 AM

## 2015-05-14 NOTE — Clinical Social Work Note (Signed)
CSW received call from floor stating that patient is being discharged and patient is having second thoughts about going home and would possibly want to be admitted to SNF. CSW explained that SNF is not being recommended for this patient as he is modified independent for bed mobility and transfers, with min guard for ambulation. At this time recommendation is for HHPT and patient has been discharged. CSW explained to RN that RNCM can set up a home SW for possible placement from home if patient feels this is necessary. CSW signing off.   Roddie Mc, MSW, [place], [place] [phone]

## 2015-05-14 NOTE — Progress Notes (Signed)
Pt discharge education and instructions completed with pt and caregiver at bedside; both voices understanding and denies any questions. Pt IV and telemetry removed; pt discharge home with caregiver to transport him home. Pt handed his prescriptions for Demadex and Zaroxolyn. Pt transported off unit via wheelchair with belongings and caregiver at side. Arabella Merles Karrie Fluellen RN.

## 2015-05-14 NOTE — Progress Notes (Signed)
Patient ID: Mark Roberson, male   DOB: 1946-05-23, 69 y.o.   MRN: [id number]     SUBJECTIVE: Denies CP; dyspnea improving  Scheduled Meds: . atorvastatin  20 mg Oral QHS  . cholecalciferol  2,000 Units Oral Daily  . furosemide  80 mg Intravenous BID  . gabapentin  300 mg Oral TID  . insulin aspart  0-9 Units Subcutaneous TID WC  . insulin aspart  15 Units Subcutaneous TID WC  . insulin glargine  40 Units Subcutaneous QHS  . magnesium oxide  400 mg Oral Daily  . multivitamin with minerals  1 tablet Oral Daily  . omega-3 acid ethyl esters  2 g Oral Daily  . Rivaroxaban  15 mg Oral Q breakfast  . saccharomyces boulardii  250 mg Oral BID  . sodium chloride  3 mL Intravenous Q12H   Continuous Infusions:  PRN Meds:.sodium chloride, acetaminophen, albuterol, ondansetron (ZOFRAN) IV, sodium chloride    Filed Vitals:   05/13/15 0404 05/13/15 1620 05/13/15 2017 05/14/15 0549  BP: 130/43 142/66 120/42 130/50  Pulse: 57 55 55 50  Temp: 98.3 F (36.8 C) 98.1 F (36.7 C) 98.6 F (37 C) 97.9 F (36.6 C)  TempSrc: Oral Oral Oral Oral  Resp: 19 18 17 16   Height:      Weight: 324 lb 6.4 oz (147.147 kg)   325 lb 1.6 oz (147.464 kg)  SpO2: 100% 100% 100% 100%    Intake/Output Summary (Last 24 hours) at 05/14/15 1013 Last data filed at 05/14/15 1010  Gross per 24 hour  Intake   1524 ml  Output   1550 ml  Net    -26 ml    LABS: Basic Metabolic Panel:  Recent Labs  40/98/11 0347 05/13/15 0316 05/14/15 0438  NA 138 137 138  K 3.5 3.9 3.8  CL 87* 87* 88*  CO2 42* 40* 42*  GLUCOSE 135* 201* 186*  BUN 42* 42* 43*  CREATININE 1.67* 1.61* 1.64*  CALCIUM 8.9 9.0 8.9  MG 2.3  --   --    CBC:  Recent Labs  05/12/15 0347  WBC 9.0  NEUTROABS 4.7  HGB 9.0*  HCT 30.9*  MCV 87.0  PLT 180    RADIOLOGY: Dg Chest 2 View  05/02/2015   CLINICAL DATA:  Edema.  Weight gain.  Fluid retention for 1 week.  EXAM: CHEST  2 VIEW  COMPARISON:  01/11/2015.  FINDINGS: Interstitial  pulmonary edema is present with Kerley B lines. The cardiopericardial silhouette is mildly enlarged for projection. Mediastinal contours are within normal limits. No pleural effusion. Respiratory motion is present on the lateral projection.  IMPRESSION: Interstitial pulmonary edema compatible with mild CHF.   Electronically Signed   By: Andreas Newport M.D.   On: 05/02/2015 19:36   US Renal  05/04/2015   CLINICAL DATA:  Acute renal injury  EXAM: RENAL / URINARY TRACT ULTRASOUND COMPLETE  COMPARISON:  None.  FINDINGS: Right Kidney:  Length: 11.5 cm. Echogenicity and renal cortical thickness within normal limits. No mass, perinephric fluid, or hydronephrosis visualized. No sonographically demonstrable calculus or ureterectasis.  Left Kidney:  Length: 8.6 cm. Echogenicity within normal limits. There is renal cortical thinning. No mass, perinephric fluid, or hydronephrosis visualized. No sonographically demonstrable calculus or ureterectasis.  Bladder:  Appears normal for degree of bladder distention.  IMPRESSION: Small left kidney with renal cortical thinning compared to the normal-appearing right kidney. This size discrepancy raises question of renal artery stenosis on the left. In this regard, question  whether patient is hypertensive. Study otherwise unremarkable.   Electronically Signed   By: Bretta Bang III M.D.   On: 05/04/2015 01:44    PHYSICAL EXAM General: obese NAD, lying flat with no SOB Neck: supple Lungs: CTA   CV: Nondisplaced PMI.  Heart irregular S1/S2,  1+ pre tibial edema Abdomen: Morbidly obese. Soft, nontender, no hepatosplenomegaly, no distention.  Neurologic: Alert and oriented x 3.  Psych: Normal affect. Extremities: 1 + edema  TELEMETRY: Reviewed telemetry pt in atrial fibrillation, HR 40s-50s Occasional PVCs  ASSESSMENT AND PLAN: 69 yo with diastolic CHF, permanent atrial fibrillation, CKD, and obesity admitted with acute/chronic diastolic CHF.  1. Acute on chronic  diastolic CHF: EF 96-04% on last echo with mild RV enlargement. PA pressure elevated by echo. 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  - Transition back to demadex 80 BID and metolazone 5 mg twice weekly. Patient can be DCed from a cardiac standpoint; follow low Na diet; fluid restrict to 2 liters daily. Resume Kdur 20 meq BID. 2. Pulmonary hypertension: Elevated PA pressure on last echo. 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  - This will need to be treated by oxygen and diuresis. Need to arrange CPAP following DC. 3. Atrial fibrillation: This has been persistent, likely since 2/16.  - Remains in atrial fibrillation today, unable to hold NSR after DCCV in 7/16.  - No longer on amiodarone as not maintaining NSR.  HR controlled on no AV nodal blocing agents.  - Continue Xarelto 15 mg daily (GFR < 50).  4. OSA/suspected OHS:  - Continuous 02.  - He will need to start on CPAP given severe OSA. Awaiting appt with Dr Craige Cotta. 5. CAD: Stable, no chest pain.  - On Xarelto with stable CAD so not on aspirin.  - Continue statin, good lipids earlier this year.  6. CKD:  - Follow creatinine closely with diuresis.  7. PT recommends SNF stay.  OK to DC with home health; will need fu in CHF clinic in 1 week; will need BMET one week with results to Dr Shirlee Latch.  Olga Millers MD 05/14/2015 10:13 AM

## 2015-05-14 NOTE — Progress Notes (Signed)
CM received call from nurse stating pt needing reinforcement  regarding HH services that has been setup for pt @ d/c. CM spoke with pt and explained HHRN/PT/OT should be resumed with [facility] @ D/C.  Pt stated he is concerned that in 2 weeks his primary caretaker is going on trip and he will be alone and unable to care for self. CM encouraged pt to continue progressing  with [facility] PT/OT @ D/C and assured pt if  SNF/REHAB is needed the  PT can make that referral . CM also discussed with primary caretaker what was discussed with pt. CM called [facility] @ [phone]), left voice message regarding pt's discharging today as  f/u  for  HHRN/PT/OT inplace. No other needs identified per CM. Gae Gallop RN,BSN,CM [phone]

## 2015-05-15 ENCOUNTER — Telehealth (HOSPITAL_COMMUNITY): Payer: Self-pay | Admitting: *Deleted

## 2015-05-15 NOTE — Telephone Encounter (Signed)
Late Entry from 12:30 pm--pt's caregiver called to report pt is not doing well today, she states he just got home from the hospital yesterday and he feels very bad and has been sleeping all day.  She checked his BP at 11:30 and it was 63/38 HR 43 CBG 139, she rechecked all again at 12:30 and BP was 81/50 HR 47 CBG 240.  She states he is c/o blurry vision and no energy at all, he was barely able to get the bathroom and back.  His wt yesterday at the hospital was 325 and he is 326 lb today on his home scale.  Discussed all with Dr Shirlee Latch, he wants pt to stop Carvedilol and hold Torsemide today, resume tomorrow.  Caregiver is aware and agreeable, if pt gets to feeling worse she will take him to ER, he is sch for f/u with Dr Shirlee Latch on [place] and will keep that appt

## 2015-05-16 ENCOUNTER — Telehealth: Payer: Self-pay | Admitting: Internal Medicine

## 2015-05-16 NOTE — Telephone Encounter (Signed)
Mark Roberson from [facility] called stating the patient's BP after walking to the bathroom was 84/43. They are already holding 2 bp medications. She would like to know what Dr. Dorise Hiss wants them to do. Also, she states the patient's caregiver cannot take care of him and they would like to have a Mark Roberson come do an evaluation. Also wants nursing 2x week. CB# [phone]

## 2015-05-17 ENCOUNTER — Telehealth (HOSPITAL_COMMUNITY): Payer: Self-pay | Admitting: *Deleted

## 2015-05-17 NOTE — Telephone Encounter (Signed)
Received VM from [facility] this AM, she is reporting pt's wt is up 3 lbs overnight and BP 81/45.  Also received VM from pt's caregiver Elfrida Sink [phone]) stating the same info.  Called Rita back, she states pt is still feeling bad and sleeping all the time.  She states wt is up and feet are red and starting to swell.    Discussed all with Dr Shirlee Latch, he would like pt to increase torsemide to 80 mg Twice daily today and keep appt tomorrow AM.  [place] Sink is aware and agreeable, she states she is really concerned about pt and feels he should have sent to a snf not home, advised I was unsure why pt didn't go to a snf but is difficult to get placed from home.  Advised to take the extra Torsemide, if pt is really doing bad and she feels he can't wait till appt tomorrow to take pt to ER, she is agreeable and will try the Torsemide.

## 2015-05-17 NOTE — Telephone Encounter (Signed)
Spoke to [place] from [place], and advised an office visit. She said he will go to the cardiologist tomorrow to address his blood pressure and she will put the orders in for nursing.

## 2015-05-17 NOTE — Telephone Encounter (Signed)
He would likely need acute visit to address since he has not been here in almost 4 months and has had several hospitalizations since that time. If he is symptomatic with his BP we would need list of meds he is taking to help decide. Ok with verbal order for social work and nursing.

## 2015-05-18 ENCOUNTER — Ambulatory Visit (HOSPITAL_COMMUNITY)
Admission: RE | Admit: 2015-05-18 | Discharge: 2015-05-18 | Disposition: A | Discharge status: Home / Self Care | Payer: Medicare Other | Source: Ambulatory Visit | Attending: Cardiology | Admitting: Cardiology

## 2015-05-18 ENCOUNTER — Telehealth (HOSPITAL_COMMUNITY): Payer: Self-pay | Admitting: *Deleted

## 2015-05-18 VITALS — BP 90/48 | HR 55 | Wt 336.4 lb

## 2015-05-18 DIAGNOSIS — N179 Acute kidney failure, unspecified: Secondary | ICD-10-CM | POA: Diagnosis not present

## 2015-05-18 DIAGNOSIS — Z794 Long term (current) use of insulin: Secondary | ICD-10-CM | POA: Diagnosis not present

## 2015-05-18 DIAGNOSIS — I5022 Chronic systolic (congestive) heart failure: Secondary | ICD-10-CM

## 2015-05-18 DIAGNOSIS — Z7902 Long term (current) use of antithrombotics/antiplatelets: Secondary | ICD-10-CM | POA: Insufficient documentation

## 2015-05-18 DIAGNOSIS — I129 Hypertensive chronic kidney disease with stage 1 through stage 4 chronic kidney disease, or unspecified chronic kidney disease: Secondary | ICD-10-CM | POA: Insufficient documentation

## 2015-05-18 DIAGNOSIS — I272 Other secondary pulmonary hypertension: Secondary | ICD-10-CM | POA: Insufficient documentation

## 2015-05-18 DIAGNOSIS — Z9981 Dependence on supplemental oxygen: Secondary | ICD-10-CM | POA: Diagnosis not present

## 2015-05-18 DIAGNOSIS — I2510 Atherosclerotic heart disease of native coronary artery without angina pectoris: Secondary | ICD-10-CM | POA: Diagnosis not present

## 2015-05-18 DIAGNOSIS — Z66 Do not resuscitate: Secondary | ICD-10-CM | POA: Diagnosis not present

## 2015-05-18 DIAGNOSIS — I481 Persistent atrial fibrillation: Secondary | ICD-10-CM | POA: Diagnosis not present

## 2015-05-18 DIAGNOSIS — E1165 Type 2 diabetes mellitus with hyperglycemia: Secondary | ICD-10-CM | POA: Insufficient documentation

## 2015-05-18 DIAGNOSIS — I5033 Acute on chronic diastolic (congestive) heart failure: Secondary | ICD-10-CM

## 2015-05-18 DIAGNOSIS — I5032 Chronic diastolic (congestive) heart failure: Secondary | ICD-10-CM | POA: Diagnosis not present

## 2015-05-18 DIAGNOSIS — Z8249 Family history of ischemic heart disease and other diseases of the circulatory system: Secondary | ICD-10-CM | POA: Insufficient documentation

## 2015-05-18 DIAGNOSIS — G4733 Obstructive sleep apnea (adult) (pediatric): Secondary | ICD-10-CM | POA: Insufficient documentation

## 2015-05-18 DIAGNOSIS — N189 Chronic kidney disease, unspecified: Secondary | ICD-10-CM | POA: Insufficient documentation

## 2015-05-18 DIAGNOSIS — N183 Chronic kidney disease, stage 3 unspecified: Secondary | ICD-10-CM

## 2015-05-18 DIAGNOSIS — I959 Hypotension, unspecified: Secondary | ICD-10-CM | POA: Insufficient documentation

## 2015-05-18 DIAGNOSIS — E1122 Type 2 diabetes mellitus with diabetic chronic kidney disease: Secondary | ICD-10-CM | POA: Insufficient documentation

## 2015-05-18 DIAGNOSIS — I4891 Unspecified atrial fibrillation: Secondary | ICD-10-CM

## 2015-05-18 DIAGNOSIS — Z79899 Other long term (current) drug therapy: Secondary | ICD-10-CM | POA: Insufficient documentation

## 2015-05-18 LAB — BASIC METABOLIC PANEL
Anion gap: 9 (ref 5–15)
BUN: 77 mg/dL — AB (ref 6–20)
CO2: 37 mmol/L — ABNORMAL HIGH (ref 22–32)
CREATININE: 2.18 mg/dL — AB (ref 0.61–1.24)
Calcium: 9.1 mg/dL (ref 8.9–10.3)
Chloride: 95 mmol/L — ABNORMAL LOW (ref 101–111)
GFR calc Af Amer: 34 mL/min — ABNORMAL LOW (ref >60)
GFR, EST NON AFRICAN AMERICAN: 29 mL/min — AB (ref >60)
GLUCOSE: 173 mg/dL — AB (ref 65–99)
POTASSIUM: 4.2 mmol/L (ref 3.5–5.1)
Sodium: 141 mmol/L (ref 135–145)

## 2015-05-18 LAB — CBC
HEMATOCRIT: 28.9 % — AB (ref 39.0–52.0)
Hemoglobin: 8.5 g/dL — ABNORMAL LOW (ref 13.0–17.0)
MCH: 25.8 pg — ABNORMAL LOW (ref 26.0–34.0)
MCHC: 29.4 g/dL — AB (ref 30.0–36.0)
MCV: 87.6 fL (ref 78.0–100.0)
Platelets: 157 10*3/uL (ref 150–400)
RBC: 3.3 MIL/uL — ABNORMAL LOW (ref 4.22–5.81)
RDW: 17.2 % — AB (ref 11.5–15.5)
WBC: 8.5 10*3/uL (ref 4.0–10.5)

## 2015-05-18 LAB — TSH: TSH: 12.066 u[IU]/mL — ABNORMAL HIGH (ref 0.350–4.500)

## 2015-05-18 MED ORDER — POTASSIUM CHLORIDE CRYS ER 20 MEQ PO TBCR: 20.0000 meq | EXTENDED_RELEASE_TABLET | ORAL | Status: DC

## 2015-05-18 MED ORDER — METOLAZONE 2.5 MG PO TABS: 2.5000 mg | ORAL_TABLET | ORAL | Status: DC

## 2015-05-18 MED ORDER — FUROSEMIDE 80 MG PO TABS: 80.0000 mg | ORAL_TABLET | Freq: Two times a day (BID) | ORAL | Status: DC

## 2015-05-18 MED ORDER — TORSEMIDE 20 MG PO TABS: ORAL_TABLET | ORAL | Status: DC

## 2015-05-18 NOTE — Patient Instructions (Signed)
Stop Torsemide  Start Furosemide (Lasix) 80 mg Twice daily   Increase Metolazone to 2.5 mg every other day   Increase Potassium to 20 meq every other day   Labs today  Please wear compression hose ALL DAY, remove at night  Your physician recommends that you schedule a follow-up appointment in: 1 week with labs

## 2015-05-18 NOTE — Telephone Encounter (Signed)
Per Dr Shirlee Latch, he would like to void the orders he wrote on pt's labs and instead have pt D/C Lasix, no diuretics for 2 days then restart Torsemide 60 mg in AM and 40 mg in PM, decrease Metolazone to 2.5 mg every Mon and Friday only STARTING on Monday, decrease potassium to 20 meq every Mon and Fri.  Spoke w/pt's caregiver Waller Sink, she is aware of changes and verbalizes understanding, med list updated

## 2015-05-18 NOTE — Progress Notes (Signed)
Patient ID: Mark Roberson, male   DOB: [birth year], 69 y.o.   MRN: [id number]  PCP: Dr. 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  He was seen by a cardiologist up in [place], but moved earlier this year to [facility].  He had PCI back in the 1990s, no cath since the '90s.  He had a successful TEE-guided DCCV for atrial fibrillation in [place] in 2/16.  Later in 2/16, he moved to [facility].  He was admitted to [facility] with acute on chronic diastolic CHF.  He was back in atrial fibrillation.  He was admitted again in 5/16 with dyspnea, weakness, and edema.  He was diuresed. He had epistaxis and Xarelto was briefly held.  He had tooth abscesses treated.  DCCV in 7/16 led to brief NSR but he reverted to atrial fibrillation.   He was admitted in 8/16 with acute on chronic diastolic CHF and diuresed well with IV Lasix.  HR was low, Coreg was stopped.  He was sent home last week.  At home, BP has been running low.  It is 90/48 today.  HR in 50s.  He is not on any BP-active meds other than torsemide.  He has mild lightheadedness and feels fatigued generally.  No fever, no dysuria, no abdominal pain.  He denies dyspnea but is not moving much at all.  Weight is up 7 lbs since going home.  He says that he did better taking po Lasix than torsemide.   Labs (2/16): LDL 64 Labs (5/16): K 4.5, creatinine 1.81, Hgb 8.8 Labs (6/16): K 4.1 Creatinine 1.32 , HCT 28.6, BNP 223 Labs (7/16): K 3.3, Creatinine 1.84, HCT 26 Labs (8/16): K 3.8, creatinine 1.64  PMH: 1. CKD: Suspect diabetic nephropathy plays a role.  2. Diabetes 3. OSA: Severe on 5/16 sleep study.  Not yet on CPAP.  4. Chronic diastolic CHF: Echo (2/16) with EF 60-65%, mild MR, moderately dilated RV with normal systolic function, PA systolic pressure 50 mmHg.  5. Atrial fibrillation: Now persistent.  He had successful TEE-guided DCCV in 2/16 in [place] but atrial  fibrillation recurred not long after. DCCV 7/16 on amiodarone => atrial fibrillation quickly recurred.  6. 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  He is on home oxygen.  8. CAD: s/p PCI with DES in the 1990s.  No cardiac cath since 1990s.  9. Anemia 10. 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 12. Bradycardia  SH: Living with friends in [place], originally from [place] (moved to [place] in 2016).  Nonsmoker.  No ETOH.   FH: CAD  ROS: All systems reviewed and negative except as per HPI.   Current Outpatient Prescriptions  Medication Sig Dispense Refill  . acetaminophen (TYLENOL) 500 MG tablet Take 500 mg by mouth every 4 (four) hours as needed (pain). Do not exceed 4 gms of tylenol in 24 hours    . albuterol (PROVENTIL) (2.5 MG/3ML) 0.083% nebulizer solution Take 3 mLs (2.5 mg total) by nebulization every 6 (six) hours as needed for wheezing or shortness of breath. 75 mL 1  . atorvastatin (LIPITOR) 20 MG tablet Take 20 mg by mouth at bedtime.     . Cholecalciferol (VITAMIN D) 2000 UNITS tablet Take 2,000 Units by mouth daily.    Marland Kitchen gabapentin (NEURONTIN) 300 MG capsule Take 300 mg by mouth 3 (three) times daily.    . insulin aspart (NOVOLOG FLEXPEN) 100 UNIT/ML FlexPen Inject 30 Units  into the skin 3 (three) times daily before meals. Check blood glucose before meals    . Insulin Glargine (LANTUS SOLOSTAR) 100 UNIT/ML Solostar Pen Inject 50 Units into the skin at bedtime.     . Magnesium 250 MG TABS Take 500 mg by mouth daily. 2 by mouth daily    . metolazone (ZAROXOLYN) 2.5 MG tablet Take 1 tablet (2.5 mg total) by mouth every other day. 15 tablet 3  . Multiple Vitamins-Minerals (DECUBI-VITE) CAPS Take 1 capsule by mouth daily.    [name] 3-6-9 CAPS Take 2 capsules by mouth daily.    . potassium chloride SA (K-DUR,KLOR-CON) 20 MEQ tablet Take 1 tablet (20 mEq total) by mouth every other day. 30 tablet 3  . PROAIR HFA 108 (90 BASE) MCG/ACT inhaler INHALE 1 TO 2 PUFFS BY MOUTH FOUR TIMES DAILY AS DIRECTED AS NEEDED FOR  WHEEZING AND SHORTNESS OF BREATH 8.5 g 0  . Rivaroxaban (XARELTO) 15 MG TABS tablet Take 1 tablet (15 mg total) by mouth daily with supper. (Patient taking differently: Take 15 mg by mouth daily with breakfast. 9am) 30 tablet   . furosemide (LASIX) 80 MG tablet Take 1 tablet (80 mg total) by mouth 2 (two) times daily. 60 tablet 3   No current facility-administered medications for this encounter.   BP 90/48 mmHg  Pulse 55  Wt 336 lb 6.4 oz (152.59 kg)  SpO2 96% General: NAD, obese. Arrived in wheelchair.  Neck: Thick, JVP 9-10 cm with HJR, no thyromegaly or thyroid nodule.  Lungs: Slight crackles at bases bilaterally.  CV: Nondisplaced PMI.  Irregular S1/S2, no S3/S4, no murmur.  1+ edema to knees bilaterally.  No carotid bruit.  Normal pedal pulses.  Abdomen: Soft, nontender, no hepatosplenomegaly, no distention.  Skin: Intact without lesions or rashes.  Neurologic: Alert and oriented x 3.  Psych: Normal affect. Extremities: No clubbing or cyanosis.  HEENT: Normal.   Assessment/Plan: 1. Chronic diastolic CHF: EF 16-10% on last echo with RV enlargement.  PA pressure elevated by echo.  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  He was recently in the hospital for diuresis with weight down 6 lbs on our scales since last appointment but up 7 lbs per his scale since discharge.  He is hypotensive.  His exam is very difficult for volume.  He is concerned that he is not urinating as much.  Labs were drawn today, creatinine came back much higher at 2.7.  Suspect he may be overdiuresed, leading to hypotension and AKI.  - Hold diuretics x 2 days, then go back to torsemide 60 qam/40 qpm after that.  - No metolazone this Friday, restart metolazone on Monday (use twice as week as before).  - Needs repeat BMET Monday.   2. Pulmonary hypertension:  Elevated PA pressure on last echo.  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  This will need to be treated by oxygen, CPAP, and diuresis.  3. Atrial fibrillation: This has been persistent, probably since 2/16.  He has remained in atrial fibrillation despite DCCV attempt 7/16 on amiodarone.  Amiodarone has been stopped.  Coreg stopped as well due to bradycardia.  - Continue Xarelto 15 mg daily (GFR < 50).  4. OSA/suspected OHS: Continue oxygen continuously.  He will need to start on CPAP given severe OSA. Referred  to pulmonary. Needs to reschedule appt with pulmonary.  5. CAD: Stable, no chest pain.  He is on Xarelto with stable CAD so not on aspirin.  Continue statin, good lipids earlier this year.  6. CKD: Creatinine up considerably, think overdiuresed.  See changes above.  7. Uncontrolled DM:  Endocrinology following per pt.  8. Hypotension: Think over-diuresis.  See changes above.  Wear graded compression stockings during the day.  9. DNR   Close followup, need to see next week.   Marca Ancona  05/18/2015

## 2015-05-22 ENCOUNTER — Non-Acute Institutional Stay (SKILLED_NURSING_FACILITY): Payer: Medicare Other | Admitting: Adult Health

## 2015-05-22 DIAGNOSIS — N183 Chronic kidney disease, stage 3 unspecified: Secondary | ICD-10-CM

## 2015-05-22 DIAGNOSIS — E1121 Type 2 diabetes mellitus with diabetic nephropathy: Secondary | ICD-10-CM

## 2015-05-22 DIAGNOSIS — I5033 Acute on chronic diastolic (congestive) heart failure: Secondary | ICD-10-CM | POA: Diagnosis not present

## 2015-05-22 DIAGNOSIS — E039 Hypothyroidism, unspecified: Secondary | ICD-10-CM | POA: Diagnosis not present

## 2015-05-22 DIAGNOSIS — E785 Hyperlipidemia, unspecified: Secondary | ICD-10-CM

## 2015-05-22 DIAGNOSIS — G8929 Other chronic pain: Secondary | ICD-10-CM | POA: Diagnosis not present

## 2015-05-22 DIAGNOSIS — I4891 Unspecified atrial fibrillation: Secondary | ICD-10-CM | POA: Diagnosis not present

## 2015-05-22 DIAGNOSIS — I270 Primary pulmonary hypertension: Secondary | ICD-10-CM

## 2015-05-22 DIAGNOSIS — G4733 Obstructive sleep apnea (adult) (pediatric): Secondary | ICD-10-CM | POA: Diagnosis not present

## 2015-05-22 DIAGNOSIS — R5381 Other malaise: Secondary | ICD-10-CM

## 2015-05-22 DIAGNOSIS — I272 Pulmonary hypertension, unspecified: Secondary | ICD-10-CM

## 2015-05-22 MED ORDER — LEVOTHYROXINE SODIUM 25 MCG PO TABS: 25.0000 ug | ORAL_TABLET | Freq: Every day | ORAL | Status: DC

## 2015-05-22 NOTE — Addendum Note (Signed)
Addended by: Sharee Holster on: 05/22/2015 03:01 PM   Modules accepted: Orders

## 2015-05-22 NOTE — Progress Notes (Addendum)
Patient ID: Mark Roberson, male   DOB: 11-29-1945, 69 y.o.   MRN: [id number]   Facility: [facility]      Allergies  Allergen Reactions  . Percocet [Oxycodone-Acetaminophen] Other (See Comments)    hallucination  . Penicillins Hives    Chief Complaint  Patient presents with  . Hospitalization Follow-up    HPI:  He has recently been hospitalized for chronic diastolic heart failure. He is not voicing any complaints or concerns at this time. There are no nursing concerns at this time. He is here for short term rehab with his goal to return back home.    Past Medical History  Diagnosis Date  . CHF (congestive heart failure)   . Atrial fibrillation   . Diabetes mellitus without complication   . Hypertension   . Sleep apnea   . Asthma   . CKD (chronic kidney disease), stage III   . Anemia     Past Surgical History  Procedure Laterality Date  . 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 Wound debridement Right   . Multiple extractions with alveoloplasty N/A 01/17/2015    Procedure: Extraction of tooth #'s 14,15, 16 with alveolopalsty;  Surgeon: Charlynne Pander, DDS;  Location: [facility] OR;  Service: Oral Surgery;  Laterality: N/A;  . Cardioversion N/A 04/03/2015    Procedure: CARDIOVERSION;  Surgeon: Laurey Morale, MD;  Location: [facility] ENDOSCOPY;  Service: Cardiovascular;  Laterality: N/A;    VITAL SIGNS BP 140/60 mmHg  Pulse 70  Ht 6' (1.829 m)  Wt 325 lb (147.419 kg)  BMI 44.07 kg/m2  Patient's Medications  New Prescriptions   No medications on file  Previous Medications   ACETAMINOPHEN (TYLENOL) 500 MG TABLET    Take 500 mg by mouth every 4 (four) hours as needed (pain). Do not exceed 4 gms of tylenol in 24 hours   ALBUTEROL (PROVENTIL) (2.5 MG/3ML) 0.083% NEBULIZER SOLUTION    Take 3 mLs (2.5 mg total) by nebulization every 6 (six) hours as needed for wheezing or shortness of breath.   ATORVASTATIN (LIPITOR) 20 MG TABLET    Take 20 mg by mouth at bedtime.      CHOLECALCIFEROL (VITAMIN D) 2000 UNITS TABLET    Take 2,000 Units by mouth daily.   GABAPENTIN (NEURONTIN) 300 MG CAPSULE    Take 300 mg by mouth 3 (three) times daily.   INSULIN ASPART (NOVOLOG FLEXPEN) 100 UNIT/ML FLEXPEN    Inject 30 Units into the skin 3 (three) times daily before meals. Check blood glucose before meals   INSULIN GLARGINE (LANTUS SOLOSTAR) 100 UNIT/ML SOLOSTAR PEN    Inject 50 Units into the skin at bedtime.    MAGNESIUM 250 MG TABS    Take 500 mg by mouth daily. 2 by mouth daily   METOLAZONE (ZAROXOLYN) 2.5 MG TABLET    Take 1 tablet (2.5 mg total) by mouth 2 (two) times a week. Every Mon and Fri   MULTIPLE VITAMINS-MINERALS (DECUBI-VITE) CAPS    Take 1 capsule by mouth daily.   OMEGA 3-6-9 CAPS    Take 2 capsules by mouth daily.   POTASSIUM CHLORIDE SA (K-DUR,KLOR-CON) 20 MEQ TABLET    Take 1 tablet (20 mEq total) by mouth 2 (two) times a week. Every Mon and Fri with metolazone   PROAIR HFA 108 (90 BASE) MCG/ACT INHALER    INHALE 1 TO 2 PUFFS BY MOUTH FOUR TIMES DAILY AS DIRECTED AS NEEDED FOR WHEEZING AND SHORTNESS OF BREATH  RIVAROXABAN (XARELTO) 15 MG TABS TABLET    Take 1 tablet (15 mg total) by mouth daily with supper.   TORSEMIDE (DEMADEX) 20 MG TABLET    Take 3 tabs in AM and 2 tabs in PM  Modified Medications   No medications on file  Discontinued Medications   No medications on file     SIGNIFICANT DIAGNOSTIC EXAMS  05-02-15: chest x-ray: Interstitial pulmonary edema compatible with mild CHF.  05-03-15: 2-d echo: Left ventricle: The cavity size was normal. Wall thickness was normal. Systolic function was vigorous. The estimated ejection fraction was in the range of 65% to 70%. Wall motion was normal; there were no regional wall motion abnormalities. Doppler parameters are consistent with elevated mean left atrial fillingpressure. - Aortic valve: Valve area (Vmax): 2.71 cm^2. - Mitral valve: Moderately calcified annulus. There was mild to moderate  regurgitation. Valve area by pressure half-time: 1.71 cm^2. Valve area by continuity equation (using LVOT flow): 2.39 cm^2. - Left atrium: The atrium was severely dilated. - Right ventricle: The cavity size was mildly dilated. - Right atrium: The atrium was moderately to severely dilated. - Pulmonary arteries: Systolic pressure was moderately increased. PA peak pressure: 58 mm Hg (S).  05-04-15:renal ultrasound: Small left kidney with renal cortical thinning compared to the normal-appearing right kidney. This size discrepancy raises question of renal artery stenosis on the left. In this regard, question whether patient is hypertensive. Study otherwise unremarkable.   LABS REVIEWED:   05-02-15: wbc 8.5; hgb 8.2; hct 28.4; mcv 86.6 ;pt 173; glucose 212; bun 83; creat 2.80; k+4.0  na++134; mag 2.8; BNP 284.3 05-06-15: wbc 7.7; hgb 83; hct 28.0; mcv 86.2; plt 179; glucose 69; bun 72; creat 1.93; k+4.0; na++137;  05-18-15: wbc 8.5; hgb 8.5; hct 28.9; mcv 87.6; plt 157; glucose 173; bun 77; creat 2.18; k+4.2; na++141; tsh 12.066        Review of Systems  Constitutional: Negative for appetite change and fatigue.  HENT: Negative for congestion.   Respiratory: Negative for cough, chest tightness and shortness of breath.   Cardiovascular: Negative for chest pain, palpitations and leg swelling.  Gastrointestinal: Negative for nausea, abdominal pain, diarrhea and constipation.  Musculoskeletal: Negative for myalgias and arthralgias.  Skin: Negative for pallor.  Neurological: Negative for dizziness.  Psychiatric/Behavioral: The patient is not nervous/anxious.       Physical Exam  Constitutional: He is oriented to person, place, and time. No distress.  Obese   Eyes: Conjunctivae are normal.  Neck: Neck supple. No JVD present. No thyromegaly present.  Cardiovascular: Normal rate, regular rhythm and intact distal pulses.   Respiratory: Effort normal and breath sounds normal. No respiratory  distress. He has no wheezes.  GI: Soft. Bowel sounds are normal. He exhibits no distension. There is no tenderness.  Musculoskeletal: He exhibits edema.  Able to move all extremities  Has 1+ lower extremity edema   Lymphadenopathy:    He has no cervical adenopathy.  Neurological: He is alert and oriented to person, place, and time.  Skin: Skin is warm and dry. He is not diaphoretic.  Psychiatric: He has a normal mood and affect.     ASSESSMENT/ PLAN:  1. Diastolic heart failure: he has seen cardiology 05-18-15; will stop his lasix and will restart his demadex at 60 mg in the AM and 40 mg in the PM. Will continue metolazone 2.5 on Monday and Friday with k+ 20 meq on Monday and Friday will continue daily weights  2. Afib: his amiodarone  and coreg were discontinued by cardiology due to bradycardia; will continue xarelto 15 mg daily due to his GFR <50; his heart rate is stable at this time.  3. Diabetes: will continue lantus 50 units nightly and will continue novolog 30 units with meals with an additional 3 units for cbg >=150 will monitor   4. Dyslipidemia: will continue lipitor 20 mg daily with fish oil daily   5. CKD stage III: his creat is stable at 2.18; will monitor  6.  Chronic pain: his pain in his legs is presently stable will continue neurontin 300 mg three times daily will monitor  7. OSA: is without change in status;he has pulmonary hypertension as well;  will setup pulmonology consult for cpap;   8. Physical deconditioning: will continue as directed to improve upon his gait; balance and adl independence and will monitor his status.   9. Hypothyroidism: his tsh is 12.066; will begin synthroid 25 mcg daily and will check tsh and free t4 in 6 weeks    Time spent with patient  50  minutes >50% time spent counseling; reviewing medical record; tests; labs; and developing future plan of care   Synthia Innocent NP [facility]  Contact [phone] Monday through  Friday 8am- 5pm  After hours call [phone]

## 2015-05-23 ENCOUNTER — Encounter: Payer: Self-pay | Admitting: Internal Medicine

## 2015-05-23 ENCOUNTER — Non-Acute Institutional Stay (SKILLED_NURSING_FACILITY): Payer: Medicare Other | Admitting: Internal Medicine

## 2015-05-23 ENCOUNTER — Other Ambulatory Visit: Payer: Self-pay | Admitting: Cardiology

## 2015-05-23 DIAGNOSIS — I5032 Chronic diastolic (congestive) heart failure: Secondary | ICD-10-CM

## 2015-05-23 DIAGNOSIS — E1121 Type 2 diabetes mellitus with diabetic nephropathy: Secondary | ICD-10-CM | POA: Diagnosis not present

## 2015-05-23 DIAGNOSIS — R600 Localized edema: Secondary | ICD-10-CM | POA: Diagnosis not present

## 2015-05-23 DIAGNOSIS — E785 Hyperlipidemia, unspecified: Secondary | ICD-10-CM

## 2015-05-23 DIAGNOSIS — I272 Pulmonary hypertension, unspecified: Secondary | ICD-10-CM

## 2015-05-23 DIAGNOSIS — G8929 Other chronic pain: Secondary | ICD-10-CM | POA: Diagnosis not present

## 2015-05-23 DIAGNOSIS — E039 Hypothyroidism, unspecified: Secondary | ICD-10-CM

## 2015-05-23 DIAGNOSIS — I270 Primary pulmonary hypertension: Secondary | ICD-10-CM | POA: Diagnosis not present

## 2015-05-23 DIAGNOSIS — N183 Chronic kidney disease, stage 3 unspecified: Secondary | ICD-10-CM

## 2015-05-23 DIAGNOSIS — G4733 Obstructive sleep apnea (adult) (pediatric): Secondary | ICD-10-CM

## 2015-05-23 DIAGNOSIS — I480 Paroxysmal atrial fibrillation: Secondary | ICD-10-CM | POA: Diagnosis not present

## 2015-05-23 DIAGNOSIS — R5381 Other malaise: Secondary | ICD-10-CM

## 2015-05-23 NOTE — Progress Notes (Signed)
Patient ID: Mark Roberson, male   DOB: 16-May-1946, 69 y.o.   MRN: [id number]    HISTORY AND PHYSICAL   DATE:  05/23/15  Location:  [facility] Service: SNF [phone])   Extended Emergency Contact Information Primary Emergency Contact: Cope,Rich Address: [address]          [place], [place] [address] Johnnette Litter of [place] Phone: [phone] Mobile Phone: [phone] Relation: Friend Secondary Emergency Contact: [place], [place] of [place] Phone: [phone] Relation: Niece  Advanced Directive information  DNR  Chief Complaint  Patient presents with  . New Admit To SNF    HPI:  69 yo male seen today as a new admission into SNF from home. He recently had A/C diastolic HF with cardiorenal syndrome, afib, A/CKD stage 3 and BP fluctuations. He also has a hx DM 2 with nephropathy, neuropathy, peripheral edema, anemia, pulmonary HTN on chronic [place] O2 at home and hypothyroidism. He is followed at HF clinic. Recent labs show Cr 2.8 -->1.6 (baseline), A1c 7.4%. 2 D echo shows EF 65%, enlarged right ventricle and elevcated PA pressures. He has been tx with IV diuretics at recent hospital admission.  Today, he is c/a weight although he cannot tell me if he has gained. No other concerns. He has chronic SOB. No CP. No f/c. No nursing issues. He is eating/sleeping well  DM2 - CBG 182 today. No low BS reactions. He tkes lantus and novolog. Gabapentin helps neuropathy  Hypothyroid - takes levothyroxine. TSH 12.066 on 8/25th  CHF - currently takes demadex BID and zaroxolyn 2 times a week. He takes a statin and potassium supplement  OSA/pulmonary HTN - he uses HFA prn  afib - rate is slow and he does not take rate controlling med. He does take xeralto  He takes several vitamins/minerals   Past Medical History  Diagnosis Date  . CHF (congestive heart failure)   . Atrial fibrillation   . Diabetes mellitus  without complication   . Hypertension   . Sleep apnea   . Asthma   . CKD (chronic kidney disease), stage III   . Anemia     Past Surgical History  Procedure Laterality Date  . 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 Wound debridement Right   . Multiple extractions with alveoloplasty N/A 01/17/2015    Procedure: Extraction of tooth #'s 14,15, 16 with alveolopalsty;  Surgeon: Lenn Cal, DDS;  Location: [facility];  Service: Oral Surgery;  Laterality: N/A;  . Cardioversion N/A 04/03/2015    Procedure: CARDIOVERSION;  Surgeon: Larey Dresser, MD;  Location: [facility];  Service: Cardiovascular;  Laterality: N/A;    Patient Care Team: Olga Millers, MD as PCP - General (Internal Medicine)  Social History   Social History  . Marital Status: Single    Spouse Name: N/A  . Number of Children: N/A  . Years of Education: N/A   Occupational History  . Not on file.   Social History Main Topics  . Smoking status: Never Smoker   . Smokeless tobacco: Never Used  . Alcohol Use: Yes     Comment: rare  . Drug Use: No  . Sexual Activity: Not on file   Other Topics Concern  . Not on file   Social History Narrative     reports that he has never smoked. He has never used  smokeless tobacco. He reports that he drinks alcohol. He reports that he does not use illicit drugs.  Family History  Problem Relation Age of Onset  . COPD Mother   . Heart disease Father   . Cancer Sister     breast  . Heart disease Brother   . Diabetes Neg Hx    No family status information on file.    Immunization History  Administered Date(s) Administered  . Influenza,inj,Quad PF,36+ Mos 07/07/2014  . PPD Test 01/31/2015  . Pneumococcal Polysaccharide-23 07/07/2014    Allergies  Allergen Reactions  . Percocet [Oxycodone-Acetaminophen] Other (See Comments)    hallucination  . Penicillins Hives    Medications: Patient's Medications  New Prescriptions   No medications on file    Previous Medications   ACETAMINOPHEN (TYLENOL) 500 MG TABLET    Take 500 mg by mouth every 4 (four) hours as needed (pain). Do not exceed 4 gms of tylenol in 24 hours   ALBUTEROL (PROVENTIL) (2.5 MG/3ML) 0.083% NEBULIZER SOLUTION    Take 3 mLs (2.5 mg total) by nebulization every 6 (six) hours as needed for wheezing or shortness of breath.   ATORVASTATIN (LIPITOR) 20 MG TABLET    Take 20 mg by mouth at bedtime.    CHOLECALCIFEROL (VITAMIN D) 2000 UNITS TABLET    Take 2,000 Units by mouth daily.   GABAPENTIN (NEURONTIN) 300 MG CAPSULE    Take 300 mg by mouth 3 (three) times daily.   INSULIN ASPART (NOVOLOG FLEXPEN) 100 UNIT/ML FLEXPEN    Inject 30 Units into the skin 3 (three) times daily before meals. Check blood glucose before meals   INSULIN GLARGINE (LANTUS SOLOSTAR) 100 UNIT/ML SOLOSTAR PEN    Inject 50 Units into the skin at bedtime.    LEVOTHYROXINE (LEVOTHROID) 25 MCG TABLET    Take 1 tablet (25 mcg total) by mouth daily before breakfast.   MAGNESIUM 250 MG TABS    Take 500 mg by mouth daily. 2 by mouth daily   METOLAZONE (ZAROXOLYN) 2.5 MG TABLET    Take 1 tablet (2.5 mg total) by mouth 2 (two) times a week. Every Mon and Fri   MULTIPLE VITAMINS-MINERALS (DECUBI-VITE) CAPS    Take 1 capsule by mouth daily.   OMEGA 3-6-9 CAPS    Take 2 capsules by mouth daily.   POTASSIUM CHLORIDE SA (K-DUR,KLOR-CON) 20 MEQ TABLET    Take 1 tablet (20 mEq total) by mouth 2 (two) times a week. Every Mon and Fri with metolazone   PROAIR HFA 108 (90 BASE) MCG/ACT INHALER    INHALE 1 TO 2 PUFFS BY MOUTH FOUR TIMES DAILY AS DIRECTED AS NEEDED FOR WHEEZING AND SHORTNESS OF BREATH   RIVAROXABAN (XARELTO) 15 MG TABS TABLET    Take 1 tablet (15 mg total) by mouth daily with supper.   TORSEMIDE (DEMADEX) 20 MG TABLET    Take 3 tabs in AM and 2 tabs in PM  Modified Medications   No medications on file  Discontinued Medications   No medications on file    Review of Systems  Constitutional: Negative for chills,  activity change and fatigue.  HENT: Negative for sore throat and trouble swallowing.   Eyes: Negative for visual disturbance.  Respiratory: Negative for cough, chest tightness and shortness of breath.   Cardiovascular: Negative for chest pain, palpitations and leg swelling.  Gastrointestinal: Negative for nausea, vomiting, abdominal pain and blood in stool.  Genitourinary: Negative for urgency, frequency and difficulty urinating.  Musculoskeletal: Negative for  arthralgias and gait problem.  Skin: Negative for rash.  Neurological: Positive for numbness. Negative for weakness and headaches.  Psychiatric/Behavioral: Negative for confusion and sleep disturbance. The patient is not nervous/anxious.     Filed Vitals:   05/23/15 1838  BP: 149/56  Pulse: 56  Temp: 98.1 F (36.7 C)  Weight: 328 lb (148.78 kg)   Body mass index is 44.48 kg/(m^2).  Physical Exam  Constitutional: He is oriented to person, place, and time. He appears well-developed and well-nourished.  Lying in bed in NAD. [place] O2 intact. No conversational dyspnea  HENT:  Mouth/Throat: Oropharynx is clear and moist.  Eyes: Pupils are equal, round, and reactive to light. No scleral icterus.  Neck: Neck supple. No JVD present. Carotid bruit is not present.  Cardiovascular: Regular rhythm, normal heart sounds and intact distal pulses.   Occasional extrasystoles are present. Bradycardia present.  Exam reveals no gallop, no S4 and no friction rub.   No murmur heard. Trace LE edema b/l no calf TTP. TED stockings intact  Pulmonary/Chest: Effort normal. He has decreased breath sounds. He has no wheezes. He has no rales. He exhibits no tenderness.  Abdominal: Soft. Bowel sounds are normal. He exhibits no distension, no abdominal bruit, no pulsatile midline mass and no mass. There is no tenderness. There is no rebound and no guarding.  obese  Lymphadenopathy:    He has no cervical adenopathy.  Neurological: He is alert and oriented to  person, place, and time.  Skin: Skin is warm and dry. No rash noted.  Psychiatric: His behavior is normal. Thought content normal. His affect is blunt.     Labs reviewed: Hospital Outpatient Visit on 05/18/2015  Component Date Value Ref Range Status  . Sodium 05/18/2015 141  135 - 145 mmol/L Final  . Potassium 05/18/2015 4.2  3.5 - 5.1 mmol/L Final  . Chloride 05/18/2015 95* 101 - 111 mmol/L Final  . CO2 05/18/2015 37* 22 - 32 mmol/L Final  . Glucose, Bld 05/18/2015 173* 65 - 99 mg/dL Final  . BUN 05/18/2015 77* 6 - 20 mg/dL Final  . Creatinine, Ser 05/18/2015 2.18* 0.61 - 1.24 mg/dL Final  . Calcium 05/18/2015 9.1  8.9 - 10.3 mg/dL Final  . GFR calc non Af Amer 05/18/2015 29* >60 mL/min Final  . GFR calc Af Amer 05/18/2015 34* >60 mL/min Final   Comment: (NOTE) The eGFR has been calculated using the CKD EPI equation. This calculation has not been validated in all clinical situations. eGFR's persistently <60 mL/min signify possible Chronic Kidney Disease.   . Anion gap 05/18/2015 9  5 - 15 Final  . WBC 05/18/2015 8.5  4.0 - 10.5 K/uL Final  . RBC 05/18/2015 3.30* 4.22 - 5.81 MIL/uL Final  . Hemoglobin 05/18/2015 8.5* 13.0 - 17.0 g/dL Final  . HCT 05/18/2015 28.9* 39.0 - 52.0 % Final  . MCV 05/18/2015 87.6  78.0 - 100.0 fL Final  . MCH 05/18/2015 25.8* 26.0 - 34.0 pg Final  . MCHC 05/18/2015 29.4* 30.0 - 36.0 g/dL Final  . RDW 05/18/2015 17.2* 11.5 - 15.5 % Final  . Platelets 05/18/2015 157  150 - 400 K/uL Final  . TSH 05/18/2015 12.066* 0.350 - 4.500 uIU/mL Final  Admission on 05/02/2015, Discharged on 05/14/2015  No results displayed because visit has over 200 results.    Hospital Outpatient Visit on 04/27/2015  Component Date Value Ref Range Status  . Sodium 04/27/2015 139  135 - 145 mmol/L Final  . Potassium 04/27/2015 4.1  3.5 - 5.1 mmol/L Final  . Chloride 04/27/2015 91* 101 - 111 mmol/L Final  . CO2 04/27/2015 41* 22 - 32 mmol/L Final  . Glucose, Bld 04/27/2015 207*  65 - 99 mg/dL Final  . BUN 04/27/2015 49* 6 - 20 mg/dL Final  . Creatinine, Ser 04/27/2015 2.03* 0.61 - 1.24 mg/dL Final  . Calcium 04/27/2015 9.3  8.9 - 10.3 mg/dL Final  . Total Protein 04/27/2015 7.1  6.5 - 8.1 g/dL Final  . Albumin 04/27/2015 3.1* 3.5 - 5.0 g/dL Final  . AST 04/27/2015 24  15 - 41 U/L Final  . ALT 04/27/2015 16* 17 - 63 U/L Final  . Alkaline Phosphatase 04/27/2015 74  38 - 126 U/L Final  . Total Bilirubin 04/27/2015 0.8  0.3 - 1.2 mg/dL Final  . GFR calc non Af Amer 04/27/2015 32* >60 mL/min Final  . GFR calc Af Amer 04/27/2015 37* >60 mL/min Final   Comment: (NOTE) The eGFR has been calculated using the CKD EPI equation. This calculation has not been validated in all clinical situations. eGFR's persistently <60 mL/min signify possible Chronic Kidney Disease.   . Anion gap 04/27/2015 7  5 - 15 Final  . TSH 04/27/2015 7.615* 0.350 - 4.500 uIU/mL Final  Nursing Home on 04/12/2015  Component Date Value Ref Range Status  . Hemoglobin 04/03/2015 8.0* 13.5 - 17.5 g/dL Final  . HCT 04/03/2015 26* 41 - 53 % Final  . Platelets 04/03/2015 199  150 - 399 K/L Final  . WBC 04/03/2015 9.8   Final  . Glucose 03/28/2015 333   Final  . BUN 03/28/2015 75* 4 - 21 mg/dL Final  . Creatinine 03/28/2015 1.8* .6 - 1.3 mg/dL Final  . Sodium 03/28/2015 135* 137 - 147 mmol/L Final  . Alkaline Phosphatase 03/28/2015 84  25 - 125 U/L Final  . ALT 03/28/2015 24  10 - 40 U/L Final  . AST 03/28/2015 28  14 - 40 U/L Final  . Bilirubin, Total 03/28/2015 0.5   Final  Admission on 04/03/2015, Discharged on 04/03/2015  Component Date Value Ref Range Status  . Glucose-Capillary 04/03/2015 160* 65 - 99 mg/dL Final  Office Visit on 03/29/2015  Component Date Value Ref Range Status  . Hemoglobin A1C 03/29/2015 7.4   Final  Hospital Outpatient Visit on 03/28/2015  Component Date Value Ref Range Status  . Sodium 03/28/2015 135  135 - 145 mmol/L Final  . Potassium 03/28/2015 3.3* 3.5 - 5.1  mmol/L Final  . Chloride 03/28/2015 84* 101 - 111 mmol/L Final  . CO2 03/28/2015 38* 22 - 32 mmol/L Final  . Glucose, Bld 03/28/2015 333* 65 - 99 mg/dL Final  . BUN 03/28/2015 75* 6 - 20 mg/dL Final  . Creatinine, Ser 03/28/2015 1.84* 0.61 - 1.24 mg/dL Final  . Calcium 03/28/2015 9.2  8.9 - 10.3 mg/dL Final  . Total Protein 03/28/2015 7.2  6.5 - 8.1 g/dL Final  . Albumin 03/28/2015 3.2* 3.5 - 5.0 g/dL Final  . AST 03/28/2015 28  15 - 41 U/L Final  . ALT 03/28/2015 24  17 - 63 U/L Final  . Alkaline Phosphatase 03/28/2015 84  38 - 126 U/L Final  . Total Bilirubin 03/28/2015 0.5  0.3 - 1.2 mg/dL Final  . GFR calc non Af Amer 03/28/2015 36* >60 mL/min Final  . GFR calc Af Amer 03/28/2015 42* >60 mL/min Final   Comment: (NOTE) The eGFR has been calculated using the CKD EPI equation. This calculation has not been validated  in all clinical situations. eGFR's persistently <60 mL/min signify possible Chronic Kidney Disease.   Georgiann Hahn gap 03/28/2015 13  5 - 15 Final  Hospital Outpatient Visit on 03/07/2015  Component Date Value Ref Range Status  . Sodium 03/07/2015 134* 135 - 145 mmol/L Final  . Potassium 03/07/2015 4.1  3.5 - 5.1 mmol/L Final  . Chloride 03/07/2015 87* 101 - 111 mmol/L Final  . CO2 03/07/2015 37* 22 - 32 mmol/L Final  . Glucose, Bld 03/07/2015 357* 65 - 99 mg/dL Final  . BUN 03/07/2015 29* 6 - 20 mg/dL Final  . Creatinine, Ser 03/07/2015 1.32* 0.61 - 1.24 mg/dL Final  . Calcium 03/07/2015 9.0  8.9 - 10.3 mg/dL Final  . GFR calc non Af Amer 03/07/2015 54* >60 mL/min Final  . GFR calc Af Amer 03/07/2015 >60  >60 mL/min Final   Comment: (NOTE) The eGFR has been calculated using the CKD EPI equation. This calculation has not been validated in all clinical situations. eGFR's persistently <60 mL/min signify possible Chronic Kidney Disease.   . Anion gap 03/07/2015 10  5 - 15 Final  . WBC 03/07/2015 7.8  4.0 - 10.5 K/uL Final  . RBC 03/07/2015 3.25* 4.22 - 5.81 MIL/uL Final   . Hemoglobin 03/07/2015 8.3* 13.0 - 17.0 g/dL Final  . HCT 03/07/2015 28.6* 39.0 - 52.0 % Final  . MCV 03/07/2015 88.0  78.0 - 100.0 fL Final  . MCH 03/07/2015 25.5* 26.0 - 34.0 pg Final  . MCHC 03/07/2015 29.0* 30.0 - 36.0 g/dL Final  . RDW 03/07/2015 15.7* 11.5 - 15.5 % Final  . Platelets 03/07/2015 177  150 - 400 K/uL Final  . B Natriuretic Peptide 03/07/2015 222.5* 0.0 - 100.0 pg/mL Final    Dg Chest 2 View  05/02/2015   CLINICAL DATA:  Edema.  Weight gain.  Fluid retention for 1 week.  EXAM: CHEST  2 VIEW  COMPARISON:  01/11/2015.  FINDINGS: Interstitial pulmonary edema is present with Kerley B lines. The cardiopericardial silhouette is mildly enlarged for projection. Mediastinal contours are within normal limits. No pleural effusion. Respiratory motion is present on the lateral projection.  IMPRESSION: Interstitial pulmonary edema compatible with mild CHF.   Electronically Signed   By: Dereck Ligas M.D.   On: 05/02/2015 19:36   US Renal  05/04/2015   CLINICAL DATA:  Acute renal injury  EXAM: RENAL / URINARY TRACT ULTRASOUND COMPLETE  COMPARISON:  None.  FINDINGS: Right Kidney:  Length: 11.5 cm. Echogenicity and renal cortical thickness within normal limits. No mass, perinephric fluid, or hydronephrosis visualized. No sonographically demonstrable calculus or ureterectasis.  Left Kidney:  Length: 8.6 cm. Echogenicity within normal limits. There is renal cortical thinning. No mass, perinephric fluid, or hydronephrosis visualized. No sonographically demonstrable calculus or ureterectasis.  Bladder:  Appears normal for degree of bladder distention.  IMPRESSION: Small left kidney with renal cortical thinning compared to the normal-appearing right kidney. This size discrepancy raises question of renal artery stenosis on the left. In this regard, question whether patient is hypertensive. Study otherwise unremarkable.   Electronically Signed   By: Lowella Grip III M.D.   On: 05/04/2015 01:44      Assessment/Plan     ICD-9-CM ICD-10-CM   1. Chronic diastolic congestive heart failure s/p acute exacerbation 428.32 I50.32    428.0    2. Physical deconditioning - due to #1 799.3 R53.81   3. Paroxysmal atrial fibrillation - rate controlled 427.31 I48.0   4. OSA (obstructive sleep apnea) -  needs CPAP 327.23 G47.33   5. Type 2 diabetes mellitus with diabetic nephropathy - borderline controlled 250.40 E11.21    583.81    6. CKD (chronic kidney disease), stage III - stable 585.3 N18.3   7. Hypothyroidism, unspecified hypothyroidism type - suboptimally controlled 244.9 E03.9   8. Hyperlipidemia - stable 272.4 E78.5   9. Bilateral edema of lower extremity - stable 782.3 R60.0   10. Pulmonary hypertension on ATC [place] O2 416.8 I27.0   11. 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 If still elevated, will adjust thyroid med   --t/c fluid restriction if no response to diuresis  --GOAL: short term rehab and d/c home when medically appropriate. Communicated with pt and nursing.  --will follow  Desarai Barrack S. Perlie Gold  [facility] and Adult Medicine [address] [place], [place] [address] [phone] Cell (Monday-Friday 8 AM - 5 PM) [phone] After 5 PM and follow prompts

## 2015-05-25 ENCOUNTER — Ambulatory Visit (HOSPITAL_COMMUNITY)
Admission: RE | Admit: 2015-05-25 | Discharge: 2015-05-25 | Disposition: A | Discharge status: Home / Self Care | Payer: Medicare Other | Source: Ambulatory Visit | Attending: Internal Medicine | Admitting: Internal Medicine

## 2015-05-25 VITALS — BP 138/60 | HR 66 | Wt 323.8 lb

## 2015-05-25 DIAGNOSIS — E1165 Type 2 diabetes mellitus with hyperglycemia: Secondary | ICD-10-CM | POA: Diagnosis not present

## 2015-05-25 DIAGNOSIS — Z955 Presence of coronary angioplasty implant and graft: Secondary | ICD-10-CM | POA: Diagnosis not present

## 2015-05-25 DIAGNOSIS — I5033 Acute on chronic diastolic (congestive) heart failure: Secondary | ICD-10-CM | POA: Diagnosis not present

## 2015-05-25 DIAGNOSIS — G4733 Obstructive sleep apnea (adult) (pediatric): Secondary | ICD-10-CM | POA: Insufficient documentation

## 2015-05-25 DIAGNOSIS — N189 Chronic kidney disease, unspecified: Secondary | ICD-10-CM | POA: Diagnosis not present

## 2015-05-25 DIAGNOSIS — Z8249 Family history of ischemic heart disease and other diseases of the circulatory system: Secondary | ICD-10-CM | POA: Insufficient documentation

## 2015-05-25 DIAGNOSIS — I5032 Chronic diastolic (congestive) heart failure: Secondary | ICD-10-CM | POA: Insufficient documentation

## 2015-05-25 DIAGNOSIS — Z794 Long term (current) use of insulin: Secondary | ICD-10-CM | POA: Diagnosis not present

## 2015-05-25 DIAGNOSIS — I129 Hypertensive chronic kidney disease with stage 1 through stage 4 chronic kidney disease, or unspecified chronic kidney disease: Secondary | ICD-10-CM | POA: Insufficient documentation

## 2015-05-25 DIAGNOSIS — I272 Other secondary pulmonary hypertension: Secondary | ICD-10-CM | POA: Diagnosis not present

## 2015-05-25 DIAGNOSIS — Z7902 Long term (current) use of antithrombotics/antiplatelets: Secondary | ICD-10-CM | POA: Insufficient documentation

## 2015-05-25 DIAGNOSIS — Z66 Do not resuscitate: Secondary | ICD-10-CM | POA: Diagnosis not present

## 2015-05-25 DIAGNOSIS — I4891 Unspecified atrial fibrillation: Secondary | ICD-10-CM

## 2015-05-25 DIAGNOSIS — Z9861 Coronary angioplasty status: Secondary | ICD-10-CM

## 2015-05-25 DIAGNOSIS — N183 Chronic kidney disease, stage 3 unspecified: Secondary | ICD-10-CM

## 2015-05-25 DIAGNOSIS — I2510 Atherosclerotic heart disease of native coronary artery without angina pectoris: Secondary | ICD-10-CM | POA: Diagnosis not present

## 2015-05-25 DIAGNOSIS — E1122 Type 2 diabetes mellitus with diabetic chronic kidney disease: Secondary | ICD-10-CM | POA: Diagnosis not present

## 2015-05-25 DIAGNOSIS — Z79899 Other long term (current) drug therapy: Secondary | ICD-10-CM | POA: Diagnosis not present

## 2015-05-25 DIAGNOSIS — I481 Persistent atrial fibrillation: Secondary | ICD-10-CM | POA: Diagnosis not present

## 2015-05-25 DIAGNOSIS — Z9981 Dependence on supplemental oxygen: Secondary | ICD-10-CM | POA: Insufficient documentation

## 2015-05-25 LAB — BASIC METABOLIC PANEL
Anion gap: 9 (ref 5–15)
BUN: 61 mg/dL — AB (ref 6–20)
CHLORIDE: 85 mmol/L — AB (ref 101–111)
CO2: 42 mmol/L — AB (ref 22–32)
CREATININE: 1.85 mg/dL — AB (ref 0.61–1.24)
Calcium: 9.6 mg/dL (ref 8.9–10.3)
GFR calc non Af Amer: 36 mL/min — ABNORMAL LOW (ref >60)
GFR, EST AFRICAN AMERICAN: 41 mL/min — AB (ref >60)
GLUCOSE: 157 mg/dL — AB (ref 65–99)
Potassium: 3.5 mmol/L (ref 3.5–5.1)
Sodium: 136 mmol/L (ref 135–145)

## 2015-05-25 LAB — BRAIN NATRIURETIC PEPTIDE: B Natriuretic Peptide: 214.5 pg/mL — ABNORMAL HIGH (ref 0.0–100.0)

## 2015-05-26 DIAGNOSIS — I5032 Chronic diastolic (congestive) heart failure: Secondary | ICD-10-CM | POA: Insufficient documentation

## 2015-05-26 NOTE — Progress Notes (Signed)
Patient ID: Mark Roberson, male   DOB: 01-01-1946, 69 y.o.   MRN: [id number]  PCP: Dr. 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  He was seen by a cardiologist up in [place], but moved earlier this year to [facility].  He had PCI back in the 1990s, no cath since the '90s.  He had a successful TEE-guided DCCV for atrial fibrillation in [place] in 2/16.  Later in 2/16, he moved to [facility].  He was admitted to [facility] with acute on chronic diastolic CHF.  He was back in atrial fibrillation.  He was admitted again in 5/16 with dyspnea, weakness, and edema.  He was diuresed. He had epistaxis and Xarelto was briefly held.  He had tooth abscesses treated.  DCCV in 7/16 led to brief NSR but he reverted to atrial fibrillation.   He was admitted in 8/16 with acute on chronic diastolic CHF and diuresed well with IV Lasix.  HR was low, Coreg was stopped.  At appointment last week, he was hypotensive and felt poorly.  Creatinine was up.  He was thought to be overdiuresed. Diuretics were cut back.  He has actually gone back into a nursing home since then.  He is feeling better, BP is better.  He can walking on flat ground without dyspnea for short distances.  No chest pain.  No lightheadedness.  Weight is actually down significantly today.   Labs (2/16): LDL 64 Labs (5/16): K 4.5, creatinine 1.81, Hgb 8.8 Labs (6/16): K 4.1 Creatinine 1.32 , HCT 28.6, BNP 223 Labs (7/16): K 3.3, Creatinine 1.84, HCT 26 Labs (8/16): K 3.8, creatinine 1.64 => 2.18, HCT 28.9, TSH 12  PMH: 1. CKD: Suspect diabetic nephropathy plays a role.  2. Diabetes 3. OSA: Severe on 5/16 sleep study.  Not yet on CPAP.  4. Chronic diastolic CHF: Echo (2/16) with EF 60-65%, mild MR, moderately dilated RV with normal systolic function, PA systolic pressure 50 mmHg.  5. Atrial fibrillation: Now persistent.  He had successful TEE-guided DCCV in 2/16  in [place] but atrial fibrillation recurred not long after. DCCV 7/16 on amiodarone => atrial fibrillation quickly recurred.  6. 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  He is on home oxygen.  8. CAD: s/p PCI with DES in the 1990s.  No cardiac cath since 1990s.  9. Anemia 10. 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 12. Bradycardia  SH: Living with friends in [place], originally from [place] (moved to [place] in 2016).  Nonsmoker.  No ETOH.   FH: CAD  ROS: All systems reviewed and negative except as per HPI.   Current Outpatient Prescriptions  Medication Sig Dispense Refill  . acetaminophen (TYLENOL) 500 MG tablet Take 500 mg by mouth every 4 (four) hours as needed (pain). Do not exceed 4 gms of tylenol in 24 hours    . albuterol (PROVENTIL) (2.5 MG/3ML) 0.083% nebulizer solution Take 3 mLs (2.5 mg total) by nebulization every 6 (six) hours as needed for wheezing or shortness of breath. 75 mL 1  . atorvastatin (LIPITOR) 20 MG tablet Take 20 mg by mouth at bedtime.     . Cholecalciferol (VITAMIN D) 2000 UNITS tablet Take 2,000 Units by mouth daily.    Marland Kitchen gabapentin (NEURONTIN) 300 MG capsule Take 300 mg by mouth 3 (three) times daily.    . insulin aspart (NOVOLOG FLEXPEN) 100 UNIT/ML FlexPen Inject 30 Units into the skin 3 (three) times  daily before meals. Check blood glucose before meals    . Insulin Glargine (LANTUS SOLOSTAR) 100 UNIT/ML Solostar Pen Inject 50 Units into the skin at bedtime.     Marland Kitchen levothyroxine (LEVOTHROID) 25 MCG tablet Take 1 tablet (25 mcg total) by mouth daily before breakfast. 90 tablet prn  . Magnesium 250 MG TABS Take 500 mg by mouth daily. 2 by mouth daily    . metolazone (ZAROXOLYN) 2.5 MG tablet Take 1 tablet (2.5 mg total) by mouth 2 (two) times a week. Every Mon and Fri 15 tablet 3  . Multiple Vitamins-Minerals (DECUBI-VITE) CAPS Take 1 capsule by mouth daily.    [name] 3-6-9 CAPS Take 2 capsules by mouth daily.    . potassium chloride SA (K-DUR,KLOR-CON) 20 MEQ tablet Take 1 tablet (20 mEq  total) by mouth 2 (two) times a week. Every Mon and Fri with metolazone 30 tablet 3  . PROAIR HFA 108 (90 BASE) MCG/ACT inhaler INHALE 1 TO 2 PUFFS BY MOUTH FOUR TIMES DAILY AS DIRECTED AS NEEDED FOR WHEEZING AND SHORTNESS OF BREATH 8.5 g 0  . Rivaroxaban (XARELTO) 15 MG TABS tablet Take 1 tablet (15 mg total) by mouth daily with supper. (Patient taking differently: Take 15 mg by mouth daily with breakfast. 9am) 30 tablet   . torsemide (DEMADEX) 20 MG tablet Take 3 tabs in AM and 2 tabs in PM 180 tablet 3   No current facility-administered medications for this encounter.   BP 138/60 mmHg  Pulse 66  Wt 323 lb 12.8 oz (146.875 kg)  SpO2 93% General: NAD, obese. Arrived in wheelchair.  Neck: Thick, JVP 8 cm, no thyromegaly or thyroid nodule.  Lungs: Slight crackles at bases bilaterally.  CV: Nondisplaced PMI.  Irregular S1/S2, no S3/S4, 1/6 SEM RUSB.  1+ ankle edema.  No carotid bruit.  Normal pedal pulses.  Abdomen: Soft, nontender, no hepatosplenomegaly, no distention.  Skin: Intact without lesions or rashes.  Neurologic: Alert and oriented x 3.  Psych: Normal affect. Extremities: No clubbing or cyanosis.  HEENT: Normal.   Assessment/Plan: 1. Chronic diastolic CHF: EF 60-45% on last echo with RV enlargement.  PA pressure elevated by echo.  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  He was recently in the hospital for diuresis.  At last appointment, he was thought to be over-diuresed with hypotension and elevated creatinine.  I cut back on his diuretics.  He looks better today.  - Continue torsemide 60 qam/40 qpm - Continue metolazone twice a week.  - BMET/BNP today.  2. Pulmonary hypertension: Elevated PA pressure on last echo.  As above, suspect combination of group 2 (pulmonary venous hypertension) and group 3 (OHS/OSA) pulmonary  hypertension.  This will need to be treated by oxygen, CPAP, and diuresis.  3. Atrial fibrillation: This has been persistent, probably since 2/16.  He has remained in atrial fibrillation despite DCCV attempt 7/16 on amiodarone.  Amiodarone has been stopped.  Coreg stopped as well due to bradycardia.  - Continue Xarelto 15 mg daily (GFR < 50).  4. OSA/suspected OHS: Continue oxygen continuously.  He will need to start on CPAP given severe OSA. Referred to pulmonary. Needs to reschedule appt with pulmonary.  5. CAD: Stable, no chest pain.  He is on Xarelto with stable CAD so not on aspirin.  Continue statin, good lipids earlier this year.  6. CKD:  Repeat creatinine today after AKI with over-diuresis.  7. Uncontrolled DM:  Endocrinology following per pt.  8. Elevated TSH: Need to repeat TSH with free T3 and free T4.   9. DNR   Followup in 3 wks.   Marca Ancona  05/26/2015

## 2015-06-01 ENCOUNTER — Telehealth (HOSPITAL_COMMUNITY): Payer: Self-pay | Admitting: *Deleted

## 2015-06-01 NOTE — Telephone Encounter (Signed)
Received the following message from Dr Shirlee Latch:   Message     Please have him get repeat TSH with free T3 and free T4 drawn.    Called [employer] and gave verbal orders to Craig Beach, Charity fundraiser along w/our fax number for them to fax [place] results

## 2015-06-12 ENCOUNTER — Non-Acute Institutional Stay (SKILLED_NURSING_FACILITY): Payer: Medicare Other | Admitting: Adult Health

## 2015-06-12 DIAGNOSIS — I5033 Acute on chronic diastolic (congestive) heart failure: Secondary | ICD-10-CM | POA: Diagnosis not present

## 2015-06-12 DIAGNOSIS — I4891 Unspecified atrial fibrillation: Secondary | ICD-10-CM | POA: Diagnosis not present

## 2015-06-12 DIAGNOSIS — N183 Chronic kidney disease, stage 3 unspecified: Secondary | ICD-10-CM

## 2015-06-12 DIAGNOSIS — E1121 Type 2 diabetes mellitus with diabetic nephropathy: Secondary | ICD-10-CM | POA: Diagnosis not present

## 2015-06-16 ENCOUNTER — Encounter (HOSPITAL_COMMUNITY): Payer: Self-pay

## 2015-06-26 ENCOUNTER — Ambulatory Visit (HOSPITAL_COMMUNITY)
Admission: RE | Admit: 2015-06-26 | Discharge: 2015-06-26 | Disposition: A | Discharge status: Home / Self Care | Payer: Medicare Other | Source: Ambulatory Visit | Attending: Cardiology | Admitting: Cardiology

## 2015-06-26 ENCOUNTER — Other Ambulatory Visit (HOSPITAL_COMMUNITY): Payer: Self-pay | Admitting: *Deleted

## 2015-06-26 ENCOUNTER — Encounter (HOSPITAL_COMMUNITY): Payer: Self-pay

## 2015-06-26 VITALS — BP 96/68 | HR 67 | Wt 311.8 lb

## 2015-06-26 DIAGNOSIS — Z66 Do not resuscitate: Secondary | ICD-10-CM | POA: Diagnosis not present

## 2015-06-26 DIAGNOSIS — E1165 Type 2 diabetes mellitus with hyperglycemia: Secondary | ICD-10-CM | POA: Insufficient documentation

## 2015-06-26 DIAGNOSIS — I481 Persistent atrial fibrillation: Secondary | ICD-10-CM | POA: Insufficient documentation

## 2015-06-26 DIAGNOSIS — Z7902 Long term (current) use of antithrombotics/antiplatelets: Secondary | ICD-10-CM | POA: Insufficient documentation

## 2015-06-26 DIAGNOSIS — Z9981 Dependence on supplemental oxygen: Secondary | ICD-10-CM | POA: Diagnosis not present

## 2015-06-26 DIAGNOSIS — G4733 Obstructive sleep apnea (adult) (pediatric): Secondary | ICD-10-CM | POA: Insufficient documentation

## 2015-06-26 DIAGNOSIS — I2510 Atherosclerotic heart disease of native coronary artery without angina pectoris: Secondary | ICD-10-CM | POA: Insufficient documentation

## 2015-06-26 DIAGNOSIS — E1122 Type 2 diabetes mellitus with diabetic chronic kidney disease: Secondary | ICD-10-CM | POA: Diagnosis not present

## 2015-06-26 DIAGNOSIS — I129 Hypertensive chronic kidney disease with stage 1 through stage 4 chronic kidney disease, or unspecified chronic kidney disease: Secondary | ICD-10-CM | POA: Diagnosis not present

## 2015-06-26 DIAGNOSIS — I5032 Chronic diastolic (congestive) heart failure: Secondary | ICD-10-CM | POA: Insufficient documentation

## 2015-06-26 DIAGNOSIS — N189 Chronic kidney disease, unspecified: Secondary | ICD-10-CM | POA: Diagnosis not present

## 2015-06-26 DIAGNOSIS — Z794 Long term (current) use of insulin: Secondary | ICD-10-CM | POA: Diagnosis not present

## 2015-06-26 DIAGNOSIS — I4891 Unspecified atrial fibrillation: Secondary | ICD-10-CM

## 2015-06-26 DIAGNOSIS — I272 Other secondary pulmonary hypertension: Secondary | ICD-10-CM | POA: Diagnosis not present

## 2015-06-26 DIAGNOSIS — Z8249 Family history of ischemic heart disease and other diseases of the circulatory system: Secondary | ICD-10-CM | POA: Insufficient documentation

## 2015-06-26 DIAGNOSIS — Z955 Presence of coronary angioplasty implant and graft: Secondary | ICD-10-CM | POA: Diagnosis not present

## 2015-06-26 DIAGNOSIS — Z79899 Other long term (current) drug therapy: Secondary | ICD-10-CM | POA: Diagnosis not present

## 2015-06-26 LAB — BASIC METABOLIC PANEL
Anion gap: 14 (ref 5–15)
BUN: 75 mg/dL — AB (ref 6–20)
CO2: 35 mmol/L — ABNORMAL HIGH (ref 22–32)
CREATININE: 2.27 mg/dL — AB (ref 0.61–1.24)
Calcium: 9.4 mg/dL (ref 8.9–10.3)
Chloride: 85 mmol/L — ABNORMAL LOW (ref 101–111)
GFR calc Af Amer: 32 mL/min — ABNORMAL LOW (ref >60)
GFR, EST NON AFRICAN AMERICAN: 28 mL/min — AB (ref >60)
GLUCOSE: 215 mg/dL — AB (ref 65–99)
Potassium: 3.3 mmol/L — ABNORMAL LOW (ref 3.5–5.1)
SODIUM: 134 mmol/L — AB (ref 135–145)

## 2015-06-26 LAB — TSH: TSH: 17.194 u[IU]/mL — AB (ref 0.350–4.500)

## 2015-06-26 LAB — T4, FREE: Free T4: 0.85 ng/dL (ref 0.61–1.12)

## 2015-06-26 MED ORDER — RIVAROXABAN 15 MG PO TABS: 15.0000 mg | ORAL_TABLET | Freq: Every day | ORAL | Status: DC

## 2015-06-26 NOTE — Progress Notes (Addendum)
Advanced Heart Failure Medication Review by a Pharmacist  Does the patient  feel that his/her medications are working for him/her?  yes  Has the patient been experiencing any side effects to the medications prescribed?  no  Does the patient measure his/her own blood pressure or blood glucose at home?  no   Does the patient have any problems obtaining medications due to transportation or finances?   no  Understanding of regimen: fair Understanding of indications: fair Potential of compliance: good    Pharmacist comments:  Mr. Mark Roberson is a 69 yo M presenting with his wife and without a medication list. His wife seems to have a better understanding of his regimen and reports good compliance with his medications. She did state that she has had some issues getting coverage for his Xarelto so I will call his pharmacy to inquire about this. They did not have any other medication-related questions or concerns for me today.   Mark Roberson. Mark Roberson, PharmD, BCPS, CPP Clinical Pharmacist Pager: [phone] Phone: [phone] 06/26/2015 9:08 AM   ADDENDUM:  Mark Roberson pharmacy is stating that Xarelto requires a PA. I have initiated the process with [employer] and should hear back in 5-7 days.   Mark Roberson. Mark Roberson, PharmD, BCPS, CPP Clinical Pharmacist Pager: [phone] Phone: [phone] 06/26/2015 12:03 PM

## 2015-06-26 NOTE — Patient Instructions (Signed)
Labs today  Please follow up with Dr Everardo All regarding your diabetes  Your physician recommends that you schedule a follow-up appointment in: 2 months

## 2015-06-26 NOTE — Progress Notes (Signed)
Patient ID: Mark Roberson, male   DOB: 04-21-1946, 69 y.o.   MRN: [id number]  PCP: Dr. 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  He was seen by a cardiologist up in [place], but moved earlier this year to [facility].  He had PCI back in the 1990s, no cath since the '90s.  He had a successful TEE-guided DCCV for atrial fibrillation in [place] in 2/16.  Later in 2/16, he moved to [facility].  He was admitted to [facility] with acute on chronic diastolic CHF.  He was back in atrial fibrillation.  He was admitted again in 5/16 with dyspnea, weakness, and edema.  He was diuresed. He had epistaxis and Xarelto was briefly held.  He had tooth abscesses treated.  DCCV in 7/16 led to brief NSR but he reverted to atrial fibrillation.   He was admitted in 8/16 with acute on chronic diastolic CHF and diuresed well with IV Lasix.  HR was low, Coreg was stopped. After this, he ended up being over-diuresed and diuretics were cut back.  He went back into a SNF for a short period but has been doing well and is now living with friends. Weight is down 12 lbs.  He is mildly lightheaded with bending over and standing up, otherwise no further lightheadedness.  No dyspnea walking on flat ground.  He is getting PT at home.  No chest pain .  Labs (2/16): LDL 64 Labs (5/16): K 4.5, creatinine 1.81, Hgb 8.8 Labs (6/16): K 4.1 Creatinine 1.32 , HCT 28.6, BNP 223 Labs (7/16): K 3.3, Creatinine 1.84, HCT 26 Labs (8/16): K 3.8, creatinine 1.64 => 2.18, HCT 28.9, TSH 12 Labs (9/16): K 3.5, creatinine 1.85, BNP 214  PMH: 1. CKD: Suspect diabetic nephropathy plays a role.  2. Diabetes 3. OSA: Severe on 5/16 sleep study.  Not yet on CPAP.  4. Chronic diastolic CHF: Echo (2/16) with EF 60-65%, mild MR, moderately dilated RV with normal systolic function, PA systolic pressure 50 mmHg.  5. Atrial fibrillation: Now persistent.  He had  successful TEE-guided DCCV in 2/16 in [place] but atrial fibrillation recurred not long after. DCCV 7/16 on amiodarone => atrial fibrillation quickly recurred.  6. 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  He is on home oxygen.  8. CAD: s/p PCI with DES in the 1990s.  No cardiac cath since 1990s.  9. Anemia 10. 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 12. Bradycardia  SH: Living with friends in [place], originally from [place] (moved to [place] in 2016).  Nonsmoker.  No ETOH.   FH: CAD  ROS: All systems reviewed and negative except as per HPI.   Current Outpatient Prescriptions  Medication Sig Dispense Refill  . acetaminophen (TYLENOL) 500 MG tablet Take 500 mg by mouth every 4 (four) hours as needed (pain). Do not exceed 4 gms of tylenol in 24 hours    . albuterol (PROVENTIL) (2.5 MG/3ML) 0.083% nebulizer solution Take 3 mLs (2.5 mg total) by nebulization every 6 (six) hours as needed for wheezing or shortness of breath. 75 mL 1  . atorvastatin (LIPITOR) 20 MG tablet Take 20 mg by mouth at bedtime.     . Cholecalciferol (VITAMIN D) 2000 UNITS tablet Take 2,000 Units by mouth daily.    Marland Kitchen gabapentin (NEURONTIN) 300 MG capsule Take 300 mg by mouth 3 (three) times daily.    . insulin aspart (NOVOLOG FLEXPEN) 100 UNIT/ML FlexPen Inject  30 Units into the skin 3 (three) times daily before meals. Check blood glucose before meals    . Insulin Glargine (LANTUS SOLOSTAR) 100 UNIT/ML Solostar Pen Inject 50 Units into the skin at bedtime.     Marland Kitchen levothyroxine (LEVOTHROID) 25 MCG tablet Take 1 tablet (25 mcg total) by mouth daily before breakfast. 90 tablet prn  . Magnesium 250 MG TABS Take 500 mg by mouth 2 (two) times daily.     . metolazone (ZAROXOLYN) 2.5 MG tablet Take 1 tablet (2.5 mg total) by mouth 2 (two) times a week. Every Mon and Fri 15 tablet 3  . Omega 3-6-9 CAPS Take 2 capsules by mouth daily.    . potassium chloride SA (K-DUR,KLOR-CON) 20 MEQ tablet Take 1 tablet (20 mEq total) by mouth 2 (two) times a week. Every Mon and  Fri with metolazone 30 tablet 3  . PROAIR HFA 108 (90 BASE) MCG/ACT inhaler INHALE 1 TO 2 PUFFS BY MOUTH FOUR TIMES DAILY AS DIRECTED AS NEEDED FOR WHEEZING AND SHORTNESS OF BREATH 8.5 g 0  . torsemide (DEMADEX) 20 MG tablet Take 3 tabs in AM and 2 tabs in PM 180 tablet 3  . Rivaroxaban (XARELTO) 15 MG TABS tablet Take 1 tablet (15 mg total) by mouth daily with breakfast. 30 tablet 3   No current facility-administered medications for this encounter.   BP 96/68 mmHg  Pulse 67  Wt 311 lb 12.8 oz (141.432 kg)  SpO2 99% General: NAD, obese. Arrived in wheelchair.  Neck: Thick, no JVD, no thyromegaly or thyroid nodule.  Lungs: Slight crackles at bases bilaterally.  CV: Nondisplaced PMI.  Irregular S1/S2, no S3/S4, 1/6 SEM RUSB.  No edema.  No carotid bruit.  Normal pedal pulses.  Abdomen: Soft, nontender, no hepatosplenomegaly, no distention.  Skin: Intact without lesions or rashes.  Neurologic: Alert and oriented x 3.  Psych: Normal affect. Extremities: No clubbing or cyanosis.  HEENT: Normal.   Assessment/Plan: 1. Chronic diastolic CHF: EF 40-98% on last echo with RV enlargement.  PA pressure elevated by echo.  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  He does not look volume overloaded on exam and weight is lower. - Continue torsemide 60 qam/40 qpm - Continue metolazone twice a week.  - BMET today.  2. Pulmonary hypertension: Elevated PA pressure on last echo.  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  This will need to be treated by oxygen, CPAP, and diuresis.  3. Atrial fibrillation: This has been persistent, probably since 2/16.  He has remained in atrial fibrillation despite DCCV attempt 7/16 on amiodarone.  Amiodarone has been stopped.  Coreg stopped as  well due to bradycardia.  HR now in 60s.  - Continue Xarelto 15 mg daily (GFR < 50).  4. OSA/suspected OHS: Continue oxygen continuously.  He will need to start on CPAP given severe OSA. Need to get him a pulmonary appointment.  5. CAD: Stable, no chest pain.  He is on Xarelto with stable CAD so not on aspirin.  Continue statin, good lipids earlier this year.  6. CKD: Repeat creatinine today to follow.  7. Uncontrolled DM:  Endocrinology following per pt. He says blood glucose is higher, will need to followup with endocrinology.  8. Elevated TSH: Need to repeat TSH with free T3 and free T4 today.  9. DNR   Followup in 2 months.    Marca Ancona  06/26/2015

## 2015-06-27 ENCOUNTER — Telehealth (HOSPITAL_COMMUNITY): Payer: Self-pay | Admitting: Pharmacist

## 2015-06-27 ENCOUNTER — Ambulatory Visit: Payer: Self-pay | Admitting: Internal Medicine

## 2015-06-27 LAB — T3, FREE: T3 FREE: 2.6 pg/mL (ref 2.0–4.4)

## 2015-06-27 NOTE — Telephone Encounter (Signed)
Xarelto 15 mg was approved through [employer] Part D coverage and [facility] pharmacy was notified. Pharmacist at [facility] stated that copay was $161.62/month as patient is currently in the coverage gap ("donut hole"). May be able to apply for assistance through [employer] with income verification. Called patient and left VM to call back so that we can explain the options.   Tyler Deis. Bonnye Fava, PharmD, BCPS, CPP Clinical Pharmacist Pager: [phone] Phone: [phone] 06/27/2015 9:30 AM

## 2015-06-28 ENCOUNTER — Telehealth (HOSPITAL_COMMUNITY): Payer: Self-pay | Admitting: *Deleted

## 2015-06-28 ENCOUNTER — Telehealth: Payer: Self-pay | Admitting: Internal Medicine

## 2015-06-28 DIAGNOSIS — G4733 Obstructive sleep apnea (adult) (pediatric): Secondary | ICD-10-CM

## 2015-06-28 NOTE — Telephone Encounter (Signed)
Spoke with Dewayne Hatch, the Child psychotherapist and gave verbal orders.

## 2015-06-28 NOTE — Telephone Encounter (Signed)
   Message     Please get Mark Roberson set up with pulmonary to get CPAP. Has severe OSA.    Received above message from Dr Shirlee Latch, order for referral has been placed, Ines Bloomer will call pt to schedule

## 2015-06-28 NOTE — Telephone Encounter (Signed)
Requesting verbal orders so can visit patient and another visit for next month.  Caregivers going out of town at the end of month and needs to plan out care treatment.  This may include going to a care facility while they are away.

## 2015-06-30 ENCOUNTER — Encounter: Payer: Self-pay | Admitting: Endocrinology

## 2015-06-30 ENCOUNTER — Ambulatory Visit (INDEPENDENT_AMBULATORY_CARE_PROVIDER_SITE_OTHER): Payer: Medicare Other | Admitting: Endocrinology

## 2015-06-30 VITALS — BP 134/87 | HR 73 | Temp 98.4°F | Ht 72.0 in | Wt 308.0 lb

## 2015-06-30 DIAGNOSIS — I2510 Atherosclerotic heart disease of native coronary artery without angina pectoris: Secondary | ICD-10-CM | POA: Diagnosis not present

## 2015-06-30 DIAGNOSIS — Z9861 Coronary angioplasty status: Secondary | ICD-10-CM

## 2015-06-30 DIAGNOSIS — E1121 Type 2 diabetes mellitus with diabetic nephropathy: Secondary | ICD-10-CM

## 2015-06-30 LAB — POCT GLYCOSYLATED HEMOGLOBIN (HGB A1C): HEMOGLOBIN A1C: 8

## 2015-06-30 LAB — GLUCOSE, POCT (MANUAL RESULT ENTRY): POC GLUCOSE: 252 mg/dL — AB (ref 70–99)

## 2015-06-30 NOTE — Patient Instructions (Addendum)
check your blood sugar 4 times a day: before the 3 meals, and at bedtime.  also check if you have symptoms of your blood sugar being too high or too low.  please keep a record of the readings and bring it to your next appointment here.  You can write it on any piece of paper.  please call us sooner if your blood sugar goes below 70, or if you have a lot of readings over 200.  Please reduce lantus to 40 units at bedtime, and: Take novolog 3 times a day (just before each meal), 40-30-40 units Please take these insulin doses, no matter what cbg is. If you miss a meal, skip the novolog.  Please come back for a follow-up appointment in 3-4 months.

## 2015-06-30 NOTE — Progress Notes (Signed)
Subjective:    Patient ID: Mark Roberson, male    DOB: October 08, 1945, 69 y.o.   MRN: [id number]  HPI Pt returns for f/u of diabetes mellitus: DM type: Insulin-requiring type 2 Dx'ed: 1996 Complications: severe polyneuropathy and renal insufficiency Therapy: insulin since 1998 DKA: never Severe hypoglycemia: never Pancreatitis: never Other: exercise is severely limited by health problems; he takes multiple daily injections Interval history: i asked caregiver Adolph Pollack), who says pt has mild hypoglycemia approx twice a month.  It usually happens in the middle of the night.  no cbg record.  He says he does not check cbg at hs.   Past Medical History  Diagnosis Date  . CHF (congestive heart failure) (HCC)   . Atrial fibrillation (HCC)   . Diabetes mellitus without complication (HCC)   . Hypertension   . Sleep apnea   . Asthma   . CKD (chronic kidney disease), stage III   . Anemia     Past Surgical History  Procedure Laterality Date  . 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 Wound debridement Right   . Multiple extractions with alveoloplasty N/A 01/17/2015    Procedure: Extraction of tooth #'s 14,15, 16 with alveolopalsty;  Surgeon: Charlynne Pander, DDS;  Location: [facility] OR;  Service: Oral Surgery;  Laterality: N/A;  . Cardioversion N/A 04/03/2015    Procedure: CARDIOVERSION;  Surgeon: Laurey Morale, MD;  Location[facility] ENDOSCOPY;  Service: Cardiovascular;  Laterality: N/A;    Social History   Social History  . Marital Status: Single    Spouse Name: N/A  . Number of Children: N/A  . Years of Education: N/A   Occupational History  . Not on file.   Social History Main Topics  . Smoking status: Never Smoker   . Smokeless tobacco: Never Used  . Alcohol Use: Yes     Comment: rare  . Drug Use: No  . Sexual Activity: Not on file   Other Topics Concern  . Not on file   Social History Narrative    Current Outpatient Prescriptions on File Prior to Visit  Medication Sig  Dispense Refill  . acetaminophen (TYLENOL) 500 MG tablet Take 500 mg by mouth every 4 (four) hours as needed (pain). Do not exceed 4 gms of tylenol in 24 hours    . albuterol (PROVENTIL) (2.5 MG/3ML) 0.083% nebulizer solution Take 3 mLs (2.5 mg total) by nebulization every 6 (six) hours as needed for wheezing or shortness of breath. 75 mL 1  . atorvastatin (LIPITOR) 20 MG tablet Take 20 mg by mouth at bedtime.     . Cholecalciferol (VITAMIN D) 2000 UNITS tablet Take 2,000 Units by mouth daily.    Marland Kitchen gabapentin (NEURONTIN) 300 MG capsule Take 300 mg by mouth 3 (three) times daily.    . insulin aspart (NOVOLOG FLEXPEN) 100 UNIT/ML FlexPen 3 times a day (just before each meal) 40-30-40 units    . Insulin Glargine (LANTUS SOLOSTAR) 100 UNIT/ML Solostar Pen Inject 40 Units into the skin at bedtime.     Marland Kitchen levothyroxine (LEVOTHROID) 25 MCG tablet Take 1 tablet (25 mcg total) by mouth daily before breakfast. 90 tablet prn  . Magnesium 250 MG TABS Take 500 mg by mouth 2 (two) times daily.     . metolazone (ZAROXOLYN) 2.5 MG tablet Take 1 tablet (2.5 mg total) by mouth 2 (two) times a week. Every Mon and Fri 15 tablet 3  . Omega 3-6-9 CAPS Take 2 capsules by  mouth daily.    . potassium chloride SA (K-DUR,KLOR-CON) 20 MEQ tablet Take 1 tablet (20 mEq total) by mouth 2 (two) times a week. Every Mon and Fri with metolazone 30 tablet 3  . PROAIR HFA 108 (90 BASE) MCG/ACT inhaler INHALE 1 TO 2 PUFFS BY MOUTH FOUR TIMES DAILY AS DIRECTED AS NEEDED FOR WHEEZING AND SHORTNESS OF BREATH 8.5 g 0  . Rivaroxaban (XARELTO) 15 MG TABS tablet Take 1 tablet (15 mg total) by mouth daily with breakfast. 30 tablet 3  . torsemide (DEMADEX) 20 MG tablet Take 3 tabs in AM and 2 tabs in PM (Patient taking differently: Take 2 tabs in AM and 2 tabs in PM) 180 tablet 3   No current facility-administered medications on file prior to visit.    Allergies  Allergen Reactions  . Percocet [Oxycodone-Acetaminophen] Other (See Comments)      hallucination  . Penicillins Hives    Family History  Problem Relation Age of Onset  . COPD Mother   . Heart disease Father   . Cancer Sister     breast  . Heart disease Brother   . Diabetes Neg Hx     BP 134/87 mmHg  Pulse 73  Temp(Src) 98.4 F (36.9 C) (Oral)  Ht 6' (1.829 m)  Wt 308 lb (139.708 kg)  BMI 41.76 kg/m2  SpO2 97%    Review of Systems Denies LOC.    Objective:   Physical Exam VITAL SIGNS:  See vs page GENERAL: no distress.  In wheelchair.  Has 02 on.  Pulses: dorsalis pedis intact bilat.   MSK: no deformity of the feet CV: trace bilat leg edema.   Skin:  no ulcer on the feet.  normal color and temp on the feet. Neuro: sensation is intact to touch on the feet, but decreased from normal Ext: There is bilateral onychomycosis of the toenails   A1c=8.0    Assessment & Plan:  DM: The pattern of his cbg's indicates he needs some adjustment in his therapy.   Patient is advised the following: Patient Instructions  check your blood sugar 4 times a day: before the 3 meals, and at bedtime.  also check if you have symptoms of your blood sugar being too high or too low.  please keep a record of the readings and bring it to your next appointment here.  You can write it on any piece of paper.  please call us sooner if your blood sugar goes below 70, or if you have a lot of readings over 200.  Please reduce lantus to 40 units at bedtime, and: Take novolog 3 times a day (just before each meal), 40-30-40 units Please take these insulin doses, no matter what cbg is. If you miss a meal, skip the novolog.  Please come back for a follow-up appointment in 3-4 months.

## 2015-07-04 ENCOUNTER — Other Ambulatory Visit: Payer: Self-pay | Admitting: Adult Health

## 2015-07-07 ENCOUNTER — Telehealth: Payer: Self-pay | Admitting: Endocrinology

## 2015-07-07 ENCOUNTER — Telehealth (HOSPITAL_COMMUNITY): Payer: Self-pay | Admitting: *Deleted

## 2015-07-07 NOTE — Telephone Encounter (Signed)
Plaquemine SinkRita called in stating patients weight is up 8lbs in 7days.  He is currently taking metolazone mon & fri. She requests a call directly from heather.

## 2015-07-07 NOTE — Telephone Encounter (Signed)
Please advise below.

## 2015-07-07 NOTE — Telephone Encounter (Signed)
please call patient: please verify cbg's is high throughout the day. If so: Please continue the same lantus, and: increase novolog to 3 times a day (just before each meal), 45-35-45 units. If not, please let me know what time of day cbg is high

## 2015-07-07 NOTE — Telephone Encounter (Signed)
Since the change in the PM lantus dosing and the humalog dosing he has had multiple highs ranging from 290-above 500. Please advise asap.

## 2015-07-07 NOTE — Telephone Encounter (Signed)
Left pt a Vm to return call for updated information from Dr.Ellison

## 2015-07-12 ENCOUNTER — Telehealth (HOSPITAL_COMMUNITY): Payer: Self-pay | Admitting: *Deleted

## 2015-07-12 NOTE — Addendum Note (Signed)
Addended by: Noralee SpaceSCHUB, Marykathleen Russi M on: 07/12/2015 11:51 AM   Modules accepted: Orders

## 2015-07-12 NOTE — Telephone Encounter (Signed)
Pulmonary's first available was Oct 27 2015, per Dr Shirlee LatchMcLean refer to Dr Armanda Magicraci Turner, referral placed

## 2015-07-12 NOTE — Telephone Encounter (Signed)
Home Health nurse called to let us know that Mr Mark Roberson weight was up 11 pounds in 11 days.  On October 3rd pt had lab work and we cut his Metolazone back to once a week and Torsemide to 40/40  Pt never had labs drawn a week later. HH RN will get labs tomorrow and I told her that we would be in touch with her depending on labs.

## 2015-07-13 ENCOUNTER — Telehealth: Payer: Self-pay | Admitting: Internal Medicine

## 2015-07-13 NOTE — Telephone Encounter (Signed)
Please contact in regards to how she needs to reorder insulin.  Im not sure if this is the assistance program for the manufacturer.  Caregiver is not sure what it is as well.  States there were forms completed at our office in regards.

## 2015-07-14 ENCOUNTER — Encounter: Payer: Self-pay | Admitting: Cardiology

## 2015-07-17 ENCOUNTER — Telehealth: Payer: Self-pay

## 2015-07-17 NOTE — Telephone Encounter (Signed)
Message  Received: 3 days ago    Henrietta DineKathryn A Kemp, RN  Caylin Raby Jeral PinchA Price, CMA            Clint Young read this sleep study. Is this patient being referred to Dr. Mayford Knifeurner or is Dr. Maple HudsonYoung going to follow this?       Previous Messages     ----- Message -----   From: Joanette Gulaandice A Price, CMA   Sent: 07/14/2015  2:33 PM    To: Henrietta DineKathryn A Kemp, RN   Hey, Since this study was done back in May does that mean the patient needs to see Dr Mayford Knifeurner at the earliest appt avaliable? If so I didn't see an open appt until January. Forgive me if I missed something but I figured I had better ask you about this.   Thanks  ----- Message -----   From: Trish FountainKamilah A Stephens   Sent: 07/12/2015 12:01 PM    To: Arcola JanskyBethany M Cook, CMA   Can you please schedule this pt to see Dr. Mayford Knifeurner ? pt had sleep study in May  Thanks

## 2015-07-24 ENCOUNTER — Telehealth: Payer: Self-pay | Admitting: Internal Medicine

## 2015-07-24 NOTE — Telephone Encounter (Signed)
Mark Roberson is calling for verbals on Pt to 3x/week for 2 weeks starting 07/23/2015.

## 2015-07-25 ENCOUNTER — Telehealth: Payer: Self-pay | Admitting: Internal Medicine

## 2015-07-25 NOTE — Telephone Encounter (Signed)
No, this is likely viral and does not need antibiotics. IF still persistent needs visit.

## 2015-07-25 NOTE — Telephone Encounter (Signed)
States that there is a stomach bug going through house hold.  States that patient has cough but lungs sounds clear.  Would like to know if patient could be prescribed a zpak.  Patient uses [facility] on [address].

## 2015-07-25 NOTE — Telephone Encounter (Signed)
I spoke with patient. Please call and schedule an office visit for him to come in. Dr. Okey Duprerawford will not send in an antibiotic without a visit.

## 2015-07-25 NOTE — Telephone Encounter (Signed)
FYI: Patient state it was not a stomach bug but upper respiratory.  I told him he would need to make an OV.  Patient did not want to schedule anything.

## 2015-07-25 NOTE — Telephone Encounter (Signed)
Called and spoke with Loraine LericheMark and gave verbal orders.

## 2015-07-25 NOTE — Telephone Encounter (Signed)
Left message approving OT for 2 times a week and activity tolerance.

## 2015-07-25 NOTE — Telephone Encounter (Signed)
Requesting verbal orders for OT for 2 times a week for three weeks to continue working activity tolerance.

## 2015-07-26 NOTE — Telephone Encounter (Signed)
Patient did not want to schedule an appointment. Is there anything else that needs to be done?

## 2015-07-26 NOTE — Telephone Encounter (Signed)
Patient does not want to make appt

## 2015-07-26 NOTE — Telephone Encounter (Signed)
Nothing needs to be done now.

## 2015-07-31 NOTE — Progress Notes (Signed)
Patient ID: Mark GoldsMichael Roberson, male   DOB: May 11, 1946, 69 y.o.   MRN: [id number]    [facility]      Allergies  Allergen Reactions  . Percocet [Oxycodone-Acetaminophen] Other (See Comments)    hallucination  . Penicillins Hives    Chief Complaint  Patient presents with  . Discharge Note    HPI:  He is being discharged to home with home health for pt/ot/rn. He will not need dme. He will need his prescriptions to be written and will to follow up with his pcp.  He was hospitalized for his congestive heart failure.    Past Medical History  Diagnosis Date  . CHF (congestive heart failure) (HCC)   . Atrial fibrillation (HCC)   . Diabetes mellitus without complication (HCC)   . Hypertension   . Sleep apnea   . Asthma   . CKD (chronic kidney disease), stage III   . Anemia     Past Surgical History  Procedure Laterality Date  . 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 Wound debridement Right   . Multiple extractions with alveoloplasty N/A 01/17/2015    Procedure: Extraction of tooth #'s 14,15, 16 with alveolopalsty;  Surgeon: Charlynne Panderonald F Kulinski, DDS;  [facility];  Service: Oral Surgery;  Laterality: N/A;  . Cardioversion N/A 04/03/2015    Procedure: CARDIOVERSION;  Surgeon: Laurey Moralealton S McLean, MD;  [facility] END[facility];  Service: Cardiovascular;  Laterality: N/A;    VITAL SIGNS BP 140/61 mmHg  Pulse 60  Ht 5\' 11"  (1.803 m)  Wt 311 lb (141.069 kg)  BMI 43.40 kg/m2  SpO2 97%  Patient's Medications  New Prescriptions   No medications on file  Previous Medications   ACETAMINOPHEN (TYLENOL) 500 MG TABLET    Take 500 mg by mouth every 4 (four) hours as needed (pain). Do not exceed 4 gms of tylenol in 24 hours   ALBUTEROL (PROVENTIL) (2.5 MG/3ML) 0.083% NEBULIZER SOLUTION    Take 3 mLs (2.5 mg total) by nebulization every 6 (six) hours as needed for wheezing or shortness of breath.   ATORVASTATIN (LIPITOR) 20 MG TABLET    Take 20 mg by mouth at  bedtime.    CHOLECALCIFEROL (VITAMIN D) 2000 UNITS TABLET    Take 2,000 Units by mouth daily.   GABAPENTIN (NEURONTIN) 300 MG CAPSULE    Take 300 mg by mouth 3 (three) times daily.   INSULIN ASPART (NOVOLOG FLEXPEN) 100 UNIT/ML FLEXPEN    3 times a day (just before each meal) 40-30-40 units   INSULIN GLARGINE (LANTUS SOLOSTAR) 100 UNIT/ML SOLOSTAR PEN    Inject 40 Units into the skin at bedtime.    LEVOTHYROXINE (SYNTHROID, LEVOTHROID) 25 MCG TABLET    TAKE 1 TABLET BY MOUTH EVERY MORNING   MAGNESIUM 250 MG TABS    Take 500 mg by mouth 2 (two) times daily.    METOLAZONE (ZAROXOLYN) 2.5 MG TABLET    Take 1 tablet (2.5 mg total) by mouth 2 (two) times a week. Every Mon and Fri   OMEGA 3-6-9 CAPS    Take 2 capsules by mouth daily.   POTASSIUM CHLORIDE SA (K-DUR,KLOR-CON) 20 MEQ TABLET    Take 1 tablet (20 mEq total) by mouth 2 (two) times a week. Every Mon and Fri with metolazone   PROAIR HFA 108 (90 BASE) MCG/ACT INHALER    INHALE 1 TO 2 PUFFS BY MOUTH FOUR TIMES DAILY AS DIRECTED AS NEEDED FOR WHEEZING AND SHORTNESS OF  BREATH   RIVAROXABAN (XARELTO) 15 MG TABS TABLET    Take 1 tablet (15 mg total) by mouth daily with breakfast.   TORSEMIDE (DEMADEX) 20 MG TABLET    Take 3 tabs in AM and 2 tabs in PM  Modified Medications   No medications on file  Discontinued Medications   No medications on file     SIGNIFICANT DIAGNOSTIC EXAMS   05-02-15: chest x-ray: Interstitial pulmonary edema compatible with mild CHF.  05-03-15: 2-d echo: Left ventricle: The cavity size was normal. Wall thickness was normal. Systolic function was vigorous. The estimated ejection fraction was in the range of 65% to 70%. Wall motion was normal; there were no regional wall motion abnormalities. Doppler parameters are consistent with elevated mean left atrial fillingpressure. - Aortic valve: Valve area (Vmax): 2.71 cm^2. - Mitral valve: Moderately calcified annulus. There was mild to moderate regurgitation. Valve area by  pressure half-time: 1.71 cm^2. Valve area by continuity equation (using LVOT flow): 2.39 cm^2. - Left atrium: The atrium was severely dilated. - Right ventricle: The cavity size was mildly dilated. - Right atrium: The atrium was moderately to severely dilated. - Pulmonary arteries: Systolic pressure was moderately increased. PA peak pressure: 58 mm Hg (S).  05-04-15:renal ultrasound: Small left kidney with renal cortical thinning compared to the normal-appearing right kidney. This size discrepancy raises question of renal artery stenosis on the left. In this regard, question whether patient is hypertensive. Study otherwise unremarkable.   LABS REVIEWED:   05-02-15: wbc 8.5; hgb 8.2; hct 28.4; mcv 86.6 ;pt 173; glucose 212; bun 83; creat 2.80; k+4.0  na++134; mag 2.8; BNP 284.3 05-06-15: wbc 7.7; hgb 83; hct 28.0; mcv 86.2; plt 179; glucose 69; bun 72; creat 1.93; k+4.0; na++137;  05-18-15: wbc 8.5; hgb 8.5; hct 28.9; mcv 87.6; plt 157; glucose 173; bun 77; creat 2.18; k+4.2; na++141; tsh 12.066  05-24-15: glucose 155; bun 65; creat 1.65; k+ 4.1; na++140       Review of Systems Constitutional: Negative for appetite change and fatigue.  HENT: Negative for congestion.   Respiratory: Negative for cough, chest tightness and shortness of breath.   Cardiovascular: Negative for chest pain, palpitations and leg swelling.  Gastrointestinal: Negative for nausea, abdominal pain, diarrhea and constipation.  Musculoskeletal: Negative for myalgias and arthralgias.  Skin: Negative for pallor.  Neurological: Negative for dizziness.  Psychiatric/Behavioral: The patient is not nervous/anxious.      Physical Exam Constitutional: He is oriented to person, place, and time. No distress.  Obese   Eyes: Conjunctivae are normal.  Neck: Neck supple. No JVD present. No thyromegaly present.  Cardiovascular: Normal rate, regular rhythm and intact distal pulses.   Respiratory: Effort normal and breath sounds  normal. No respiratory distress. He has no wheezes.  GI: Soft. Bowel sounds are normal. He exhibits no distension. There is no tenderness.  Musculoskeletal: He exhibits edema.  Able to move all extremities  Has 1+ lower extremity edema   Lymphadenopathy:    He has no cervical adenopathy.  Neurological: He is alert and oriented to person, place, and time.  Skin: Skin is warm and dry. He is not diaphoretic.  Psychiatric: He has a normal mood and affect.     ASSESSMENT/ PLAN:  Will discharge him to home with home health for pt/ot/rn to evaluate and treat as indicated for gait; adl retraining and medication management. He will not need any dme. His prescriptions have been written for a 30 day supply of his medications. He has  a follow up with Dr. Darliss Ridgel on 06-27-15 at 10:30 am   Time spent with patient  40  minutes >50% time spent counseling; reviewing medical record; tests; labs; and developing future plan of care   Synthia Innocent NP [facility]  Contact [phone] Monday through Friday 8am- 5pm  After hours call [phone]

## 2015-08-03 ENCOUNTER — Ambulatory Visit: Payer: Self-pay | Admitting: Internal Medicine

## 2015-08-04 ENCOUNTER — Telehealth: Payer: Self-pay | Admitting: *Deleted

## 2015-08-04 DIAGNOSIS — G4733 Obstructive sleep apnea (adult) (pediatric): Secondary | ICD-10-CM

## 2015-08-04 NOTE — Telephone Encounter (Signed)
Orders for CPAP have been placed - [facility] has been notified.  Once he receives his machine we will schedule 10 week follow-up.  Caregiver, Whitewood SinkRita, will need to be called to schedule appointment at [phone].

## 2015-08-08 ENCOUNTER — Telehealth: Payer: Self-pay | Admitting: Internal Medicine

## 2015-08-08 NOTE — Telephone Encounter (Signed)
Left message on voice mail giving verbal order for one more visit.

## 2015-08-08 NOTE — Telephone Encounter (Signed)
Harriett SineNancy from [facility] requesting verbal order for one more visit this week to complete discharge from OT She can be reached at [phone]

## 2015-08-11 ENCOUNTER — Telehealth: Payer: Self-pay | Admitting: Endocrinology

## 2015-08-11 NOTE — Telephone Encounter (Signed)
Pharmacy called stating that they were being audited   [facility]  Call back: [phone]

## 2015-08-11 NOTE — Telephone Encounter (Signed)
Attempted to reach the pharmacy, was unsuccessful. Will try again at a later time.

## 2015-08-11 NOTE — Telephone Encounter (Signed)
[facility] requested last office not and A1C to be sent for the patient so they would be compliant and the pt could get his test strips. Information faxed per request.

## 2015-08-14 ENCOUNTER — Telehealth: Payer: Self-pay | Admitting: Endocrinology

## 2015-08-14 NOTE — Telephone Encounter (Signed)
Requested a call back from the pt to verify which test strips he is currently using.

## 2015-08-14 NOTE — Telephone Encounter (Signed)
Pts wife requesting humalog and one touch test strips and lancets to be called into [facility]

## 2015-08-15 MED ORDER — HUMALOG KWIKPEN 100 UNIT/ML ~~LOC~~ SOPN: PEN_INJECTOR | SUBCUTANEOUS | Status: DC

## 2015-08-15 MED ORDER — BLOOD GLUCOSE TEST VI STRP: ORAL_STRIP | Status: DC

## 2015-08-15 NOTE — Telephone Encounter (Signed)
Rx submitted for rx's listed below.

## 2015-08-15 NOTE — Telephone Encounter (Signed)
Patient called back, she need a refill of Humalog, and one touch test strips is what she is asking for, (she is totally out of Humalog)

## 2015-08-29 ENCOUNTER — Other Ambulatory Visit: Payer: Self-pay | Admitting: Adult Health

## 2015-09-06 ENCOUNTER — Other Ambulatory Visit: Payer: Self-pay | Admitting: Adult Health

## 2015-09-12 ENCOUNTER — Ambulatory Visit: Payer: Self-pay | Admitting: Internal Medicine

## 2015-09-13 ENCOUNTER — Emergency Department (HOSPITAL_COMMUNITY): Payer: Medicare Other

## 2015-09-13 ENCOUNTER — Encounter (HOSPITAL_COMMUNITY): Payer: Self-pay | Admitting: Emergency Medicine

## 2015-09-13 ENCOUNTER — Telehealth (HOSPITAL_COMMUNITY): Payer: Self-pay | Admitting: Vascular Surgery

## 2015-09-13 ENCOUNTER — Inpatient Hospital Stay (HOSPITAL_COMMUNITY)
Admission: EM | Admit: 2015-09-13 | Discharge: 2015-09-22 | DRG: 291 | Disposition: A | Discharge status: Home / Self Care | Payer: Medicare Other | Attending: Internal Medicine | Admitting: Internal Medicine

## 2015-09-13 DIAGNOSIS — I5032 Chronic diastolic (congestive) heart failure: Secondary | ICD-10-CM | POA: Diagnosis present

## 2015-09-13 DIAGNOSIS — J9611 Chronic respiratory failure with hypoxia: Secondary | ICD-10-CM | POA: Diagnosis present

## 2015-09-13 DIAGNOSIS — R600 Localized edema: Secondary | ICD-10-CM

## 2015-09-13 DIAGNOSIS — N183 Chronic kidney disease, stage 3 unspecified: Secondary | ICD-10-CM | POA: Diagnosis present

## 2015-09-13 DIAGNOSIS — I10 Essential (primary) hypertension: Secondary | ICD-10-CM | POA: Diagnosis present

## 2015-09-13 DIAGNOSIS — Z794 Long term (current) use of insulin: Secondary | ICD-10-CM | POA: Diagnosis not present

## 2015-09-13 DIAGNOSIS — B9620 Unspecified Escherichia coli [E. coli] as the cause of diseases classified elsewhere: Secondary | ICD-10-CM | POA: Diagnosis present

## 2015-09-13 DIAGNOSIS — N390 Urinary tract infection, site not specified: Secondary | ICD-10-CM | POA: Diagnosis present

## 2015-09-13 DIAGNOSIS — I5022 Chronic systolic (congestive) heart failure: Secondary | ICD-10-CM

## 2015-09-13 DIAGNOSIS — Z6841 Body Mass Index (BMI) 40.0 and over, adult: Secondary | ICD-10-CM

## 2015-09-13 DIAGNOSIS — Z66 Do not resuscitate: Secondary | ICD-10-CM | POA: Diagnosis present

## 2015-09-13 DIAGNOSIS — Z7901 Long term (current) use of anticoagulants: Secondary | ICD-10-CM

## 2015-09-13 DIAGNOSIS — I509 Heart failure, unspecified: Secondary | ICD-10-CM

## 2015-09-13 DIAGNOSIS — Z9119 Patient's noncompliance with other medical treatment and regimen: Secondary | ICD-10-CM | POA: Diagnosis not present

## 2015-09-13 DIAGNOSIS — D62 Acute posthemorrhagic anemia: Secondary | ICD-10-CM | POA: Diagnosis present

## 2015-09-13 DIAGNOSIS — D649 Anemia, unspecified: Secondary | ICD-10-CM | POA: Diagnosis present

## 2015-09-13 DIAGNOSIS — N179 Acute kidney failure, unspecified: Secondary | ICD-10-CM | POA: Diagnosis not present

## 2015-09-13 DIAGNOSIS — Z79899 Other long term (current) drug therapy: Secondary | ICD-10-CM

## 2015-09-13 DIAGNOSIS — I2781 Cor pulmonale (chronic): Secondary | ICD-10-CM | POA: Diagnosis present

## 2015-09-13 DIAGNOSIS — I130 Hypertensive heart and chronic kidney disease with heart failure and stage 1 through stage 4 chronic kidney disease, or unspecified chronic kidney disease: Secondary | ICD-10-CM | POA: Diagnosis present

## 2015-09-13 DIAGNOSIS — E1121 Type 2 diabetes mellitus with diabetic nephropathy: Secondary | ICD-10-CM | POA: Diagnosis not present

## 2015-09-13 DIAGNOSIS — G4733 Obstructive sleep apnea (adult) (pediatric): Secondary | ICD-10-CM | POA: Diagnosis present

## 2015-09-13 DIAGNOSIS — E1129 Type 2 diabetes mellitus with other diabetic kidney complication: Secondary | ICD-10-CM | POA: Diagnosis present

## 2015-09-13 DIAGNOSIS — I2510 Atherosclerotic heart disease of native coronary artery without angina pectoris: Secondary | ICD-10-CM | POA: Diagnosis present

## 2015-09-13 DIAGNOSIS — Z880 Allergy status to penicillin: Secondary | ICD-10-CM

## 2015-09-13 DIAGNOSIS — E1122 Type 2 diabetes mellitus with diabetic chronic kidney disease: Secondary | ICD-10-CM | POA: Diagnosis present

## 2015-09-13 DIAGNOSIS — R040 Epistaxis: Secondary | ICD-10-CM | POA: Diagnosis present

## 2015-09-13 DIAGNOSIS — E669 Obesity, unspecified: Secondary | ICD-10-CM | POA: Diagnosis present

## 2015-09-13 DIAGNOSIS — I482 Chronic atrial fibrillation: Secondary | ICD-10-CM | POA: Diagnosis present

## 2015-09-13 DIAGNOSIS — I481 Persistent atrial fibrillation: Secondary | ICD-10-CM | POA: Diagnosis present

## 2015-09-13 DIAGNOSIS — Z885 Allergy status to narcotic agent status: Secondary | ICD-10-CM

## 2015-09-13 DIAGNOSIS — I4891 Unspecified atrial fibrillation: Secondary | ICD-10-CM | POA: Diagnosis present

## 2015-09-13 DIAGNOSIS — Z9981 Dependence on supplemental oxygen: Secondary | ICD-10-CM

## 2015-09-13 DIAGNOSIS — I5033 Acute on chronic diastolic (congestive) heart failure: Secondary | ICD-10-CM | POA: Diagnosis present

## 2015-09-13 DIAGNOSIS — Z23 Encounter for immunization: Secondary | ICD-10-CM | POA: Diagnosis not present

## 2015-09-13 DIAGNOSIS — Z955 Presence of coronary angioplasty implant and graft: Secondary | ICD-10-CM | POA: Diagnosis not present

## 2015-09-13 LAB — BASIC METABOLIC PANEL
ANION GAP: 10 (ref 5–15)
BUN: 40 mg/dL — ABNORMAL HIGH (ref 6–20)
CHLORIDE: 93 mmol/L — AB (ref 101–111)
CO2: 36 mmol/L — ABNORMAL HIGH (ref 22–32)
Calcium: 8.8 mg/dL — ABNORMAL LOW (ref 8.9–10.3)
Creatinine, Ser: 1.63 mg/dL — ABNORMAL HIGH (ref 0.61–1.24)
GFR, EST AFRICAN AMERICAN: 48 mL/min — AB (ref >60)
GFR, EST NON AFRICAN AMERICAN: 41 mL/min — AB (ref >60)
Glucose, Bld: 220 mg/dL — ABNORMAL HIGH (ref 65–99)
POTASSIUM: 3.6 mmol/L (ref 3.5–5.1)
SODIUM: 139 mmol/L (ref 135–145)

## 2015-09-13 LAB — URINE MICROSCOPIC-ADD ON

## 2015-09-13 LAB — URINALYSIS, ROUTINE W REFLEX MICROSCOPIC
Bilirubin Urine: NEGATIVE
GLUCOSE, UA: NEGATIVE mg/dL
KETONES UR: NEGATIVE mg/dL
Nitrite: POSITIVE — AB
PH: 5.5 (ref 5.0–8.0)
Protein, ur: NEGATIVE mg/dL
Specific Gravity, Urine: 1.012 (ref 1.005–1.030)

## 2015-09-13 LAB — CBC
HEMATOCRIT: 23.7 % — AB (ref 39.0–52.0)
HEMOGLOBIN: 6.8 g/dL — AB (ref 13.0–17.0)
MCH: 26.1 pg (ref 26.0–34.0)
MCHC: 28.7 g/dL — ABNORMAL LOW (ref 30.0–36.0)
MCV: 90.8 fL (ref 78.0–100.0)
Platelets: 167 10*3/uL (ref 150–400)
RBC: 2.61 MIL/uL — AB (ref 4.22–5.81)
RDW: 16.5 % — ABNORMAL HIGH (ref 11.5–15.5)
WBC: 8.6 10*3/uL (ref 4.0–10.5)

## 2015-09-13 LAB — POC OCCULT BLOOD, ED: Fecal Occult Bld: NEGATIVE

## 2015-09-13 LAB — PREPARE RBC (CROSSMATCH)

## 2015-09-13 LAB — I-STAT TROPONIN, ED: Troponin i, poc: 0.02 ng/mL (ref 0.00–0.08)

## 2015-09-13 LAB — GLUCOSE, CAPILLARY: Glucose-Capillary: 192 mg/dL — ABNORMAL HIGH (ref 65–99)

## 2015-09-13 LAB — BRAIN NATRIURETIC PEPTIDE: B NATRIURETIC PEPTIDE 5: 280.4 pg/mL — AB (ref 0.0–100.0)

## 2015-09-13 MED ORDER — INSULIN GLARGINE 100 UNIT/ML ~~LOC~~ SOLN
40.0000 [IU] | Freq: Every day | SUBCUTANEOUS | Status: DC
  Administered 2015-09-14 – 2015-09-15 (×3): 40 [IU] via SUBCUTANEOUS
  Filled 2015-09-13 (×4): qty 0.4

## 2015-09-13 MED ORDER — GABAPENTIN 300 MG PO CAPS
300.0000 mg | ORAL_CAPSULE | Freq: Two times a day (BID) | ORAL | Status: DC
  Administered 2015-09-14 – 2015-09-22 (×18): 300 mg via ORAL
  Filled 2015-09-13 (×19): qty 1

## 2015-09-13 MED ORDER — FUROSEMIDE 10 MG/ML IJ SOLN
20.0000 mg | Freq: Once | INTRAMUSCULAR | Status: AC
  Administered 2015-09-14: 20 mg via INTRAVENOUS
  Filled 2015-09-13: qty 2

## 2015-09-13 MED ORDER — ONDANSETRON HCL 4 MG/2ML IJ SOLN: 4.0000 mg | Freq: Four times a day (QID) | INTRAMUSCULAR | Status: DC | PRN

## 2015-09-13 MED ORDER — ACETAMINOPHEN 325 MG PO TABS
650.0000 mg | ORAL_TABLET | Freq: Once | ORAL | Status: AC
  Administered 2015-09-14: 650 mg via ORAL
  Filled 2015-09-13: qty 2

## 2015-09-13 MED ORDER — DIPHENHYDRAMINE HCL 25 MG PO CAPS
25.0000 mg | ORAL_CAPSULE | Freq: Once | ORAL | Status: AC
  Administered 2015-09-14: 25 mg via ORAL
  Filled 2015-09-13: qty 1

## 2015-09-13 MED ORDER — ATORVASTATIN CALCIUM 20 MG PO TABS
20.0000 mg | ORAL_TABLET | Freq: Every day | ORAL | Status: DC
  Administered 2015-09-14 – 2015-09-21 (×9): 20 mg via ORAL
  Filled 2015-09-13 (×9): qty 1

## 2015-09-13 MED ORDER — FUROSEMIDE 10 MG/ML IJ SOLN
40.0000 mg | Freq: Two times a day (BID) | INTRAMUSCULAR | Status: DC
  Administered 2015-09-14: 40 mg via INTRAVENOUS
  Filled 2015-09-13: qty 4

## 2015-09-13 MED ORDER — INFLUENZA VAC SPLIT QUAD 0.5 ML IM SUSY
0.5000 mL | PREFILLED_SYRINGE | INTRAMUSCULAR | Status: AC
  Administered 2015-09-14: 0.5 mL via INTRAMUSCULAR
  Filled 2015-09-13: qty 0.5

## 2015-09-13 MED ORDER — ALBUTEROL SULFATE (2.5 MG/3ML) 0.083% IN NEBU
2.5000 mg | INHALATION_SOLUTION | Freq: Four times a day (QID) | RESPIRATORY_TRACT | Status: DC | PRN
  Administered 2015-09-16: 2.5 mg via RESPIRATORY_TRACT
  Filled 2015-09-13: qty 3

## 2015-09-13 MED ORDER — FUROSEMIDE 10 MG/ML IJ SOLN
80.0000 mg | Freq: Once | INTRAMUSCULAR | Status: AC
  Administered 2015-09-13: 80 mg via INTRAVENOUS
  Filled 2015-09-13: qty 8

## 2015-09-13 MED ORDER — INSULIN ASPART 100 UNIT/ML ~~LOC~~ SOLN
0.0000 [IU] | Freq: Every day | SUBCUTANEOUS | Status: DC
  Administered 2015-09-14 – 2015-09-15 (×3): 2 [IU] via SUBCUTANEOUS
  Administered 2015-09-16: 3 [IU] via SUBCUTANEOUS
  Administered 2015-09-17: 2 [IU] via SUBCUTANEOUS
  Administered 2015-09-18 – 2015-09-21 (×2): 3 [IU] via SUBCUTANEOUS

## 2015-09-13 MED ORDER — SODIUM CHLORIDE 0.9 % IV SOLN
Freq: Once | INTRAVENOUS | Status: AC
  Administered 2015-09-14: 02:00:00 via INTRAVENOUS

## 2015-09-13 MED ORDER — SODIUM CHLORIDE 0.9 % IJ SOLN: 3.0000 mL | INTRAMUSCULAR | Status: DC | PRN

## 2015-09-13 MED ORDER — SODIUM CHLORIDE 0.9 % IV SOLN: 250.0000 mL | INTRAVENOUS | Status: DC | PRN

## 2015-09-13 MED ORDER — ACETAMINOPHEN 325 MG PO TABS
650.0000 mg | ORAL_TABLET | ORAL | Status: DC | PRN
  Administered 2015-09-16: 650 mg via ORAL
  Filled 2015-09-13: qty 2

## 2015-09-13 MED ORDER — SODIUM CHLORIDE 0.9 % IV SOLN
Freq: Once | INTRAVENOUS | Status: AC
  Administered 2015-09-13: 21:00:00 via INTRAVENOUS

## 2015-09-13 MED ORDER — ONDANSETRON HCL 4 MG/2ML IJ SOLN: 4.0000 mg | Freq: Three times a day (TID) | INTRAMUSCULAR | Status: DC | PRN

## 2015-09-13 MED ORDER — INSULIN GLARGINE 100 UNIT/ML SOLOSTAR PEN: 40.0000 [IU] | PEN_INJECTOR | Freq: Every day | SUBCUTANEOUS | Status: DC

## 2015-09-13 MED ORDER — INSULIN ASPART 100 UNIT/ML ~~LOC~~ SOLN
0.0000 [IU] | Freq: Three times a day (TID) | SUBCUTANEOUS | Status: DC
  Administered 2015-09-14: 5 [IU] via SUBCUTANEOUS
  Administered 2015-09-14: 2 [IU] via SUBCUTANEOUS
  Administered 2015-09-14: 5 [IU] via SUBCUTANEOUS
  Administered 2015-09-15: 2 [IU] via SUBCUTANEOUS
  Administered 2015-09-15 – 2015-09-16 (×3): 5 [IU] via SUBCUTANEOUS
  Administered 2015-09-16: 11 [IU] via SUBCUTANEOUS
  Administered 2015-09-16 – 2015-09-17 (×2): 3 [IU] via SUBCUTANEOUS
  Administered 2015-09-17: 11 [IU] via SUBCUTANEOUS
  Administered 2015-09-17: 2 [IU] via SUBCUTANEOUS
  Administered 2015-09-18: 5 [IU] via SUBCUTANEOUS

## 2015-09-13 MED ORDER — SODIUM CHLORIDE 0.9 % IJ SOLN
3.0000 mL | Freq: Two times a day (BID) | INTRAMUSCULAR | Status: DC
  Administered 2015-09-14 – 2015-09-22 (×18): 3 mL via INTRAVENOUS

## 2015-09-13 NOTE — H&P (Signed)
PCP:  Myrlene BrokerElizabeth A Crawford, MD  [facility]  Referring provider [place]   Chief Complaint:  Epistaxis  HPI: Mark GoldsMichael Roberson is a 69 y.o. male   has a past medical history of CHF (congestive heart failure) (HCC); Atrial fibrillation (HCC); Diabetes mellitus without complication (HCC); Hypertension; Sleep apnea; Asthma; CKD (chronic kidney disease), stage III; and Anemia.   Presented with increased lower extremity edema and epistaxis. Per records in October the patient's weight was 309 pounds but today went up to 330 pounds has been gradual going up. He hasn't been complaining of shortness of breath with progressive lower extremity edema His care giver has called in to report that she's been having significant nosebleeding lasting about 2 hours described as stream of blood. At this time epistaxis stopped. Nursing home has called in to cardiology recommended presentation to emergency department. In ER patient was found to be anemic with hemoglobin down to 6.6 He denies any shortness of breath, no chest pain.   The patient was having significant peripheral edema and is given IV Lasix. He was started on transfusion of 2 units   Patient is on chronic oxygen 2 L at home. He has chronic diastolic heart failure followed by cardiology has history of atrial fibrillation on chronic anticoagulation. The test patient had episodes of epistaxis during which a Xarelto has to be held. Patient have had multiple admissions in the past year due to acute on chronic diastolic heart failure usually responded well to IV Lasix. Last admission was in August 2016.  Patient has known chronic kidney disease with baseline creatinine 1.6 and history of anemia with baseline hemoglobin 8.8  Patient had some prior stays at SNF due to deconditioning thinks his last SNF admit was in June.  Hospitalist was called for admission for CHF exacerbation and symptomatic anemia  Review of Systems:    Pertinent positives include: Bilateral lower extremity swelling , nosebleed  Constitutional:  No weight loss, night sweats, Fevers, chills, fatigue, weight loss  HEENT:  No headaches, Difficulty swallowing,Tooth/dental problems,Sore throat,  No sneezing, itching, ear ache, nasal congestion, post nasal drip,  Cardio-vascular:  No chest pain, Orthopnea, PND, anasarca, dizziness, palpitations.no  GI:  No heartburn, indigestion, abdominal pain, nausea, vomiting, diarrhea, change in bowel habits, loss of appetite, melena, blood in stool, hematemesis Resp:  no shortness of breath at rest. No dyspnea on exertion, No excess mucus, no productive cough, No non-productive cough, No coughing up of blood.No change in color of mucus.No wheezing. Skin:  no rash or lesions. No jaundice GU:  no dysuria, change in color of urine, no urgency or frequency. No straining to urinate.  No flank pain.  Musculoskeletal:  No joint pain or no joint swelling. No decreased range of motion. No back pain.  Psych:  No change in mood or affect. No depression or anxiety. No memory loss.  Neuro: no localizing neurological complaints, no tingling, no weakness, no double vision, no gait abnormality, no slurred speech, no confusion  Otherwise ROS are negative except for above, 10 systems were reviewed  Past Medical History: Past Medical History  Diagnosis Date  . CHF (congestive heart failure) (HCC)   . Atrial fibrillation (HCC)   . Diabetes mellitus without complication (HCC)   . Hypertension   . Sleep apnea   . Asthma   . CKD (chronic kidney disease), stage III   . Anemia    Past Surgical History  Procedure Laterality Date  . Joint replacement  08/31/14  L knee  . Wound debridement Right   . Multiple extractions with alveoloplasty N/A 01/17/2015    Procedure: Extraction of tooth #'s 14,15, 16 with alveolopalsty;  Surgeon: Charlynne Pander, DDS;  Location: [facility] OR;  Service: Oral Surgery;  Laterality: N/A;  .  Cardioversion N/A 04/03/2015    Procedure: CARDIOVERSION;  Surgeon: Laurey Morale, MD;  Location: [facility] ENDOSCOPY;  Service: Cardiovascular;  Laterality: N/A;     Medications: Prior to Admission medications   Medication Sig Start Date End Date Taking? Authorizing Provider  acetaminophen (TYLENOL) 500 MG tablet Take 500 mg by mouth every 4 (four) hours as needed (pain). Do not exceed 4 gms of tylenol in 24 hours   Yes Historical Provider, MD  albuterol (PROVENTIL) (2.5 MG/3ML) 0.083% nebulizer solution Take 3 mLs (2.5 mg total) by nebulization every 6 (six) hours as needed for wheezing or shortness of breath. 11/14/14  Yes [name], MD  atorvastatin (LIPITOR) 20 MG tablet Take 20 mg by mouth at bedtime.    Yes Historical Provider, MD  cholecalciferol (VITAMIN D) 1000 UNITS tablet Take 1,000 Units by mouth daily.   Yes Historical Provider, MD  gabapentin (NEURONTIN) 300 MG capsule Take 300 mg by mouth 2 (two) times daily.    Yes Historical Provider, MD  HUMALOG KWIKPEN 100 UNIT/ML KiwkPen Inject 3 time daily just before each meal 45-35-45 Patient taking differently: Inject 30-40 Units into the skin 3 (three) times daily. Inject 3 time daily just before each meal 40-30-40 08/15/15  Yes Romero Belling, MD  Insulin Glargine (LANTUS SOLOSTAR) 100 UNIT/ML Solostar Pen Inject 40 Units into the skin at bedtime.    Yes Historical Provider, MD  Magnesium 400 MG CAPS Take 400 mg by mouth daily.   Yes Historical Provider, MD  metolazone (ZAROXOLYN) 2.5 MG tablet Take 1 tablet (2.5 mg total) by mouth 2 (two) times a week. Every Mon and Fri Patient taking differently: Take 2.5 mg by mouth every Monday.  05/18/15  Yes Laurey Morale, MD  niacin 250 MG tablet Take 250 mg by mouth daily.   Yes Historical Provider, MD  Omega-3 Fatty Acids (FISH OIL) 1000 MG CAPS Take 2,000 mg by mouth daily.   Yes Historical Provider, MD  potassium chloride SA (K-DUR,KLOR-CON) 20 MEQ tablet Take 1 tablet (20 mEq total) by  mouth 2 (two) times a week. Every Mon and Fri with metolazone Patient taking differently: Take 20 mEq by mouth every Monday.  05/18/15  Yes Laurey Morale, MD  PROAIR HFA 108 [id number] BASE) MCG/ACT inhaler INHALE 1 TO 2 PUFFS BY MOUTH FOUR TIMES DAILY AS DIRECTED AS NEEDED FOR WHEEZING AND SHORTNESS OF BREATH 04/06/15  Yes [name], MD  Rivaroxaban (XARELTO) 15 MG TABS tablet Take 1 tablet (15 mg total) by mouth daily with breakfast. Patient taking differently: Take 15 mg by mouth daily with supper.  06/26/15  Yes Laurey Morale, MD  torsemide (DEMADEX) 20 MG tablet Take 3 tabs in AM and 2 tabs in PM Patient taking differently: Take 40 mg by mouth 2 (two) times daily. Take 2 tabs in AM and 2 tabs in PM 05/18/15  Yes Laurey Morale, MD  levothyroxine (SYNTHROID, LEVOTHROID) 25 MCG tablet TAKE 1 TABLET BY MOUTH EVERY MORNING Patient not taking: Reported on 09/13/2015 07/04/15   Sharee Holster, NP    Allergies:   Allergies  Allergen Reactions  . Percocet [Oxycodone-Acetaminophen] Other (See Comments)    hallucination  . Penicillins Hives  Has patient had a PCN reaction causing immediate rash, facial/tongue/throat swelling, SOB or lightheadedness with hypotension: No Has patient had a PCN reaction causing severe rash involving mucus membranes or skin necrosis: No Has patient had a PCN reaction that required hospitalization No Has patient had a PCN reaction occurring within the last 10 years: No If all of the above answers are "NO", then may proceed with Cephalosporin use.    Social History:  Ambulatory  walker   Lives at home with caregiver     reports that he has never smoked. He has never used smokeless tobacco. He reports that he drinks alcohol. He reports that he does not use illicit drugs.    Family History: family history includes COPD in his mother; Cancer in his sister; Heart disease in his brother and father. There is no history of Diabetes.    Physical  Exam: Patient Vitals for the past 24 hrs:  BP Temp Temp src Pulse Resp SpO2 Height  09/13/15 1845 121/59 mmHg - - (!) 56 14 100 % -  09/13/15 1830 121/57 mmHg - - (!) 57 14 100 % -  09/13/15 1822 (!) 119/47 mmHg - - (!) 57 17 100 % -  09/13/15 1730 139/58 mmHg - - 62 - 100 % -  09/13/15 1709 (!) 141/53 mmHg 97.9 F (36.6 C) Oral 70 18 100 % 6' (1.829 m)    1. General:  in No Acute distress 2. Psychological: Alert and Oriented 3. Head/ENT:   Moist  Mucous Membranes                          Head Non traumatic, neck supple                          Normal  Dentition 4. SKIN: normal Skin turgor,  Skin clean Dry and intact no rash 5. Heart: Regular rate and rhythm no Murmur, Rub or gallop 6. Lungs:  no wheezes occasional mild crackles   7. Abdomen: Soft, non-tender, Non distended, obese 8. Lower extremities: no clubbing, cyanosis, 2 edema 9. Neurologically Grossly intact, moving all 4 extremities equally 10. MSK: Normal range of motion  body mass index is unknown because there is no weight on file.   Labs on Admission:   Results for orders placed or performed during the hospital encounter of 09/13/15 (from the past 24 hour(s))  Basic metabolic panel     Status: Abnormal   Collection Time: 09/13/15  6:15 PM  Result Value Ref Range   Sodium 139 135 - 145 mmol/L   Potassium 3.6 3.5 - 5.1 mmol/L   Chloride 93 (L) 101 - 111 mmol/L   CO2 36 (H) 22 - 32 mmol/L   Glucose, Bld 220 (H) 65 - 99 mg/dL   BUN 40 (H) 6 - 20 mg/dL   Creatinine, Ser 1.30 (H) 0.61 - 1.24 mg/dL   Calcium 8.8 (L) 8.9 - 10.3 mg/dL   GFR calc non Af Amer 41 (L) >60 mL/min   GFR calc Af Amer 48 (L) >60 mL/min   Anion gap 10 5 - 15  CBC     Status: Abnormal   Collection Time: 09/13/15  6:15 PM  Result Value Ref Range   WBC 8.6 4.0 - 10.5 K/uL   RBC 2.61 (L) 4.22 - 5.81 MIL/uL   Hemoglobin 6.8 (LL) 13.0 - 17.0 g/dL   HCT 86.5 (L) 78.4 - 69.6 %  MCV 90.8 78.0 - 100.0 fL   MCH 26.1 26.0 - 34.0 pg   MCHC 28.7 (L)  30.0 - 36.0 g/dL   RDW 21.3 (H) 08.6 - 57.8 %   Platelets 167 150 - 400 K/uL  Brain natriuretic peptide     Status: Abnormal   Collection Time: 09/13/15  6:15 PM  Result Value Ref Range   B Natriuretic Peptide 280.4 (H) 0.0 - 100.0 pg/mL  I-stat troponin, ED (not at [facility], [facility])     Status: None   Collection Time: 09/13/15  7:05 PM  Result Value Ref Range   Troponin i, poc 0.02 0.00 - 0.08 ng/mL   Comment 3          POC occult blood, ED Provider will collect     Status: None   Collection Time: 09/13/15  7:17 PM  Result Value Ref Range   Fecal Occult Bld NEGATIVE NEGATIVE    UA ordered  Lab Results  Component Value Date   HGBA1C 8.0 06/30/2015    CrCl cannot be calculated (Unknown ideal weight.).  BNP (last 3 results) No results for input(s): PROBNP in the last 8760 hours.  Other results:  I have pearsonaly reviewed this: ECG REPORT  Rate: 68   Rhythm: Atrial fibrillation ST&T Change: No ischemic changes QTC 472  There were no vitals filed for this visit.   Cultures: No results found for: SDES, SPECREQUEST, CULT, REPTSTATUS   Radiological Exams on Admission: Dg Chest 2 View  09/13/2015  CLINICAL DATA:  Shortness of breath for 1 day.  CHF. EXAM: CHEST  2 VIEW COMPARISON:  05/02/2015 FINDINGS: There is mild bilateral interstitial thickening. There is no focal parenchymal opacity. There is no pleural effusion or pneumothorax. There is stable cardiomegaly. The osseous structures are unremarkable. IMPRESSION: Stable cardiomegaly with mild pulmonary vascular congestion. Electronically Signed   By: Elige Ko   On: 09/13/2015 17:53    Chart has been reviewed  Family  at  Bedside    Assessment/Plan  69 yo male with history of hypertension coronary artery disease chronic diastolic heart failure and diabetes presents with peripheral lower extremity swelling and epistaxis was found to be anemic with hemoglobin down to 6.6  Present on Admission:  . Acute on chronic  diastolic CHF (congestive heart failure) (HCC) - - admit on telemetry, cycle cardiac enzymes, obtain serial ECG, to evaluate for ischemia as a cause of heart failure  monitor daily weight  diurese with IV lasix and monitor orthostatics and creatinine to avoid over diuresis.  Order echogram to evaluate EF and valves ACE/ARBi contraindicated secondary to chronic kidney disease  . OSA (obstructive sleep apnea) patient currently not using C Pap being in the process so worked up for it. Make sure he is on oxygen  . Normocytic anemia - transfuse 2 units most likely secondary to severe epistaxis. Per ER Hemoccult negative  . HTN (hypertension) continue home medications  . Epistaxis currently resolved will hold anticoagulation secondary to  severe epistaxis requiring transfusion.  . DM (diabetes mellitus), type 2 with renal complications (HCC) continue home insulin regimen order sliding scale  . CKD (chronic kidney disease), stage III currently basal need to monitor while diuresing   . Atrial fibrillation with slow ventricular response (HCC) currently rate controlled will continue home medications hold anticoagulation given epistaxis   Prophylaxis: SCD  CODE STATUS:    DNR/DNI as per patient   Disposition:   To home once workup is complete and patient is stable  Other plan as per orders.  I have spent a total of 55 min on this admission  Eleri Ruben 09/13/2015, 7:47 PM  [facility]  Pager [phone]   after 2 AM please page floor coverage PA If 7AM-7PM, please contact the day team taking care of the patient  [email or web address]  Password [email or web address]

## 2015-09-13 NOTE — ED Provider Notes (Signed)
Medical screening examination/treatment/procedure(s) were conducted as a shared visit with non-physician practitioner(s) and myself.  I personally evaluated the patient during the encounter.   EKG Interpretation   Date/Time:  Wednesday September 13 2015 17:11:37 EST Ventricular Rate:  68 PR Interval:    QRS Duration: 90 QT Interval:  444 QTC Calculation: 472 R Axis:   -16 Text Interpretation:  Atrial fibrillation Low voltage QRS Abnormal ECG No  significant change since last tracing Confirmed by Izzie Geers  MD, Rakeem Colley  ([id number]) on 09/13/2015 6:07:55 PM     Patient here with increasing leg swelling and without dyspnea. Did have a large nosebleed today. He does use oxygen chronically. On exam he has severe lower extremity edema. No active nose bleeding noted. Does have low hemoglobin and will be transfused blood. Will also be given diuretics and admitted to the hospital  Lorre NickAnthony Kadan Millstein, MD 09/13/15 [id number]

## 2015-09-13 NOTE — ED Provider Notes (Signed)
CSN: [id number]     Arrival date & time 09/13/15  1703 History   First MD Initiated Contact with Patient 09/13/15 1715     Chief Complaint  Patient presents with  . Leg Swelling  . Epistaxis   (Consider location/radiation/quality/duration/timing/severity/associated sxs/prior Treatment) HPI 69 y.o. male with a hx of afib and diastolic HF, presents to the Emergency Department today complaining of epistaxis and leg swelling. Patient reports that leg swelling has been slowly occuring over the past few weeks. He is being [facility] for management of symptoms. No Dyspnea, No CP, No Fevers. On home O2 of 2L [place]. He is currently on Xarelto due to Afib. Has had nosebleed that began today around 10a and slowly resolved around 2pm. Returns intermittently, but somewhat under control.   Echo- 04/2015: LV EF 65-75%. RV has mild dilation   Past Medical History  Diagnosis Date  . CHF (congestive heart failure) (HCC)   . Atrial fibrillation (HCC)   . Diabetes mellitus without complication (HCC)   . Hypertension   . Sleep apnea   . Asthma   . CKD (chronic kidney disease), stage III   . Anemia    Past Surgical History  Procedure Laterality Date  . 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 Wound debridement Right   . Multiple extractions with alveoloplasty N/A 01/17/2015    Procedure: Extraction of tooth #'s 14,15, 16 with alveolopalsty;  Surgeon: Charlynne Pander, DDS;  Location: [facility] OR;  Service: Oral Surgery;  Laterality: N/A;  . Cardioversion N/A 04/03/2015    Procedure: CARDIOVERSION;  Surgeon: Laurey Morale, MD;  Location: [facility] ENDOSCOPY;  Service: Cardiovascular;  Laterality: N/A;   Family History  Problem Relation Age of Onset  . COPD Mother   . Heart disease Father   . Cancer Sister     breast  . Heart disease Brother   . Diabetes Neg Hx    Social History  Substance Use Topics  . Smoking status: Never Smoker   . Smokeless tobacco: Never Used  . Alcohol Use: Yes      Comment: rare    Review of Systems  Constitutional: Negative for fever, chills, diaphoresis and fatigue.  HENT: Positive for nosebleeds. Negative for congestion, sinus pressure, sore throat and tinnitus.   Eyes: Negative for visual disturbance.  Respiratory: Negative for cough and shortness of breath.   Cardiovascular: Positive for leg swelling. Negative for chest pain.  Gastrointestinal: Negative for nausea, vomiting, abdominal pain, diarrhea and constipation.  Endocrine: Negative for cold intolerance and heat intolerance.  Musculoskeletal: Negative for back pain.  Skin: Positive for rash (Anterior R foot). Negative for color change.  Neurological: Negative for dizziness, syncope, weakness, numbness and headaches.   Allergies  Percocet and Penicillins  Home Medications   Prior to Admission medications   Medication Sig Start Date End Date Taking? Authorizing Provider  acetaminophen (TYLENOL) 500 MG tablet Take 500 mg by mouth every 4 (four) hours as needed (pain). Do not exceed 4 gms of tylenol in 24 hours    Historical Provider, MD  albuterol (PROVENTIL) (2.5 MG/3ML) 0.083% nebulizer solution Take 3 mLs (2.5 mg total) by nebulization every 6 (six) hours as needed for wheezing or shortness of breath. 11/14/14   [name], MD  atorvastatin (LIPITOR) 20 MG tablet Take 20 mg by mouth at bedtime.     Historical Provider, MD  Cholecalciferol (VITAMIN D) 2000 UNITS tablet Take 2,000 Units by mouth  daily.    Historical Provider, MD  gabapentin (NEURONTIN) 300 MG capsule Take 300 mg by mouth 3 (three) times daily.    Historical Provider, MD  Glucose Blood (BLOOD GLUCOSE TEST STRIPS) STRP Use to check blood sugar 4 times per day 08/15/15   Romero BellingSean Ellison, MD  HUMALOG KWIKPEN 100 UNIT/ML KiwkPen Inject 3 time daily just before each meal 45-35-45 08/15/15   Romero BellingSean Ellison, MD  insulin aspart (NOVOLOG FLEXPEN) 100 UNIT/ML FlexPen 3 times a day (just before each meal) 40-30-40 units     Historical Provider, MD  Insulin Glargine (LANTUS SOLOSTAR) 100 UNIT/ML Solostar Pen Inject 40 Units into the skin at bedtime.     Historical Provider, MD  levothyroxine (SYNTHROID, LEVOTHROID) 25 MCG tablet TAKE 1 TABLET BY MOUTH EVERY MORNING 07/04/15   Sharee Holstereborah S Green, NP  Magnesium 250 MG TABS Take 500 mg by mouth 2 (two) times daily.     Historical Provider, MD  metolazone (ZAROXOLYN) 2.5 MG tablet Take 1 tablet (2.5 mg total) by mouth 2 (two) times a week. Every Mon and Fri 05/18/15   Laurey Moralealton S McLean, MD  Omega 3-6-9 CAPS Take 2 capsules by mouth daily.    Historical Provider, MD  potassium chloride SA (K-DUR,KLOR-CON) 20 MEQ tablet Take 1 tablet (20 mEq total) by mouth 2 (two) times a week. Every Mon and Fri with metolazone 05/18/15   Laurey Moralealton S McLean, MD  PROAIR HFA 108 [phone](90 BASE) MCG/ACT inhaler INHALE 1 TO 2 PUFFS BY MOUTH FOUR TIMES DAILY AS DIRECTED AS NEEDED FOR WHEEZING AND SHORTNESS OF BREATH 04/06/15   Myrlene BrokerElizabeth A Crawford, MD  Rivaroxaban (XARELTO) 15 MG TABS tablet Take 1 tablet (15 mg total) by mouth daily with breakfast. 06/26/15   Laurey Moralealton S McLean, MD  torsemide (DEMADEX) 20 MG tablet Take 3 tabs in AM and 2 tabs in PM Patient taking differently: Take 2 tabs in AM and 2 tabs in PM 05/18/15   Laurey Moralealton S McLean, MD   BP 141/53 mmHg  Pulse 70  Temp(Src) 97.9 F (36.6 C) (Oral)  Resp 18  Ht 6' (1.829 m)  SpO2 100% Physical Exam  Constitutional: He is oriented to person, place, and time. He appears well-developed and well-nourished.  HENT:  Head: Normocephalic and atraumatic.  Eyes: Conjunctivae and EOM are normal. Pupils are equal, round, and reactive to light.  Neck: Neck supple.  Cardiovascular: Normal rate and intact distal pulses.  An irregularly irregular rhythm present.  BLE show 3+ pitting edema around ankle/foot. 2+ anterior tibia   Pulmonary/Chest: Effort normal. No accessory muscle usage. No respiratory distress. He has wheezes in the right lower field and the left lower  field.  Abdominal: Soft.  Musculoskeletal: He exhibits edema.  Neurological: He is alert and oriented to person, place, and time.  Skin: Skin is warm and dry. Rash (Anterior  R foot ) noted.  Psychiatric: He has a normal mood and affect. His behavior is normal. Thought content normal.   ED Course  Procedures (including critical care time) Labs Review Labs Reviewed  BASIC METABOLIC PANEL - Abnormal; Notable for the following:    Chloride 93 (*)    CO2 36 (*)    Glucose, Bld 220 (*)    BUN 40 (*)    Creatinine, Ser 1.63 (*)    Calcium 8.8 (*)    GFR calc non Af Amer 41 (*)    GFR calc Af Amer 48 (*)    All other components within normal limits  CBC - Abnormal; Notable for the following:    RBC 2.61 (*)    Hemoglobin 6.8 (*)    HCT 23.7 (*)    MCHC 28.7 (*)    RDW 16.5 (*)    All other components within normal limits  BRAIN NATRIURETIC PEPTIDE - Abnormal; Notable for the following:    B Natriuretic Peptide 280.4 (*)    All other components within normal limits  I-STAT TROPOININ, ED  I-STAT TROPOININ, ED  POC OCCULT BLOOD, ED  TYPE AND SCREEN  PREPARE RBC (CROSSMATCH)   Imaging Review Dg Chest 2 View  09/13/2015  CLINICAL DATA:  Shortness of breath for 1 day.  CHF. EXAM: CHEST  2 VIEW COMPARISON:  05/02/2015 FINDINGS: There is mild bilateral interstitial thickening. There is no focal parenchymal opacity. There is no pleural effusion or pneumothorax. There is stable cardiomegaly. The osseous structures are unremarkable. IMPRESSION: Stable cardiomegaly with mild pulmonary vascular congestion. Electronically Signed   By: Elige Ko   On: 09/13/2015 17:53   I have personally reviewed and evaluated these images and lab results as part of my medical decision-making.   EKG Interpretation   Date/Time:  Wednesday September 13 2015 17:11:37 EST Ventricular Rate:  68 PR Interval:    QRS Duration: 90 QT Interval:  444 QTC Calculation: 472 R Axis:   -16 Text Interpretation:   Atrial fibrillation Low voltage QRS Abnormal ECG No  significant change since last tracing Confirmed by ALLEN  MD, ANTHONY  ([id number]) on 09/13/2015 6:07:55 PM     MDM  I have reviewed relevant laboratory values. I have reviewed relevant imaging studies. I have reviewed the relevant EKG. I have reviewed the relevant previous healthcare records. I have reviewed EMS Documentation.I obtained HPI from historian. Cases discussed with Attending Physician  ED Course: 6:53 PM- Hgb 6.8. Ordered 2 Units RBC  Guaiac result- Neg CXR- Pulmonary Vascular Congestion  BNP- 280 7:25 PM Given 80 mg IV Lasix  7:25 PM- Contacted Medicine  7:45 PM- Spoke with Medicine   Assessment: 69yM pmh afib and CHF presents with pitting leg edema and epistaxis. Hx of admission for CHF in the past. Epistaxis has resolved. If returns, will do Afrin and hold compression. Will treat edema with IV Lasix. Patient in NAD. Most likely admission for diuresis and management of Anemia.   Disposition/Plan:  Admit to Medicine     Patient was discussed with Lorre Nick, MD   Final diagnoses:  Acute on chronic diastolic CHF (congestive heart failure) (HCC)  Anemia, unspecified anemia type       Audry Pili, PA-C 09/13/15 2239

## 2015-09-13 NOTE — Telephone Encounter (Signed)
Pt weight is up... Please advise

## 2015-09-13 NOTE — Telephone Encounter (Signed)
Spoke w/Mark Roberson, she states on 10/3 pt's wt was 309 lb at home and today he is at 330 lb.  She states the wt has been increasing since Oct and continues to go up.  She reports pt's legs are very swollen from knees down, pt doesn't c/o of SOB but for the past week has pretty much stayed in bed and sleeps all day.  She also reports pt has been having significant nose bleeds, today the bleeds lasted for about 2 hours and was a good stream of blood, she reports it has stopped now.  She also reports pt was d/c'd from home health.  Advised will discuss w/MD pt may need to admitted, will call her back with recommendations

## 2015-09-13 NOTE — ED Notes (Signed)
This RN stayed at the bedside with patient throughout the duration of the 15 minutes after starting the transfusion of the first unit of blood.

## 2015-09-13 NOTE — Telephone Encounter (Signed)
 SinkRita called about pt she only wants to talk to you, pt weight is up she states he is full of fluid.Marland Kitchen. Please advise

## 2015-09-13 NOTE — ED Notes (Signed)
Pt sts leg swelling and weight gain from CHF; pt sts nose bleeding today as well; pt denies SOB and is on home O2 at 2L

## 2015-09-13 NOTE — Telephone Encounter (Signed)
Per Dr Gala RomneyBensimhon pt should go to ER for eval and probable admit since his wt is up so much.  Spoke w/Rita she is aware and agreeable and will bring him soon

## 2015-09-14 ENCOUNTER — Inpatient Hospital Stay (HOSPITAL_COMMUNITY): Payer: Medicare Other

## 2015-09-14 DIAGNOSIS — I509 Heart failure, unspecified: Secondary | ICD-10-CM

## 2015-09-14 DIAGNOSIS — I130 Hypertensive heart and chronic kidney disease with heart failure and stage 1 through stage 4 chronic kidney disease, or unspecified chronic kidney disease: Secondary | ICD-10-CM | POA: Diagnosis not present

## 2015-09-14 DIAGNOSIS — D62 Acute posthemorrhagic anemia: Secondary | ICD-10-CM | POA: Diagnosis present

## 2015-09-14 LAB — COMPREHENSIVE METABOLIC PANEL
ALBUMIN: 2.7 g/dL — AB (ref 3.5–5.0)
ALT: 12 U/L — AB (ref 17–63)
AST: 16 U/L (ref 15–41)
Alkaline Phosphatase: 58 U/L (ref 38–126)
Anion gap: 10 (ref 5–15)
BILIRUBIN TOTAL: 1.6 mg/dL — AB (ref 0.3–1.2)
BUN: 38 mg/dL — AB (ref 6–20)
CHLORIDE: 94 mmol/L — AB (ref 101–111)
CO2: 38 mmol/L — ABNORMAL HIGH (ref 22–32)
CREATININE: 1.44 mg/dL — AB (ref 0.61–1.24)
Calcium: 9 mg/dL (ref 8.9–10.3)
GFR calc Af Amer: 56 mL/min — ABNORMAL LOW (ref >60)
GFR, EST NON AFRICAN AMERICAN: 48 mL/min — AB (ref >60)
GLUCOSE: 120 mg/dL — AB (ref 65–99)
POTASSIUM: 3.4 mmol/L — AB (ref 3.5–5.1)
Sodium: 142 mmol/L (ref 135–145)
TOTAL PROTEIN: 6.1 g/dL — AB (ref 6.5–8.1)

## 2015-09-14 LAB — MRSA PCR SCREENING: MRSA BY PCR: NEGATIVE

## 2015-09-14 LAB — GLUCOSE, CAPILLARY
Glucose-Capillary: 244 mg/dL — ABNORMAL HIGH (ref 65–99)
Glucose-Capillary: 148 mg/dL — ABNORMAL HIGH (ref 65–99)
Glucose-Capillary: 216 mg/dL — ABNORMAL HIGH (ref 65–99)
Glucose-Capillary: 215 mg/dL — ABNORMAL HIGH (ref 65–99)
Glucose-Capillary: 214 mg/dL — ABNORMAL HIGH (ref 65–99)

## 2015-09-14 LAB — TROPONIN I
TROPONIN I: 0.03 ng/mL (ref <0.031)
Troponin I: 0.03 ng/mL (ref <0.031)

## 2015-09-14 LAB — CBC WITH DIFFERENTIAL/PLATELET
Basophils Absolute: 0.1 10*3/uL (ref 0.0–0.1)
Basophils Relative: 1 %
EOS ABS: 1.1 10*3/uL — AB (ref 0.0–0.7)
EOS PCT: 12 %
HCT: 27.9 % — ABNORMAL LOW (ref 39.0–52.0)
Hemoglobin: 8.3 g/dL — ABNORMAL LOW (ref 13.0–17.0)
LYMPHS ABS: 1.3 10*3/uL (ref 0.7–4.0)
LYMPHS PCT: 15 %
MCH: 26.9 pg (ref 26.0–34.0)
MCHC: 29.7 g/dL — ABNORMAL LOW (ref 30.0–36.0)
MCV: 90.6 fL (ref 78.0–100.0)
MONO ABS: 1.2 10*3/uL — AB (ref 0.1–1.0)
Monocytes Relative: 14 %
Neutro Abs: 5.2 10*3/uL (ref 1.7–7.7)
Neutrophils Relative %: 58 %
PLATELETS: 156 10*3/uL (ref 150–400)
RBC: 3.08 MIL/uL — ABNORMAL LOW (ref 4.22–5.81)
RDW: 16 % — AB (ref 11.5–15.5)
WBC: 8.8 10*3/uL (ref 4.0–10.5)

## 2015-09-14 LAB — CBC
HEMATOCRIT: 25.9 % — AB (ref 39.0–52.0)
Hemoglobin: 7.7 g/dL — ABNORMAL LOW (ref 13.0–17.0)
MCH: 26.9 pg (ref 26.0–34.0)
MCHC: 29.7 g/dL — AB (ref 30.0–36.0)
MCV: 90.6 fL (ref 78.0–100.0)
Platelets: 147 10*3/uL — ABNORMAL LOW (ref 150–400)
RBC: 2.86 MIL/uL — ABNORMAL LOW (ref 4.22–5.81)
RDW: 16 % — AB (ref 11.5–15.5)
WBC: 8.5 10*3/uL (ref 4.0–10.5)

## 2015-09-14 MED ORDER — FUROSEMIDE 10 MG/ML IJ SOLN
80.0000 mg | Freq: Two times a day (BID) | INTRAMUSCULAR | Status: DC
  Administered 2015-09-14 – 2015-09-15 (×2): 80 mg via INTRAVENOUS
  Filled 2015-09-14 (×3): qty 8

## 2015-09-14 MED ORDER — PERFLUTREN LIPID MICROSPHERE
1.0000 mL | INTRAVENOUS | Status: AC | PRN
  Administered 2015-09-14: 2 mL via INTRAVENOUS
  Filled 2015-09-14: qty 10

## 2015-09-14 NOTE — Progress Notes (Signed)
Pt. Arrived to the unit from ED via stretcher, blood infusing. Pt. Alert and stable. No s/s of distress noted. Pt. Denies pain at this time. Family at bedside. Pt. Oriented to room and placed on telemetry. CCMD notified. Call light placed within reach.

## 2015-09-14 NOTE — Progress Notes (Signed)
PROGRESS NOTE  Mark Roberson ZOX:[id number] DOB: 02-02-1946 DOA: 09/13/2015 PCP: Myrlene BrokerElizabeth A Crawford, MD  Brief History 69 year old male with a history of persistent atrial fibrillation, diastolic CHF, chronic respiratory failure on 2 L, CKD stage III, diabetes mellitus, hypertension presented with increasing lower extremity edema and epistaxis. The patient states that he has had intermittent epistaxis for many years, and he states that he has seen ENT whom has cauterized and exposed blood vessel in the past. The patient endorses compliance with his medications, but states that he has had dietary indiscretion eating carryout [place] food at least once weekly. He feels that he has gained over 20 pounds in the past 2 months. He denies any chest pain, nausea, vomiting, diarrhea, hemoptysis, hematochezia, melena, hematuria. In the emergency department, the patient was noted to have a hemoglobin 6.8 and serum creatinine of 1.63. Fecal occult blood test was negative. The patient was transfused 2 units PRBC and started on intravenous furosemide.  Assessment/Plan: Acute on chronic diastolic CHF -Related to the patient's dietary indiscretion OSA/OHS--had sleep study May 2016 confirming OSA -However, the patient has not arranged for his CPAP machine -Increase intravenous furosemide -daily weights -dry weight 308-310 -09/14/2015 echo--EF 65-70%, no WMA OSA/OHS -has not gotten around to getting his CPAP machine Persistent atrial fibrillation -DCCV July 2016 -Reverted quickly back to atrial fibrillation -Rate controlled -Rivaroxaban on hold secondary to epistaxis Chronic respiratory failure with hypoxia -Related to the patient's cor pulmonale and OSA  -Stable on 2 L  -Normally on 2 L at home  Epistaxis  -Improved  -May be continuing to his blood loss anemia  Acute blood loss anemia  -Check iron studies  -Check B12, RBC folate  -transfused 2 units PRBCs -Baseline hemoglobin 8-9 CKD  stage III -Baseline creatinine 1.6-1.9 CAD  -Status post PCI with DES in 1990s  -No chest pain presently  -EKG without concerning ischemic changes Diabetes mellitus type 2 with nephropathy  -06/26/2015 hemoglobin A1c 8.0  -Recheck hemoglobin A1c -NovoLog sliding scale -Continue Lantus 40 units at bedtime  Lower extremity edema -Urine protein creatinine ratio -Venous duplex  Family Communication:   Pt at beside Disposition Plan:   Home 2-3 days       Procedures/Studies: Dg Chest 2 View  09/13/2015  CLINICAL DATA:  Shortness of breath for 1 day.  CHF. EXAM: CHEST  2 VIEW COMPARISON:  05/02/2015 FINDINGS: There is mild bilateral interstitial thickening. There is no focal parenchymal opacity. There is no pleural effusion or pneumothorax. There is stable cardiomegaly. The osseous structures are unremarkable. IMPRESSION: Stable cardiomegaly with mild pulmonary vascular congestion. Electronically Signed   By: Elige KoHetal  Patel   On: 09/13/2015 17:53        Subjective: Patient denies fevers, chills, headache, chest pain, dyspnea, nausea, vomiting, diarrhea, abdominal pain, dysuria, hematuria   Objective: Filed Vitals:   09/14/15 0453 09/14/15 0513 09/14/15 1036 09/14/15 1124  BP: 103/33 110/44 112/45 110/45  Pulse: 48 51  57  Temp: 97.7 F (36.5 C) 97.6 F (36.4 C)  98.1 F (36.7 C)  TempSrc: Oral   Oral  Resp: 18   18  Height:      Weight:  149.324 kg (329 lb 3.2 oz)    SpO2: 100% 99%  100%    Intake/Output Summary (Last 24 hours) at 09/14/15 1609 Last data filed at 09/14/15 1324  Gross per 24 hour  Intake 2099.5 ml  Output   2355 ml  Net -255.5  ml   Weight change:  Exam:   General:  Pt is alert, follows commands appropriately, not in acute distress  HEENT: No icterus, No thrush, No neck mass, [place]/AT  Cardiovascular: IRRR, S1/S2, no rubs, no gallops  Respiratory: Bibasilar crackles. No wheezing. Good air movement.   Abdomen: Soft/+BS, non tender, non  distended, no guarding; no hepatosplenomegaly   Extremities: 2+LE edema, No lymphangitis, No petechiae, No rashes, no synovitis; no clubbing or cyanosis   Data Reviewed: Basic Metabolic Panel:  Recent Labs Lab 09/13/15 1815 09/14/15 0848  NA 139 142  K 3.6 3.4*  CL 93* 94*  CO2 36* 38*  GLUCOSE 220* 120*  BUN 40* 38*  CREATININE 1.63* 1.44*  CALCIUM 8.8* 9.0   Liver Function Tests:  Recent Labs Lab 09/14/15 0848  AST 16  ALT 12*  ALKPHOS 58  BILITOT 1.6*  PROT 6.1*  ALBUMIN 2.7*   No results for input(s): LIPASE, AMYLASE in the last 168 hours. No results for input(s): AMMONIA in the last 168 hours. CBC:  Recent Labs Lab 09/13/15 1815 09/14/15 0848 09/14/15 1135  WBC 8.6 8.8 8.5  NEUTROABS  --  5.2  --   HGB 6.8* 8.3* 7.7*  HCT 23.7* 27.9* 25.9*  MCV 90.8 90.6 90.6  PLT 167 156 147*   Cardiac Enzymes:  Recent Labs Lab 09/14/15 0848 09/14/15 1135  TROPONINI 0.03 0.03   BNP: Invalid input(s): POCBNP CBG:  Recent Labs Lab 09/13/15 2259 09/14/15 0113 09/14/15 0550 09/14/15 1126  GLUCAP 192* 244* 148* 216*    Recent Results (from the past 240 hour(s))  Urine culture     Status: None (Preliminary result)   Collection Time: 09/13/15  8:01 PM  Result Value Ref Range Status   Specimen Description URINE, CLEAN CATCH  Final   Special Requests NONE  Final   Culture TOO YOUNG TO READ  Final   Report Status PENDING  Incomplete  MRSA PCR Screening     Status: None   Collection Time: 09/14/15  1:30 AM  Result Value Ref Range Status   MRSA by PCR NEGATIVE NEGATIVE Final    Comment:        The GeneXpert MRSA Assay (FDA approved for NASAL specimens only), is one component of a comprehensive MRSA colonization surveillance program. It is not intended to diagnose MRSA infection nor to guide or monitor treatment for MRSA infections.      Scheduled Meds: . atorvastatin  20 mg Oral QHS  . furosemide  80 mg Intravenous BID  . gabapentin  300 mg  Oral BID  . insulin aspart  0-15 Units Subcutaneous TID WC  . insulin aspart  0-5 Units Subcutaneous QHS  . insulin glargine  40 Units Subcutaneous QHS  . sodium chloride  3 mL Intravenous Q12H   Continuous Infusions:    Darrion Wyszynski, DO  [facility] Pager [phone]  If 7PM-7AM, please contact night-coverage [email or web address] Password [email or web address] 09/14/2015, 4:09 PM   LOS: 1 day

## 2015-09-14 NOTE — Progress Notes (Signed)
  Echocardiogram 2D Echocardiogram with Definity has been performed.  Mark SavoyCasey N Sable Roberson 09/14/2015, 10:55 AM

## 2015-09-15 ENCOUNTER — Inpatient Hospital Stay (HOSPITAL_COMMUNITY): Payer: Medicare Other

## 2015-09-15 DIAGNOSIS — R600 Localized edema: Secondary | ICD-10-CM

## 2015-09-15 LAB — PROTEIN / CREATININE RATIO, URINE
Creatinine, Urine: 84.54 mg/dL
PROTEIN CREATININE RATIO: 0.14 mg/mg{creat} (ref 0.00–0.15)
TOTAL PROTEIN, URINE: 12 mg/dL

## 2015-09-15 LAB — FERRITIN: Ferritin: 27 ng/mL (ref 24–336)

## 2015-09-15 LAB — TYPE AND SCREEN
ABO/RH(D): A NEG
Antibody Screen: NEGATIVE
UNIT DIVISION: 0
UNIT DIVISION: 0

## 2015-09-15 LAB — IRON AND TIBC
IRON: 29 ug/dL — AB (ref 45–182)
SATURATION RATIOS: 7 % — AB (ref 17.9–39.5)
TIBC: 395 ug/dL (ref 250–450)
UIBC: 366 ug/dL

## 2015-09-15 LAB — VITAMIN B12: Vitamin B-12: 491 pg/mL (ref 180–914)

## 2015-09-15 LAB — GLUCOSE, CAPILLARY
GLUCOSE-CAPILLARY: 149 mg/dL — AB (ref 65–99)
GLUCOSE-CAPILLARY: 224 mg/dL — AB (ref 65–99)
Glucose-Capillary: 168 mg/dL — ABNORMAL HIGH (ref 65–99)
Glucose-Capillary: 212 mg/dL — ABNORMAL HIGH (ref 65–99)

## 2015-09-15 LAB — BASIC METABOLIC PANEL
ANION GAP: 8 (ref 5–15)
BUN: 34 mg/dL — ABNORMAL HIGH (ref 6–20)
CALCIUM: 8.8 mg/dL — AB (ref 8.9–10.3)
CO2: 38 mmol/L — ABNORMAL HIGH (ref 22–32)
Chloride: 94 mmol/L — ABNORMAL LOW (ref 101–111)
Creatinine, Ser: 1.35 mg/dL — ABNORMAL HIGH (ref 0.61–1.24)
GFR, EST NON AFRICAN AMERICAN: 52 mL/min — AB (ref >60)
GLUCOSE: 205 mg/dL — AB (ref 65–99)
POTASSIUM: 3.7 mmol/L (ref 3.5–5.1)
SODIUM: 140 mmol/L (ref 135–145)

## 2015-09-15 MED ORDER — FUROSEMIDE 10 MG/ML IJ SOLN
80.0000 mg | Freq: Three times a day (TID) | INTRAMUSCULAR | Status: DC
  Administered 2015-09-15 – 2015-09-20 (×14): 80 mg via INTRAVENOUS
  Filled 2015-09-15 (×16): qty 8

## 2015-09-15 MED ORDER — RIVAROXABAN 15 MG PO TABS: 15.0000 mg | ORAL_TABLET | Freq: Every day | ORAL | Status: DC

## 2015-09-15 NOTE — Progress Notes (Signed)
Received call from [facility] - [facility] will not accept patient due to unsuccessful episodes of care, [facility] to [facility], patient stated, " I will get back to you on that." B Harperhandler RN,MHA,BSN [phone]

## 2015-09-15 NOTE — Care Management Note (Signed)
Case Management Note  Patient Details  Name: Macario GoldsMichael Roselle MRN: [id number] Date of Birth: [birth year]  Subjective/Objective:    Admitted with CHF              Action/Plan: Patient lives with friends, they do all of the cooking which he states that they do not use any salt but he does like [place] food. Patient has a flat somewhat sadden effect. Does not think that eating out is a problem and is upset about not diuresing a lot of fluid. Stated " Look my legs are swollen still and its been 2 days." Patient could benefit from a Disease Management Program for CHF. [facility]. Tim with [facility] made aware. He has a walker at home and states that he does not need anything else. Has private insurance with Medicare/ AARP with prescription drug coverage; pharmacy of choice is [facility] and he does not have any problem getting his medication. He has scales at home and stated that he weigh himself daily.    Expected Discharge Date:  09/18/15               Expected Discharge Plan:  Home w Home Health Services     Discharge planning Services  CM Consult Choice offered to:  Patient  HH Arranged:  RN, Disease Management, PT [facility]  CM will continue to follow for DCP  Cherrie DistanceChandler, Korver Graybeal L, RN, MHA,BSN [phone] 09/15/2015, 9:58 AM

## 2015-09-15 NOTE — Progress Notes (Signed)
Pt is on CPAP at this time tolerating it well.  

## 2015-09-15 NOTE — Progress Notes (Signed)
Pt  Refused and had pushed the CPAP to the door.  RT removed from room.  RT will continue to monitor. RN present

## 2015-09-15 NOTE — Progress Notes (Signed)
PROGRESS NOTE  Mark GoldsMichael Roberson [id number] DOB: 27-Oct-1945 DOA: 09/13/2015 PCP: Myrlene BrokerElizabeth A Crawford, MD  Brief History 69 year old male with a history of persistent atrial fibrillation, diastolic CHF, chronic respiratory failure on 2 L, CKD stage III, diabetes mellitus, hypertension presented with increasing lower extremity edema and epistaxis. The patient states that he has had intermittent epistaxis for many years, and he states that he has seen ENT whom has cauterized and exposed blood vessel in the past. The patient endorses compliance with his medications, but states that he has had dietary indiscretion eating carryout [place] food at least once weekly. He feels that he has gained over 20 pounds in the past 2 months. He denies any chest pain, nausea, vomiting, diarrhea, hemoptysis, hematochezia, melena, hematuria. In the emergency department, the patient was noted to have a hemoglobin 6.8 and serum creatinine of 1.63. Fecal occult blood test was negative. The patient was transfused 2 units PRBC and started on intravenous furosemide.  Assessment/Plan: Acute on chronic diastolic CHF -Related to the patient's dietary indiscretion OSA/OHS--had sleep study May 2016 confirming OSA -However, the patient has not arranged for his CPAP machine -Increase intravenous furosemide -daily weights -dry weight 308-310 -UO--3.1L in past 48 hours -increase lasix to 80 mg IV q 8 hrs -09/14/2015 echo--EF 65-70%, no WMA OSA/OHS -has not gotten around to getting his CPAP machine -tried CPAP--difficulty tolerating--not willing to retry Persistent atrial fibrillation -DCCV July 2016 -Reverted quickly back to atrial fibrillation -Rate controlled -Rivaroxaban on hold secondary to epistaxis-->restart in am 12/24 as no further epistaxis Chronic respiratory failure with hypoxia -Related to the patient's cor pulmonale and OSA  -Stable on 2 L  -Normally on 2 L at home  Epistaxis  -Improved   -May have contributed  to his blood loss anemia  Acute blood loss anemia/iron deficiency anemia -Check iron sat 7%, ferritin 27-->give a dose of feraheme -Check B12--491, RBC folate--pending  -transfused 2 units PRBCs -Baseline hemoglobin 8-9 CKD stage III -Baseline creatinine 1.6-1.9 -Serum creatinine actually improved with diuresis and continue to trend - CAD  -Status post PCI with DES in 1990s  -No chest pain presently  -EKG without concerning ischemic changes Diabetes mellitus type 2 with nephropathy  -06/26/2015 hemoglobin A1c 8.0  -Recheck hemoglobin A1c--pending -NovoLog sliding scale -Continue Lantus 40 units at bedtime  Lower extremity edema -Urine protein creatinine ratio--0.14 -Venous duplex--neg  Family Communication: Pt at beside Disposition Plan: Home 2-3 days      Procedures/Studies: Dg Chest 2 View  09/13/2015  CLINICAL DATA:  Shortness of breath for 1 day.  CHF. EXAM: CHEST  2 VIEW COMPARISON:  05/02/2015 FINDINGS: There is mild bilateral interstitial thickening. There is no focal parenchymal opacity. There is no pleural effusion or pneumothorax. There is stable cardiomegaly. The osseous structures are unremarkable. IMPRESSION: Stable cardiomegaly with mild pulmonary vascular congestion. Electronically Signed   By: Elige KoHetal  Patel   On: 09/13/2015 17:53         Subjective:  patient is breathing better. Denies any fevers, chills, chest pain, nausea, vomiting, diarrhea, abdominal pain, dysuria, hematuria. No rashes.   Objective: Filed Vitals:   09/15/15 0005 09/15/15 0358 09/15/15 0847 09/15/15 1127  BP:  105/37  136/49  Pulse: 64 55  58  Temp:  98.6 F (37 C)  98.6 F (37 C)  TempSrc:  Oral  Oral  Resp: 18 18  18   Height:      Weight:  149.279 kg (329 lb 1.6  oz)    SpO2: 100% 100% 97% 100%    Intake/Output Summary (Last 24 hours) at 09/15/15 1844 Last data filed at 09/15/15 1741  Gross per 24 hour  Intake    540 ml  Output    3700 ml  Net  -3160 ml   Weight change: -0.221 kg (-7.8 oz) Exam:   General:  Pt is alert, follows commands appropriately, not in acute distress  HEENT: No icterus, No thrush, No neck mass, [place]/AT  Cardiovascular: RRR, S1/S2, no rubs, no gallops  Respiratory: bibasilar crackles. No wheezing. Good air movement  Abdomen: Soft/+BS, non tender, non distended, no guarding; no hepatosplenomegaly   Extremities:  2+LE edema, No lymphangitis, No petechiae, No rashes, no synovitis  Data Reviewed: Basic Metabolic Panel:  Recent Labs Lab 09/13/15 1815 09/14/15 0848 09/15/15 0453  NA 139 142 140  K 3.6 3.4* 3.7  CL 93* 94* 94*  CO2 36* 38* 38*  GLUCOSE 220* 120* 205*  BUN 40* 38* 34*  CREATININE 1.63* 1.44* 1.35*  CALCIUM 8.8* 9.0 8.8*   Liver Function Tests:  Recent Labs Lab 09/14/15 0848  AST 16  ALT 12*  ALKPHOS 58  BILITOT 1.6*  PROT 6.1*  ALBUMIN 2.7*   No results for input(s): LIPASE, AMYLASE in the last 168 hours. No results for input(s): AMMONIA in the last 168 hours. CBC:  Recent Labs Lab 09/13/15 1815 09/14/15 0848 09/14/15 1135  WBC 8.6 8.8 8.5  NEUTROABS  --  5.2  --   HGB 6.8* 8.3* 7.7*  HCT 23.7* 27.9* 25.9*  MCV 90.8 90.6 90.6  PLT 167 156 147*   Cardiac Enzymes:  Recent Labs Lab 09/14/15 0848 09/14/15 1135  TROPONINI 0.03 0.03   BNP: Invalid input(s): POCBNP CBG:  Recent Labs Lab 09/14/15 2145 09/15/15 0601 09/15/15 0740 09/15/15 1114 09/15/15 1628  GLUCAP 214* 149* 168* 212* 224*    Recent Results (from the past 240 hour(s))  Urine culture     Status: None (Preliminary result)   Collection Time: 09/13/15  8:01 PM  Result Value Ref Range Status   Specimen Description URINE, CLEAN CATCH  Final   Special Requests NONE  Final   Culture >=100,000 COLONIES/mL ESCHERICHIA COLI  Final   Report Status PENDING  Incomplete  MRSA PCR Screening     Status: None   Collection Time: 09/14/15  1:30 AM  Result Value Ref Range Status    MRSA by PCR NEGATIVE NEGATIVE Final    Comment:        The GeneXpert MRSA Assay (FDA approved for NASAL specimens only), is one component of a comprehensive MRSA colonization surveillance program. It is not intended to diagnose MRSA infection nor to guide or monitor treatment for MRSA infections.      Scheduled Meds: . atorvastatin  20 mg Oral QHS  . furosemide  80 mg Intravenous Q8H  . gabapentin  300 mg Oral BID  . insulin aspart  0-15 Units Subcutaneous TID WC  . insulin aspart  0-5 Units Subcutaneous QHS  . insulin glargine  40 Units Subcutaneous QHS  . sodium chloride  3 mL Intravenous Q12H   Continuous Infusions:    Belanna Manring, DO  [facility] Pager [phone]  If 7PM-7AM, please contact night-coverage [email or web address] Password [facility] 09/15/2015, 6:44 PM   LOS: 2 days

## 2015-09-15 NOTE — Evaluation (Signed)
Physical Therapy Evaluation Patient Details Name: Mark Roberson MRN: [id number] DOB: 07/31/1946 Today's Date: 09/15/2015   History of Present Illness  69 y.o. male with h/o a fib, HTN, DM, CHF, OSA, asthma, CKD-III, peripheral neuropathy, O2 dependence admitted with acute on chronic CHF.  Clinical Impression  Pt admitted with above diagnosis. Pt currently with functional limitations due to the deficits listed below (see PT Problem List). Pt ambulated 100' with RW and 2L O2, SaO2 97%, no loss of balance. MIn/guard for safety. Plan is to DC home where he has 24* assist.  Pt will benefit from skilled PT to increase their independence and safety with mobility to allow discharge to the venue listed below.       Follow Up Recommendations Home health PT;Supervision for mobility/OOB    Equipment Recommendations  None recommended by PT    Recommendations for Other Services       Precautions / Restrictions Precautions Precautions: Fall Precaution Comments: 2 falls in past year, BLEs buckled; O2 dependent at baseline Restrictions Weight Bearing Restrictions: No      Mobility  Bed Mobility Overal bed mobility: Modified Independent             General bed mobility comments: with rail  Transfers Overall transfer level: Needs assistance Equipment used: Rolling walker (2 wheeled) Transfers: Sit to/from Stand Sit to Stand: Min guard         General transfer comment: min/guard for safety, uncontrolled descent to recliner, VCs hand placement  Ambulation/Gait Ambulation/Gait assistance: Supervision Ambulation Distance (Feet): 100 Feet Assistive device: Rolling walker (2 wheeled) Gait Pattern/deviations: Trunk flexed;Step-through pattern   Gait velocity interpretation: at or above normal speed for age/gender General Gait Details: ambulated with 2L O2 Mark Roberson, SaO2 97% with walking, 1/4 dyspnea, no LOB  Stairs            Wheelchair Mobility    Modified Rankin (Stroke  Patients Only)       Balance Overall balance assessment: Modified Independent;History of Falls                                           Pertinent Vitals/Pain Pain Assessment: No/denies pain    Home Living Family/patient expects to be discharged to:: Private residence Living Arrangements: Non-relatives/Friends Available Help at Discharge: Friend(s);Available 24 hours/day Type of Home: House Home Access: Stairs to enter Entrance Stairs-Rails: Right Entrance Stairs-Number of Steps: 3 Home Layout: One level Home Equipment: Walker - 2 wheels;Bedside commode;Tub bench;Wheelchair - Building surveyormanual;Adaptive equipment;Other (comment) (oxygen)      Prior Function Level of Independence: Independent with assistive device(s)         Comments: pt reports he's independent with ADLs and uses RW for ambulation     Hand Dominance        Extremity/Trunk Assessment   Upper Extremity Assessment: Defer to OT evaluation           Lower Extremity Assessment: RLE deficits/detail;LLE deficits/detail (B feet numb to light touch, this is baseline per pt, pitting edema noted B feet)      Cervical / Trunk Assessment: Kyphotic  Communication   Communication: No difficulties  Cognition Arousal/Alertness: Awake/alert Behavior During Therapy: WFL for tasks assessed/performed Overall Cognitive Status: Within Functional Limits for tasks assessed                      General Comments  Exercises General Exercises - Lower Extremity Ankle Circles/Pumps: AROM;Both;20 reps;Supine      Assessment/Plan    PT Assessment Patient needs continued PT services  PT Diagnosis Generalized weakness   PT Problem List Decreased activity tolerance;Decreased mobility  PT Treatment Interventions Gait training;Functional mobility training;Therapeutic exercise;Patient/family education   PT Goals (Current goals can be found in the Care Plan section) Acute Rehab PT Goals Patient  Stated Goal: pt likes to watch his big screen tv PT Goal Formulation: With patient Time For Goal Achievement: 09/29/15 Potential to Achieve Goals: Good    Frequency Min 3X/week   Barriers to discharge        Co-evaluation               End of Session Equipment Utilized During Treatment: Gait belt;Oxygen Activity Tolerance: Patient tolerated treatment well;No increased pain Patient left: in chair;with call bell/phone within reach Nurse Communication: Mobility status         Time: 1610-9604 PT Time Calculation (min) (ACUTE ONLY): 29 min   Charges:   PT Evaluation $Initial PT Evaluation Tier I: 1 Procedure PT Treatments $Gait Training: 8-22 mins   PT G Codes:        Mark Roberson 09/15/2015, 8:53 AM [phone]

## 2015-09-15 NOTE — Progress Notes (Signed)
OT Cancellation Note and Discharge  Patient Details Name: Mark GoldsMichael Roberson MRN: [id number] DOB: Sep 13, 1946   Cancelled Treatment:    Reason Eval/Treat Not Completed: OT screened, no needs identified, will sign off. In to speak with pt about role of OT and reports that he does not foresee any issues with basic ADLs once home. Noted he was min guard A/S with PT earlier and O2 stayed up. He reports there is someone with him 24/7 and they can A prn if needed. He also reports that he has all the DME/AE he needs at home.   Evette GeorgesLeonard, Toshiko Kemler Eva [phone] 09/15/2015, 12:38 PM

## 2015-09-15 NOTE — Progress Notes (Signed)
*  PRELIMINARY RESULTS* Vascular Ultrasound Lower extremity venous duplex has been completed.  Preliminary findings: No evidence of DVT or baker's cyst.   Farrel DemarkJill Eunice, RDMS, RVT  09/15/2015, 3:02 PM

## 2015-09-16 LAB — BASIC METABOLIC PANEL
Anion gap: 7 (ref 5–15)
BUN: 33 mg/dL — ABNORMAL HIGH (ref 6–20)
CALCIUM: 8.8 mg/dL — AB (ref 8.9–10.3)
CHLORIDE: 93 mmol/L — AB (ref 101–111)
CO2: 39 mmol/L — AB (ref 22–32)
CREATININE: 1.44 mg/dL — AB (ref 0.61–1.24)
GFR calc Af Amer: 56 mL/min — ABNORMAL LOW (ref >60)
GFR calc non Af Amer: 48 mL/min — ABNORMAL LOW (ref >60)
GLUCOSE: 244 mg/dL — AB (ref 65–99)
Potassium: 4 mmol/L (ref 3.5–5.1)
Sodium: 139 mmol/L (ref 135–145)

## 2015-09-16 LAB — URINE CULTURE: Culture: >=100000

## 2015-09-16 LAB — GLUCOSE, CAPILLARY
GLUCOSE-CAPILLARY: 183 mg/dL — AB (ref 65–99)
GLUCOSE-CAPILLARY: 218 mg/dL — AB (ref 65–99)
GLUCOSE-CAPILLARY: 314 mg/dL — AB (ref 65–99)
Glucose-Capillary: 205 mg/dL — ABNORMAL HIGH (ref 65–99)
Glucose-Capillary: 280 mg/dL — ABNORMAL HIGH (ref 65–99)
Glucose-Capillary: 283 mg/dL — ABNORMAL HIGH (ref 65–99)

## 2015-09-16 LAB — HEMOGLOBIN A1C
HEMOGLOBIN A1C: 6.5 % — AB (ref 4.8–5.6)
Mean Plasma Glucose: 140 mg/dL

## 2015-09-16 MED ORDER — RIVAROXABAN 20 MG PO TABS
20.0000 mg | ORAL_TABLET | Freq: Every day | ORAL | Status: DC
  Administered 2015-09-16 – 2015-09-21 (×6): 20 mg via ORAL
  Filled 2015-09-16 (×6): qty 1

## 2015-09-16 MED ORDER — INSULIN GLARGINE 100 UNIT/ML ~~LOC~~ SOLN
43.0000 [IU] | Freq: Every day | SUBCUTANEOUS | Status: DC
  Administered 2015-09-16 – 2015-09-17 (×2): 43 [IU] via SUBCUTANEOUS
  Filled 2015-09-16 (×3): qty 0.43

## 2015-09-16 MED ORDER — CIPROFLOXACIN HCL 500 MG PO TABS
500.0000 mg | ORAL_TABLET | Freq: Two times a day (BID) | ORAL | Status: DC
  Administered 2015-09-16 – 2015-09-22 (×12): 500 mg via ORAL
  Filled 2015-09-16 (×12): qty 1

## 2015-09-16 MED ORDER — ALBUTEROL SULFATE HFA 108 (90 BASE) MCG/ACT IN AERS: 1.0000 | INHALATION_SPRAY | Freq: Four times a day (QID) | RESPIRATORY_TRACT | Status: DC | PRN

## 2015-09-16 NOTE — Progress Notes (Signed)
PROGRESS NOTE  Mark GoldsMichael Roberson WJX:[id number] DOB: 02-18-1946 DOA: 09/13/2015 PCP: Myrlene BrokerElizabeth A Crawford, MD  HPI/Recap of past 6124 hours: 69 year old male with a history of persistent atrial fibrillation, diastolic CHF, chronic respiratory failure on 2 L, CKD stage III, diabetes mellitus, hypertension presented with increasing lower extremity edema and epistaxis. The patient states that he has had intermittent epistaxis for many years, and he states that he has seen ENT whom has cauterized and exposed blood vessel in the past. The patient endorses compliance with his medications, but states that he has had dietary indiscretion eating carryout [place] food at least once weekly. He feels that he has gained over 20 pounds in the past 2 months. He denies any chest pain, nausea, vomiting, diarrhea, hemoptysis, hematochezia, melena, hematuria. In the emergency department, the patient was noted to have a hemoglobin 6.8 and serum creatinine of 1.63. Fecal occult blood test was negative. The patient was transfused 2 units PRBC and started on intravenous furosemide.  Patient has diuresed now almost 4 L and is down 4 pounds. He himself has no complaints. Feels like breathing is doing okay. No chest pain.  Assessment/Plan: Active Problems:   HTN (hypertension)   DM (diabetes mellitus), type 2 with renal complications (HCC)   Epistaxis: Stabilized   OSA (obstructive sleep apnea)   Anticoagulant long-term use   Atrial fibrillation with slow ventricular response [facility](HCC): Chads 2 vas score of 6. Initially xarelto held secondary to epistaxis, will restart now.   CKD (chronic kidney disease), stage III: Stable    Chronic respiratory failure on home oxygen On home oxygen: At baseline    Acute on chronic diastolic CHF (congestive heart failure) (HCC): Continue aggressive diuresis. Patient still has approximately 16 pounds to go. Likely secondary to dietary indiscretion and in the setting of obstructive sleep apnea.  Patient is resistant to using this      Acute blood loss anemia: Status post 2 units packed red blood cell transfusion   Code Status: DO NOT RESUSCITATE   Family Communication: No family present   Disposition Plan: Likely here for a few more days    Consultants:  None   Procedures:  Status post 2 units packed red blood cells transfused 12/22   Antibiotics:  None    Objective: BP 110/48 mmHg  Pulse 55  Temp(Src) 97.9 F (36.6 C) (Oral)  Resp 18  Ht 6' (1.829 m)  Wt 148.099 kg (326 lb 8 oz)  BMI 44.27 kg/m2  SpO2 100%  Intake/Output Summary (Last 24 hours) at 09/16/15 1456 Last data filed at 09/16/15 1300  Gross per 24 hour  Intake    820 ml  Output   1350 ml  Net   -530 ml   Filed Weights   09/14/15 0513 09/15/15 0358 09/16/15 0454  Weight: 149.324 kg (329 lb 3.2 oz) 149.279 kg (329 lb 1.6 oz) 148.099 kg (326 lb 8 oz)    Exam:   General:  Alert and oriented 3, flattened affect   Cardiovascular: Irregular rhythm, rate controlled   Respiratory: Decreased breath sounds throughout secondary to body habitus   Abdomen: Soft, obese, nontender, positive bowel sounds   Musculoskeletal: 1+ pitting edema bilaterally    Data Reviewed: Basic Metabolic Panel:  Recent Labs Lab 09/13/15 1815 09/14/15 0848 09/15/15 0453 09/16/15 0317  NA 139 142 140 139  K 3.6 3.4* 3.7 4.0  CL 93* 94* 94* 93*  CO2 36* 38* 38* 39*  GLUCOSE 220* 120* 205* 244*  BUN 40* 38*  34* 33*  CREATININE 1.63* 1.44* 1.35* 1.44*  CALCIUM 8.8* 9.0 8.8* 8.8*   Liver Function Tests:  Recent Labs Lab 09/14/15 0848  AST 16  ALT 12*  ALKPHOS 58  BILITOT 1.6*  PROT 6.1*  ALBUMIN 2.7*   No results for input(s): LIPASE, AMYLASE in the last 168 hours. No results for input(s): AMMONIA in the last 168 hours. CBC:  Recent Labs Lab 09/13/15 1815 09/14/15 0848 09/14/15 1135  WBC 8.6 8.8 8.5  NEUTROABS  --  5.2  --   HGB 6.8* 8.3* 7.7*  HCT 23.7* 27.9* 25.9*  MCV 90.8 90.6 90.6   PLT 167 156 147*   Cardiac Enzymes:    Recent Labs Lab 09/14/15 0848 09/14/15 1135  TROPONINI 0.03 0.03   BNP (last 3 results)  Recent Labs  05/03/15 0516 05/25/15 1217 09/13/15 1815  BNP 284.3* 214.5* 280.4*    ProBNP (last 3 results) No results for input(s): PROBNP in the last 8760 hours.  CBG:  Recent Labs Lab 09/15/15 1114 09/15/15 1628 09/15/15 2131 09/16/15 0632 09/16/15 1134  GLUCAP 212* 224* 205* 183* 218*    Recent Results (from the past 240 hour(s))  Urine culture     Status: None   Collection Time: 09/13/15  8:01 PM  Result Value Ref Range Status   Specimen Description URINE, CLEAN CATCH  Final   Special Requests NONE  Final   Culture >=100,000 COLONIES/mL ESCHERICHIA COLI  Final   Report Status 09/16/2015 FINAL  Final   Organism ID, Bacteria ESCHERICHIA COLI  Final      Susceptibility   Escherichia coli - MIC*    AMPICILLIN <=2 SENSITIVE Sensitive     CEFAZOLIN <=4 SENSITIVE Sensitive     CEFTRIAXONE <=1 SENSITIVE Sensitive     CIPROFLOXACIN <=0.25 SENSITIVE Sensitive     GENTAMICIN <=1 SENSITIVE Sensitive     IMIPENEM <=0.25 SENSITIVE Sensitive     NITROFURANTOIN <=16 SENSITIVE Sensitive     TRIMETH/SULFA <=20 SENSITIVE Sensitive     AMPICILLIN/SULBACTAM <=2 SENSITIVE Sensitive     PIP/TAZO <=4 SENSITIVE Sensitive     * >=100,000 COLONIES/mL ESCHERICHIA COLI  MRSA PCR Screening     Status: None   Collection Time: 09/14/15  1:30 AM  Result Value Ref Range Status   MRSA by PCR NEGATIVE NEGATIVE Final    Comment:        The GeneXpert MRSA Assay (FDA approved for NASAL specimens only), is one component of a comprehensive MRSA colonization surveillance program. It is not intended to diagnose MRSA infection nor to guide or monitor treatment for MRSA infections.      Studies: No results found.  Scheduled Meds: . atorvastatin  20 mg Oral QHS  . furosemide  80 mg Intravenous Q8H  . gabapentin  300 mg Oral BID  . insulin aspart   0-15 Units Subcutaneous TID WC  . insulin aspart  0-5 Units Subcutaneous QHS  . insulin glargine  43 Units Subcutaneous QHS  . rivaroxaban  15 mg Oral Q supper  . sodium chloride  3 mL Intravenous Q12H    Continuous Infusions:    Time spent: 15 minutes   Hollice Espy  [facility] Pager [phone] . If 7PM-7AM, please contact night-coverage at [email or web address], password [facility] 09/16/2015, 2:56 PM  LOS: 3 days

## 2015-09-16 NOTE — Progress Notes (Signed)
Pt refusing bed alarm, pt educated to call RN if needing to get out of bed/chair

## 2015-09-16 NOTE — Progress Notes (Signed)
Pt refuses cpap at this time.  Rt will continue to monitor. 

## 2015-09-17 LAB — BASIC METABOLIC PANEL
Anion gap: 8 (ref 5–15)
BUN: 34 mg/dL — ABNORMAL HIGH (ref 6–20)
CHLORIDE: 91 mmol/L — AB (ref 101–111)
CO2: 41 mmol/L — AB (ref 22–32)
CREATININE: 1.45 mg/dL — AB (ref 0.61–1.24)
Calcium: 9 mg/dL (ref 8.9–10.3)
GFR calc non Af Amer: 48 mL/min — ABNORMAL LOW (ref >60)
GFR, EST AFRICAN AMERICAN: 55 mL/min — AB (ref >60)
Glucose, Bld: 196 mg/dL — ABNORMAL HIGH (ref 65–99)
Potassium: 4.3 mmol/L (ref 3.5–5.1)
Sodium: 140 mmol/L (ref 135–145)

## 2015-09-17 LAB — GLUCOSE, CAPILLARY
GLUCOSE-CAPILLARY: 170 mg/dL — AB (ref 65–99)
GLUCOSE-CAPILLARY: 243 mg/dL — AB (ref 65–99)
Glucose-Capillary: 142 mg/dL — ABNORMAL HIGH (ref 65–99)
Glucose-Capillary: 305 mg/dL — ABNORMAL HIGH (ref 65–99)

## 2015-09-17 LAB — CBC
HEMATOCRIT: 26.4 % — AB (ref 39.0–52.0)
HEMOGLOBIN: 7.7 g/dL — AB (ref 13.0–17.0)
MCH: 26.9 pg (ref 26.0–34.0)
MCHC: 29.2 g/dL — AB (ref 30.0–36.0)
MCV: 92.3 fL (ref 78.0–100.0)
Platelets: 160 10*3/uL (ref 150–400)
RBC: 2.86 MIL/uL — ABNORMAL LOW (ref 4.22–5.81)
RDW: 16.5 % — ABNORMAL HIGH (ref 11.5–15.5)
WBC: 7.4 10*3/uL (ref 4.0–10.5)

## 2015-09-17 NOTE — Progress Notes (Signed)
PROGRESS NOTE  Mark Roberson WUJ:[id number] DOB: [birth year] DOA: 09/13/2015 PCP: [name], MD  HPI/Recap of past 12 hours: 69 year old male with a history of persistent atrial fibrillation, diastolic CHF, chronic respiratory failure on 2 L, CKD stage III, diabetes mellitus, hypertension presented with increasing lower extremity edema and epistaxis. The patient states that he has had intermittent epistaxis for many years, and he states that he has seen ENT whom has cauterized and exposed blood vessel in the past. The patient endorses compliance with his medications, but states that he has had dietary indiscretion eating carryout [place] food at least once weekly. He feels that he has gained over 20 pounds in the past 2 months. He denies any chest pain, nausea, vomiting, diarrhea, hemoptysis, hematochezia, melena, hematuria. In the emergency department, the patient was noted to have a hemoglobin 6.8 and serum creatinine of 1.63. Fecal occult blood test was negative. The patient was transfused 2 units PRBC and started on intravenous furosemide.  Patient has diuresed now almost 5 lit and is down 6 pounds. He himself has no complaints. Feels like breathing is doing okay. No chest pain.  He is requesting a condom cath  Assessment/Plan: Active Problems:   HTN (hypertension): Blood pressure has been stable   DM (diabetes mellitus), type 2 with renal complications (HCC): Lantus titrated up on 12/24. CBGs improved   Epistaxis: Stabilized   OSA (obstructive sleep apnea)   Anticoagulant long-term use   Atrial fibrillation with slow ventricular response [facility]): Chads 2 vas score of 6. Initially xarelto held secondary to epistaxis, will restart now.   CKD (chronic kidney disease), stage III: Stable    Chronic respiratory failure on home oxygen On home oxygen: At baseline    Acute on chronic diastolic CHF (congestive heart failure) (HCC): Continue aggressive diuresis. Patient still has  approximately 15 or so pounds to go with dry weight at 308. Likely secondary to dietary indiscretion and in the setting of obstructive sleep apnea. Patient is resistant to using this      Acute blood loss anemia: Status post 2 units packed red blood cell transfusion, hemoglobin has remained stable   Code Status: DO NOT RESUSCITATE   Family Communication: No family present   Disposition Plan: Likely here for a few more days until fully diuresed   Consultants:  None   Procedures:  Status post 2 units packed red blood cells transfused 12/22   Antibiotics:  None    Objective: BP 120/35 mmHg  Pulse 54  Temp(Src) 97.7 F (36.5 C) (Oral)  Resp 17  Ht 6' (1.829 m)  Wt 146.829 kg (323 lb 11.2 oz)  BMI 43.89 kg/m2  SpO2 100%  Intake/Output Summary (Last 24 hours) at 09/17/15 1508 Last data filed at 09/17/15 1500  Gross per 24 hour  Intake    720 ml  Output   2725 ml  Net  -2005 ml   Filed Weights   09/15/15 0358 09/16/15 0454 09/17/15 0634  Weight: 149.279 kg (329 lb 1.6 oz) 148.099 kg (326 lb 8 oz) 146.829 kg (323 lb 11.2 oz)    Exam: Unchanged from previous  General:  Alert and oriented 3, flattened affect   Cardiovascular: Irregular rhythm, rate controlled   Respiratory: Decreased breath sounds throughout secondary to body habitus   Abdomen: Soft, obese, nontender, positive bowel sounds   Musculoskeletal: 1+ pitting edema bilaterally    Data Reviewed: Basic Metabolic Panel:  Recent Labs Lab 09/13/15 1815 09/14/15 0848 09/15/15 0453 09/16/15 0317 09/17/15  0354  NA 139 142 140 139 140  K 3.6 3.4* 3.7 4.0 4.3  CL 93* 94* 94* 93* 91*  CO2 36* 38* 38* 39* 41*  GLUCOSE 220* 120* 205* 244* 196*  BUN 40* 38* 34* 33* 34*  CREATININE 1.63* 1.44* 1.35* 1.44* 1.45*  CALCIUM 8.8* 9.0 8.8* 8.8* 9.0   Liver Function Tests:  Recent Labs Lab 09/14/15 0848  AST 16  ALT 12*  ALKPHOS 58  BILITOT 1.6*  PROT 6.1*  ALBUMIN 2.7*   No results for input(s):  LIPASE, AMYLASE in the last 168 hours. No results for input(s): AMMONIA in the last 168 hours. CBC:  Recent Labs Lab 09/13/15 1815 09/14/15 0848 09/14/15 1135 09/17/15 0354  WBC 8.6 8.8 8.5 7.4  NEUTROABS  --  5.2  --   --   HGB 6.8* 8.3* 7.7* 7.7*  HCT 23.7* 27.9* 25.9* 26.4*  MCV 90.8 90.6 90.6 92.3  PLT 167 156 147* 160   Cardiac Enzymes:    Recent Labs Lab 09/14/15 0848 09/14/15 1135  TROPONINI 0.03 0.03   BNP (last 3 results)  Recent Labs  05/03/15 0516 05/25/15 1217 09/13/15 1815  BNP 284.3* 214.5* 280.4*    ProBNP (last 3 results) No results for input(s): PROBNP in the last 8760 hours.  CBG:  Recent Labs Lab 09/16/15 1644 09/16/15 2051 09/16/15 2235 09/17/15 0553 09/17/15 1107  GLUCAP 314* 280* 283* 142* 170*    Recent Results (from the past 240 hour(s))  Urine culture     Status: None   Collection Time: 09/13/15  8:01 PM  Result Value Ref Range Status   Specimen Description URINE, CLEAN CATCH  Final   Special Requests NONE  Final   Culture >=100,000 COLONIES/mL ESCHERICHIA COLI  Final   Report Status 09/16/2015 FINAL  Final   Organism ID, Bacteria ESCHERICHIA COLI  Final      Susceptibility   Escherichia coli - MIC*    AMPICILLIN <=2 SENSITIVE Sensitive     CEFAZOLIN <=4 SENSITIVE Sensitive     CEFTRIAXONE <=1 SENSITIVE Sensitive     CIPROFLOXACIN <=0.25 SENSITIVE Sensitive     GENTAMICIN <=1 SENSITIVE Sensitive     IMIPENEM <=0.25 SENSITIVE Sensitive     NITROFURANTOIN <=16 SENSITIVE Sensitive     TRIMETH/SULFA <=20 SENSITIVE Sensitive     AMPICILLIN/SULBACTAM <=2 SENSITIVE Sensitive     PIP/TAZO <=4 SENSITIVE Sensitive     * >=100,000 COLONIES/mL ESCHERICHIA COLI  MRSA PCR Screening     Status: None   Collection Time: 09/14/15  1:30 AM  Result Value Ref Range Status   MRSA by PCR NEGATIVE NEGATIVE Final    Comment:        The GeneXpert MRSA Assay (FDA approved for NASAL specimens only), is one component of a comprehensive MRSA  colonization surveillance program. It is not intended to diagnose MRSA infection nor to guide or monitor treatment for MRSA infections.      Studies: No results found.  Scheduled Meds: . atorvastatin  20 mg Oral QHS  . ciprofloxacin  500 mg Oral BID  . furosemide  80 mg Intravenous Q8H  . gabapentin  300 mg Oral BID  . insulin aspart  0-15 Units Subcutaneous TID WC  . insulin aspart  0-5 Units Subcutaneous QHS  . insulin glargine  43 Units Subcutaneous QHS  . rivaroxaban  20 mg Oral Q supper  . sodium chloride  3 mL Intravenous Q12H    Continuous Infusions:    Time spent: 15  minutes   Hollice Espy  [facility] Pager [phone] . If 7PM-7AM, please contact night-coverage at [email or web address], password [facility] 09/17/2015, 3:08 PM  LOS: 4 days

## 2015-09-18 DIAGNOSIS — E1122 Type 2 diabetes mellitus with diabetic chronic kidney disease: Secondary | ICD-10-CM

## 2015-09-18 DIAGNOSIS — B9620 Unspecified Escherichia coli [E. coli] as the cause of diseases classified elsewhere: Secondary | ICD-10-CM | POA: Diagnosis present

## 2015-09-18 DIAGNOSIS — N390 Urinary tract infection, site not specified: Secondary | ICD-10-CM

## 2015-09-18 LAB — BASIC METABOLIC PANEL
Anion gap: 9 (ref 5–15)
BUN: 35 mg/dL — ABNORMAL HIGH (ref 6–20)
CALCIUM: 9 mg/dL (ref 8.9–10.3)
CHLORIDE: 88 mmol/L — AB (ref 101–111)
CO2: 43 mmol/L — AB (ref 22–32)
CREATININE: 1.59 mg/dL — AB (ref 0.61–1.24)
GFR calc Af Amer: 49 mL/min — ABNORMAL LOW (ref >60)
GFR calc non Af Amer: 43 mL/min — ABNORMAL LOW (ref >60)
GLUCOSE: 274 mg/dL — AB (ref 65–99)
Potassium: 4 mmol/L (ref 3.5–5.1)
Sodium: 140 mmol/L (ref 135–145)

## 2015-09-18 LAB — GLUCOSE, CAPILLARY
GLUCOSE-CAPILLARY: 221 mg/dL — AB (ref 65–99)
GLUCOSE-CAPILLARY: 239 mg/dL — AB (ref 65–99)
Glucose-Capillary: 237 mg/dL — ABNORMAL HIGH (ref 65–99)
Glucose-Capillary: 322 mg/dL — ABNORMAL HIGH (ref 65–99)
Glucose-Capillary: 256 mg/dL — ABNORMAL HIGH (ref 65–99)

## 2015-09-18 LAB — CBC
HEMATOCRIT: 30.1 % — AB (ref 39.0–52.0)
HEMOGLOBIN: 8.8 g/dL — AB (ref 13.0–17.0)
MCH: 27.2 pg (ref 26.0–34.0)
MCHC: 29.2 g/dL — AB (ref 30.0–36.0)
MCV: 92.9 fL (ref 78.0–100.0)
Platelets: 169 10*3/uL (ref 150–400)
RBC: 3.24 MIL/uL — ABNORMAL LOW (ref 4.22–5.81)
RDW: 16.7 % — AB (ref 11.5–15.5)
WBC: 8.4 10*3/uL (ref 4.0–10.5)

## 2015-09-18 MED ORDER — INSULIN ASPART 100 UNIT/ML ~~LOC~~ SOLN
0.0000 [IU] | Freq: Three times a day (TID) | SUBCUTANEOUS | Status: DC
  Administered 2015-09-18: 7 [IU] via SUBCUTANEOUS
  Administered 2015-09-18: 15 [IU] via SUBCUTANEOUS
  Administered 2015-09-19: 4 [IU] via SUBCUTANEOUS
  Administered 2015-09-19: 7 [IU] via SUBCUTANEOUS
  Administered 2015-09-19: 15 [IU] via SUBCUTANEOUS
  Administered 2015-09-20: 3 [IU] via SUBCUTANEOUS
  Administered 2015-09-20 – 2015-09-22 (×6): 4 [IU] via SUBCUTANEOUS
  Administered 2015-09-22: 3 [IU] via SUBCUTANEOUS

## 2015-09-18 MED ORDER — POTASSIUM CHLORIDE CRYS ER 20 MEQ PO TBCR
20.0000 meq | EXTENDED_RELEASE_TABLET | ORAL | Status: DC
  Administered 2015-09-18: 20 meq via ORAL
  Filled 2015-09-18 (×4): qty 1

## 2015-09-18 MED ORDER — INSULIN ASPART 100 UNIT/ML ~~LOC~~ SOLN: 0.0000 [IU] | Freq: Every day | SUBCUTANEOUS | Status: DC

## 2015-09-18 MED ORDER — INSULIN GLARGINE 100 UNIT/ML ~~LOC~~ SOLN
46.0000 [IU] | Freq: Every day | SUBCUTANEOUS | Status: DC
  Administered 2015-09-18: 46 [IU] via SUBCUTANEOUS
  Filled 2015-09-18 (×2): qty 0.46

## 2015-09-18 MED ORDER — METOLAZONE 2.5 MG PO TABS
2.5000 mg | ORAL_TABLET | ORAL | Status: DC
  Administered 2015-09-18: 2.5 mg via ORAL
  Filled 2015-09-18 (×4): qty 1

## 2015-09-18 NOTE — Progress Notes (Signed)
Patient refuses CPAP use.  Verbalized understanding that he could use his call bell if he changed his mind.

## 2015-09-18 NOTE — Progress Notes (Signed)
OT NOTE  OT order placed 09/16/15 however OT met with patient and screened on 09/15/15. No acute OT services needed at this time. OT sign off acutely.    Jeri Modena   OTR/L Pager: [phone] Office: [phone] .

## 2015-09-18 NOTE — Progress Notes (Signed)
[facility] PROGRESS NOTE  Mark Roberson [id number]:[id number] DOB: 01/04/1946 DOA: 09/13/2015 PCP: [name], MD  Brief interval history  69 year old male with a history of persistent atrial fibrillation, diastolic CHF, chronic respiratory failure on 2 L, CKD stage III, diabetes mellitus, hypertension presented with increasing lower extremity edema and epistaxis. The patient states that he has had intermittent epistaxis for many years, and he states that he has seen ENT whom has cauterized and exposed blood vessel in the past. The patient endorses compliance with his medications, but states that he has had dietary indiscretion eating carryout [place] food at least once weekly. He feels that he has gained over 20 pounds in the past 2 months. He denies any chest pain, nausea, vomiting, diarrhea, hemoptysis, hematochezia, melena, hematuria. In the emergency department, the patient was noted to have a hemoglobin 6.8 and serum creatinine of 1.63. Fecal occult blood test was negative. The patient was transfused 2 units PRBC and started on intravenous furosemide. Weekly metalazone resumed.  Patient has diuresed 7.8 L.     Assessment/Plan: #1 acute on chronic diastolic heart failure Likely secondary to dietary indiscretions in the setting of obstructive sleep apnea. Patient resistant to using C Pap machine. Patient is -3.715 L over the past 24 hours and -7.84 L during this hospitalization. Patient still with some bibasilar crackles or lower extremity edema. Current weight is 316 pounds. Dry weight is approximately 308 pounds. Continue IV diuretics. Will place patient back on his weekly dose of metabolism. Continue statin. Follow.  #2 Escherichia coli UTI Continue oral Cipro and treat for a total of 7 days.  #3 hypertension Stable.  #4  well-controlled type 2 diabetes mellitus Hemoglobin A1c 6.5 on 09/15/2015. CBGs have ranged from 221-305. Increase Lantus to 46 units daily. Change  sliding scale insulin to resistant scale. Follow.  #5 epistaxis Stable. No further nasal bleeding.  #6 atrial fibrillation CHA2DS2VASC score is 6. Patient currently rate controlled now. Epistaxis seem to have resolved. Xarelto has been resumed. Follow.  #7 chronic kidney disease stage III Stable.  #8 obstructive sleep apnea  #9 acute blood loss anemia Status post 2 units packed red blood cells. H&H stable.  #10 prophylaxis Xarelto for DVT prophylaxis.   Code Status: DNR Family Communication: Updated patient. No family at bedside.  Disposition Plan: Home when adequately diuresed and at dry weight of 308 pounds.   Consultants:  None  Procedures:  Chest x-ray 09/13/2015  Lower extremity Dopplers 09/15/2015   2 units packed red blood cells 09/14/2015  Antibiotics:  Oral ciprofloxacin 09/16/2015  HPI/Subjective: Patient denies any further nasal bleeding. Patient denies any shortness of breath. Patient denies any chest pain.  Objective: Filed Vitals:   09/17/15 1951 09/18/15 0427  BP: 116/38 137/57  Pulse: 55 67  Temp: 97.9 F (36.6 C) 97.8 F (36.6 C)  Resp: 18 20    Intake/Output Summary (Last 24 hours) at 09/18/15 1036 Last data filed at 09/18/15 0900  Gross per 24 hour  Intake   1500 ml  Output   4425 ml  Net  -2925 ml   Filed Weights   09/16/15 0454 09/17/15 0634 09/18/15 0427  Weight: 148.099 kg (326 lb 8 oz) 146.829 kg (323 lb 11.2 oz) 143.745 kg (316 lb 14.4 oz)    Exam:   General:  NAD  Cardiovascular: Irregularly irregular  Respiratory: Bibasilar crackles  Abdomen: Soft/NT/ND/+BS  Musculoskeletal: No c/c/. 2 + BLE edema  Data Reviewed: Basic Metabolic Panel:  Recent Labs Lab 09/14/15 0848  09/15/15 0453 09/16/15 0317 09/17/15 0354 09/18/15 0538  NA 142 140 139 140 140  K 3.4* 3.7 4.0 4.3 4.0  CL 94* 94* 93* 91* 88*  CO2 38* 38* 39* 41* 43*  GLUCOSE 120* 205* 244* 196* 274*  BUN 38* 34* 33* 34* 35*  CREATININE 1.44*  1.35* 1.44* 1.45* 1.59*  CALCIUM 9.0 8.8* 8.8* 9.0 9.0   Liver Function Tests:  Recent Labs Lab 09/14/15 0848  AST 16  ALT 12*  ALKPHOS 58  BILITOT 1.6*  PROT 6.1*  ALBUMIN 2.7*   No results for input(s): LIPASE, AMYLASE in the last 168 hours. No results for input(s): AMMONIA in the last 168 hours. CBC:  Recent Labs Lab 09/13/15 1815 09/14/15 0848 09/14/15 1135 09/17/15 0354 09/18/15 0840  WBC 8.6 8.8 8.5 7.4 8.4  NEUTROABS  --  5.2  --   --   --   HGB 6.8* 8.3* 7.7* 7.7* 8.8*  HCT 23.7* 27.9* 25.9* 26.4* 30.1*  MCV 90.8 90.6 90.6 92.3 92.9  PLT 167 156 147* 160 169   Cardiac Enzymes:  Recent Labs Lab 09/14/15 0848 09/14/15 1135  TROPONINI 0.03 0.03   BNP (last 3 results)  Recent Labs  05/03/15 0516 05/25/15 1217 09/13/15 1815  BNP 284.3* 214.5* 280.4*    ProBNP (last 3 results) No results for input(s): PROBNP in the last 8760 hours.  CBG:  Recent Labs Lab 09/17/15 1107 09/17/15 1705 09/17/15 2056 09/18/15 0613 09/18/15 0742  GLUCAP 170* 305* 243* 237* 221*    Recent Results (from the past 240 hour(s))  Urine culture     Status: None   Collection Time: 09/13/15  8:01 PM  Result Value Ref Range Status   Specimen Description URINE, CLEAN CATCH  Final   Special Requests NONE  Final   Culture >=100,000 COLONIES/mL ESCHERICHIA COLI  Final   Report Status 09/16/2015 FINAL  Final   Organism ID, Bacteria ESCHERICHIA COLI  Final      Susceptibility   Escherichia coli - MIC*    AMPICILLIN <=2 SENSITIVE Sensitive     CEFAZOLIN <=4 SENSITIVE Sensitive     CEFTRIAXONE <=1 SENSITIVE Sensitive     CIPROFLOXACIN <=0.25 SENSITIVE Sensitive     GENTAMICIN <=1 SENSITIVE Sensitive     IMIPENEM <=0.25 SENSITIVE Sensitive     NITROFURANTOIN <=16 SENSITIVE Sensitive     TRIMETH/SULFA <=20 SENSITIVE Sensitive     AMPICILLIN/SULBACTAM <=2 SENSITIVE Sensitive     PIP/TAZO <=4 SENSITIVE Sensitive     * >=100,000 COLONIES/mL ESCHERICHIA COLI  MRSA PCR  Screening     Status: None   Collection Time: 09/14/15  1:30 AM  Result Value Ref Range Status   MRSA by PCR NEGATIVE NEGATIVE Final    Comment:        The GeneXpert MRSA Assay (FDA approved for NASAL specimens only), is one component of a comprehensive MRSA colonization surveillance program. It is not intended to diagnose MRSA infection nor to guide or monitor treatment for MRSA infections.      Studies: No results found.  Scheduled Meds: . atorvastatin  20 mg Oral QHS  . ciprofloxacin  500 mg Oral BID  . furosemide  80 mg Intravenous Q8H  . gabapentin  300 mg Oral BID  . insulin aspart  0-20 Units Subcutaneous TID WC  . insulin aspart  0-5 Units Subcutaneous QHS  . insulin glargine  46 Units Subcutaneous QHS  . metolazone  2.5 mg Oral Q Mon  . potassium chloride SA  20 mEq Oral Q Mon  . rivaroxaban  20 mg Oral Q supper  . sodium chloride  3 mL Intravenous Q12H   Continuous Infusions:   Principal Problem:   Acute on chronic diastolic CHF (congestive heart failure) (HCC) Active Problems:   HTN (hypertension)   DM (diabetes mellitus), type 2 with renal complications (HCC)   Epistaxis   OSA (obstructive sleep apnea)   Anticoagulant long-term use   Atrial fibrillation with slow ventricular response (HCC)   CKD (chronic kidney disease), stage III   Acute on chronic diastolic heart failure (HCC)   Normocytic anemia   Chronic diastolic CHF (congestive heart failure) (HCC)   On home oxygen therapy   CHF exacerbation (HCC)   Acute blood loss anemia   E. coli UTI    Time spent:     Reedsburg Area Med CtrHOMPSON,DANIEL MD [facility] Pager [phone]. If 7PM-7AM, please contact night-coverage at [email or web address], password [email or web address] 09/18/2015, 10:36 AM  LOS: 5 days

## 2015-09-18 NOTE — Progress Notes (Signed)
Pt refuses CPAP. Pt encouraged to call RT if pt changes mind.

## 2015-09-18 NOTE — Progress Notes (Signed)
Inpatient Diabetes Program Recommendations  AACE/ADA: New Consensus Statement on Inpatient Glycemic Control (2015)  Target Ranges:  Prepandial:   less than 140 mg/dL      Peak postprandial:   less than 180 mg/dL (1-2 hours)      Critically ill patients:  140 - 180 mg/dL   Review of Glycemic Control:Results for Mark Roberson, Mark Roberson (MRN [id number]) as of 09/18/2015 12:44  Ref. Range 09/16/2015 22:35 09/17/2015 05:53 09/17/2015 11:07 09/17/2015 17:05 09/17/2015 20:56 09/18/2015 06:13 09/18/2015 07:42  Glucose-Capillary Latest Ref Range: 65-99 mg/dL 086283 (H) 578142 (H) 469170 (H) 305 (H) 243 (H) 237 (H) 221 (H)      Diabetes history: Type 2 diabetes Outpatient Diabetes medications: Lantus 40 units q HS, Humalog 40 units breakfast, 30 units lunch, and 40 units with supper Current orders for Inpatient glycemic control:  Lantus 46 units q HS,  Novolog resistant tid with meals and HS Inpatient Diabetes Program Recommendations:    Note that patient was on large doses of Humalog meal coverage at home.  Please consider adding Novolog 10 units tid with meals (hold if patient eats less than 50%).  Thanks, Beryl MeagerJenny Tonga Prout, RN, BC-ADM Inpatient Diabetes Coordinator Pager [phone] (8a-5p)

## 2015-09-19 ENCOUNTER — Telehealth (HOSPITAL_COMMUNITY): Payer: Self-pay | Admitting: Vascular Surgery

## 2015-09-19 DIAGNOSIS — I5033 Acute on chronic diastolic (congestive) heart failure: Secondary | ICD-10-CM

## 2015-09-19 DIAGNOSIS — R040 Epistaxis: Secondary | ICD-10-CM

## 2015-09-19 DIAGNOSIS — N183 Chronic kidney disease, stage 3 (moderate): Secondary | ICD-10-CM

## 2015-09-19 DIAGNOSIS — I4891 Unspecified atrial fibrillation: Secondary | ICD-10-CM

## 2015-09-19 LAB — BASIC METABOLIC PANEL
ANION GAP: 10 (ref 5–15)
BUN: 37 mg/dL — ABNORMAL HIGH (ref 6–20)
CALCIUM: 9.3 mg/dL (ref 8.9–10.3)
CO2: 43 mmol/L — ABNORMAL HIGH (ref 22–32)
CREATININE: 1.58 mg/dL — AB (ref 0.61–1.24)
Chloride: 86 mmol/L — ABNORMAL LOW (ref 101–111)
GFR, EST AFRICAN AMERICAN: 50 mL/min — AB (ref >60)
GFR, EST NON AFRICAN AMERICAN: 43 mL/min — AB (ref >60)
Glucose, Bld: 193 mg/dL — ABNORMAL HIGH (ref 65–99)
Potassium: 3.7 mmol/L (ref 3.5–5.1)
SODIUM: 139 mmol/L (ref 135–145)

## 2015-09-19 LAB — GLUCOSE, CAPILLARY
GLUCOSE-CAPILLARY: 207 mg/dL — AB (ref 65–99)
GLUCOSE-CAPILLARY: 200 mg/dL — AB (ref 65–99)
GLUCOSE-CAPILLARY: 307 mg/dL — AB (ref 65–99)
Glucose-Capillary: 143 mg/dL — ABNORMAL HIGH (ref 65–99)

## 2015-09-19 LAB — FOLATE RBC
FOLATE, RBC: 2244 ng/mL (ref >498)
Folate, Hemolysate: 567.8 ng/mL
Hematocrit: 25.3 % — ABNORMAL LOW (ref 37.5–51.0)

## 2015-09-19 MED ORDER — INSULIN ASPART 100 UNIT/ML ~~LOC~~ SOLN
10.0000 [IU] | Freq: Three times a day (TID) | SUBCUTANEOUS | Status: DC
  Administered 2015-09-19: 10 [IU] via SUBCUTANEOUS

## 2015-09-19 MED ORDER — INSULIN GLARGINE 100 UNIT/ML ~~LOC~~ SOLN
50.0000 [IU] | Freq: Every day | SUBCUTANEOUS | Status: DC
  Administered 2015-09-19 – 2015-09-21 (×3): 50 [IU] via SUBCUTANEOUS
  Filled 2015-09-19 (×5): qty 0.5

## 2015-09-19 MED ORDER — POTASSIUM CHLORIDE CRYS ER 20 MEQ PO TBCR
20.0000 meq | EXTENDED_RELEASE_TABLET | Freq: Once | ORAL | Status: AC
  Administered 2015-09-19: 20 meq via ORAL
  Filled 2015-09-19: qty 1

## 2015-09-19 MED ORDER — INSULIN ASPART 100 UNIT/ML ~~LOC~~ SOLN
10.0000 [IU] | Freq: Three times a day (TID) | SUBCUTANEOUS | Status: DC
  Administered 2015-09-20 – 2015-09-22 (×8): 10 [IU] via SUBCUTANEOUS

## 2015-09-19 NOTE — Consult Note (Signed)
Advanced Heart Failure Team Consult Note  Referring Physician: Dr Janee Morn Primary Physician: Dr Silvio Pate Primary Cardiologist:  Dr Shirlee Latch  Reason for Consultation: A/C diastolic CHF Date of admission: 09/13/15  HPI:    Mark Roberson is a 69 y.o. 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  Last seen in HF clinic 06/26/15 and thought to be stable. Weight 311 lbs at that visit.   Presented to [facility] 09/13/15 with a nosebleed and worsening edema x several weeks. Weight 329 on admit.  Pertinent admission labs include Hgb 6.6, K 3.6, Creatinine 1.63, BNP 280.4, flat troponins. CXR showed stable cardiomegaly with mild pulmonary vascular congestion.   He has diuresed well so far on IV lasix, out 11 L and down 21 lbs. He is s/p 2 UPRBCs and Hgb 8.8 09/18/15.  States at home he has been gaining weight over past two months.  States he has been watching his fluid (approx one 32 oz a day) but eats a lot of salt including chinese food and egg drop soup.  States he had been taking medications as directed but doesn't know the doses of any of his medicines as he just takes whats in his pill box.  Denies any SOB or DOE. No orthopnea or PND.  He has not been using CPAP.  Says he tried it once in the hospital and "couldn't stand it".   Review of Systems: [y] = yes, [ ]  = no   General: Weight gain [y]; Weight loss [ ] ; Anorexia [ ] ; Fatigue [ ] ; Fever [ ] ; Chills [ ] ; Weakness [ ]   Cardiac: Chest pain/pressure [ ] ; Resting SOB [ ] ; Exertional SOB [ ] ; Orthopnea [ ] ; Pedal Edema [y]; Palpitations [ ] ; Syncope [ ] ; Presyncope [ ] ; Paroxysmal nocturnal dyspnea[ ]   Pulmonary: Cough [ ] ; Wheezing[ ] ; Hemoptysis[ ] ; Sputum [ ] ; Snoring [ ]   GI: Vomiting[ ] ; Dysphagia[ ] ; Melena[ ] ; Hematochezia [ ] ; Heartburn[ ] ; Abdominal pain [ ] ; Constipation [ ] ; Diarrhea [ ] ;  BRBPR [ ]   GU: Hematuria[ ] ; Dysuria [ ] ; Nocturia[ ]   Vascular: Pain in legs with walking [ ] ; Pain in feet with lying flat [ ] ; Non-healing sores [ ] ; Stroke [ ] ; TIA [ ] ; Slurred speech [ ] ;  Neuro: Headaches[ ] ; Vertigo[ ] ; Seizures[ ] ; Paresthesias[ ] ;Blurred vision [ ] ; Diplopia [ ] ; Vision changes [ ]   Ortho/Skin: Arthritis [y]; Joint pain [y]; Muscle pain [ ] ; Joint swelling [ ] ; Back Pain [ ] ; Rash [ ]   Psych: Depression[ ] ; Anxiety[ ]   Heme: Bleeding problems [ ] ; Clotting disorders [ ] ; Anemia [ ]   Endocrine: Diabetes [ ] ; Thyroid dysfunction[ ]   Home Medications Prior to Admission medications   Medication Sig Start Date End Date Taking? Authorizing Provider  acetaminophen (TYLENOL) 500 MG tablet Take 500 mg by mouth every 4 (four) hours as needed (pain). Do not exceed 4 gms of tylenol in 24 hours   Yes Historical Provider, MD  albuterol (PROVENTIL) (2.5 MG/3ML) 0.083% nebulizer solution Take 3 mLs (2.5 mg total) by nebulization every 6 (six) hours as needed for wheezing or shortness of breath. 11/14/14  Yes [name], MD  atorvastatin (LIPITOR) 20 MG tablet Take 20 mg by mouth at bedtime.    Yes Historical Provider, MD  cholecalciferol (VITAMIN D) 1000 UNITS tablet Take 1,000 Units by mouth  daily.   Yes Historical Provider, MD  gabapentin (NEURONTIN) 300 MG capsule Take 300 mg by mouth 2 (two) times daily.    Yes Historical Provider, MD  HUMALOG KWIKPEN 100 UNIT/ML KiwkPen Inject 3 time daily just before each meal 45-35-45 Patient taking differently: Inject 30-40 Units into the skin 3 (three) times daily. Inject 3 time daily just before each meal 40-30-40 08/15/15  Yes Romero Belling, MD  Insulin Glargine (LANTUS SOLOSTAR) 100 UNIT/ML Solostar Pen Inject 40 Units into the skin at bedtime.    Yes Historical Provider, MD  Magnesium 400 MG CAPS Take 400 mg by mouth daily.   Yes Historical Provider, MD  metolazone (ZAROXOLYN) 2.5 MG tablet Take 1 tablet (2.5 mg total) by mouth 2  (two) times a week. Every Mon and Fri Patient taking differently: Take 2.5 mg by mouth every Monday.  05/18/15  Yes Laurey Morale, MD  niacin 250 MG tablet Take 250 mg by mouth daily.   Yes Historical Provider, MD  Omega-3 Fatty Acids (FISH OIL) 1000 MG CAPS Take 2,000 mg by mouth daily.   Yes Historical Provider, MD  potassium chloride SA (K-DUR,KLOR-CON) 20 MEQ tablet Take 1 tablet (20 mEq total) by mouth 2 (two) times a week. Every Mon and Fri with metolazone Patient taking differently: Take 20 mEq by mouth every Monday.  05/18/15  Yes Laurey Morale, MD  PROAIR HFA 108 [phone] BASE) MCG/ACT inhaler INHALE 1 TO 2 PUFFS BY MOUTH FOUR TIMES DAILY AS DIRECTED AS NEEDED FOR WHEEZING AND SHORTNESS OF BREATH 04/06/15  Yes [name], MD  Rivaroxaban (XARELTO) 15 MG TABS tablet Take 1 tablet (15 mg total) by mouth daily with breakfast. Patient taking differently: Take 15 mg by mouth daily with supper.  06/26/15  Yes Laurey Morale, MD  torsemide (DEMADEX) 20 MG tablet Take 3 tabs in AM and 2 tabs in PM Patient taking differently: Take 40 mg by mouth 2 (two) times daily. Take 2 tabs in AM and 2 tabs in PM 05/18/15  Yes Laurey Morale, MD  levothyroxine (SYNTHROID, LEVOTHROID) 25 MCG tablet TAKE 1 TABLET BY MOUTH EVERY MORNING Patient not taking: Reported on 09/13/2015 07/04/15   Sharee Holster, NP    Past Medical History: Past Medical History  Diagnosis Date  . CHF (congestive heart failure) (HCC)   . Atrial fibrillation (HCC)   . Diabetes mellitus without complication (HCC)   . Hypertension   . Sleep apnea   . Asthma   . CKD (chronic kidney disease), stage III   . Anemia     Past Surgical History: Past Surgical History  Procedure Laterality Date  . 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 Wound debridement Right   . Multiple extractions with alveoloplasty N/A 01/17/2015    Procedure: Extraction of tooth #'s 14,15, 16 with alveolopalsty;  Surgeon: Charlynne Pander, DDS;  Location:  [facility] OR;  Service: Oral Surgery;  Laterality: N/A;  . Cardioversion N/A 04/03/2015    Procedure: CARDIOVERSION;  Surgeon: Laurey Morale, MD;  Location[facility] ENDOSCOPY;  Service: Cardiovascular;  Laterality: N/A;    Family History: Family History  Problem Relation Age of Onset  . COPD Mother   . Heart disease Father   . Cancer Sister     breast  . Heart disease Brother   . Diabetes Neg Hx     Social History: Social History   Social History  . Marital Status: Single  Spouse Name: N/A  . Number of Children: N/A  . Years of Education: N/A   Social History Main Topics  . Smoking status: Never Smoker   . Smokeless tobacco: Never Used  . Alcohol Use: Yes     Comment: rare  . Drug Use: No  . Sexual Activity: Not Asked   Other Topics Concern  . None   Social History Narrative    Allergies:  Allergies  Allergen Reactions  . Percocet [Oxycodone-Acetaminophen] Other (See Comments)    hallucination  . Penicillins Hives    Has patient had a PCN reaction causing immediate rash, facial/tongue/throat swelling, SOB or lightheadedness with hypotension: No Has patient had a PCN reaction causing severe rash involving mucus membranes or skin necrosis: No Has patient had a PCN reaction that required hospitalization No Has patient had a PCN reaction occurring within the last 10 years: No If all of the above answers are "NO", then may proceed with Cephalosporin use.    Objective:    Vital Signs:   Temp:  [98.2 F (36.8 C)-98.7 F (37.1 C)] 98.2 F (36.8 C) (12/27 0329) Pulse Rate:  [60-71] 67 (12/27 0329) Resp:  [18-71] 18 (12/27 0329) BP: (129-137)/(38-62) 129/62 mmHg (12/27 0329) SpO2:  [100 %] 100 % (12/27 0329) Weight:  [308 lb 6.8 oz (139.9 kg)] 308 lb 6.8 oz (139.9 kg) (12/27 0329) Last BM Date: 09/19/15  Weight change: Filed Weights   09/17/15 0634 09/18/15 0427 09/19/15 0329  Weight: 323 lb 11.2 oz (146.829 kg) 316 lb 14.4 oz (143.745 kg) 308 lb 6.8 oz (139.9 kg)     Intake/Output:   Intake/Output Summary (Last 24 hours) at 09/19/15 0857 Last data filed at 09/19/15 0754  Gross per 24 hour  Intake   1440 ml  Output   4300 ml  Net  -2860 ml     Physical Exam: General:  Chronically ill appearing. No resp difficulty HEENT: normal Neck: supple. JVP jaw cm . Carotids 2+ bilat; no bruits. No lymphadenopathy or thyromegaly appreciated. Cor: PMI nondisplaced. Irregular rate & rhythm. 1/6 SEM RUSB.  Lungs: Mild basilar crackles, normal effort Abdomen: Obese, soft, NT, ND, no HSM. No bruits or masses. +BS  Extremities: no cyanosis, clubbing, rash. 2+ pretibial edema Neuro: alert & orientedx3, cranial nerves grossly intact. moves all 4 extremities w/o difficulty. Affect pleasant  Telemetry: Afib 50s-60s  Labs: Basic Metabolic Panel:  Recent Labs Lab 09/14/15 0848 09/15/15 0453 09/16/15 0317 09/17/15 0354 09/18/15 0538  NA 142 140 139 140 140  K 3.4* 3.7 4.0 4.3 4.0  CL 94* 94* 93* 91* 88*  CO2 38* 38* 39* 41* 43*  GLUCOSE 120* 205* 244* 196* 274*  BUN 38* 34* 33* 34* 35*  CREATININE 1.44* 1.35* 1.44* 1.45* 1.59*  CALCIUM 9.0 8.8* 8.8* 9.0 9.0    Liver Function Tests:  Recent Labs Lab 09/14/15 0848  AST 16  ALT 12*  ALKPHOS 58  BILITOT 1.6*  PROT 6.1*  ALBUMIN 2.7*   No results for input(s): LIPASE, AMYLASE in the last 168 hours. No results for input(s): AMMONIA in the last 168 hours.  CBC:  Recent Labs Lab 09/13/15 1815 09/14/15 0848 09/14/15 1135 09/17/15 0354 09/18/15 0840  WBC 8.6 8.8 8.5 7.4 8.4  NEUTROABS  --  5.2  --   --   --   HGB 6.8* 8.3* 7.7* 7.7* 8.8*  HCT 23.7* 27.9* 25.9* 26.4* 30.1*  MCV 90.8 90.6 90.6 92.3 92.9  PLT 167 156 147* 160 169  Cardiac Enzymes:  Recent Labs Lab 09/14/15 0848 09/14/15 1135  TROPONINI 0.03 0.03    BNP: BNP (last 3 results)  Recent Labs  05/03/15 0516 05/25/15 1217 09/13/15 1815  BNP 284.3* 214.5* 280.4*    ProBNP (last 3 results) No results for  input(s): PROBNP in the last 8760 hours.   CBG:  Recent Labs Lab 09/18/15 0742 09/18/15 1156 09/18/15 1709 09/18/15 2147 09/19/15 0616  GLUCAP 221* 239* 322* 256* 207*    Coagulation Studies: No results for input(s): LABPROT, INR in the last 72 hours.  Other results: EKG: 09/14/15 Afib with slow VR in 50s  Imaging:  No results found.   Medications:     Current Medications: . atorvastatin  20 mg Oral QHS  . ciprofloxacin  500 mg Oral BID  . furosemide  80 mg Intravenous Q8H  . gabapentin  300 mg Oral BID  . insulin aspart  0-20 Units Subcutaneous TID WC  . insulin aspart  0-5 Units Subcutaneous QHS  . insulin glargine  46 Units Subcutaneous QHS  . metolazone  2.5 mg Oral Q Mon  . potassium chloride SA  20 mEq Oral Q Mon  . rivaroxaban  20 mg Oral Q supper  . sodium chloride  3 mL Intravenous Q12H     Infusions:      Assessment/Plan   Mark Roberson is a 69 y.o. 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   1. Acute on chronic diastolic HF, Echo 09/14/15 EF 96-04% - Much improved with IV lasix. Weight below previous baseline but still with 2+ ankle edema.  - Continue current lasix for now. Will reassess in am.  - Supp K  - Stressed importance of sodium restriction.  2. AF, Persistent - Rate controlled. Failed DCCV 03/2015 - Back on Xarelto after holding with nosebleed and anemia on admission 3. Anemia - Improved s/p 2 UPRBCs.  - Per primary team.  4. CKD stage III - Creatinine stable. Follow closely with diuresis.  5. OSA/suspected OHS - Refuses CPAP. Encouraged to try a different mask.  Will need a pulm appointment.  6. CAD - Stable. No CP. No ASA with Xarelto.  - Continue statin.   Length of Stay: 6  Mark Boettner PA-C 09/19/2015,  8:57 AM  Advanced Heart Failure Team Pager [phone] (M-F; 7a - 4p)  Please contact [facility] for night-coverage after hours (4p -7a ) and weekends on [email or web address]   Patient seen and examined with Otilio Saber, PA-C. We discussed all aspects of the encounter. I agree with the assessment and plan as stated above.   Echo images reviewed personally. He has recurrent marked volume overload in the setting of dietary noncompliance and poor insight into his HF and HF meds. Will continue IV lasix. I tried to educate him on need to watch his intake and also how to use a sliding scale diuretic regimen. He will need ongoing education and close f/u. He has chronic AF tht is rate control. Xarelto resumed after epistaxis controlled. Refuses CPAP.   Elmarie Devlin,MD 5:51 PM

## 2015-09-19 NOTE — Telephone Encounter (Signed)
Pt caregiver called she would like to speak to someone about why pt has not seen a HF doctor since he has been in the hospital

## 2015-09-19 NOTE — Plan of Care (Signed)
Problem: Acute Rehab PT Goals(only PT should resolve) Goal: Pt Will Go Up/Down Stairs Outcome: Not Met (add Reason) Deferred practicing stairs - do not anticipate patient will have difficulty

## 2015-09-19 NOTE — Procedures (Signed)
Pt refuses the use of CPAP and does not want to be asked again.

## 2015-09-19 NOTE — Discharge Instructions (Addendum)
Rivaroxaban oral tablets What is this medicine? RIVAROXABAN (ri va ROX a ban) is an anticoagulant (blood thinner). It is used to treat blood clots in the lungs or in the veins. It is also used after knee or hip surgeries to prevent blood clots. It is also used to lower the chance of stroke in people with a medical condition called atrial fibrillation. This medicine may be used for other purposes; ask your health care provider or pharmacist if you have questions. What should I tell my health care provider before I take this medicine? They need to know if you have any of these conditions: -bleeding disorders -bleeding in the brain -blood in your stools (black or tarry stools) or if you have blood in your vomit -history of stomach bleeding -kidney disease -liver disease -low blood counts, like low white cell, platelet, or red cell counts -recent or planned spinal or epidural procedure -take medicines that treat or prevent blood clots -an unusual or allergic reaction to rivaroxaban, other medicines, foods, dyes, or preservatives -pregnant or trying to get pregnant -breast-feeding How should I use this medicine? Take this medicine by mouth with a glass of water. Follow the directions on the prescription label. Take your medicine at regular intervals. Do not take it more often than directed. Do not stop taking except on your doctor's advice. Stopping this medicine may increase your risk of a blood clot. Be sure to refill your prescription before you run out of medicine. If you are taking this medicine after hip or knee replacement surgery, take it with or without food. If you are taking this medicine for atrial fibrillation, take it with your evening meal. If you are taking this medicine to treat blood clots, take it with food at the same time each day. If you are unable to swallow your tablet, you may crush the tablet and mix it in applesauce. Then, immediately eat the applesauce. You should eat more  food right after you eat the applesauce containing the crushed tablet. Talk to your pediatrician regarding the use of this medicine in children. Special care may be needed. Overdosage: If you think you have taken too much of this medicine contact a poison control center or emergency room at once. NOTE: This medicine is only for you. Do not share this medicine with others. What if I miss a dose? If you take your medicine once a day and miss a dose, take the missed dose as soon as you remember. If you take your medicine twice a day and miss a dose, take the missed dose immediately. In this instance, 2 tablets may be taken at the same time. The next day you should take 1 tablet twice a day as directed. What may interact with this medicine? -aspirin and aspirin-like medicines -certain antibiotics like erythromycin, azithromycin, and clarithromycin -certain medicines for fungal infections like ketoconazole and itraconazole -certain medicines for irregular heart beat like amiodarone, quinidine, dronedarone -certain medicines for seizures like carbamazepine, phenytoin -certain medicines that treat or prevent blood clots like warfarin, enoxaparin, and dalteparin -conivaptan -diltiazem -felodipine -indinavir -lopinavir; ritonavir -NSAIDS, medicines for pain and inflammation, like ibuprofen or naproxen -ranolazine -rifampin -ritonavir -St. John's wort -verapamil This list may not describe all possible interactions. Give your health care provider a list of all the medicines, herbs, non-prescription drugs, or dietary supplements you use. Also tell them if you smoke, drink alcohol, or use illegal drugs. Some items may interact with your medicine. What should I watch for while using this   medicine? Visit your doctor or health care professional for regular checks on your progress. Your condition will be monitored carefully while you are receiving this medicine. Notify your doctor or health care  professional and seek emergency treatment if you develop breathing problems; changes in vision; chest pain; severe, sudden headache; pain, swelling, warmth in the leg; trouble speaking; sudden numbness or weakness of the face, arm, or leg. These can be signs that your condition has gotten worse. If you are going to have surgery, tell your doctor or health care professional that you are taking this medicine. Tell your health care professional that you use this medicine before you have a spinal or epidural procedure. Sometimes people who take this medicine have bleeding problems around the spine when they have a spinal or epidural procedure. This bleeding is very rare. If you have a spinal or epidural procedure while on this medicine, call your health care professional immediately if you have back pain, numbness or tingling (especially in your legs and feet), muscle weakness, paralysis, or loss of bladder or bowel control. Avoid sports and activities that might cause injury while you are using this medicine. Severe falls or injuries can cause unseen bleeding. Be careful when using sharp tools or knives. Consider using an Neurosurgeon. Take special care brushing or flossing your teeth. Report any injuries, bruising, or red spots on the skin to your doctor or health care professional. What side effects may I notice from receiving this medicine? Side effects that you should report to your doctor or health care professional as soon as possible: -allergic reactions like skin rash, itching or hives, swelling of the face, lips, or tongue -back pain -redness, blistering, peeling or loosening of the skin, including inside the mouth -signs and symptoms of bleeding such as bloody or black, tarry stools; red or dark-brown urine; spitting up blood or brown material that looks like coffee grounds; red spots on the skin; unusual bruising or bleeding from the eye, gums, or nose Side effects that usually do not require  medical attention (Report these to your doctor or health care professional if they continue or are bothersome.): -dizziness -muscle pain This list may not describe all possible side effects. Call your doctor for medical advice about side effects. You may report side effects to FDA at 1-800-FDA-1088. Where should I keep my medicine? Keep out of the reach of children. Store at room temperature between 15 and 30 degrees C (59 and 86 degrees F). Throw away any unused medicine after the expiration date. NOTE: This sheet is a summary. It may not cover all possible information. If you have questions about this medicine, talk to your doctor, pharmacist, or health care provider.    2016, Elsevier/Gold Standard. (2014-09-07 12:45:34)     Follow with [name], MD in 5-7 days  Please get a complete blood count and chemistry panel checked by your Primary MD at your next visit, and again as instructed by your Primary MD. Please get your medications reviewed and adjusted by your Primary MD.  Please request your Primary MD to go over all Hospital Tests and Procedure/Radiological results at the follow up, please get all Hospital records sent to your Prim MD by signing hospital release before you go home.  If you had Pneumonia of Lung problems at the Hospital: Please get a 2 view Chest X ray done in 6-8 weeks after hospital discharge or sooner if instructed by your Primary MD.  If you have Congestive Heart Failure:  Please call your Cardiologist or Primary MD anytime you have any of the following symptoms:  1) 3 pound weight gain in 24 hours or 5 pounds in 1 week  2) shortness of breath, with or without a dry hacking cough  3) swelling in the hands, feet or stomach  4) if you have to sleep on extra pillows at night in order to breathe  Follow cardiac low salt diet and 1.5 lit/day fluid restriction.  If you have diabetes Accuchecks 4 times/day, Once in AM empty stomach and then before each  meal. Log in all results and show them to your primary doctor at your next visit. If any glucose reading is under 80 or above 300 call your primary MD immediately.  If you have Seizure/Convulsions/Epilepsy: Please do not drive, operate heavy machinery, participate in activities at heights or participate in high speed sports until you have seen by Primary MD or a Neurologist and advised to do so again.  If you had Gastrointestinal Bleeding: Please ask your Primary MD to check a complete blood count within one week of discharge or at your next visit. Your endoscopic/colonoscopic biopsies that are pending at the time of discharge, will also need to followed by your Primary MD.  Get Medicines reviewed and adjusted. Please take all your medications with you for your next visit with your Primary MD  Please request your Primary MD to go over all hospital tests and procedure/radiological results at the follow up, please ask your Primary MD to get all Hospital records sent to his/her office.  If you experience worsening of your admission symptoms, develop shortness of breath, life threatening emergency, suicidal or homicidal thoughts you must seek medical attention immediately by calling 911 or calling your MD immediately  if symptoms less severe.  You must read complete instructions/literature along with all the possible adverse reactions/side effects for all the Medicines you take and that have been prescribed to you. Take any new Medicines after you have completely understood and accpet all the possible adverse reactions/side effects.   Do not drive or operate heavy machinery when taking Pain medications.   Do not take more than prescribed Pain, Sleep and Anxiety Medications  Special Instructions: If you have smoked or chewed Tobacco  in the last 2 yrs please stop smoking, stop any regular Alcohol  and or any Recreational drug use.  Wear Seat belts while driving.  Please note You were cared for  by a hospitalist during your hospital stay. If you have any questions about your discharge medications or the care you received while you were in the hospital after you are discharged, you can call the unit and asked to speak with the hospitalist on call if the hospitalist that took care of you is not available. Once you are discharged, your primary care physician will handle any further medical issues. Please note that NO REFILLS for any discharge medications will be authorized once you are discharged, as it is imperative that you return to your primary care physician (or establish a relationship with a primary care physician if you do not have one) for your aftercare needs so that they can reassess your need for medications and monitor your lab values.  You can reach the hospitalist office at phone [phone] or fax [phone]   If you do not have a primary care physician, you can call [phone] for a physician referral.  Activity: As tolerated with Full fall precautions use walker/cane & assistance as needed  Diet: heart  healthy  Disposition Home

## 2015-09-19 NOTE — Telephone Encounter (Signed)
Spoke w/Rita, she is aware we saw pt today and Dr Shirlee LatchMcLean will be back and see pt tomorrow

## 2015-09-19 NOTE — Progress Notes (Signed)
[facility] PROGRESS NOTE  Mark Roberson ZOX:[id number] DOB: Sep 29, 1945 DOA: 09/13/2015 PCP: [name], MD  Brief interval history  69 year old male with a history of persistent atrial fibrillation, diastolic CHF, chronic respiratory failure on 2 L, CKD stage III, diabetes mellitus, hypertension presented with increasing lower extremity edema and epistaxis. The patient states that he has had intermittent epistaxis for many years, and he states that he has seen ENT whom has cauterized and exposed blood vessel in the past. The patient endorses compliance with his medications, but states that he has had dietary indiscretion eating carryout [place] food at least once weekly. He feels that he has gained over 20 pounds in the past 2 months. He denies any chest pain, nausea, vomiting, diarrhea, hemoptysis, hematochezia, melena, hematuria. In the emergency department, the patient was noted to have a hemoglobin 6.8 and serum creatinine of 1.63. Fecal occult blood test was negative. The patient was transfused 2 units PRBC and started on intravenous furosemide. Weekly metalazone resumed.  Patient has diuresed -12.385 L.     Assessment/Plan: #1 acute on chronic diastolic heart failure Likely secondary to dietary indiscretions in the setting of obstructive sleep apnea. Patient resistant to using C Pap machine. Patient is -2.870 L over the past 24 hours and -12.385 L during this hospitalization. Patient still with some bibasilar crackles or lower extremity edema. Current weight is 308 pounds. Dry weight is approximately 308 pounds. Continue IV diuretics and weekly metalazone. Continue statin. Cardiology following. Follow.  #2 Escherichia coli UTI Continue oral Cipro and treat for a total of 7 days.  #3 hypertension Stable.  #4  well-controlled type 2 diabetes mellitus Hemoglobin A1c 6.5 on 09/15/2015. CBGs have ranged from 200-307. Increase Lantus to 50 units daily. Place on NovoLog 10  units 3 times a day with meals. Continue resistant sliding scale insulin. Follow.  #5 epistaxis Stable. No further nasal bleeding.  #6 atrial fibrillation CHA2DS2VASC score is 6. Patient currently rate controlled now. Epistaxis seem to have resolved. Xarelto has been resumed. Follow.  #7 chronic kidney disease stage III Stable.  #8 obstructive sleep apnea  #9 acute blood loss anemia Status post 2 units packed red blood cells. H&H stable.  #10 prophylaxis Xarelto for DVT prophylaxis.   Code Status: DNR Family Communication: Updated patient. No family at bedside.  Disposition Plan: Home when adequately diuresed and ok with cardiology, hopefully in the next one to 2 days.   Consultants:  None  Procedures:  Chest x-ray 09/13/2015  Lower extremity Dopplers 09/15/2015   2 units packed red blood cells 09/14/2015  Antibiotics:  Oral ciprofloxacin 09/16/2015  HPI/Subjective: Patient states shortness of breath has improved. Patient denies chest pain. No nasal bleeding.  Objective: Filed Vitals:   09/18/15 2025 09/19/15 0329  BP: 137/59 129/62  Pulse: 71 67  Temp: 98.7 F (37.1 C) 98.2 F (36.8 C)  Resp: 71 18    Intake/Output Summary (Last 24 hours) at 09/19/15 1108 Last data filed at 09/19/15 1055  Gross per 24 hour  Intake   1440 ml  Output   4550 ml  Net  -3110 ml   Filed Weights   09/17/15 0634 09/18/15 0427 09/19/15 0329  Weight: 146.829 kg (323 lb 11.2 oz) 143.745 kg (316 lb 14.4 oz) 139.9 kg (308 lb 6.8 oz)    Exam:   General:  NAD  Cardiovascular: Irregularly irregular  Respiratory: Bibasilar crackles  Abdomen: Soft/NT/ND/+BS  Musculoskeletal: No c/c/. 1-2 + BLE edema  Data Reviewed: Basic Metabolic Panel:  Recent Labs Lab 09/15/15 0453 09/16/15 0317 09/17/15 0354 09/18/15 0538 09/19/15 0739  NA 140 139 140 140 139  K 3.7 4.0 4.3 4.0 3.7  CL 94* 93* 91* 88* 86*  CO2 38* 39* 41* 43* 43*  GLUCOSE 205* 244* 196* 274* 193*  BUN  34* 33* 34* 35* 37*  CREATININE 1.35* 1.44* 1.45* 1.59* 1.58*  CALCIUM 8.8* 8.8* 9.0 9.0 9.3   Liver Function Tests:  Recent Labs Lab 09/14/15 0848  AST 16  ALT 12*  ALKPHOS 58  BILITOT 1.6*  PROT 6.1*  ALBUMIN 2.7*   No results for input(s): LIPASE, AMYLASE in the last 168 hours. No results for input(s): AMMONIA in the last 168 hours. CBC:  Recent Labs Lab 09/13/15 1815 09/14/15 0848 09/14/15 1135 09/17/15 0354 09/18/15 0840  WBC 8.6 8.8 8.5 7.4 8.4  NEUTROABS  --  5.2  --   --   --   HGB 6.8* 8.3* 7.7* 7.7* 8.8*  HCT 23.7* 27.9* 25.9* 26.4* 30.1*  MCV 90.8 90.6 90.6 92.3 92.9  PLT 167 156 147* 160 169   Cardiac Enzymes:  Recent Labs Lab 09/14/15 0848 09/14/15 1135  TROPONINI 0.03 0.03   BNP (last 3 results)  Recent Labs  05/03/15 0516 05/25/15 1217 09/13/15 1815  BNP 284.3* 214.5* 280.4*    ProBNP (last 3 results) No results for input(s): PROBNP in the last 8760 hours.  CBG:  Recent Labs Lab 09/18/15 0742 09/18/15 1156 09/18/15 1709 09/18/15 2147 09/19/15 0616  GLUCAP 221* 239* 322* 256* 207*    Recent Results (from the past 240 hour(s))  Urine culture     Status: None   Collection Time: 09/13/15  8:01 PM  Result Value Ref Range Status   Specimen Description URINE, CLEAN CATCH  Final   Special Requests NONE  Final   Culture >=100,000 COLONIES/mL ESCHERICHIA COLI  Final   Report Status 09/16/2015 FINAL  Final   Organism ID, Bacteria ESCHERICHIA COLI  Final      Susceptibility   Escherichia coli - MIC*    AMPICILLIN <=2 SENSITIVE Sensitive     CEFAZOLIN <=4 SENSITIVE Sensitive     CEFTRIAXONE <=1 SENSITIVE Sensitive     CIPROFLOXACIN <=0.25 SENSITIVE Sensitive     GENTAMICIN <=1 SENSITIVE Sensitive     IMIPENEM <=0.25 SENSITIVE Sensitive     NITROFURANTOIN <=16 SENSITIVE Sensitive     TRIMETH/SULFA <=20 SENSITIVE Sensitive     AMPICILLIN/SULBACTAM <=2 SENSITIVE Sensitive     PIP/TAZO <=4 SENSITIVE Sensitive     * >=100,000  COLONIES/mL ESCHERICHIA COLI  MRSA PCR Screening     Status: None   Collection Time: 09/14/15  1:30 AM  Result Value Ref Range Status   MRSA by PCR NEGATIVE NEGATIVE Final    Comment:        The GeneXpert MRSA Assay (FDA approved for NASAL specimens only), is one component of a comprehensive MRSA colonization surveillance program. It is not intended to diagnose MRSA infection nor to guide or monitor treatment for MRSA infections.      Studies: No results found.  Scheduled Meds: . atorvastatin  20 mg Oral QHS  . ciprofloxacin  500 mg Oral BID  . furosemide  80 mg Intravenous Q8H  . gabapentin  300 mg Oral BID  . insulin aspart  0-20 Units Subcutaneous TID WC  . insulin aspart  0-5 Units Subcutaneous QHS  . insulin glargine  46 Units Subcutaneous QHS  . metolazone  2.5 mg Oral Q Mon  .  potassium chloride SA  20 mEq Oral Q Mon  . potassium chloride  20 mEq Oral Once  . rivaroxaban  20 mg Oral Q supper  . sodium chloride  3 mL Intravenous Q12H   Continuous Infusions:   Principal Problem:   Acute on chronic diastolic CHF (congestive heart failure) (HCC) Active Problems:   HTN (hypertension)   DM (diabetes mellitus), type 2 with renal complications (HCC)   Epistaxis   OSA (obstructive sleep apnea)   Anticoagulant long-term use   Atrial fibrillation with slow ventricular response (HCC)   CKD (chronic kidney disease), stage III   Acute on chronic diastolic heart failure (HCC)   Normocytic anemia   Chronic diastolic CHF (congestive heart failure) (HCC)   On home oxygen therapy   CHF exacerbation (HCC)   Acute blood loss anemia   E. coli UTI    Time spent: 35 mins    [facility] MD [facility] Pager [phone]. If 7PM-7AM, please contact night-coverage at [email or web address], password [facility] 09/19/2015, 11:08 AM  LOS: 6 days

## 2015-09-19 NOTE — Progress Notes (Signed)
   Physical Therapy Treatment and discharge Patient Details Name: Mark Roberson MRN: [id number] DOB: 03-Jul-1946 Today's Date: 2015/09/30    History of Present Illness 69 y.o. male with h/o a fib, HTN, DM, CHF, OSA, asthma, CKD-III, peripheral neuropathy, O2 dependence admitted with acute on chronic CHF.    PT Comments    Patient modified independent for all mobility with no loss of balance noted during gait or transfers, even with perturbations.  No further PT needs identified.  Patient has all necessary equipment at home.  Will sign off.   Follow Up Recommendations  No PT follow up     Equipment Recommendations  None recommended by PT    Recommendations for Other Services       Precautions / Restrictions Precautions Precautions: Fall Precaution Comments: 2 falls in past year, BLEs buckled; O2 dependent at baseline Restrictions Weight Bearing Restrictions: No    Mobility  Bed Mobility               General bed mobility comments: in recliner upon arrival  Transfers Overall transfer level: Modified independent Equipment used: Rolling walker (2 wheeled) Transfers: Sit to/from Stand Sit to Stand: Modified independent (Device/Increase time)            Ambulation/Gait Ambulation/Gait assistance: Modified independent (Device/Increase time) Ambulation Distance (Feet): 200 Feet Assistive device: Rolling walker (2 wheeled) Gait Pattern/deviations: WFL(Within Functional Limits);Trunk flexed     General Gait Details: ambulated with 2L O2 [place], 1/4 dyspnea, no LOB even with perturbations   Stairs            Wheelchair Mobility    Modified Rankin (Stroke Patients Only)       Balance Overall balance assessment: No apparent balance deficits (not formally assessed);History of Falls                                  Cognition Arousal/Alertness: Awake/alert Behavior During Therapy: Flat affect Overall Cognitive Status: Within Functional  Limits for tasks assessed                      Exercises      General Comments        Pertinent Vitals/Pain Pain Assessment: No/denies pain    Home Living                      Prior Function            PT Goals (current goals can now be found in the care plan section) Progress towards PT goals: Goals met/education completed, patient discharged from PT    Frequency       PT Plan Current plan remains appropriate    Co-evaluation             End of Session Equipment Utilized During Treatment: Oxygen Activity Tolerance: Patient tolerated treatment well;No increased pain Patient left: in chair;with call bell/phone within reach     Time: 1113-1121 PT Time Calculation (min) (ACUTE ONLY): 8 min  Charges:  $Gait Training: 8-22 mins                    G Codes:      Shanna Cisco Sep 30, 2015, 11:22 AM  09/30/2015 [place], [place]

## 2015-09-19 NOTE — Care Management Important Message (Signed)
Important Message  Patient Details  Name: Mark GoldsMichael Roberson MRN: [id number] Date of Birth: March 20, 1946   Medicare Important Message Given:  Yes    [name], Kennedy Bohanon Abena 09/19/2015, 12:15 PM

## 2015-09-20 DIAGNOSIS — N390 Urinary tract infection, site not specified: Secondary | ICD-10-CM

## 2015-09-20 DIAGNOSIS — E1121 Type 2 diabetes mellitus with diabetic nephropathy: Secondary | ICD-10-CM

## 2015-09-20 DIAGNOSIS — B9620 Unspecified Escherichia coli [E. coli] as the cause of diseases classified elsewhere: Secondary | ICD-10-CM

## 2015-09-20 DIAGNOSIS — Z794 Long term (current) use of insulin: Secondary | ICD-10-CM

## 2015-09-20 DIAGNOSIS — Z7901 Long term (current) use of anticoagulants: Secondary | ICD-10-CM

## 2015-09-20 LAB — BASIC METABOLIC PANEL
ANION GAP: 12 (ref 5–15)
BUN: 40 mg/dL — ABNORMAL HIGH (ref 6–20)
CHLORIDE: 86 mmol/L — AB (ref 101–111)
CO2: 41 mmol/L — ABNORMAL HIGH (ref 22–32)
Calcium: 9.4 mg/dL (ref 8.9–10.3)
Creatinine, Ser: 1.79 mg/dL — ABNORMAL HIGH (ref 0.61–1.24)
GFR calc Af Amer: 43 mL/min — ABNORMAL LOW (ref >60)
GFR, EST NON AFRICAN AMERICAN: 37 mL/min — AB (ref >60)
Glucose, Bld: 165 mg/dL — ABNORMAL HIGH (ref 65–99)
POTASSIUM: 4.2 mmol/L (ref 3.5–5.1)
SODIUM: 139 mmol/L (ref 135–145)

## 2015-09-20 LAB — GLUCOSE, CAPILLARY
GLUCOSE-CAPILLARY: 144 mg/dL — AB (ref 65–99)
Glucose-Capillary: 165 mg/dL — ABNORMAL HIGH (ref 65–99)
Glucose-Capillary: 172 mg/dL — ABNORMAL HIGH (ref 65–99)
Glucose-Capillary: 183 mg/dL — ABNORMAL HIGH (ref 65–99)

## 2015-09-20 MED ORDER — TORSEMIDE 20 MG PO TABS
40.0000 mg | ORAL_TABLET | Freq: Every day | ORAL | Status: DC
  Administered 2015-09-20: 40 mg via ORAL
  Filled 2015-09-20: qty 2

## 2015-09-20 MED ORDER — TORSEMIDE 20 MG PO TABS
60.0000 mg | ORAL_TABLET | Freq: Every day | ORAL | Status: DC
  Administered 2015-09-21: 60 mg via ORAL
  Filled 2015-09-20: qty 3

## 2015-09-20 NOTE — Progress Notes (Signed)
Advanced Heart Failure Rounding Note  PCP: Dr. Silvio PateElizabeth Roberson Primary Cardiologist: Dr. Shirlee LatchMcLean   Subjective:    Breathing feels better. Frustrated that home torsemide regimen has not been working.   Weight unchanged despite >5 L out x 24hrs.  States he was weighed standing this am, so unclear reason for inaccuracy.   Objective:   Weight Range: 308 lb 12.8 oz (140.071 kg) Body mass index is 41.87 kg/(m^2).   Vital Signs:   Temp:  [98.1 F (36.7 C)-98.4 F (36.9 C)] 98.2 F (36.8 C) (12/28 0323) Pulse Rate:  [51-64] 64 (12/28 0323) Resp:  [18-20] 20 (12/28 0323) BP: (93-117)/(47-50) 93/47 mmHg (12/28 0323) SpO2:  [99 %-100 %] 100 % (12/28 0323) Weight:  [308 lb 12.8 oz (140.071 kg)] 308 lb 12.8 oz (140.071 kg) (12/28 0331) Last BM Date: 09/19/15  Weight change: Filed Weights   09/18/15 0427 09/19/15 0329 09/20/15 0331  Weight: 316 lb 14.4 oz (143.745 kg) 308 lb 6.8 oz (139.9 kg) 308 lb 12.8 oz (140.071 kg)    Intake/Output:   Intake/Output Summary (Last 24 hours) at 09/20/15 0932 Last data filed at 09/20/15 0856  Gross per 24 hour  Intake    540 ml  Output   4425 ml  Net  -3885 ml     Physical Exam: General: Chronically ill appearing. No resp difficulty HEENT: normal Neck: supple. JVP 7-8 cm with mild HJR. Carotids 2+ bilat; no bruits. No thyromegaly or nodule noted. Cor: PMI nondisplaced. Irregular rate & rhythm. 1/6 SEM RUSB.  Lungs: Mild basilar crackles, normal effort Abdomen: Obese, soft, non-tender, non-distended, no HSM. No bruits or masses. +BS  Extremities: no cyanosis, clubbing, rash. 1-2+ ankle edema Neuro: alert & orientedx3, cranial nerves grossly intact. moves all 4 extremities w/o difficulty. Affect pleasant  Telemetry: Afib 60s-70s  Labs: CBC  Recent Labs  09/18/15 0840  WBC 8.4  HGB 8.8*  HCT 30.1*  MCV 92.9  PLT 169   Basic Metabolic Panel  Recent Labs  09/19/15 0739 09/20/15 0412  NA 139 139  K 3.7 4.2  CL 86* 86*   CO2 43* 41*  GLUCOSE 193* 165*  BUN 37* 40*  CREATININE 1.58* 1.79*  CALCIUM 9.3 9.4   Liver Function Tests No results for input(s): AST, ALT, ALKPHOS, BILITOT, PROT, ALBUMIN in the last 72 hours. No results for input(s): LIPASE, AMYLASE in the last 72 hours. Cardiac Enzymes No results for input(s): CKTOTAL, CKMB, CKMBINDEX, TROPONINI in the last 72 hours.  BNP: BNP (last 3 results)  Recent Labs  05/03/15 0516 05/25/15 1217 09/13/15 1815  BNP 284.3* 214.5* 280.4*    ProBNP (last 3 results) No results for input(s): PROBNP in the last 8760 hours.   D-Dimer No results for input(s): DDIMER in the last 72 hours. Hemoglobin A1C No results for input(s): HGBA1C in the last 72 hours. Fasting Lipid Panel No results for input(s): CHOL, HDL, LDLCALC, TRIG, CHOLHDL, LDLDIRECT in the last 72 hours. Thyroid Function Tests No results for input(s): TSH, T4TOTAL, T3FREE, THYROIDAB in the last 72 hours.  Invalid input(s): FREET3  Other results:     Imaging/Studies:   No results found.  Latest Echo  Latest Cath   Medications:     Scheduled Medications: . atorvastatin  20 mg Oral QHS  . ciprofloxacin  500 mg Oral BID  . furosemide  80 mg Intravenous Q8H  . gabapentin  300 mg Oral BID  . insulin aspart  0-20 Units Subcutaneous TID WC  . insulin aspart  0-5 Units Subcutaneous QHS  . insulin aspart  10 Units Subcutaneous TID WC  . insulin glargine  50 Units Subcutaneous QHS  . metolazone  2.5 mg Oral Q Mon  . potassium chloride SA  20 mEq Oral Q Mon  . rivaroxaban  20 mg Oral Q supper  . sodium chloride  3 mL Intravenous Q12H     Infusions:     PRN Medications:  sodium chloride, acetaminophen, albuterol, ondansetron (ZOFRAN) IV, sodium chloride   Assessment/Plan   Mark Roberson is a 69 y.o. 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   1. Acute on chronic diastolic HF, Echo 09/14/15 EF 96-04% - Much improved with IV lasix. Weight remains below perceived baseline but still has 1-2+ ankle edema. Encouraged to ambulate.  - Received 80 mg IV lasix this am. Weight unchanged despite 5L out in 24 hrs, was weighed standing.  - Supp K as needed for goal > 4.0 - Stressed importance of sodium restriction.  - With creatinine trending up, will transition to po torsemide this pm.  - He will need adjustment of his home diuretic regimen, with poor response to recent increases of torsemide.  2. AF, Persistent - Rate controlled. Failed DCCV 03/2015 - Xarelto held for epistaxis, now back on.  3. Anemia - Improved s/p 2 UPRBCs.  - Per primary team.  4. AKI on CKD stage III - Creatinine trending up. 1.45 -> 1.59 -> 1.58 -> 1.79 - Follow closely with diuresis. Will switch over to torsemide this pm and assess response.  5. 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  6. CAD - Stable. No CP. No ASA with Xarelto.  - Continue statin.   Length of Stay: 7  Mark Enriquez PA-C 09/20/2015, 9:32 AM  Advanced Heart Failure Team Pager [phone] (M-F; 7a - 4p)  Please contact [facility] for night-coverage after hours (4p -7a ) and weekends on [email or web address]  Patient seen with PA, agree with the above note.  Volume status looks better, JVP about 7-8 cm.  Will convert to po diuretics today, start torsemide 60 mg bid and continue twice weekly metolazone.  Will need to follow closely as outpatient, would arrange for home health so we can give Lasix IV at home if needed.   Mark Roberson 09/20/2015 1:51 PM

## 2015-09-20 NOTE — Progress Notes (Signed)
PROGRESS NOTE  Mark GoldsMichael Butters ZOX:[id number] DOB: Sep 10, 1946 DOA: 09/13/2015 PCP: Myrlene BrokerElizabeth A Crawford, MD  Brief interval history / HPI  69 year old male with a history of persistent atrial fibrillation, diastolic CHF, chronic respiratory failure on 2 L, CKD stage III, diabetes mellitus, hypertension presented with increasing lower extremity edema and epistaxis. The patient states that he has had intermittent epistaxis for many years, and he states that he has seen ENT whom has cauterized and exposed blood vessel in the past. The patient endorses compliance with his medications, but states that he has had dietary indiscretion eating carryout [place] food at least once weekly. He feels that he has gained over 20 pounds in the past 2 months.   Subjective: - Very flat affect this morning, has no specific complaints, appreciate swelling in his lower extremities improved as well as his breathing is improved. He denies any chest pain   Assessment/Plan: Acute on chronic diastolic heart failure - Likely secondary to dietary indiscretions in the setting of obstructive sleep apnea. Patient resistant to using C Pap machine.  - Patient is  -15.6 L during this hospitalization. Patient still with some bibasilar crackles and lower extremity edema.  - Current weight is 308 pounds with no change since yesterday despite being net -5 L overnight - Mild increasing creatinine this morning, hold IV Lasix per cardiology and resume metolazone this afternoon  Escherichia coli UTI - Continue oral Cipro and treat for a total of 7 days, today is day 4  Hypertension - Stable, most recent blood pressure 93/47 likely in the setting of diuresis  Well-controlled type 2 diabetes mellitus - Hemoglobin A1c 6.5 on 09/15/2015.  - Within Goal in the last 24 hours, continue current regimen  Epistaxis - Stable. No further nasal bleeding.  Atrial fibrillation - CHA2DS2VASC score is 6. Patient currently rate controlled  now. Epistaxis seem to have resolved. Xarelto has been resumed. Follow.  Chronic kidney disease stage III - Overall stable, hold Lasix today, repeat BMP in the morning  Obstructive sleep apnea  Acute blood loss anemia Status post 2 units packed red blood cells. H&H stable.     Code Status: DNR Family Communication: Updated patient. No family at bedside.  Disposition Plan: Home when adequately diuresed and ok with cardiology   Consultants:  None  Procedures:  Chest x-ray 09/13/2015  Lower extremity Dopplers 09/15/2015   2 units packed red blood cells 09/14/2015  Antibiotics:  Oral ciprofloxacin 09/16/2015  Objective: Filed Vitals:   09/19/15 2158 09/20/15 0323  BP: 113/50 93/47  Pulse: 58 64  Temp: 98.1 F (36.7 C) 98.2 F (36.8 C)  Resp: 18 20    Intake/Output Summary (Last 24 hours) at 09/20/15 1339 Last data filed at 09/20/15 0900  Gross per 24 hour  Intake    540 ml  Output   4325 ml  Net  -3785 ml   Filed Weights   09/18/15 0427 09/19/15 0329 09/20/15 0331  Weight: 143.745 kg (316 lb 14.4 oz) 139.9 kg (308 lb 6.8 oz) 140.071 kg (308 lb 12.8 oz)   Exam:   General:  NAD  Cardiovascular: Irregularly irregular  Respiratory: Bibasilar crackles  Abdomen: Soft/NT/ND/+BS  Musculoskeletal: No c/c/. 1-2 + BLE edema  Neuro: non focal   Data Reviewed: Basic Metabolic Panel:  Recent Labs Lab 09/16/15 0317 09/17/15 0354 09/18/15 0538 09/19/15 0739 09/20/15 0412  NA 139 140 140 139 139  K 4.0 4.3 4.0 3.7 4.2  CL 93* 91* 88* 86* 86*  CO2 39*  41* 43* 43* 41*  GLUCOSE 244* 196* 274* 193* 165*  BUN 33* 34* 35* 37* 40*  CREATININE 1.44* 1.45* 1.59* 1.58* 1.79*  CALCIUM 8.8* 9.0 9.0 9.3 9.4   Liver Function Tests:  Recent Labs Lab 09/14/15 0848  AST 16  ALT 12*  ALKPHOS 58  BILITOT 1.6*  PROT 6.1*  ALBUMIN 2.7*   CBC:  Recent Labs Lab 09/13/15 1815 09/14/15 0848 09/14/15 1135 09/15/15 0453 09/17/15 0354 09/18/15 0840  WBC  8.6 8.8 8.5  --  7.4 8.4  NEUTROABS  --  5.2  --   --   --   --   HGB 6.8* 8.3* 7.7*  --  7.7* 8.8*  HCT 23.7* 27.9* 25.9* 25.3* 26.4* 30.1*  MCV 90.8 90.6 90.6  --  92.3 92.9  PLT 167 156 147*  --  160 169   Cardiac Enzymes:  Recent Labs Lab 09/14/15 0848 09/14/15 1135  TROPONINI 0.03 0.03   BNP (last 3 results)  Recent Labs  05/03/15 0516 05/25/15 1217 09/13/15 1815  BNP 284.3* 214.5* 280.4*   CBG:  Recent Labs Lab 09/19/15 1206 09/19/15 1605 09/19/15 2144 09/20/15 0554 09/20/15 1228  GLUCAP 200* 307* 143* 144* 165*    Recent Results (from the past 240 hour(s))  Urine culture     Status: None   Collection Time: 09/13/15  8:01 PM  Result Value Ref Range Status   Specimen Description URINE, CLEAN CATCH  Final   Special Requests NONE  Final   Culture >=100,000 COLONIES/mL ESCHERICHIA COLI  Final   Report Status 09/16/2015 FINAL  Final   Organism ID, Bacteria ESCHERICHIA COLI  Final      Susceptibility   Escherichia coli - MIC*    AMPICILLIN <=2 SENSITIVE Sensitive     CEFAZOLIN <=4 SENSITIVE Sensitive     CEFTRIAXONE <=1 SENSITIVE Sensitive     CIPROFLOXACIN <=0.25 SENSITIVE Sensitive     GENTAMICIN <=1 SENSITIVE Sensitive     IMIPENEM <=0.25 SENSITIVE Sensitive     NITROFURANTOIN <=16 SENSITIVE Sensitive     TRIMETH/SULFA <=20 SENSITIVE Sensitive     AMPICILLIN/SULBACTAM <=2 SENSITIVE Sensitive     PIP/TAZO <=4 SENSITIVE Sensitive     * >=100,000 COLONIES/mL ESCHERICHIA COLI  MRSA PCR Screening     Status: None   Collection Time: 09/14/15  1:30 AM  Result Value Ref Range Status   MRSA by PCR NEGATIVE NEGATIVE Final    Comment:        The GeneXpert MRSA Assay (FDA approved for NASAL specimens only), is one component of a comprehensive MRSA colonization surveillance program. It is not intended to diagnose MRSA infection nor to guide or monitor treatment for MRSA infections.      Studies: No results found.  Scheduled Meds: . atorvastatin  20  mg Oral QHS  . ciprofloxacin  500 mg Oral BID  . gabapentin  300 mg Oral BID  . insulin aspart  0-20 Units Subcutaneous TID WC  . insulin aspart  0-5 Units Subcutaneous QHS  . insulin aspart  10 Units Subcutaneous TID WC  . insulin glargine  50 Units Subcutaneous QHS  . metolazone  2.5 mg Oral Q Mon  . potassium chloride SA  20 mEq Oral Q Mon  . rivaroxaban  20 mg Oral Q supper  . sodium chloride  3 mL Intravenous Q12H  . torsemide  40 mg Oral Daily  . [START ON 09/21/2015] torsemide  60 mg Oral Daily   Continuous Infusions:  Principal Problem:   Acute on chronic diastolic CHF (congestive heart failure) (HCC) Active Problems:   HTN (hypertension)   DM (diabetes mellitus), type 2 with renal complications (HCC)   Epistaxis   OSA (obstructive sleep apnea)   Anticoagulant long-term use   Atrial fibrillation with slow ventricular response (HCC)   CKD (chronic kidney disease), stage III   Acute on chronic diastolic heart failure (HCC)   Normocytic anemia   Chronic diastolic CHF (congestive heart failure) (HCC)   On home oxygen therapy   CHF exacerbation (HCC)   Acute blood loss anemia   E. coli UTI   Pamella Pert MD [facility] Pager [phone]. If 7PM-7AM, please contact night-coverage at [email or web address], password [facility] 09/20/2015, 1:39 PM  LOS: 7 days

## 2015-09-20 NOTE — Progress Notes (Signed)
Patient refused cpap for tonight 

## 2015-09-21 LAB — BASIC METABOLIC PANEL
Anion gap: 8 (ref 5–15)
BUN: 42 mg/dL — AB (ref 6–20)
CO2: 43 mmol/L — ABNORMAL HIGH (ref 22–32)
CREATININE: 1.79 mg/dL — AB (ref 0.61–1.24)
Calcium: 9.4 mg/dL (ref 8.9–10.3)
Chloride: 87 mmol/L — ABNORMAL LOW (ref 101–111)
GFR calc Af Amer: 43 mL/min — ABNORMAL LOW (ref >60)
GFR, EST NON AFRICAN AMERICAN: 37 mL/min — AB (ref >60)
GLUCOSE: 176 mg/dL — AB (ref 65–99)
POTASSIUM: 4.2 mmol/L (ref 3.5–5.1)
Sodium: 138 mmol/L (ref 135–145)

## 2015-09-21 LAB — CBC
HEMATOCRIT: 32.5 % — AB (ref 39.0–52.0)
Hemoglobin: 9.8 g/dL — ABNORMAL LOW (ref 13.0–17.0)
MCH: 27.4 pg (ref 26.0–34.0)
MCHC: 30.2 g/dL (ref 30.0–36.0)
MCV: 90.8 fL (ref 78.0–100.0)
PLATELETS: 189 10*3/uL (ref 150–400)
RBC: 3.58 MIL/uL — ABNORMAL LOW (ref 4.22–5.81)
RDW: 16.2 % — AB (ref 11.5–15.5)
WBC: 9.2 10*3/uL (ref 4.0–10.5)

## 2015-09-21 LAB — GLUCOSE, CAPILLARY
GLUCOSE-CAPILLARY: 168 mg/dL — AB (ref 65–99)
GLUCOSE-CAPILLARY: 166 mg/dL — AB (ref 65–99)
GLUCOSE-CAPILLARY: 257 mg/dL — AB (ref 65–99)
Glucose-Capillary: 159 mg/dL — ABNORMAL HIGH (ref 65–99)

## 2015-09-21 MED ORDER — TORSEMIDE 20 MG PO TABS
60.0000 mg | ORAL_TABLET | Freq: Two times a day (BID) | ORAL | Status: DC
  Administered 2015-09-21 – 2015-09-22 (×2): 60 mg via ORAL
  Filled 2015-09-21 (×2): qty 3

## 2015-09-21 NOTE — Progress Notes (Signed)
Pt lying in bed, requested to have me lay his side rail down.  I refused, informing him that I didn't want to risk him falling out of the bed.  Pt became argumentative, stating that "I won't fall out of the bed."  I informed him that I didn't feel comfortable laying his bed side rail down.

## 2015-09-21 NOTE — Care Management Note (Signed)
Case Management Note  Patient Details  Name: Macario GoldsMichael Imhoff MRN: [id number] Date of Birth: 04-09-1946               Action/Plan:  [facility] was active with [facility] but per Tim RN with [facility], they will not accept the patient back due to compliance issues.  [facility] will not accept the patient due to the same issues.  Otelia SanteeFarrah with [facility]( [facility] changed their name) will accept the patient for DM [facility]/HHRN  Expected Discharge Date:  09/21/2015               Expected Discharge Plan:  Home w Home Health Services  Discharge planning Services  CM Consult Choice offered to:  Patient  HH Arranged:  RN, Disease Management, PT HH Agency:  [facility] ( [facility])  Status of Service:  In process, will continue to follow  Medicare Important Message Given:  Yes  Reola MosherChandler, Sharai Overbay L, RN,MHA,BSN [phone] 09/21/2015, 2:13 PM

## 2015-09-21 NOTE — Progress Notes (Signed)
PROGRESS NOTE  Mark Roberson ZOX:[id number] DOB: 01-Aug-1946 DOA: 09/13/2015 PCP: [name], MD  Brief interval history / HPI  69 year old male with a history of persistent atrial fibrillation, diastolic CHF, chronic respiratory failure on 2 L, CKD stage III, diabetes mellitus, hypertension presented with increasing lower extremity edema and epistaxis. The patient states that he has had intermittent epistaxis for many years, and he states that he has seen ENT whom has cauterized and exposed blood vessel in the past. The patient endorses compliance with his medications, but states that he has had dietary indiscretion eating carryout [place] food at least once weekly. He feels that he has gained over 20 pounds in the past 2 months.   Subjective: - continues with flat affect and short yes/no answers but denies specific complaints - endorses low urine output overnight  Assessment/Plan: Acute on chronic diastolic heart failure - Likely secondary to dietary indiscretions in the setting of obstructive sleep apnea. Patient resistant to using C Pap machine.  - Patient is  -17.5 L during this hospitalization. LE edema improved  - Current weight is 302 pounds, down from 329 on admission - renal function stable, continue torsemide 60/40  Escherichia coli UTI - Continue oral Cipro and treat for a total of 7 days, today is day 5  Hypertension - Stable, most recent blood pressure 113/50 - continue current regimen   Well-controlled type 2 diabetes mellitus - Hemoglobin A1c 6.5 on 09/15/2015.  - CBGs Within Goal in the last 24 hours 159/176. No changes in regimen.  Epistaxis - Stable. No further nasal bleeding.  Atrial fibrillation - CHA2DS2VASC score is 6. Patient currently rate controlled now. Epistaxis seem to have resolved. Xarelto has been resumed. Follow.  Chronic kidney disease stage III - Overall stable, hold Lasix today, repeat BMP in the morning  Obstructive sleep  apnea  Acute blood loss anemia Status post 2 units packed red blood cells. H&H stable.     Code Status: DNR Family Communication: Updated patient. No family at bedside.  Disposition Plan: Home when adequately diuresed and ok with cardiology   Consultants:  None  Procedures:  Chest x-ray 09/13/2015  Lower extremity Dopplers 09/15/2015   2 units packed red blood cells 09/14/2015  Antibiotics:  Oral ciprofloxacin 09/16/2015  Objective: Filed Vitals:   09/21/15 0440 09/21/15 1123  BP: 144/61 113/50  Pulse: 64 68  Temp: 98.4 F (36.9 C) 98.7 F (37.1 C)  Resp: 18 18    Intake/Output Summary (Last 24 hours) at 09/21/15 1440 Last data filed at 09/21/15 1300  Gross per 24 hour  Intake   1125 ml  Output   2316 ml  Net  -1191 ml   Filed Weights   09/19/15 0329 09/20/15 0331 09/21/15 0404  Weight: 139.9 kg (308 lb 6.8 oz) 140.071 kg (308 lb 12.8 oz) 137.3 kg (302 lb 11.1 oz)   Exam:   General:  NAD  Cardiovascular: Irregularly irregular  Respiratory: Bibasilar crackles  Abdomen: Soft/NT/ND/+BS  Musculoskeletal: No c/c/. 1-2 + BLE edema  Neuro: non focal   Data Reviewed: Basic Metabolic Panel:  Recent Labs Lab 09/17/15 0354 09/18/15 0538 09/19/15 0739 09/20/15 0412 09/21/15 0540  NA 140 140 139 139 138  K 4.3 4.0 3.7 4.2 4.2  CL 91* 88* 86* 86* 87*  CO2 41* 43* 43* 41* 43*  GLUCOSE 196* 274* 193* 165* 176*  BUN 34* 35* 37* 40* 42*  CREATININE 1.45* 1.59* 1.58* 1.79* 1.79*  CALCIUM 9.0 9.0 9.3 9.4 9.4  Liver Function Tests: No results for input(s): AST, ALT, ALKPHOS, BILITOT, PROT, ALBUMIN in the last 168 hours. CBC:  Recent Labs Lab 09/15/15 0453 09/17/15 0354 09/18/15 0840 09/21/15 0540  WBC  --  7.4 8.4 9.2  HGB  --  7.7* 8.8* 9.8*  HCT 25.3* 26.4* 30.1* 32.5*  MCV  --  92.3 92.9 90.8  PLT  --  160 169 189   Cardiac Enzymes: No results for input(s): CKTOTAL, CKMB, CKMBINDEX, TROPONINI in the last 168 hours. BNP (last 3  results)  Recent Labs  05/03/15 0516 05/25/15 1217 09/13/15 1815  BNP 284.3* 214.5* 280.4*   CBG:  Recent Labs Lab 09/20/15 1228 09/20/15 1653 09/20/15 2016 09/21/15 0549 09/21/15 1133  GLUCAP 165* 172* 183* 168* 159*    Recent Results (from the past 240 hour(s))  Urine culture     Status: None   Collection Time: 09/13/15  8:01 PM  Result Value Ref Range Status   Specimen Description URINE, CLEAN CATCH  Final   Special Requests NONE  Final   Culture >=100,000 COLONIES/mL ESCHERICHIA COLI  Final   Report Status 09/16/2015 FINAL  Final   Organism ID, Bacteria ESCHERICHIA COLI  Final      Susceptibility   Escherichia coli - MIC*    AMPICILLIN <=2 SENSITIVE Sensitive     CEFAZOLIN <=4 SENSITIVE Sensitive     CEFTRIAXONE <=1 SENSITIVE Sensitive     CIPROFLOXACIN <=0.25 SENSITIVE Sensitive     GENTAMICIN <=1 SENSITIVE Sensitive     IMIPENEM <=0.25 SENSITIVE Sensitive     NITROFURANTOIN <=16 SENSITIVE Sensitive     TRIMETH/SULFA <=20 SENSITIVE Sensitive     AMPICILLIN/SULBACTAM <=2 SENSITIVE Sensitive     PIP/TAZO <=4 SENSITIVE Sensitive     * >=100,000 COLONIES/mL ESCHERICHIA COLI  MRSA PCR Screening     Status: None   Collection Time: 09/14/15  1:30 AM  Result Value Ref Range Status   MRSA by PCR NEGATIVE NEGATIVE Final    Comment:        The GeneXpert MRSA Assay (FDA approved for NASAL specimens only), is one component of a comprehensive MRSA colonization surveillance program. It is not intended to diagnose MRSA infection nor to guide or monitor treatment for MRSA infections.      Studies: No results found.  Scheduled Meds: . atorvastatin  20 mg Oral QHS  . ciprofloxacin  500 mg Oral BID  . gabapentin  300 mg Oral BID  . insulin aspart  0-20 Units Subcutaneous TID WC  . insulin aspart  0-5 Units Subcutaneous QHS  . insulin aspart  10 Units Subcutaneous TID WC  . insulin glargine  50 Units Subcutaneous QHS  . metolazone  2.5 mg Oral Q Mon  .  potassium chloride SA  20 mEq Oral Q Mon  . rivaroxaban  20 mg Oral Q supper  . sodium chloride  3 mL Intravenous Q12H  . torsemide  40 mg Oral Daily  . torsemide  60 mg Oral Daily   Continuous Infusions:   Principal Problem:   Acute on chronic diastolic CHF (congestive heart failure) (HCC) Active Problems:   HTN (hypertension)   DM (diabetes mellitus), type 2 with renal complications (HCC)   Epistaxis   OSA (obstructive sleep apnea)   Anticoagulant long-term use   Atrial fibrillation with slow ventricular response (HCC)   CKD (chronic kidney disease), stage III   Acute on chronic diastolic heart failure (HCC)   Normocytic anemia   Chronic diastolic CHF (congestive heart  failure) (HCC)   On home oxygen therapy   CHF exacerbation (HCC)   Acute blood loss anemia   E. coli UTI   Pamella Pert MD [facility] Pager [phone]. If 7PM-7AM, please contact night-coverage at [email or web address], password [email or web address] 09/21/2015, 2:40 PM  LOS: 8 days

## 2015-09-21 NOTE — Progress Notes (Signed)
Advanced Heart Failure Rounding Note  PCP: Dr. Silvio Pate Primary Cardiologist: Dr. Shirlee Latch   Subjective:    Denies SOB or CP. Continues to diuerese.  Willing to follow with HH again to potentially get IV lasix as needed. (had previously been fired for non-compliance)  Out ~3L x 24hrs. Down 6 lbs.   Objective:   Weight Range: 302 lb 11.1 oz (137.3 kg) Body mass index is 41.04 kg/(m^2).   Vital Signs:   Temp:  [98.4 F (36.9 C)-98.7 F (37.1 C)] 98.7 F (37.1 C) (12/29 1123) Pulse Rate:  [63-68] 68 (12/29 1123) Resp:  [18] 18 (12/29 1123) BP: (113-144)/(49-61) 113/50 mmHg (12/29 1123) SpO2:  [100 %] 100 % (12/29 1123) Weight:  [302 lb 11.1 oz (137.3 kg)] 302 lb 11.1 oz (137.3 kg) (12/29 0404) Last BM Date: 09/19/15  Weight change: Filed Weights   09/19/15 0329 09/20/15 0331 09/21/15 0404  Weight: 308 lb 6.8 oz (139.9 kg) 308 lb 12.8 oz (140.071 kg) 302 lb 11.1 oz (137.3 kg)    Intake/Output:   Intake/Output Summary (Last 24 hours) at 09/21/15 1348 Last data filed at 09/21/15 1247  Gross per 24 hour  Intake    885 ml  Output   2491 ml  Net  -1606 ml     Physical Exam: General: Chronically ill appearing. No resp difficulty HEENT: normal Neck: supple. JVP 6-7 cm Carotids 2+ bilat; no bruits. No thyromegaly or lymphadenopathy.  Cor: PMI nondisplaced. Irregular. 1/6 SEM RUSB.  Lungs: Decreased bases. Abdomen: Obese, soft, NT, ND, no HSM. No bruits or masses. +BS Extremities: no cyanosis, clubbing, rash. 1+ ankle edema Neuro: alert & orientedx3, cranial nerves grossly intact. moves all 4 extremities w/o difficulty. Affect pleasant  Telemetry: Reviewed personally, Afib 60s  Labs: CBC  Recent Labs  09/21/15 0540  WBC 9.2  HGB 9.8*  HCT 32.5*  MCV 90.8  PLT 189   Basic Metabolic Panel  Recent Labs  09/20/15 0412 09/21/15 0540  NA 139 138  K 4.2 4.2  CL 86* 87*  CO2 41* 43*  GLUCOSE 165* 176*  BUN 40* 42*  CREATININE 1.79* 1.79*  CALCIUM  9.4 9.4   Liver Function Tests No results for input(s): AST, ALT, ALKPHOS, BILITOT, PROT, ALBUMIN in the last 72 hours. No results for input(s): LIPASE, AMYLASE in the last 72 hours. Cardiac Enzymes No results for input(s): CKTOTAL, CKMB, CKMBINDEX, TROPONINI in the last 72 hours.  BNP: BNP (last 3 results)  Recent Labs  05/03/15 0516 05/25/15 1217 09/13/15 1815  BNP 284.3* 214.5* 280.4*    ProBNP (last 3 results) No results for input(s): PROBNP in the last 8760 hours.   D-Dimer No results for input(s): DDIMER in the last 72 hours. Hemoglobin A1C No results for input(s): HGBA1C in the last 72 hours. Fasting Lipid Panel No results for input(s): CHOL, HDL, LDLCALC, TRIG, CHOLHDL, LDLDIRECT in the last 72 hours. Thyroid Function Tests No results for input(s): TSH, T4TOTAL, T3FREE, THYROIDAB in the last 72 hours.  Invalid input(s): FREET3  Other results:     Imaging/Studies:  No results found.  Latest Echo  Latest Cath   Medications:     Scheduled Medications: . atorvastatin  20 mg Oral QHS  . ciprofloxacin  500 mg Oral BID  . gabapentin  300 mg Oral BID  . insulin aspart  0-20 Units Subcutaneous TID WC  . insulin aspart  0-5 Units Subcutaneous QHS  . insulin aspart  10 Units Subcutaneous TID WC  . insulin  glargine  50 Units Subcutaneous QHS  . metolazone  2.5 mg Oral Q Mon  . potassium chloride SA  20 mEq Oral Q Mon  . rivaroxaban  20 mg Oral Q supper  . sodium chloride  3 mL Intravenous Q12H  . torsemide  40 mg Oral Daily  . torsemide  60 mg Oral Daily    Infusions:    PRN Medications: sodium chloride, acetaminophen, albuterol, ondansetron (ZOFRAN) IV, sodium chloride   Assessment/Plan   Macario GoldsMichael Mcelreath is a 69 y.o. 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   1. Acute on chronic diastolic HF, Echo 09/14/15 EF 95-63%65-70% - Continues to improve on po torsemide. Creatinine stable.  - K stable. - Stressed importance of sodium restriction.  - [facility]. Stressed importance. Will reorder with diuretic protocol for IV lasix as needed. Spoke to case Production designer, theatre/television/filmmanager as well.  2. AF, Persistent - Rate controlled. Failed DCCV 03/2015 - Continue xarelto. No further nosebleeds. 3. Anemia - Hgb stable s/p 2 UPRBCs.  - Per primary team.  4. AKI on CKD stage III - Creatinine stable. 5. OSA/suspected OHS - Refuses CPAP.  6. CAD - Stable. No symptoms - Continue statin. On Xarelto. No ASA needed.  Length of Stay: 8  Graciella FreerMichael Andrew Tillery PA-C 09/21/2015, 1:48 PM  Advanced Heart Failure Team Pager [phone] (M-F; 7a - 4p)  Please contact [facility] for night-coverage after hours (4p -7a ) and weekends on [email or web address]  Patient seen with PA, agree with the above note.  He looks euvolemic now.  Would send home on torsemide 60 mg bid. Can continue metolazone once a week as prior to admission.  Will arrange for home health so that he can get IV Lasix at home if needed.   Marca AnconaDalton Devanta Daniel 09/21/2015 4:26 PM

## 2015-09-21 NOTE — Progress Notes (Signed)
Pt refusing CPAP for tonight 

## 2015-09-22 ENCOUNTER — Ambulatory Visit: Payer: Medicare Other | Admitting: Internal Medicine

## 2015-09-22 DIAGNOSIS — G4733 Obstructive sleep apnea (adult) (pediatric): Secondary | ICD-10-CM

## 2015-09-22 DIAGNOSIS — D62 Acute posthemorrhagic anemia: Secondary | ICD-10-CM

## 2015-09-22 DIAGNOSIS — I10 Essential (primary) hypertension: Secondary | ICD-10-CM

## 2015-09-22 DIAGNOSIS — D649 Anemia, unspecified: Secondary | ICD-10-CM

## 2015-09-22 LAB — GLUCOSE, CAPILLARY
GLUCOSE-CAPILLARY: 191 mg/dL — AB (ref 65–99)
Glucose-Capillary: 171 mg/dL — ABNORMAL HIGH (ref 65–99)

## 2015-09-22 LAB — BASIC METABOLIC PANEL
ANION GAP: 11 (ref 5–15)
BUN: 43 mg/dL — ABNORMAL HIGH (ref 6–20)
CALCIUM: 9.1 mg/dL (ref 8.9–10.3)
CHLORIDE: 88 mmol/L — AB (ref 101–111)
CO2: 40 mmol/L — AB (ref 22–32)
CREATININE: 1.89 mg/dL — AB (ref 0.61–1.24)
GFR calc Af Amer: 40 mL/min — ABNORMAL LOW (ref >60)
GFR calc non Af Amer: 35 mL/min — ABNORMAL LOW (ref >60)
GLUCOSE: 167 mg/dL — AB (ref 65–99)
Potassium: 3.7 mmol/L (ref 3.5–5.1)
Sodium: 139 mmol/L (ref 135–145)

## 2015-09-22 MED ORDER — POTASSIUM CHLORIDE CRYS ER 20 MEQ PO TBCR: 20.0000 meq | EXTENDED_RELEASE_TABLET | Freq: Every day | ORAL | Status: DC

## 2015-09-22 MED ORDER — TORSEMIDE 20 MG PO TABS: 60.0000 mg | ORAL_TABLET | Freq: Two times a day (BID) | ORAL | Status: DC

## 2015-09-22 MED ORDER — POTASSIUM CHLORIDE CRYS ER 20 MEQ PO TBCR
20.0000 meq | EXTENDED_RELEASE_TABLET | Freq: Once | ORAL | Status: AC
  Administered 2015-09-22: 20 meq via ORAL
  Filled 2015-09-22: qty 1

## 2015-09-22 NOTE — Progress Notes (Signed)
[facility] made aware of discharge for today and BM[facility] - fax # [phone]; Abelino DerrickB Irais Mottram [facility] CenterRN,MHA,BSN [phone]

## 2015-09-22 NOTE — Discharge Summary (Signed)
Physician Discharge Summary  Mark GoldsMichael Roberson ZOX:[id number] DOB: 1946/02/14 DOA: 09/13/2015  PCP: Myrlene BrokerElizabeth A Crawford, MD  Admit date: 09/13/2015 Discharge date: 09/22/2015  Time spent: > 30 minutes  Recommendations for Outpatient Follow-up:  1. Follow-up with Dr. Shirlee LatchMcLean in heart failure clinic as scheduled   Discharge Diagnoses:  Principal Problem:   Acute on chronic diastolic CHF (congestive heart failure) (HCC) Active Problems:   HTN (hypertension)   DM (diabetes mellitus), type 2 with renal complications (HCC)   Epistaxis   OSA (obstructive sleep apnea)   Anticoagulant long-term use   Atrial fibrillation with slow ventricular response (HCC)   CKD (chronic kidney disease), stage III   Acute on chronic diastolic heart failure (HCC)   Normocytic anemia   Chronic diastolic CHF (congestive heart failure) (HCC)   On home oxygen therapy   CHF exacerbation (HCC)   Acute blood loss anemia   E. coli UTI  Discharge Condition: stable  Diet recommendation: heart healthy  Filed Weights   09/22/15 0634 09/22/15 1200 09/22/15 1217  Weight: 139.526 kg (307 lb 9.6 oz) 139.708 kg (308 lb) 139.708 kg (308 lb)   History of present illness:  See H&P, Labs, Consult and Test reports for all details in brief, patient is a 69 year old male with a history of persistent atrial fibrillation, diastolic CHF, chronic respiratory failure on 2 L, CKD stage III, diabetes mellitus, hypertension presented with increasing lower extremity edema and epistaxis  Hospital Course:  Acute on chronic diastolic heart failure - Likely secondary to dietary indiscretions in the setting of obstructive sleep apnea. Patient resistant to using C Pap machine and has continued to refuse using a CPAP machine while hospitalized. Cardiology (heart failure team) has been consulted and followed patient while hospitalized. He was aggressively diuresed and was net -17.9 L and his weight has improved from 329 pounds on admission  to 308 pounds on discharge. Patient's regimen was changed to torsemide 60 mg twice a day per cardiology, he was stable on this regimen will hospitalized and he was discharged home in stable condition. He is close outpatient follow-up in the heart failure clinic. His renal function has been overall stable with diuresis.  Escherichia coli UTI - patient received a full treatment with ciprofloxacin will hospitalized based on the sensitivities. Hypertension - Stable, resume home regimen Well-controlled type 2 diabetes mellitus - Hemoglobin A1c 6.5 on 09/15/2015.  Epistaxis - Stable. No further nasal bleeding. Atrial fibrillation - CHA2DS2VASC score is 6. Patient currently rate controlled now. Epistaxis seem to have resolved. Xarelto has been resumed. Chronic kidney disease stage III - Overall stable, will need repeat renal function in heart failure clinic follow-up Obstructive sleep apnea - non-compliant with his CPAP machine  Acute blood loss anemia - Status post 2 units packed red blood cells. H&H stable.   Procedures:  2D echo Impressions: Technically difficult; definity used; Vigorous LV function;elevated LV filliing pressure; biatrial enlargement; mild MR andTR; mildly elevated pulmonary pressure.   Consultations:  Cardiology   Discharge Exam: Filed Vitals:   09/22/15 [id number] 09/22/15 1131 09/22/15 1200 09/22/15 1217  BP:  128/53    Pulse:  64    Temp:  98.4 F (36.9 C)    TempSrc:  Oral    Resp:  18    Height:    6' (1.829 m)  Weight: 139.526 kg (307 lb 9.6 oz)  139.708 kg (308 lb) 139.708 kg (308 lb)  SpO2:  99%     General: NAD Cardiovascular: RRR Respiratory: CTA biL  Discharge Instructions Activity:  As tolerated   Get Medicines reviewed and adjusted: Please take all your medications with you for your next visit with your Primary MD  Please request your Primary MD to go over all hospital tests and procedure/radiological results at the follow up, please ask your  Primary MD to get all Hospital records sent to his/her office.  If you experience worsening of your admission symptoms, develop shortness of breath, life threatening emergency, suicidal or homicidal thoughts you must seek medical attention immediately by calling 911 or calling your MD immediately if symptoms less severe.  You must read complete instructions/literature along with all the possible adverse reactions/side effects for all the Medicines you take and that have been prescribed to you. Take any new Medicines after you have completely understood and accpet all the possible adverse reactions/side effects.   Do not drive when taking Pain medications.   Do not take more than prescribed Pain, Sleep and Anxiety Medications  Special Instructions: If you have smoked or chewed Tobacco in the last 2 yrs please stop smoking, stop any regular Alcohol and or any Recreational drug use.  Wear Seat belts while driving.  Please note  You were cared for by a hospitalist during your hospital stay. Once you are discharged, your primary care physician will handle any further medical issues. Please note that NO REFILLS for any discharge medications will be authorized once you are discharged, as it is imperative that you return to your primary care physician (or establish a relationship with a primary care physician if you do not have one) for your aftercare needs so that they can reassess your need for medications and monitor your lab values.    Medication List    TAKE these medications        acetaminophen 500 MG tablet  Commonly known as:  TYLENOL  Take 500 mg by mouth every 4 (four) hours as needed (pain). Do not exceed 4 gms of tylenol in 24 hours     albuterol (2.5 MG/3ML) 0.083% nebulizer solution  Commonly known as:  PROVENTIL  Take 3 mLs (2.5 mg total) by nebulization every 6 (six) hours as needed for wheezing or shortness of breath.     PROAIR HFA 108 (90 Base) MCG/ACT inhaler  Generic  drug:  albuterol  INHALE 1 TO 2 PUFFS BY MOUTH FOUR TIMES DAILY AS DIRECTED AS NEEDED FOR WHEEZING AND SHORTNESS OF BREATH     atorvastatin 20 MG tablet  Commonly known as:  LIPITOR  Take 20 mg by mouth at bedtime.     cholecalciferol 1000 units tablet  Commonly known as:  VITAMIN D  Take 1,000 Units by mouth daily.     Fish Oil 1000 MG Caps  Take 2,000 mg by mouth daily.     gabapentin 300 MG capsule  Commonly known as:  NEURONTIN  Take 300 mg by mouth 2 (two) times daily.     HUMALOG KWIKPEN 100 UNIT/ML KiwkPen  Generic drug:  insulin lispro  Inject 3 time daily just before each meal 45-35-45     LANTUS SOLOSTAR 100 UNIT/ML Solostar Pen  Generic drug:  Insulin Glargine  Inject 40 Units into the skin at bedtime.     levothyroxine 25 MCG tablet  Commonly known as:  SYNTHROID, LEVOTHROID  TAKE 1 TABLET BY MOUTH EVERY MORNING     Magnesium 400 MG Caps  Take 400 mg by mouth daily.     metolazone 2.5 MG tablet  Commonly known as:  ZAROXOLYN  Take 1 tablet (2.5 mg total) by mouth 2 (two) times a week. Every Mon and Fri     niacin 250 MG tablet  Take 250 mg by mouth daily.     potassium chloride SA 20 MEQ tablet  Commonly known as:  K-DUR,KLOR-CON  Take 1 tablet (20 mEq total) by mouth daily.     Rivaroxaban 15 MG Tabs tablet  Commonly known as:  XARELTO  Take 1 tablet (15 mg total) by mouth daily with breakfast.     torsemide 20 MG tablet  Commonly known as:  DEMADEX  Take 3 tablets (60 mg total) by mouth 2 (two) times daily.           Follow-up Information    Follow up with [facility].   Specialty:  Home Health Services   Why:  Their new name is [facility]. Joen Laura will provide a home health nurse that will come to your home   Contact information:   [address] [place] [address] [phone]       Follow up with [facility] On 10/03/2015.   Specialty:  Cardiology   Why:  at 0900 for post hospital  follow up. Please bring all medications to your visit. The code for pt parking is 1000.   Contact information:   [address] [id number] mc [place] [address] [phone]      Follow up with [name], MD. Schedule an appointment as soon as possible for a visit in 1 month.   Specialty:  Internal Medicine   Contact information:   [address] [place] [address] [phone]       The results of significant diagnostics from this hospitalization (including imaging, microbiology, ancillary and laboratory) are listed below for reference.    Significant Diagnostic Studies: Dg Chest 2 View  09/13/2015  CLINICAL DATA:  Shortness of breath for 1 day.  CHF. EXAM: CHEST  2 VIEW COMPARISON:  05/02/2015 FINDINGS: There is mild bilateral interstitial thickening. There is no focal parenchymal opacity. There is no pleural effusion or pneumothorax. There is stable cardiomegaly. The osseous structures are unremarkable. IMPRESSION: Stable cardiomegaly with mild pulmonary vascular congestion. Electronically Signed   By: Elige Ko   On: 09/13/2015 17:53    Microbiology: Recent Results (from the past 240 hour(s))  Urine culture     Status: None   Collection Time: 09/13/15  8:01 PM  Result Value Ref Range Status   Specimen Description URINE, CLEAN CATCH  Final   Special Requests NONE  Final   Culture >=100,000 COLONIES/mL ESCHERICHIA COLI  Final   Report Status 09/16/2015 FINAL  Final   Organism ID, Bacteria ESCHERICHIA COLI  Final      Susceptibility   Escherichia coli - MIC*    AMPICILLIN <=2 SENSITIVE Sensitive     CEFAZOLIN <=4 SENSITIVE Sensitive     CEFTRIAXONE <=1 SENSITIVE Sensitive     CIPROFLOXACIN <=0.25 SENSITIVE Sensitive     GENTAMICIN <=1 SENSITIVE Sensitive     IMIPENEM <=0.25 SENSITIVE Sensitive     NITROFURANTOIN <=16 SENSITIVE Sensitive     TRIMETH/SULFA <=20 SENSITIVE Sensitive     AMPICILLIN/SULBACTAM <=2 SENSITIVE Sensitive      PIP/TAZO <=4 SENSITIVE Sensitive     * >=100,000 COLONIES/mL ESCHERICHIA COLI  MRSA PCR Screening     Status: None   Collection Time: 09/14/15  1:30 AM  Result Value Ref Range Status   MRSA by  PCR NEGATIVE NEGATIVE Final    Comment:        The GeneXpert MRSA Assay (FDA approved for NASAL specimens only), is one component of a comprehensive MRSA colonization surveillance program. It is not intended to diagnose MRSA infection nor to guide or monitor treatment for MRSA infections.      Labs: Basic Metabolic Panel:  Recent Labs Lab 09/16/15 0317 09/17/15 0354 09/18/15 0538 09/19/15 0739 09/20/15 0412 09/21/15 0540 09/22/15 0558  NA 139 140 140 139 139 138 139  K 4.0 4.3 4.0 3.7 4.2 4.2 3.7  CL 93* 91* 88* 86* 86* 87* 88*  CO2 39* 41* 43* 43* 41* 43* 40*  GLUCOSE 244* 196* 274* 193* 165* 176* 167*  BUN 33* 34* 35* 37* 40* 42* 43*  CREATININE 1.44* 1.45* 1.59* 1.58* 1.79* 1.79* 1.89*  CALCIUM 8.8* 9.0 9.0 9.3 9.4 9.4 9.1   CBC:  Recent Labs Lab 09/17/15 0354 09/18/15 0840 09/21/15 0540  WBC 7.4 8.4 9.2  HGB 7.7* 8.8* 9.8*  HCT 26.4* 30.1* 32.5*  MCV 92.3 92.9 90.8  PLT 160 169 189   BNP: BNP (last 3 results)  Recent Labs  05/03/15 0516 05/25/15 1217 09/13/15 1815  BNP 284.3* 214.5* 280.4*    CBG:  Recent Labs Lab 09/21/15 1133 09/21/15 1630 09/21/15 2130 09/22/15 0630 09/22/15 1119  GLUCAP 159* 166* 257* 171* 191*     Signed:  GHERGHE, COSTIN  [facility] 09/22/2015, 3:06 PM

## 2015-09-22 NOTE — Progress Notes (Signed)
Orders received for pt discharge.  Discharge summary printed and reviewed with pt.  Explained medication regimen, and pt had no further questions at this time.  IV removed and site remains clean, dry, intact.  Telemetry removed.  Pt in stable condition and awaiting transport. 

## 2015-09-22 NOTE — Care Management Important Message (Signed)
Important Message  Patient Details  Name: Macario GoldsMichael Ridge MRN: [id number] Date of Birth: 17-Mar-1946   Medicare Important Message Given:  Yes    Daniyla Pfahler P Lizvet Chunn 09/22/2015, 2:02 PM

## 2015-09-22 NOTE — Progress Notes (Signed)
Advanced Heart Failure Rounding Note  PCP: Dr. Silvio Pate Primary Cardiologist: Dr. Shirlee Latch   Subjective:    No complaints today. Denies CP, SOB, lightheadedness, dizziness, or palpitations.  Affect flat.   Weight shows up 5 lbs despite negative output and improved volume status on exam. Will reweigh standing.   Objective:   Weight Range: 307 lb 9.6 oz (139.526 kg) Body mass index is 41.71 kg/(m^2).   Vital Signs:   Temp:  [98.2 F (36.8 C)-98.9 F (37.2 C)] 98.4 F (36.9 C) (12/30 1131) Pulse Rate:  [64-68] 64 (12/30 1131) Resp:  [18-19] 18 (12/30 1131) BP: (122-134)/(50-58) 128/53 mmHg (12/30 1131) SpO2:  [93 %-100 %] 99 % (12/30 1131) Weight:  [307 lb 9.6 oz (139.526 kg)] 307 lb 9.6 oz (139.526 kg) (12/30 0634) Last BM Date: 09/20/15  Weight change: Filed Weights   09/20/15 0331 09/21/15 0404 09/22/15 0634  Weight: 308 lb 12.8 oz (140.071 kg) 302 lb 11.1 oz (137.3 kg) 307 lb 9.6 oz (139.526 kg)    Intake/Output:   Intake/Output Summary (Last 24 hours) at 09/22/15 1202 Last data filed at 09/22/15 1131  Gross per 24 hour  Intake   1328 ml  Output   2475 ml  Net  -1147 ml     Physical Exam: General: Chronically ill appearing. No resp difficulty HEENT: normal Neck: supple. JVP 6-7cm with very mild HFR. Carotids 2+ bilat; no bruits. No thyromegaly or nodule noted..  Cor: PMI nondisplaced. Irregular. 1/6 SEM RUSB.  Lungs: Decreased bases. Abdomen: Obese, soft, nontender, non distended, no HSM. No bruits or masses. +BS Extremities: no cyanosis, clubbing, rash. Trace-1+ ankle edema Neuro: alert & orientedx3, cranial nerves grossly intact. moves all 4 extremities w/o difficulty. Affect flat  Telemetry: Reviewed personally, Remains Afib 60s  Labs: CBC  Recent Labs  09/21/15 0540  WBC 9.2  HGB 9.8*  HCT 32.5*  MCV 90.8  PLT 189   Basic Metabolic Panel  Recent Labs  09/21/15 0540 09/22/15 0558  NA 138 139  K 4.2 3.7  CL 87* 88*  CO2 43* 40*   GLUCOSE 176* 167*  BUN 42* 43*  CREATININE 1.79* 1.89*  CALCIUM 9.4 9.1   Liver Function Tests No results for input(s): AST, ALT, ALKPHOS, BILITOT, PROT, ALBUMIN in the last 72 hours. No results for input(s): LIPASE, AMYLASE in the last 72 hours. Cardiac Enzymes No results for input(s): CKTOTAL, CKMB, CKMBINDEX, TROPONINI in the last 72 hours.  BNP: BNP (last 3 results)  Recent Labs  05/03/15 0516 05/25/15 1217 09/13/15 1815  BNP 284.3* 214.5* 280.4*    ProBNP (last 3 results) No results for input(s): PROBNP in the last 8760 hours.   D-Dimer No results for input(s): DDIMER in the last 72 hours. Hemoglobin A1C No results for input(s): HGBA1C in the last 72 hours. Fasting Lipid Panel No results for input(s): CHOL, HDL, LDLCALC, TRIG, CHOLHDL, LDLDIRECT in the last 72 hours. Thyroid Function Tests No results for input(s): TSH, T4TOTAL, T3FREE, THYROIDAB in the last 72 hours.  Invalid input(s): FREET3  Other results:     Imaging/Studies:  No results found.  Latest Echo  Latest Cath   Medications:     Scheduled Medications: . atorvastatin  20 mg Oral QHS  . ciprofloxacin  500 mg Oral BID  . gabapentin  300 mg Oral BID  . insulin aspart  0-20 Units Subcutaneous TID WC  . insulin aspart  0-5 Units Subcutaneous QHS  . insulin aspart  10 Units Subcutaneous TID WC  .  insulin glargine  50 Units Subcutaneous QHS  . metolazone  2.5 mg Oral Q Mon  . potassium chloride SA  20 mEq Oral Q Mon  . rivaroxaban  20 mg Oral Q supper  . sodium chloride  3 mL Intravenous Q12H  . torsemide  60 mg Oral BID    Infusions:    PRN Medications: sodium chloride, acetaminophen, albuterol, ondansetron (ZOFRAN) IV, sodium chloride   Assessment/Plan   Mark Roberson is a 69 y.o. male with history of OSA and suspected OHS on home 02, Chronic diastolic CHF Echo 09/14/15 EF 16-10%65-70% with high filling pressures and "mildly increased pa pressures", chronic afib, and CAD admitted  09/13/15 with volume overload, epistaxis, and anemia.   1. Acute on chronic diastolic HF, Echo 09/14/15 EF 96-04%65-70% - Volume status stable on po torsemide. Out 14 L and down ~ 20 lbs this admission.  - K 3.7. Will supp prior to d/c. Will speak to CM about [facility] and fax to [phone]. - Stressed importance of sodium restriction.  - [facility]) Pt enrolled with [facility] ([facility]viously [facility] Stressed importance of compliance. Diuretic protocol ordered. 2. AF, Persistent - Rate controlled. Failed DCCV 03/2015 - Continue Xarelto. No bleeding 3. Anemia - Hgb stable at 9.8 09/21/15 s/p 2 UPRBCs.  - Per primary team.  4. AKI on CKD stage III - Creatinine up slightly but relatively stable. 5. OSA/suspected OHS - Refuses CPAP.  6. CAD - Stable. No symptoms - Continue statin. On Xarelto. No ASA needed.  Edit: Weight 308 on re-reweigh. (has had breakfast and lunch). Likely 302 was erroneous.  Weight stable.  Length of Stay: [address]  Graciella FreerMichael Andrew Tillery PA-C 09/22/2015, 12:02 PM  Advanced Heart Failure Team Pager [phone] (M-F; 7a - 4p)  Please contact [facility] for night-coverage after hours (4p -7a ) and weekends on [email or web address]  Patient seen with PA, agree with the above note.  Weight remaining stable on po torsemide regimen.  Would send home on torsemide 60 mg bid with KCl 20 daily.  He has followup arranged in the CHF clinic.   Marca AnconaDalton McLean 09/22/2015 12:51 PM

## 2015-09-29 ENCOUNTER — Ambulatory Visit: Payer: Self-pay | Admitting: Endocrinology

## 2015-10-02 ENCOUNTER — Other Ambulatory Visit: Payer: Self-pay | Admitting: Adult Health

## 2015-10-03 ENCOUNTER — Telehealth: Payer: Self-pay | Admitting: Endocrinology

## 2015-10-03 ENCOUNTER — Encounter (HOSPITAL_COMMUNITY): Payer: Self-pay

## 2015-10-03 NOTE — Telephone Encounter (Signed)
please call patient: Ov is due 

## 2015-10-04 NOTE — Telephone Encounter (Signed)
Appointment letter mailed to the pt.  

## 2015-10-10 ENCOUNTER — Encounter (HOSPITAL_COMMUNITY): Payer: Self-pay

## 2015-10-13 ENCOUNTER — Other Ambulatory Visit: Payer: Self-pay | Admitting: Adult Health

## 2015-10-18 ENCOUNTER — Observation Stay (HOSPITAL_COMMUNITY)
Admission: EM | Admit: 2015-10-18 | Discharge: 2015-10-21 | Disposition: A | Discharge status: Home / Self Care | Payer: Medicare Other | Attending: Internal Medicine | Admitting: Internal Medicine

## 2015-10-18 ENCOUNTER — Encounter (HOSPITAL_COMMUNITY): Payer: Self-pay | Admitting: Emergency Medicine

## 2015-10-18 ENCOUNTER — Observation Stay (HOSPITAL_COMMUNITY): Payer: Medicare Other

## 2015-10-18 DIAGNOSIS — D649 Anemia, unspecified: Secondary | ICD-10-CM | POA: Insufficient documentation

## 2015-10-18 DIAGNOSIS — N183 Chronic kidney disease, stage 3 (moderate): Secondary | ICD-10-CM | POA: Insufficient documentation

## 2015-10-18 DIAGNOSIS — E1129 Type 2 diabetes mellitus with other diabetic kidney complication: Secondary | ICD-10-CM | POA: Diagnosis present

## 2015-10-18 DIAGNOSIS — R5383 Other fatigue: Secondary | ICD-10-CM | POA: Diagnosis not present

## 2015-10-18 DIAGNOSIS — N179 Acute kidney failure, unspecified: Principal | ICD-10-CM | POA: Insufficient documentation

## 2015-10-18 DIAGNOSIS — Z794 Long term (current) use of insulin: Secondary | ICD-10-CM | POA: Diagnosis not present

## 2015-10-18 DIAGNOSIS — I5032 Chronic diastolic (congestive) heart failure: Secondary | ICD-10-CM | POA: Diagnosis present

## 2015-10-18 DIAGNOSIS — G4730 Sleep apnea, unspecified: Secondary | ICD-10-CM | POA: Insufficient documentation

## 2015-10-18 DIAGNOSIS — Z79899 Other long term (current) drug therapy: Secondary | ICD-10-CM | POA: Insufficient documentation

## 2015-10-18 DIAGNOSIS — N189 Chronic kidney disease, unspecified: Secondary | ICD-10-CM | POA: Diagnosis not present

## 2015-10-18 DIAGNOSIS — I129 Hypertensive chronic kidney disease with stage 1 through stage 4 chronic kidney disease, or unspecified chronic kidney disease: Secondary | ICD-10-CM | POA: Diagnosis not present

## 2015-10-18 DIAGNOSIS — E1121 Type 2 diabetes mellitus with diabetic nephropathy: Secondary | ICD-10-CM

## 2015-10-18 DIAGNOSIS — E039 Hypothyroidism, unspecified: Secondary | ICD-10-CM | POA: Diagnosis present

## 2015-10-18 DIAGNOSIS — M542 Cervicalgia: Secondary | ICD-10-CM | POA: Diagnosis present

## 2015-10-18 DIAGNOSIS — J45909 Unspecified asthma, uncomplicated: Secondary | ICD-10-CM | POA: Diagnosis not present

## 2015-10-18 DIAGNOSIS — I509 Heart failure, unspecified: Secondary | ICD-10-CM | POA: Insufficient documentation

## 2015-10-18 DIAGNOSIS — Z7901 Long term (current) use of anticoagulants: Secondary | ICD-10-CM | POA: Diagnosis not present

## 2015-10-18 DIAGNOSIS — G8929 Other chronic pain: Secondary | ICD-10-CM | POA: Diagnosis not present

## 2015-10-18 DIAGNOSIS — I4891 Unspecified atrial fibrillation: Secondary | ICD-10-CM | POA: Insufficient documentation

## 2015-10-18 DIAGNOSIS — I5022 Chronic systolic (congestive) heart failure: Secondary | ICD-10-CM

## 2015-10-18 DIAGNOSIS — Z880 Allergy status to penicillin: Secondary | ICD-10-CM | POA: Diagnosis not present

## 2015-10-18 DIAGNOSIS — I10 Essential (primary) hypertension: Secondary | ICD-10-CM | POA: Diagnosis present

## 2015-10-18 LAB — CBC WITH DIFFERENTIAL/PLATELET
BASOS PCT: 0 %
Basophils Absolute: 0 10*3/uL (ref 0.0–0.1)
Eosinophils Absolute: 0.3 10*3/uL (ref 0.0–0.7)
Eosinophils Relative: 4 %
HEMATOCRIT: 33.7 % — AB (ref 39.0–52.0)
HEMOGLOBIN: 10.2 g/dL — AB (ref 13.0–17.0)
LYMPHS ABS: 1.1 10*3/uL (ref 0.7–4.0)
LYMPHS PCT: 16 %
MCH: 26.6 pg (ref 26.0–34.0)
MCHC: 30.3 g/dL (ref 30.0–36.0)
MCV: 87.8 fL (ref 78.0–100.0)
MONO ABS: 0.8 10*3/uL (ref 0.1–1.0)
MONOS PCT: 11 %
NEUTROS ABS: 5.1 10*3/uL (ref 1.7–7.7)
NEUTROS PCT: 69 %
Platelets: 197 10*3/uL (ref 150–400)
RBC: 3.84 MIL/uL — ABNORMAL LOW (ref 4.22–5.81)
RDW: 16 % — AB (ref 11.5–15.5)
WBC: 7.3 10*3/uL (ref 4.0–10.5)

## 2015-10-18 LAB — BASIC METABOLIC PANEL
Anion gap: 13 (ref 5–15)
BUN: 79 mg/dL — ABNORMAL HIGH (ref 6–20)
CHLORIDE: 91 mmol/L — AB (ref 101–111)
CO2: 31 mmol/L (ref 22–32)
Calcium: 9.6 mg/dL (ref 8.9–10.3)
Creatinine, Ser: 2.99 mg/dL — ABNORMAL HIGH (ref 0.61–1.24)
GFR calc non Af Amer: 20 mL/min — ABNORMAL LOW (ref >60)
GFR, EST AFRICAN AMERICAN: 23 mL/min — AB (ref >60)
GLUCOSE: 216 mg/dL — AB (ref 65–99)
Potassium: 3.8 mmol/L (ref 3.5–5.1)
Sodium: 135 mmol/L (ref 135–145)

## 2015-10-18 LAB — GLUCOSE, CAPILLARY: GLUCOSE-CAPILLARY: 188 mg/dL — AB (ref 65–99)

## 2015-10-18 MED ORDER — SODIUM CHLORIDE 0.9% FLUSH
3.0000 mL | Freq: Two times a day (BID) | INTRAVENOUS | Status: DC
  Administered 2015-10-19 – 2015-10-21 (×5): 3 mL via INTRAVENOUS

## 2015-10-18 MED ORDER — ACETAMINOPHEN 325 MG PO TABS: 650.0000 mg | ORAL_TABLET | Freq: Four times a day (QID) | ORAL | Status: DC | PRN

## 2015-10-18 MED ORDER — GABAPENTIN 300 MG PO CAPS
300.0000 mg | ORAL_CAPSULE | Freq: Two times a day (BID) | ORAL | Status: DC
Start: 2015-10-19 — End: 2015-10-21
  Administered 2015-10-19 – 2015-10-21 (×5): 300 mg via ORAL
  Filled 2015-10-18 (×5): qty 1

## 2015-10-18 MED ORDER — ONDANSETRON HCL 4 MG PO TABS: 4.0000 mg | ORAL_TABLET | Freq: Four times a day (QID) | ORAL | Status: DC | PRN

## 2015-10-18 MED ORDER — ACETAMINOPHEN 650 MG RE SUPP: 650.0000 mg | Freq: Four times a day (QID) | RECTAL | Status: DC | PRN

## 2015-10-18 MED ORDER — INSULIN GLARGINE 100 UNIT/ML SOLOSTAR PEN: 40.0000 [IU] | PEN_INJECTOR | Freq: Every day | SUBCUTANEOUS | Status: DC

## 2015-10-18 MED ORDER — TORSEMIDE 20 MG PO TABS
40.0000 mg | ORAL_TABLET | Freq: Two times a day (BID) | ORAL | Status: DC
  Administered 2015-10-19 – 2015-10-20 (×3): 40 mg via ORAL
  Filled 2015-10-18 (×3): qty 2

## 2015-10-18 MED ORDER — SODIUM CHLORIDE 0.9 % IV BOLUS (SEPSIS)
500.0000 mL | Freq: Once | INTRAVENOUS | Status: AC
  Administered 2015-10-18: 500 mL via INTRAVENOUS

## 2015-10-18 MED ORDER — ALBUTEROL SULFATE HFA 108 (90 BASE) MCG/ACT IN AERS: 2.0000 | INHALATION_SPRAY | RESPIRATORY_TRACT | Status: DC | PRN

## 2015-10-18 MED ORDER — OMEGA-3-ACID ETHYL ESTERS 1 G PO CAPS
2000.0000 mg | ORAL_CAPSULE | Freq: Every day | ORAL | Status: DC
Start: 2015-10-19 — End: 2015-10-21
  Administered 2015-10-19 – 2015-10-21 (×3): 2000 mg via ORAL
  Filled 2015-10-18 (×4): qty 2

## 2015-10-18 MED ORDER — CETYLPYRIDINIUM CHLORIDE 0.05 % MT LIQD
7.0000 mL | Freq: Two times a day (BID) | OROMUCOSAL | Status: DC
  Administered 2015-10-19 – 2015-10-21 (×5): 7 mL via OROMUCOSAL

## 2015-10-18 MED ORDER — ATORVASTATIN CALCIUM 20 MG PO TABS
20.0000 mg | ORAL_TABLET | Freq: Every day | ORAL | Status: DC
  Administered 2015-10-19 – 2015-10-20 (×2): 20 mg via ORAL
  Filled 2015-10-18 (×2): qty 1

## 2015-10-18 MED ORDER — VITAMIN D 1000 UNITS PO TABS
1000.0000 [IU] | ORAL_TABLET | Freq: Every day | ORAL | Status: DC
  Administered 2015-10-19 – 2015-10-21 (×3): 1000 [IU] via ORAL
  Filled 2015-10-18 (×3): qty 1

## 2015-10-18 MED ORDER — POTASSIUM CHLORIDE CRYS ER 20 MEQ PO TBCR
20.0000 meq | EXTENDED_RELEASE_TABLET | ORAL | Status: DC
  Administered 2015-10-20: 20 meq via ORAL
  Filled 2015-10-18 (×2): qty 1

## 2015-10-18 MED ORDER — ALBUTEROL SULFATE (2.5 MG/3ML) 0.083% IN NEBU: 2.5000 mg | INHALATION_SOLUTION | Freq: Four times a day (QID) | RESPIRATORY_TRACT | Status: DC | PRN

## 2015-10-18 MED ORDER — RIVAROXABAN 15 MG PO TABS
15.0000 mg | ORAL_TABLET | Freq: Every day | ORAL | Status: DC
  Administered 2015-10-19 – 2015-10-21 (×3): 15 mg via ORAL
  Filled 2015-10-18 (×3): qty 1

## 2015-10-18 MED ORDER — INSULIN ASPART 100 UNIT/ML ~~LOC~~ SOLN
0.0000 [IU] | Freq: Three times a day (TID) | SUBCUTANEOUS | Status: DC
  Administered 2015-10-19: 4 [IU] via SUBCUTANEOUS
  Administered 2015-10-19: 11 [IU] via SUBCUTANEOUS
  Administered 2015-10-19 – 2015-10-20 (×3): 4 [IU] via SUBCUTANEOUS
  Administered 2015-10-20: 20 [IU] via SUBCUTANEOUS
  Administered 2015-10-21: 4 [IU] via SUBCUTANEOUS
  Administered 2015-10-21: 11 [IU] via SUBCUTANEOUS
  Administered 2015-10-21: 4 [IU] via SUBCUTANEOUS

## 2015-10-18 MED ORDER — NIACIN 250 MG PO TABS
250.0000 mg | ORAL_TABLET | Freq: Every day | ORAL | Status: DC
  Administered 2015-10-20 – 2015-10-21 (×2): 250 mg via ORAL
  Filled 2015-10-18 (×4): qty 1

## 2015-10-18 MED ORDER — ONDANSETRON HCL 4 MG/2ML IJ SOLN: 4.0000 mg | Freq: Four times a day (QID) | INTRAMUSCULAR | Status: DC | PRN

## 2015-10-18 MED ORDER — MAGNESIUM OXIDE 400 (241.3 MG) MG PO TABS
400.0000 mg | ORAL_TABLET | Freq: Every day | ORAL | Status: DC
  Administered 2015-10-19 – 2015-10-21 (×3): 400 mg via ORAL
  Filled 2015-10-18 (×3): qty 1

## 2015-10-18 NOTE — ED Notes (Signed)
Patient transported to X-ray 

## 2015-10-18 NOTE — ED Notes (Signed)
MD at bedside. 

## 2015-10-18 NOTE — ED Provider Notes (Signed)
CSN: [id number]     Arrival date & time 10/18/15  1459 History   First MD Initiated Contact with Patient 10/18/15 1815     Chief Complaint  Patient presents with  . Neck Pain     (Consider location/radiation/quality/duration/timing/severity/associated sxs/prior Treatment) HPI   15 y M w PMH chf, afib on xarelto, dm, htn, asthma, ckd, anemia coming in with two complaints.  The first complaint is chronic neck pain that has worsened over the past few days.  No falls, no injuries.   No chest pain/sob.  While in the waiting room he was able to "stretch" his neck which resolved his neck pain.  He now has no neck pain.  Second complaint of mild fatigue over the past few days.  No blood in his stools, no black stools.  No fever/chills/chest pain/sob/or recent infectious sx.  Past Medical History  Diagnosis Date  . CHF (congestive heart failure) (HCC)   . Atrial fibrillation (HCC)   . Diabetes mellitus without complication (HCC)   . Hypertension   . Sleep apnea   . Asthma   . CKD (chronic kidney disease), stage III   . Anemia    Past Surgical History  Procedure Laterality Date  . 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 Wound debridement Right   . Multiple extractions with alveoloplasty N/A 01/17/2015    Procedure: Extraction of tooth #'s 14,15, 16 with alveolopalsty;  Surgeon: Charlynne Pander, DDS;  Location: [facility] OR;  Service: Oral Surgery;  Laterality: N/A;  . Cardioversion N/A 04/03/2015    Procedure: CARDIOVERSION;  Surgeon: Laurey Morale, MD;  Location: [facility] ENDOSCOPY;  Service: Cardiovascular;  Laterality: N/A;   Family History  Problem Relation Age of Onset  . COPD Mother   . Heart disease Father   . Cancer Sister     breast  . Heart disease Brother   . Diabetes Neg Hx    Social History  Substance Use Topics  . Smoking status: Never Smoker   . Smokeless tobacco: Never Used  . Alcohol Use: Yes     Comment: rare    Review of Systems  Constitutional: Negative for  fever and chills.  Eyes: Negative for redness.  Respiratory: Negative for cough and shortness of breath.   Cardiovascular: Negative for chest pain.  Gastrointestinal: Negative for nausea, vomiting, abdominal pain and diarrhea.  Genitourinary: Negative for dysuria.  Musculoskeletal: Positive for neck pain.  Skin: Negative for rash.  Neurological: Positive for weakness. Negative for headaches.  All other systems reviewed and are negative.     Allergies  Percocet and Penicillins  Home Medications   Prior to Admission medications   Medication Sig Start Date End Date Taking? Authorizing Provider  acetaminophen (TYLENOL) 500 MG tablet Take 500 mg by mouth every 4 (four) hours as needed (pain). Do not exceed 4 gms of tylenol in 24 hours   Yes Historical Provider, MD  albuterol (PROVENTIL) (2.5 MG/3ML) 0.083% nebulizer solution Take 3 mLs (2.5 mg total) by nebulization every 6 (six) hours as needed for wheezing or shortness of breath. 11/14/14  Yes [name], MD  atorvastatin (LIPITOR) 20 MG tablet Take 20 mg by mouth at bedtime.    Yes Historical Provider, MD  cholecalciferol (VITAMIN D) 1000 UNITS tablet Take 1,000 Units by mouth daily.   Yes Historical Provider, MD  gabapentin (NEURONTIN) 300 MG capsule Take 300 mg by mouth 2 (two) times daily.    Yes Historical Provider, MD  HUMALOG KWIKPEN 100 UNIT/ML KiwkPen Inject 3 time daily just before each meal 45-35-45 Patient taking differently: Inject 30-40 Units into the skin 3 (three) times daily. Inject 3 time daily just before each meal 40-30-40 08/15/15  Yes Romero Belling, MD  Insulin Glargine (LANTUS SOLOSTAR) 100 UNIT/ML Solostar Pen Inject 40 Units into the skin at bedtime.    Yes Historical Provider, MD  Magnesium 400 MG CAPS Take 400 mg by mouth daily.   Yes Historical Provider, MD  metolazone (ZAROXOLYN) 2.5 MG tablet Take 1 tablet (2.5 mg total) by mouth 2 (two) times a week. Every Mon and Fri Patient taking differently: Take  2.5 mg by mouth every Monday.  05/18/15  Yes Laurey Morale, MD  niacin 250 MG tablet Take 250 mg by mouth daily.   Yes Historical Provider, MD  Omega-3 Fatty Acids (FISH OIL) 1000 MG CAPS Take 2,000 mg by mouth daily.   Yes Historical Provider, MD  potassium chloride SA (K-DUR,KLOR-CON) 20 MEQ tablet Take 1 tablet (20 mEq total) by mouth daily. Patient taking differently: Take 20 mEq by mouth 2 (two) times a week. Mon / Caleen Essex 09/22/15  Yes Costin Otelia Sergeant, MD  PROAIR HFA 108 (90 BASE) MCG/ACT inhaler INHALE 1 TO 2 PUFFS BY MOUTH FOUR TIMES DAILY AS DIRECTED AS NEEDED FOR WHEEZING AND SHORTNESS OF BREATH 04/06/15  Yes [name], MD  Rivaroxaban (XARELTO) 15 MG TABS tablet Take 1 tablet (15 mg total) by mouth daily with breakfast. Patient taking differently: Take 15 mg by mouth daily with supper.  06/26/15  Yes Laurey Morale, MD  torsemide (DEMADEX) 20 MG tablet Take 3 tablets (60 mg total) by mouth 2 (two) times daily. 09/22/15  Yes Costin Otelia Sergeant, MD   BP 119/44 mmHg  Pulse 52  Temp(Src) 98 F (36.7 C) (Oral)  Resp 18  SpO2 92% Physical Exam  Constitutional: He is oriented to person, place, and time. No distress.  HENT:  Head: Normocephalic and atraumatic.  Eyes: EOM are normal. Pupils are equal, round, and reactive to light.  Neck: Normal range of motion. Neck supple.  Cardiovascular: Normal rate.   Pulmonary/Chest: Effort normal and breath sounds normal. No respiratory distress.  Abdominal: Soft. There is no tenderness.  Musculoskeletal: Normal range of motion.  Full strength in bilateral upper/lower extremities   Neurological: He is alert and oriented to person, place, and time.  Skin: No rash noted. He is not diaphoretic.  Psychiatric: He has a normal mood and affect.    ED Course  Procedures (including critical care time) Labs Review Labs Reviewed  CBC WITH DIFFERENTIAL/PLATELET - Abnormal; Notable for the following:    RBC 3.84 (*)    Hemoglobin 10.2 (*)     HCT 33.7 (*)    RDW 16.0 (*)    All other components within normal limits  BASIC METABOLIC PANEL - Abnormal; Notable for the following:    Chloride 91 (*)    Glucose, Bld 216 (*)    BUN 79 (*)    Creatinine, Ser 2.99 (*)    GFR calc non Af Amer 20 (*)    GFR calc Af Amer 23 (*)    All other components within normal limits    Imaging Review No results found. I have personally reviewed and evaluated these images and lab results as part of my medical decision-making.   EKG Interpretation   Date/Time:  Wednesday October 18 2015 19:11:21 EST Ventricular Rate:  50 PR Interval:  QRS Duration: 104 QT Interval:  493 QTC Calculation: 450 R Axis:   -9 Text Interpretation:  Atrial fibrillation Low voltage, extremity and  precordial leads No significant change since last tracing Confirmed by  KNOTT MD, Reuel Boom ([id number]) on 10/18/2015 7:38:06 PM      MDM   Final diagnoses:  None     53 y M w PMH chf, afib on xarelto, dm, htn, asthma, ckd, anemia coming in with two complaints of neck pain which has resolved and  Mild weakness.  Exam as above unremarkable.  Will obtain ekg though suspicion for ACS is very low given no chest pain or sob. He is afebrile with stable vitals, doubt infection.  Patient has had borderline hgb in the past.  Will obtain cbc to eval for anemia and bmp to eval for electrolyte abnormality.  No headache.  No need for neuro imaging.  Labs with aki, likely 2/2 overdiuresis.  Will give bolus and admit to medicine for aki.  No K elevation.    Silas Flood, MD 10/18/15 2012  Lyndal Pulley, MD 10/19/15 [phone]

## 2015-10-18 NOTE — ED Notes (Signed)
Pt reports he "cured" hisself while he waited in the waiting room. No complaints at this time.

## 2015-10-18 NOTE — ED Notes (Signed)
When this RN asked patient if an EKG could be completed, pt advised this RN to "let him make a phone call first."

## 2015-10-18 NOTE — ED Notes (Addendum)
Per EMS: pt from home for eval of chronic neck pain, pt states pain has been getting worse past few days. Pt normally uses walker. Pt also states that he has been getting more "fatigued" throughout the day. Denies any other complants at this time.

## 2015-10-18 NOTE — H&P (Signed)
[facility] History and Physical  Mark Roberson NWG:[id number] DOB: Jun 17, 1946 DOA: 10/18/2015  Referring physician: Dr.Prolux. PCP: [name], MD  Specialists: [facility].  Chief Complaint: Neck pain.  HPI: Mark Roberson is a 70 y.o. male with history of diastolic CHF, OSA, diabetes mellitus, chronic kidney disease, slow A. fib, CAD presented to the ER because of neck pain. Patient has been experiencing neck pain since yesterday evening. While waiting in the ER room patient's neck pain improved. Pain was mostly on moving his neck. Denies any chest pain or shortness of breath. Patient states over the last few days he has been feeling weak. Patient's blood work shows increased creatinine from baseline of 1.8-2.9. Patient was given 500 mL normal saline in the ER and admitted for further observation for acute on chronic renal failure. Patient at discharge last month on 09/22/2015 was weighing around 308 pounds and presently is running around 299 pounds. Patient has been taking torsemide 60 mg twice daily since last discharge along with Zaroxolyn. Patient is not on ACE inhibitor secondary to chronic kidney disease.  Review of Systems: As presented in the history of presenting illness, rest negative.  Past Medical History  Diagnosis Date  . CHF (congestive heart failure) (HCC)   . Atrial fibrillation (HCC)   . Diabetes mellitus without complication (HCC)   . Hypertension   . Sleep apnea   . Asthma   . CKD (chronic kidney disease), stage III   . Anemia    Past Surgical History  Procedure Laterality Date  . 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 Wound debridement Right   . Multiple extractions with alveoloplasty N/A 01/17/2015    Procedure: Extraction of tooth #'s 14,15, 16 with alveolopalsty;  Surgeon: Charlynne Pander, DDS;  Location: [facility] OR;  Service: Oral Surgery;  Laterality: N/A;  . Cardioversion N/A 04/03/2015    Procedure: CARDIOVERSION;  Surgeon: Laurey Morale, MD;  Location: [facility] ENDOSCOPY;  Service: Cardiovascular;  Laterality: N/A;   Social History:  reports that he has never smoked. He has never used smokeless tobacco. He reports that he drinks alcohol. He reports that he does not use illicit drugs. Where does patient live home. Can patient participate in ADLs? Yes.  Allergies  Allergen Reactions  . Percocet [Oxycodone-Acetaminophen] Other (See Comments)    hallucination  . Penicillins Hives    Has patient had a PCN reaction causing immediate rash, facial/tongue/throat swelling, SOB or lightheadedness with hypotension: No Has patient had a PCN reaction causing severe rash involving mucus membranes or skin necrosis: No Has patient had a PCN reaction that required hospitalization No Has patient had a PCN reaction occurring within the last 10 years: No If all of the above answers are "NO", then may proceed with Cephalosporin use.    Family History:  Family History  Problem Relation Age of Onset  . COPD Mother   . Heart disease Father   . Cancer Sister     breast  . Heart disease Brother   . Diabetes Neg Hx       Prior to Admission medications   Medication Sig Start Date End Date Taking? Authorizing Provider  acetaminophen (TYLENOL) 500 MG tablet Take 500 mg by mouth every 4 (four) hours as needed (pain). Do not exceed 4 gms of tylenol in 24 hours   Yes Historical Provider, MD  albuterol (PROVENTIL) (2.5 MG/3ML) 0.083% nebulizer solution Take 3 mLs (2.5 mg total) by nebulization every 6 (six) hours  as needed for wheezing or shortness of breath. 11/14/14  Yes [name], MD  atorvastatin (LIPITOR) 20 MG tablet Take 20 mg by mouth at bedtime.    Yes Historical Provider, MD  cholecalciferol (VITAMIN D) 1000 UNITS tablet Take 1,000 Units by mouth daily.   Yes Historical Provider, MD  gabapentin (NEURONTIN) 300 MG capsule Take 300 mg by mouth 2 (two) times daily.    Yes Historical Provider, MD  HUMALOG KWIKPEN 100 UNIT/ML  KiwkPen Inject 3 time daily just before each meal 45-35-45 Patient taking differently: Inject 30-40 Units into the skin 3 (three) times daily. Inject 3 time daily just before each meal 40-30-40 08/15/15  Yes Romero Belling, MD  Insulin Glargine (LANTUS SOLOSTAR) 100 UNIT/ML Solostar Pen Inject 40 Units into the skin at bedtime.    Yes Historical Provider, MD  Magnesium 400 MG CAPS Take 400 mg by mouth daily.   Yes Historical Provider, MD  metolazone (ZAROXOLYN) 2.5 MG tablet Take 1 tablet (2.5 mg total) by mouth 2 (two) times a week. Every Mon and Fri Patient taking differently: Take 2.5 mg by mouth every Monday.  05/18/15  Yes Laurey Morale, MD  niacin 250 MG tablet Take 250 mg by mouth daily.   Yes Historical Provider, MD  Omega-3 Fatty Acids (FISH OIL) 1000 MG CAPS Take 2,000 mg by mouth daily.   Yes Historical Provider, MD  potassium chloride SA (K-DUR,KLOR-CON) 20 MEQ tablet Take 1 tablet (20 mEq total) by mouth daily. Patient taking differently: Take 20 mEq by mouth 2 (two) times a week. Mon / Caleen Essex 09/22/15  Yes Costin Otelia Sergeant, MD  PROAIR HFA 108 (90 BASE) MCG/ACT inhaler INHALE 1 TO 2 PUFFS BY MOUTH FOUR TIMES DAILY AS DIRECTED AS NEEDED FOR WHEEZING AND SHORTNESS OF BREATH 04/06/15  Yes [name], MD  Rivaroxaban (XARELTO) 15 MG TABS tablet Take 1 tablet (15 mg total) by mouth daily with breakfast. Patient taking differently: Take 15 mg by mouth daily with supper.  06/26/15  Yes Laurey Morale, MD  torsemide (DEMADEX) 20 MG tablet Take 3 tablets (60 mg total) by mouth 2 (two) times daily. 09/22/15  Yes Costin Otelia Sergeant, MD    Physical Exam: Filed Vitals:   10/18/15 1900 10/18/15 2000 10/18/15 2045 10/18/15 2237  BP: 101/39 119/44 102/58 134/38  Pulse: 53 52 57 52  Temp:    97.8 F (36.6 C)  TempSrc:    Oral  Resp:  18  20  Height:    6' (1.829 m)  Weight:    135.988 kg (299 lb 12.8 oz)  SpO2: 100% 92% 75% 100%     General:  Moderately built and nourished.  Eyes:  Anicteric no pallor.  ENT: No discharge from the ears eyes nose or mouth.  Neck: No JVD appreciated. No mass felt.  Cardiovascular: S1 and S2 heard.  Respiratory: No rhonchi or crepitations.  Abdomen: Soft nontender bowel sounds present.  Skin: No rash.  Musculoskeletal: No edema.  Psychiatric: Appears normal.  Neurologic: Alert awake oriented to time place and person. Moves all extremities.  Labs on Admission:  Basic Metabolic Panel:  Recent Labs Lab 10/18/15 1841  NA 135  K 3.8  CL 91*  CO2 31  GLUCOSE 216*  BUN 79*  CREATININE 2.99*  CALCIUM 9.6   Liver Function Tests: No results for input(s): AST, ALT, ALKPHOS, BILITOT, PROT, ALBUMIN in the last 168 hours. No results for input(s): LIPASE, AMYLASE in the last 168 hours.  No results for input(s): AMMONIA in the last 168 hours. CBC:  Recent Labs Lab 10/18/15 1841  WBC 7.3  NEUTROABS 5.1  HGB 10.2*  HCT 33.7*  MCV 87.8  PLT 197   Cardiac Enzymes: No results for input(s): CKTOTAL, CKMB, CKMBINDEX, TROPONINI in the last 168 hours.  BNP (last 3 results)  Recent Labs  05/03/15 0516 05/25/15 1217 09/13/15 1815  BNP 284.3* 214.5* 280.4*    ProBNP (last 3 results) No results for input(s): PROBNP in the last 8760 hours.  CBG:  Recent Labs Lab 10/18/15 2132  GLUCAP 188*    Radiological Exams on Admission: Dg Chest 2 View  10/18/2015  CLINICAL DATA:  Fatigue for the past few days.  Initial encounter. EXAM: CHEST  2 VIEW COMPARISON:  PA and lateral chest 09/13/2015 and 05/02/2015. FINDINGS: The lungs are clear. Heart size is upper normal. No pneumothorax or pleural effusion. IMPRESSION: No acute disease. Electronically Signed   By: Drusilla Kanner M.D.   On: 10/18/2015 20:24    EKG: Independently reviewed. Slow A. fib.  Assessment/Plan Principal Problem:   Renal failure (ARF), acute on chronic (HCC) Active Problems:   HTN (hypertension)   DM (diabetes mellitus), type 2 with renal  complications (HCC)   Hypothyroidism   Chronic diastolic CHF (congestive heart failure) (HCC)   1. Acute on chronic renal failure stage III - patient has received 500 mL normal saline bolus in the ER. At this time I have decreased patient's torsemide dose from 60 mg twice daily to 40 mg twice a day which can be started from tomorrow morning. Skipping of the evening dose. We will hold off Zaroxolyn for now. Closely follow intake and output and metabolic panel. 2. Neck pain probably muscular skeletal personally pain resolved but given history of CAD we'll cycle cardiac markers. 3. Diabetes mellitus type 2 - continue home dose of Lantus with sliding scale coverage. 4. Chronic atrial fibrillation with slow response - continue xarelto patient's chads 2 vasc score is 6. 5. Chronic diastolic CHF last EF measured was 65-70%, measured last month - see #1 with regarding to diuretics. 6. Hypothyroidism on Synthroid. 7. Chronic anemia follow CBC. 8. OSA noncompliant with CPAP.   DVT Prophylaxis xarelto.  Code Status: DO NOT RESUSCITATE.  Family Communication: Discussed with patient.  Disposition Plan: Admit for observation.    Mark Roberson N. [facility] Pager [phone].  If 7PM-7AM, please contact night-coverage [email or web address] Password [facility] 10/18/2015, 11:55 PM

## 2015-10-18 NOTE — ED Notes (Signed)
RN attempted x 2 for IV start; 2nd RN to start IV  

## 2015-10-18 NOTE — ED Notes (Signed)
Pt is delaying care to make a phone call; RN advised pt that it is important that he goes to xray at this time; Pt continue to make phone call

## 2015-10-18 NOTE — Progress Notes (Signed)
ED RN called for report. Room ready for admit.

## 2015-10-19 DIAGNOSIS — I5032 Chronic diastolic (congestive) heart failure: Secondary | ICD-10-CM

## 2015-10-19 DIAGNOSIS — N179 Acute kidney failure, unspecified: Secondary | ICD-10-CM

## 2015-10-19 DIAGNOSIS — N189 Chronic kidney disease, unspecified: Secondary | ICD-10-CM

## 2015-10-19 DIAGNOSIS — I10 Essential (primary) hypertension: Secondary | ICD-10-CM

## 2015-10-19 DIAGNOSIS — E1121 Type 2 diabetes mellitus with diabetic nephropathy: Secondary | ICD-10-CM | POA: Diagnosis not present

## 2015-10-19 DIAGNOSIS — Z794 Long term (current) use of insulin: Secondary | ICD-10-CM

## 2015-10-19 LAB — TROPONIN I
TROPONIN I: 0.03 ng/mL (ref <0.031)
TROPONIN I: 0.03 ng/mL (ref <0.031)
TROPONIN I: 0.03 ng/mL (ref <0.031)

## 2015-10-19 LAB — CBC WITH DIFFERENTIAL/PLATELET
BASOS ABS: 0 10*3/uL (ref 0.0–0.1)
BASOS PCT: 0 %
EOS ABS: 0.5 10*3/uL (ref 0.0–0.7)
EOS PCT: 7 %
HEMATOCRIT: 31.9 % — AB (ref 39.0–52.0)
Hemoglobin: 9.8 g/dL — ABNORMAL LOW (ref 13.0–17.0)
Lymphocytes Relative: 18 %
Lymphs Abs: 1.3 10*3/uL (ref 0.7–4.0)
MCH: 27 pg (ref 26.0–34.0)
MCHC: 30.7 g/dL (ref 30.0–36.0)
MCV: 87.9 fL (ref 78.0–100.0)
MONO ABS: 0.8 10*3/uL (ref 0.1–1.0)
MONOS PCT: 10 %
NEUTROS ABS: 4.7 10*3/uL (ref 1.7–7.7)
Neutrophils Relative %: 65 %
PLATELETS: 185 10*3/uL (ref 150–400)
RBC: 3.63 MIL/uL — ABNORMAL LOW (ref 4.22–5.81)
RDW: 15.5 % (ref 11.5–15.5)
WBC: 7.2 10*3/uL (ref 4.0–10.5)

## 2015-10-19 LAB — COMPREHENSIVE METABOLIC PANEL
ALBUMIN: 3.3 g/dL — AB (ref 3.5–5.0)
ALK PHOS: 57 U/L (ref 38–126)
ALT: 11 U/L — AB (ref 17–63)
ANION GAP: 9 (ref 5–15)
AST: 15 U/L (ref 15–41)
BILIRUBIN TOTAL: 0.8 mg/dL (ref 0.3–1.2)
BUN: 76 mg/dL — ABNORMAL HIGH (ref 6–20)
CALCIUM: 9.3 mg/dL (ref 8.9–10.3)
CO2: 34 mmol/L — AB (ref 22–32)
CREATININE: 2.56 mg/dL — AB (ref 0.61–1.24)
Chloride: 92 mmol/L — ABNORMAL LOW (ref 101–111)
GFR calc non Af Amer: 24 mL/min — ABNORMAL LOW (ref >60)
GFR, EST AFRICAN AMERICAN: 28 mL/min — AB (ref >60)
GLUCOSE: 170 mg/dL — AB (ref 65–99)
Potassium: 3.6 mmol/L (ref 3.5–5.1)
Sodium: 135 mmol/L (ref 135–145)
TOTAL PROTEIN: 7.1 g/dL (ref 6.5–8.1)

## 2015-10-19 LAB — GLUCOSE, CAPILLARY
Glucose-Capillary: 153 mg/dL — ABNORMAL HIGH (ref 65–99)
Glucose-Capillary: 170 mg/dL — ABNORMAL HIGH (ref 65–99)
Glucose-Capillary: 252 mg/dL — ABNORMAL HIGH (ref 65–99)
Glucose-Capillary: 300 mg/dL — ABNORMAL HIGH (ref 65–99)
Glucose-Capillary: 161 mg/dL — ABNORMAL HIGH (ref 65–99)

## 2015-10-19 LAB — MRSA PCR SCREENING: MRSA BY PCR: NEGATIVE

## 2015-10-19 MED ORDER — INSULIN GLARGINE 100 UNIT/ML ~~LOC~~ SOLN
40.0000 [IU] | Freq: Every day | SUBCUTANEOUS | Status: DC
  Administered 2015-10-19 – 2015-10-20 (×3): 40 [IU] via SUBCUTANEOUS
  Filled 2015-10-19 (×4): qty 0.4

## 2015-10-19 NOTE — Progress Notes (Signed)
PROGRESS NOTE    Mark Roberson ZOX:[id number] DOB: Apr 24, 1946 DOA: 10/18/2015 PCP: Mark Broker, MD   HPI/Brief narrative 70 year old male with history of chronic diastolic CHF, OSA, DM, stage III chronic kidney disease, PAF, HTN, CAD and anemia presented to the [facility] ED on 10/18/15 with complaints of neck pain which resolved in the ED. He also complained of feeling generally weak. Workup revealed worsening creatinine from baseline of 1.8 > 2.9. He was admitted for evaluation and management of acute on chronic kidney disease. Patient was discharged on 09/22/15 with weight around 308 pounds and now admitted with weight 299 pounds. He has been on high-dose diuretics (torsemide and Zaroxolyn).   Assessment/Plan:   1. Acute on stage III chronic kidney disease: Likely precipitated by over diuresis. Clinically appears euvolemic. Received 500 mL IV normal saline bolus in ED. Torsemide has been decreased from 60 MG to 40 MG twice a day. Zaroxolyn on hold for now. Creatinine has improved from 2.9 > 2.56. Follow BMP in a.m.  2. Neck pain: Probably musculoskeletal and has resolved. 3. Chronic diastolic CHF: LVEF 65-70 percent recently. Diuretic management as per problem #1. Patient follows with Dr. Loleta Roberson consider discussing in a.m. 4. Paroxysmal atrial fibrillation/slow ventricular response: chads 2 vasc score is 6. Continue Xarelto. 5. Type II DM with renal complications: Mildly uncontrolled CBGs. Continue Lantus and SSI. 6. Chronic anemia: Stable 7. OSA: Non-compliant with CPAP 8. Hypothyroid: Continue Synthroid  DVT prophylaxis: Xarelto AC Code Status: DO NOT RESUSCITATE Family Communication: None at bedside Disposition Plan: DC home possibly in the next 24-48 hours.   Consultants:  None  Procedures:  None  Antimicrobials:  None   Subjective: Denies complaints. No further neck pain. No chest pain or dyspnea. Agrees that he might have been dehydrated from over  diuresis.  Objective: Filed Vitals:   10/18/15 2045 10/18/15 2237 10/19/15 0500 10/19/15 0930  BP: 102/58 134/38 102/42 126/64  Pulse: 57 52 57 64  Temp:  97.8 F (36.6 C) 97.9 F (36.6 C) 98.5 F (36.9 C)  TempSrc:  Oral Oral Oral  Resp:  Height:  6' (1.829 m)    Weight:  135.988 kg (299 lb 12.8 oz)    SpO2: 75% 100% 100% 100%    Intake/Output Summary (Last 24 hours) at 10/19/15 1643 Last data filed at 10/19/15 1259  Gross per 24 hour  Intake   1340 ml  Output   2225 ml  Net   -885 ml   Filed Weights   10/18/15 2237  Weight: 135.988 kg (299 lb 12.8 oz)    Exam:  General exam: Moderately built and morbidly obese pleasant middle-aged male sitting up comfortably in chair this morning. Respiratory system: Clear. No increased work of breathing. Cardiovascular system: S1 & S2 heard, RRR. No JVD, murmurs, gallops, clicks or pedal edema. Telemetry: PAF/ventricular rate in the 50s Gastrointestinal system: Abdomen is nondistended, soft and nontender. Normal bowel sounds heard. Central nervous system: Alert and oriented. No focal neurological deficits. Extremities: Symmetric 5 x 5 power.   Data Reviewed: Basic Metabolic Panel:  Recent Labs Lab 10/18/15 1841 10/19/15 0539  NA 135 135  K 3.8 3.6  CL 91* 92*  CO2 31 34*  GLUCOSE 216* 170*  BUN 79* 76*  CREATININE 2.99* 2.56*  CALCIUM 9.6 9.3   Liver Function Tests:  Recent Labs Lab 10/19/15 0539  AST 15  ALT 11*  ALKPHOS 57  BILITOT 0.8  PROT 7.1  ALBUMIN 3.3*  No results for input(s): LIPASE, AMYLASE in the last 168 hours. No results for input(s): AMMONIA in the last 168 hours. CBC:  Recent Labs Lab 10/18/15 1841 10/19/15 0539  WBC 7.3 7.2  NEUTROABS 5.1 4.7  HGB 10.2* 9.8*  HCT 33.7* 31.9*  MCV 87.8 87.9  PLT 197 185   Cardiac Enzymes:  Recent Labs Lab 10/19/15 0223 10/19/15 0539 10/19/15 1130  TROPONINI 0.03 0.03 0.03   BNP (last 3 results) No results for input(s): PROBNP  in the last 8760 hours. CBG:  Recent Labs Lab 10/18/15 2132 10/19/15 0805 10/19/15 1156  GLUCAP 188* 153* 170*    Recent Results (from the past 240 hour(s))  MRSA PCR Screening     Status: None   Collection Time: 10/18/15 10:14 PM  Result Value Ref Range Status   MRSA by PCR NEGATIVE NEGATIVE Final    Comment:        The GeneXpert MRSA Assay (FDA approved for NASAL specimens only), is one component of a comprehensive MRSA colonization surveillance program. It is not intended to diagnose MRSA infection nor to guide or monitor treatment for MRSA infections.          Studies: Dg Chest 2 View  10/18/2015  CLINICAL DATA:  Fatigue for the past few days.  Initial encounter. EXAM: CHEST  2 VIEW COMPARISON:  PA and lateral chest 09/13/2015 and 05/02/2015. FINDINGS: The lungs are clear. Heart size is upper normal. No pneumothorax or pleural effusion. IMPRESSION: No acute disease. Electronically Signed   By: Drusilla Kanner M.D.   On: 10/18/2015 20:24        Scheduled Meds: . antiseptic oral rinse  7 mL Mouth Rinse BID  . atorvastatin  20 mg Oral QHS  . cholecalciferol  1,000 Units Oral Daily  . gabapentin  300 mg Oral BID  . insulin aspart  0-20 Units Subcutaneous TID WC  . insulin glargine  40 Units Subcutaneous QHS  . magnesium oxide  400 mg Oral Daily  . niacin  250 mg Oral Daily  . omega-3 acid ethyl esters  2,000 mg Oral Daily  . [START ON 10/20/2015] potassium chloride SA  20 mEq Oral Once per day on Mon Fri  . Rivaroxaban  15 mg Oral Q supper  . sodium chloride flush  3 mL Intravenous Q12H  . torsemide  40 mg Oral BID   Continuous Infusions:   Principal Problem:   Renal failure (ARF), acute on chronic (HCC) Active Problems:   HTN (hypertension)   DM (diabetes mellitus), type 2 with renal complications (HCC)   Hypothyroidism   Chronic diastolic CHF (congestive heart failure) (HCC)    Time spent: 30 minutes.    Mark Scott, MD, FACP, FHM. [facility] Pager [phone]  If 7PM-7AM, please contact night-coverage [email or web address] Password [facility] 10/19/2015, 4:43 PM

## 2015-10-20 ENCOUNTER — Telehealth: Payer: Self-pay | Admitting: Internal Medicine

## 2015-10-20 DIAGNOSIS — I10 Essential (primary) hypertension: Secondary | ICD-10-CM | POA: Diagnosis not present

## 2015-10-20 DIAGNOSIS — I5032 Chronic diastolic (congestive) heart failure: Secondary | ICD-10-CM | POA: Diagnosis not present

## 2015-10-20 DIAGNOSIS — E1121 Type 2 diabetes mellitus with diabetic nephropathy: Secondary | ICD-10-CM | POA: Diagnosis not present

## 2015-10-20 DIAGNOSIS — N179 Acute kidney failure, unspecified: Secondary | ICD-10-CM | POA: Diagnosis not present

## 2015-10-20 LAB — GLUCOSE, CAPILLARY
GLUCOSE-CAPILLARY: 163 mg/dL — AB (ref 65–99)
GLUCOSE-CAPILLARY: 370 mg/dL — AB (ref 65–99)
Glucose-Capillary: 195 mg/dL — ABNORMAL HIGH (ref 65–99)
Glucose-Capillary: 209 mg/dL — ABNORMAL HIGH (ref 65–99)

## 2015-10-20 LAB — BASIC METABOLIC PANEL
Anion gap: 12 (ref 5–15)
BUN: 85 mg/dL — AB (ref 6–20)
CHLORIDE: 92 mmol/L — AB (ref 101–111)
CO2: 30 mmol/L (ref 22–32)
CREATININE: 2.29 mg/dL — AB (ref 0.61–1.24)
Calcium: 9 mg/dL (ref 8.9–10.3)
GFR calc Af Amer: 32 mL/min — ABNORMAL LOW (ref >60)
GFR calc non Af Amer: 27 mL/min — ABNORMAL LOW (ref >60)
Glucose, Bld: 170 mg/dL — ABNORMAL HIGH (ref 65–99)
Potassium: 3.8 mmol/L (ref 3.5–5.1)
SODIUM: 134 mmol/L — AB (ref 135–145)

## 2015-10-20 MED ORDER — POTASSIUM CHLORIDE CRYS ER 20 MEQ PO TBCR: 20.0000 meq | EXTENDED_RELEASE_TABLET | ORAL | Status: AC

## 2015-10-20 MED ORDER — RIVAROXABAN 15 MG PO TABS
15.0000 mg | ORAL_TABLET | Freq: Every day | ORAL | Status: DC
Start: 2015-10-20 — End: 2015-10-24

## 2015-10-20 MED ORDER — TORSEMIDE 20 MG PO TABS
40.0000 mg | ORAL_TABLET | Freq: Two times a day (BID) | ORAL | Status: DC
Start: 2015-10-20 — End: 2015-10-21

## 2015-10-20 MED ORDER — HUMALOG KWIKPEN 100 UNIT/ML ~~LOC~~ SOPN: 30.0000 [IU] | PEN_INJECTOR | Freq: Three times a day (TID) | SUBCUTANEOUS | Status: DC

## 2015-10-20 MED ORDER — TORSEMIDE 20 MG PO TABS: 20.0000 mg | ORAL_TABLET | Freq: Two times a day (BID) | ORAL | Status: DC

## 2015-10-20 NOTE — Progress Notes (Signed)
Pt w/ c/o dizziness while sitting and esp with standing. BP during the last hour has been 85/45-84/44. Hr 50-s-60's. Currently BP is 93/46 hr 60 and pt states he doesn't feel quite right but feels better than an hour ago. Dr Waymon Amato notified and orders received.

## 2015-10-20 NOTE — Progress Notes (Signed)
Addendum  Patient's recent blood pressures in the 90s/40 and patient complaining of intermittent mild dizziness. Cancel discharge plans for today. Hold tonight's dose of torsemide. Patient encouraged to increase oral fluid intake. Monitor closely. Check BMP in a.m. Hopefully if blood pressure stabilizes, possible discharge home in a.m. on even more reduced dose of torsemide 20 mg twice a day.  [name], MD, FACP, FHM. [facility] Pager [phone]  If 7PM-7AM, please contact night-coverage [email or web address] Password [email or web address] 10/20/2015, 2:06 PM

## 2015-10-20 NOTE — Discharge Summary (Addendum)
Physician Discharge Summary  Calahan Pak FAO:[id number] DOB: 1945-12-11 DOA: 10/18/2015  PCP: [name], MD  Primary Cardiologist: Dr. Dr. Shirlee Latch  Admit date: 10/18/2015 Discharge date: 10/21/2015  Time spent: Greater than 30 minutes  Recommendations for Outpatient Follow-up:  1. Advanced heart failure clinic: Patient has appointment on 10/24/15. To be seen with repeat labs (CBC & BMP). 2. Dr. Hillard Danker, PCP in 1 week.  Discharge Diagnoses:  Principal Problem:   Renal failure (ARF), acute on chronic (HCC) Active Problems:   HTN (hypertension)   DM (diabetes mellitus), type 2 with renal complications (HCC)   Hypothyroidism   Chronic diastolic CHF (congestive heart failure) (HCC)   Discharge Condition: Improved & Stable  Diet recommendation: Heart healthy and diabetic diet.  Filed Weights   10/19/15 1929 10/20/15 1200 10/21/15 0150  Weight: 139.2 kg (306 lb 14.1 oz) 135.807 kg (299 lb 6.4 oz) 137.8 kg (303 lb 12.7 oz)    History of present illness:  70 year old male with history of chronic diastolic CHF, OSA, DM, stage III chronic kidney disease, PAF, HTN, CAD and anemia presented to the [facility] ED on 10/18/15 with complaints of neck pain which resolved in the ED. He also complained of feeling generally weak. Workup revealed worsening creatinine from baseline of 1.8 > 2.9. He was admitted for evaluation and management of acute on chronic kidney disease. Patient was discharged on 09/22/15 with weight around 308 pounds and now admitted with weight 299 pounds. He has been on high-dose diuretics (torsemide and Zaroxolyn).  Hospital Course:    1. Acute on stage III chronic kidney disease: Likely precipitated by over diuresis. Clinically appears euvolemic. Received 500 mL IV normal saline bolus in ED. Torsemide has been decreased from 60 MG to 40 MG twice a day. Zaroxolyn discontinued for now. Creatinine has improved from 2.9 > 2.56 >2.29 >1.91. Close outpatient follow-up  with the advanced heart failure clinic on 1/31 with repeat BMP. 2. Hypotension: Patient was about to be discharged home on 1/27 when he developed mildly symptomatic hypotension with systolic blood pressures in the 80s-90s. Held torsemide dose since 1/27 PM and encouraged increased oral fluid intake. Blood pressures have since normalized. Discussed with cardiologist with the advanced heart failure team who advised holding off torsemide for today and tomorrow and resume at reduced dose of 40 mg twice a day from 10/23/15. This was likely secondary to intravascular volume depletion from ongoing diuresis. No further dizziness reported. 3. Neck pain: Probably musculoskeletal and has resolved. 4. Chronic diastolic CHF: LVEF 65-70 percent recently. Diuretic management as per problem #1. Patient follows with Dr. Marca Ancona. Diuretic management as indicated in problem #2 . Outpatient follow-up on 1/31. Diuretic regimen can further be adjusted during that follow-up. Office weight: 308 pounds. Admitted weight: 299 pounds. -2.7 L since admission. 5. Paroxysmal atrial fibrillation/slow ventricular response: chads 2 vasc score is 6. Continue Xarelto. 6. Type II DM with renal complications: Uncontrolled and fluctuating CBGs. Continue home dose of insulin's. Outpatient follow-up with PCP. 7. Chronic anemia: Stable 8. OSA: Non-compliant with CPAP 9. Hypothyroid: Continue Synthroid 10. Chronic respiratory failure with hypoxia: Patient on 2 L/m oxygen via nasal cannula continuously. Probably due to OSA and chronic CHF. 11. Hyponatremia: Likely related to CHF and diuretic use. Follow-up BMP on 1/31 as outpatient. Holding diuretics for a couple days.    Consultants:  None  Procedures:  None  Antimicrobials:  None   Discharge Exam:  Complaints: No dizziness, lightheadedness or dyspnea.  Filed Vitals:  10/20/15 2145 10/21/15 0150 10/21/15 0541 10/21/15 0837  BP: 112/50  123/49 120/49  Pulse: 62  62 60   Temp: 98 F (36.7 C)  97.5 F (36.4 C) 97.7 F (36.5 C)  TempSrc: Oral  Oral Oral  Resp: Height:      Weight:  137.8 kg (303 lb 12.7 oz)    SpO2: 100%  100% 100%    General exam: Moderately built and morbidly obese pleasant middle-aged male sitting up comfortably in chair this morning. Respiratory system: Clear. No increased work of breathing. Cardiovascular system: S1 & S2 heard, RRR. No JVD, murmurs, gallops, clicks or pedal edema. Telemetry: PAF with ventricular rate in the 50s. Gastrointestinal system: Abdomen is nondistended, soft and nontender. Normal bowel sounds heard. Central nervous system: Alert and oriented. No focal neurological deficits. Extremities: Symmetric 5 x 5 power.  Discharge Instructions      Discharge Instructions    (HEART FAILURE PATIENTS) Call MD:  Anytime you have any of the following symptoms: 1) 3 pound weight gain in 24 hours or 5 pounds in 1 week 2) shortness of breath, with or without a dry hacking cough 3) swelling in the hands, feet or stomach 4) if you have to sleep on extra pillows at night in order to breathe.    Complete by:  As directed      Call MD for:  difficulty breathing, headache or visual disturbances    Complete by:  As directed      Call MD for:  extreme fatigue    Complete by:  As directed      Call MD for:  persistant dizziness or light-headedness    Complete by:  As directed      Call MD for:  persistant nausea and vomiting    Complete by:  As directed      Call MD for:  severe uncontrolled pain    Complete by:  As directed      Call MD for:  temperature >100.4    Complete by:  As directed      Diet - low sodium heart healthy    Complete by:  As directed      Diet Carb Modified    Complete by:  As directed      Increase activity slowly    Complete by:  As directed             Medication List    STOP taking these medications        metolazone 2.5 MG tablet  Commonly known as:  ZAROXOLYN      TAKE  these medications        acetaminophen 500 MG tablet  Commonly known as:  TYLENOL  Take 500 mg by mouth every 4 (four) hours as needed (pain). Do not exceed 4 gms of tylenol in 24 hours     albuterol (2.5 MG/3ML) 0.083% nebulizer solution  Commonly known as:  PROVENTIL  Take 3 mLs (2.5 mg total) by nebulization every 6 (six) hours as needed for wheezing or shortness of breath.     PROAIR HFA 108 (90 Base) MCG/ACT inhaler  Generic drug:  albuterol  INHALE 1 TO 2 PUFFS BY MOUTH FOUR TIMES DAILY AS DIRECTED AS NEEDED FOR WHEEZING AND SHORTNESS OF BREATH     atorvastatin 20 MG tablet  Commonly known as:  LIPITOR  Take 20 mg by mouth at bedtime.     cholecalciferol 1000 units tablet  Commonly  known as:  VITAMIN D  Take 1,000 Units by mouth daily.     Fish Oil 1000 MG Caps  Take 2,000 mg by mouth daily.     gabapentin 300 MG capsule  Commonly known as:  NEURONTIN  Take 300 mg by mouth 2 (two) times daily.     HUMALOG KWIKPEN 100 UNIT/ML KiwkPen  Generic drug:  insulin lispro  Inject 0.3-0.4 mLs (30-40 Units total) into the skin 3 (three) times daily. Inject 3 time daily just before each meal 40-30-40     LANTUS SOLOSTAR 100 UNIT/ML Solostar Pen  Generic drug:  Insulin Glargine  Inject 40 Units into the skin at bedtime.     Magnesium 400 MG Caps  Take 400 mg by mouth daily.     niacin 250 MG tablet  Take 250 mg by mouth daily.     potassium chloride SA 20 MEQ tablet  Commonly known as:  K-DUR,KLOR-CON  Take 1 tablet (20 mEq total) by mouth 2 (two) times a week. Mon / Fri     Rivaroxaban 15 MG Tabs tablet  Commonly known as:  XARELTO  Take 1 tablet (15 mg total) by mouth daily with supper.     torsemide 20 MG tablet  Commonly known as:  DEMADEX  Take 2 tablets (40 mg total) by mouth 2 (two) times daily. Do not take on 10/21/15 & 10/22/15. Start taking on 10/23/15.  Start taking on:  10/23/2015       Follow-up Information    Follow up with [facility] On 10/24/2015.   Specialty:  Cardiology   Why:  At 2:40 p.m. To be seen with repeat labs (CBC & BMP).   Contact information:   [address] [id number] mc [place] [address] [phone]      Follow up with [name], MD. Schedule an appointment as soon as possible for a visit in 1 week.   Specialty:  Internal Medicine   Contact information:   [address] [place] [address] [phone]        The results of significant diagnostics from this hospitalization (including imaging, microbiology, ancillary and laboratory) are listed below for reference.    Significant Diagnostic Studies: Dg Chest 2 View  10/18/2015  CLINICAL DATA:  Fatigue for the past few days.  Initial encounter. EXAM: CHEST  2 VIEW COMPARISON:  PA and lateral chest 09/13/2015 and 05/02/2015. FINDINGS: The lungs are clear. Heart size is upper normal. No pneumothorax or pleural effusion. IMPRESSION: No acute disease. Electronically Signed   By: Drusilla Kanner M.D.   On: 10/18/2015 20:24    Microbiology: Recent Results (from the past 240 hour(s))  MRSA PCR Screening     Status: None   Collection Time: 10/18/15 10:14 PM  Result Value Ref Range Status   MRSA by PCR NEGATIVE NEGATIVE Final    Comment:        The GeneXpert MRSA Assay (FDA approved for NASAL specimens only), is one component of a comprehensive MRSA colonization surveillance program. It is not intended to diagnose MRSA infection nor to guide or monitor treatment for MRSA infections.      Labs: Basic Metabolic Panel:  Recent Labs Lab 10/18/15 1841 10/19/15 0539 10/20/15 0619 10/21/15 0519  NA 135 135 134* 128*  K 3.8 3.6 3.8 3.9  CL 91* 92* 92* 89*  CO2 31 34* 30 30  GLUCOSE 216* 170* 170* 183*  BUN 79* 76* 85*  87*  CREATININE 2.99* 2.56* 2.29* 1.91*  CALCIUM 9.6 9.3 9.0 8.9   Liver Function Tests:  Recent Labs Lab 10/19/15 0539  AST 15  ALT 11*  ALKPHOS 57   BILITOT 0.8  PROT 7.1  ALBUMIN 3.3*   No results for input(s): LIPASE, AMYLASE in the last 168 hours. No results for input(s): AMMONIA in the last 168 hours. CBC:  Recent Labs Lab 10/18/15 1841 10/19/15 0539  WBC 7.3 7.2  NEUTROABS 5.1 4.7  HGB 10.2* 9.8*  HCT 33.7* 31.9*  MCV 87.8 87.9  PLT 197 185   Cardiac Enzymes:  Recent Labs Lab 10/19/15 0223 10/19/15 0539 10/19/15 1130  TROPONINI 0.03 0.03 0.03   BNP: BNP (last 3 results)  Recent Labs  05/03/15 0516 05/25/15 1217 09/13/15 1815  BNP 284.3* 214.5* 280.4*    ProBNP (last 3 results) No results for input(s): PROBNP in the last 8760 hours.  CBG:  Recent Labs Lab 10/20/15 1119 10/20/15 1622 10/20/15 2122 10/21/15 0804 10/21/15 1151  GLUCAP 195* 370* 209* 153* 276*       Signed:  [name], MD, FACP, FHM. [facility] Pager [phone]  If 7PM-7AM, please contact night-coverage [email or web address] Password [email or web address] 10/21/2015, 12:30 PM

## 2015-10-20 NOTE — Discharge Instructions (Signed)
Acute Kidney Injury °Acute kidney injury is any condition in which there is sudden (acute) damage to the kidneys. Acute kidney injury was previously known as acute kidney failure or acute renal failure. The kidneys are two organs that lie on either side of the spine between the middle of the back and the front of the abdomen. The kidneys: °· Remove wastes and extra water from the blood.   °· Produce important hormones. These help keep bones strong, regulate blood pressure, and help create red blood cells.   °· Balance the fluids and chemicals in the blood and tissues. °A small amount of kidney damage may not cause problems, but a large amount of damage may make it difficult or impossible for the kidneys to work the way they should. Acute kidney injury may develop into long-lasting (chronic) kidney disease. It may also develop into a life-threatening disease called end-stage kidney disease. Acute kidney injury can get worse very quickly, so it should be treated right away. Early treatment may prevent other kidney diseases from developing. °CAUSES  °· A problem with blood flow to the kidneys. This may be caused by:   °¨ Blood loss.   °¨ Heart disease.   °¨ Severe burns.   °¨ Liver disease. °· Direct damage to the kidneys. This may be caused by: °¨ Some medicines.   °¨ A kidney infection.   °¨ Poisoning or consuming toxic substances.   °¨ A surgical wound.   °¨ A blow to the kidney area.   °· A problem with urine flow. This may be caused by:   °¨ Cancer.   °¨ Kidney stones.   °¨ An enlarged prostate. °SIGNS AND SYMPTOMS  °· Swelling (edema) of the legs, ankles, or feet.   °· Tiredness (lethargy).   °· Nausea or vomiting.   °· Confusion.   °· Problems with urination, such as:   °¨ Painful or burning feeling during urination.   °¨ Decreased urine production.   °¨ Frequent accidents in children who are potty trained.   °¨ Bloody urine.   °· Muscle twitches and cramps.   °· Shortness of breath.   °· Seizures.   °· Chest  pain or pressure. °Sometimes, no symptoms are present.  °DIAGNOSIS °Acute kidney injury may be detected and diagnosed by tests, including blood, urine, imaging, or kidney biopsy tests.  °TREATMENT °Treatment of acute kidney injury varies depending on the cause and severity of the kidney damage. In mild cases, no treatment may be needed. The kidneys may heal on their own. If acute kidney injury is more severe, your health care provider will treat the cause of the kidney damage, help the kidneys heal, and prevent complications from occurring. Severe cases may require a procedure to remove toxic wastes from the body (dialysis) or surgery to repair kidney damage. Surgery may involve:  °· Repair of a torn kidney.   °· Removal of an obstruction. °HOME CARE INSTRUCTIONS °· Follow your prescribed diet. °· Take medicines only as directed by your health care provider.  °· Do not take any new medicines (prescription, over-the-counter, or nutritional supplements) unless approved by your health care provider. Many medicines can worsen your kidney damage or may need to have the dose adjusted.   °· Keep all follow-up visits as directed by your health care provider. This is important. °· Observe your condition to make sure you are healing as expected. °SEEK IMMEDIATE MEDICAL CARE IF: °· You are feeling ill or have severe pain in the back or side.   °· Your symptoms return or you have new symptoms. °· You have any symptoms of end-stage kidney disease. These include:   °¨ Persistent itchiness.   °¨   Loss of appetite.   °¨ Headaches.   °¨ Abnormally dark or light skin. °¨ Numbness in the hands or feet.   °¨ Easy bruising.   °¨ Frequent hiccups.   °¨ Menstruation stops.   °· You have a fever. °· You have increased urine production. °· You have pain or bleeding when urinating. °MAKE SURE YOU:  °· Understand these instructions. °· Will watch your condition. °· Will get help right away if you are not doing well or get worse. °  °This  information is not intended to replace advice given to you by your health care provider. Make sure you discuss any questions you have with your health care provider. °  °Document Released: 03/25/2011 Document Revised: 09/30/2014 Document Reviewed: 05/08/2012 °Elsevier Interactive Patient Education ©2016 [employer] ° °

## 2015-10-20 NOTE — Telephone Encounter (Signed)
Left patient voice mail to call back to schedule 30 minute OV with Dr. Okey Dupre so she can send orders into [facility] for new oxygen concentrator.  Amy has orders.

## 2015-10-20 NOTE — Care Management Note (Addendum)
Case Management Note  Patient Details  Name: Logen Heintzelman MRN: [id number] Date of Birth: Mar 26, 1946  Subjective/Objective:            CM following for progression and d/c planning.        Action/Plan: 10/20/2015 No d/c needs identified. Noted that pt has PCP, Dr Nanda Quinton and is insured with medication coverage.   Expected Discharge Date:  10/20/15               Expected Discharge Plan:  Home/Self Care  In-House Referral:  NA  Discharge planning Services  NA  Post Acute Care Choice:  NA Choice offered to:  NA  DME Arranged:  N/A DME Agency:  NA  HH Arranged:  NA HH Agency:  NA  Status of Service:  Completed, signed off  Medicare Important Message Given:    Date Medicare IM Given:    Medicare IM give by:    Date Additional Medicare IM Given:    Additional Medicare Important Message give by:     If discussed at Long Length of Stay Meetings, dates discussed:    Additional Comments:  Starlyn Skeans, RN 10/20/2015, 12:17 PM

## 2015-10-21 DIAGNOSIS — N179 Acute kidney failure, unspecified: Secondary | ICD-10-CM | POA: Diagnosis not present

## 2015-10-21 LAB — BASIC METABOLIC PANEL
ANION GAP: 9 (ref 5–15)
BUN: 87 mg/dL — ABNORMAL HIGH (ref 6–20)
CALCIUM: 8.9 mg/dL (ref 8.9–10.3)
CO2: 30 mmol/L (ref 22–32)
Chloride: 89 mmol/L — ABNORMAL LOW (ref 101–111)
Creatinine, Ser: 1.91 mg/dL — ABNORMAL HIGH (ref 0.61–1.24)
GFR, EST AFRICAN AMERICAN: 40 mL/min — AB (ref >60)
GFR, EST NON AFRICAN AMERICAN: 34 mL/min — AB (ref >60)
Glucose, Bld: 183 mg/dL — ABNORMAL HIGH (ref 65–99)
Potassium: 3.9 mmol/L (ref 3.5–5.1)
SODIUM: 128 mmol/L — AB (ref 135–145)

## 2015-10-21 LAB — GLUCOSE, CAPILLARY
GLUCOSE-CAPILLARY: 153 mg/dL — AB (ref 65–99)
GLUCOSE-CAPILLARY: 276 mg/dL — AB (ref 65–99)
GLUCOSE-CAPILLARY: 161 mg/dL — AB (ref 65–99)

## 2015-10-21 MED ORDER — TORSEMIDE 20 MG PO TABS: 40.0000 mg | ORAL_TABLET | Freq: Two times a day (BID) | ORAL | Status: AC

## 2015-10-21 NOTE — Progress Notes (Signed)
Pt escorted out via wheelchair by NT

## 2015-10-21 NOTE — Progress Notes (Signed)
Progress note  Patient's discharge paperwork was completed on 10/20/15. Subsequently patient had mildly symptomatic hypotension with SBP in the 80s-90s. Discharge was canceled. Diuretics were held. Patient was encouraged to increase oral fluid intake. Interviewed and examined this morning. Patient feels better without dizziness, lightheadedness or dyspnea. Blood pressures have normalized. Discussed with cardiologist. Changes made to medications/discharge summary. Stable for DC home today.  [name], MD, FACP, FHM. [facility] Pager [phone]  If 7PM-7AM, please contact night-coverage [email or web address] Password [facility] 10/21/2015, 12:37 PM

## 2015-10-21 NOTE — Progress Notes (Signed)
Pt provided with discharge instruction including information on follow up appointments as well as new medications. Pt verbalized understanding of all information. IV site was discontinued without complication. VSS.

## 2015-10-21 NOTE — Progress Notes (Signed)
Patient's pressures have been running low. He states that it is normal for him. NP on call has been notified.

## 2015-10-23 ENCOUNTER — Telehealth: Payer: Self-pay | Admitting: *Deleted

## 2015-10-23 NOTE — Progress Notes (Signed)
PCP: Dr. Genella Mech HF Cardilogist: Dr Shirlee Latch.   Mr Mackowski. 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  He was seen by a cardiologist up in [place], but moved earlier this year to [facility].  He had PCI back in the 1990s, no cath since the '90s.  He had a successful TEE-guided DCCV for atrial fibrillation in [place] in 2/16.  Later in 2/16, he moved to [facility].  He was admitted to [facility] with acute on chronic diastolic CHF.  He was back in atrial fibrillation.  He was admitted again in 5/16 with dyspnea, weakness, and edema.  He was diuresed. He had epistaxis and Xarelto was briefly held.  He had tooth abscesses treated.  DCCV in 7/16 led to brief NSR but he reverted to atrial fibrillation.   He was admitted in 8/16 with acute on chronic diastolic CHF and diuresed well with IV Lasix.  HR was low, Coreg was stopped. After this, he ended up being over-diuresed and diuretics were cut back.   Admitted 10/18/15 with A/C CKD. Diuretics adjusted and he was discharged off torsemide for 2 days then restart on 1/30---> torsemide 60 mg in am and 40 mg in pm. Discharge weight was 303 pounds.   Today he returns for post hospital follow up.   Overall feeling ok. Weight at home 303 to 309 pounds.Restarted diuretics 1/30.  Eating high salt diet.  Able to walk 75 feet. SOB with activity. No BRBPR. Not moving much at home. Followed by [facility].  Ambulates with a walker. Taking all medications.    Labs (2/16): LDL 64 Labs (5/16): K 4.5, creatinine 1.81, Hgb 8.8 Labs (6/16): K 4.1 Creatinine 1.32 , HCT 28.6, BNP 223 Labs (7/16): K 3.3, Creatinine 1.84, HCT 26 Labs (8/16): K 3.8, creatinine 1.64 => 2.18, HCT 28.9, TSH 12 Labs (9/16): K 3.5, creatinine 1.85, BNP 214 Labs (10/21/2015): K 3.9 Creatinine 1.91   PMH: 1. CKD: Suspect diabetic nephropathy plays a role.  2. Diabetes 3. OSA: Severe on 5/16 sleep study.  Not yet  on CPAP.  4. Chronic diastolic CHF: Echo (2/16) with EF 60-65%, mild MR, moderately dilated RV with normal systolic function, PA systolic pressure 50 mmHg.  5. Atrial fibrillation: Now persistent.  He had successful TEE-guided DCCV in 2/16 in [place] but atrial fibrillation recurred not long after. DCCV 7/16 on amiodarone => atrial fibrillation quickly recurred.  6. 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  He is on home oxygen.  8. CAD: s/p PCI with DES in the 1990s.  No cardiac cath since 1990s.  9. Anemia 10. 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 12. Bradycardia 13. ECHO EF 08/2015 EF 65-70%   SH: Living with friends in [place], originally from [place] (moved to [place] in 2016).  Nonsmoker.  No ETOH.   FH: CAD  ROS: All systems reviewed and negative except as per HPI.   Current Outpatient Prescriptions  Medication Sig Dispense Refill  . acetaminophen (TYLENOL) 500 MG tablet Take 500 mg by mouth every 4 (four) hours as needed (pain). Do not exceed 4 gms of tylenol in 24 hours    . albuterol (PROVENTIL) (2.5 MG/3ML) 0.083% nebulizer solution Take 3 mLs (2.5 mg total) by nebulization every 6 (six) hours as needed for wheezing or shortness of breath. 75 mL 1  . atorvastatin (LIPITOR) 20 MG tablet Take 20 mg by mouth at bedtime.     . cholecalciferol (  VITAMIN D) 1000 UNITS tablet Take 1,000 Units by mouth daily.    Marland Kitchen gabapentin (NEURONTIN) 300 MG capsule Take 300 mg by mouth 2 (two) times daily.     Marland Kitchen HUMALOG KWIKPEN 100 UNIT/ML KiwkPen Inject 0.3-0.4 mLs (30-40 Units total) into the skin 3 (three) times daily. Inject 3 time daily just before each meal 40-30-40    . Insulin Glargine (LANTUS SOLOSTAR) 100 UNIT/ML Solostar Pen Inject 40 Units into the skin at bedtime.     . Magnesium 400 MG CAPS Take 400 mg by mouth daily.    . niacin 250 MG tablet Take 250 mg by mouth daily.    . Omega-3 Fatty Acids (FISH OIL) 1000 MG CAPS Take 2,000 mg by mouth daily.    . potassium chloride SA (K-DUR,KLOR-CON) 20 MEQ tablet Take 1 tablet  (20 mEq total) by mouth 2 (two) times a week. Mon / Fri    . PROAIR HFA 108 (90 BASE) MCG/ACT inhaler INHALE 1 TO 2 PUFFS BY MOUTH FOUR TIMES DAILY AS DIRECTED AS NEEDED FOR WHEEZING AND SHORTNESS OF BREATH 8.5 g 0  . Rivaroxaban (XARELTO) 15 MG TABS tablet Take 1 tablet (15 mg total) by mouth daily with supper.    . torsemide (DEMADEX) 20 MG tablet Take 2 tablets (40 mg total) by mouth 2 (two) times daily. Do not take on 10/21/15 & 10/22/15. Start taking on 10/23/15.     No current facility-administered medications for this encounter.   BP 124/62 mmHg  Pulse 72  Wt 308 lb (139.708 kg)  SpO2 95% General: NAD, obese. Arrived in wheelchair. Friend with him.  Neck: Thick, no JVD, no thyromegaly or thyroid nodule.  Lungs: Slight crackles at bases bilaterally.  CV: Nondisplaced PMI.  Irregular S1/S2, no S3/S4, 1/6 SEM RUSB.  No edema.  No carotid bruit.  Normal pedal pulses.  Abdomen: Soft, nontender, no hepatosplenomegaly, no distention.  Skin: Intact without lesions or rashes.  Neurologic: Alert and oriented x 3.  Psych: Normal affect. Extremities: No clubbing or cyanosis. R and LLE cool. Weak pedal pulse.  HEENT: Normal.   Assessment/Plan: 1. Chronic diastolic CHF: EF 04-54% on last echo with RV enlargement.  PA pressure elevated by echo.  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   NYHA III. Volume status stable. Diuretics restarted 1/30. Continue torsemide 60 mg in am and 40 mg in pm.  - Off metolazone for now.   2. Pulmonary hypertension: Elevated PA pressure on last echo.  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  Uses oxygen as needed at home 3. Atrial fibrillation: This has been persistent, probably since 2/16.  He has remained in atrial fibrillation despite  DCCV attempt 7/16 on amiodarone.  Off Amiodarone and Coreg due to bradycardia. - Increase  Xarelto  20  mg daily (GFR < 50).  4. OSA/suspected OHS: Continue oxygen continuously.  He will need to start on CPAP given severe OSA. Need to get him a pulmonary appointment.  5. CAD: Stable, no chest pain.  He is on Xarelto with stable CAD so not on aspirin.  Continue statin, good lipids earlier this year. Will need to increase Xarelto to 20 mg daily. GFR >50 6. CKD Stage III: Creatinine baseline 1.5--1.7 Most recent creatinine 1.9. Check BMET next visit.  7. Uncontrolled DM:  Endocrinology  following per pt. 8. R and LLE - cool Check ABIs.   Refer to [facility]. Not eligible for [facility].    Follow up in 4 weeks with Dr Shirlee Latch.   Tayjah Lobdell NP-C  10/24/2015

## 2015-10-23 NOTE — Telephone Encounter (Signed)
Called pt to make TCM appt no answer LMOM RTC.../lmb 

## 2015-10-24 ENCOUNTER — Ambulatory Visit (HOSPITAL_COMMUNITY)
Admission: RE | Admit: 2015-10-24 | Discharge: 2015-10-24 | Disposition: A | Discharge status: Home / Self Care | Payer: Medicare Other | Source: Ambulatory Visit | Attending: Internal Medicine | Admitting: Internal Medicine

## 2015-10-24 VITALS — BP 124/62 | HR 72 | Wt 308.0 lb

## 2015-10-24 DIAGNOSIS — Z9981 Dependence on supplemental oxygen: Secondary | ICD-10-CM | POA: Insufficient documentation

## 2015-10-24 DIAGNOSIS — I481 Persistent atrial fibrillation: Secondary | ICD-10-CM | POA: Diagnosis not present

## 2015-10-24 DIAGNOSIS — I5032 Chronic diastolic (congestive) heart failure: Secondary | ICD-10-CM | POA: Diagnosis present

## 2015-10-24 DIAGNOSIS — I4892 Unspecified atrial flutter: Secondary | ICD-10-CM

## 2015-10-24 DIAGNOSIS — N183 Chronic kidney disease, stage 3 unspecified: Secondary | ICD-10-CM

## 2015-10-24 DIAGNOSIS — Z79899 Other long term (current) drug therapy: Secondary | ICD-10-CM | POA: Insufficient documentation

## 2015-10-24 DIAGNOSIS — Z8249 Family history of ischemic heart disease and other diseases of the circulatory system: Secondary | ICD-10-CM | POA: Insufficient documentation

## 2015-10-24 DIAGNOSIS — E1165 Type 2 diabetes mellitus with hyperglycemia: Secondary | ICD-10-CM | POA: Insufficient documentation

## 2015-10-24 DIAGNOSIS — E1122 Type 2 diabetes mellitus with diabetic chronic kidney disease: Secondary | ICD-10-CM | POA: Insufficient documentation

## 2015-10-24 DIAGNOSIS — R609 Edema, unspecified: Secondary | ICD-10-CM | POA: Diagnosis not present

## 2015-10-24 DIAGNOSIS — Z794 Long term (current) use of insulin: Secondary | ICD-10-CM | POA: Diagnosis not present

## 2015-10-24 DIAGNOSIS — Z96652 Presence of left artificial knee joint: Secondary | ICD-10-CM | POA: Diagnosis not present

## 2015-10-24 DIAGNOSIS — G4733 Obstructive sleep apnea (adult) (pediatric): Secondary | ICD-10-CM

## 2015-10-24 DIAGNOSIS — I272 Other secondary pulmonary hypertension: Secondary | ICD-10-CM | POA: Insufficient documentation

## 2015-10-24 DIAGNOSIS — I130 Hypertensive heart and chronic kidney disease with heart failure and stage 1 through stage 4 chronic kidney disease, or unspecified chronic kidney disease: Secondary | ICD-10-CM | POA: Insufficient documentation

## 2015-10-24 DIAGNOSIS — Z7902 Long term (current) use of antithrombotics/antiplatelets: Secondary | ICD-10-CM | POA: Insufficient documentation

## 2015-10-24 DIAGNOSIS — I2510 Atherosclerotic heart disease of native coronary artery without angina pectoris: Secondary | ICD-10-CM | POA: Insufficient documentation

## 2015-10-24 MED ORDER — RIVAROXABAN 20 MG PO TABS: 20.0000 mg | ORAL_TABLET | Freq: Every day | ORAL | Status: AC

## 2015-10-24 NOTE — Progress Notes (Signed)
Advanced Heart Failure Medication Review by a Pharmacist  Does the patient  feel that his/her medications are working for him/her?  yes  Has the patient been experiencing any side effects to the medications prescribed?  no  Does the patient measure his/her own blood pressure or blood glucose at home?  yes   Does the patient have any problems obtaining medications due to transportation or finances?   no  Understanding of regimen: good Understanding of indications: good Potential of compliance: good Patient understands to avoid NSAIDs. Patient understands to avoid decongestants.  Issues to address at subsequent visits: None   Pharmacist comments:  Mr. Seda is a 70 yo M presenting with his caregiver but without a medication list. He reports good compliance with his regimen and did not have any specific medication-related questions or concerns for me at this time. I did notice that based on his last BMET, his CrCl is estimated to be ~72 ml/min so his dose of Xarelto should be at 20 mg daily. Will relay this information to provider.   Tyler Deis. Bonnye Fava, PharmD, BCPS, CPP Clinical Pharmacist Pager: [phone] Phone: [phone] 10/24/2015 3:39 PM      Time with patient: 10 minutes Preparation and documentation time: 2 minutes Total time: 12 minutes

## 2015-10-24 NOTE — Patient Instructions (Signed)
INCREASE Xarelto , one tab daily  Your physician has requested that you have an ankle brachial index (ABI). During this test an ultrasound and blood pressure cuff are used to evaluate the arteries that supply the arms and legs with blood. Allow thirty minutes for this exam. There are no restrictions or special instructions.  Your physician recommends that you schedule a follow-up appointment in: 4 weeks with Dr.McLean

## 2015-10-25 NOTE — Telephone Encounter (Signed)
Called pt again still no answer LMOM need to schedule hosp f/u ASAP...Mark Roberson

## 2015-10-26 ENCOUNTER — Other Ambulatory Visit (HOSPITAL_COMMUNITY): Payer: Self-pay | Admitting: Adult Health

## 2015-10-26 ENCOUNTER — Other Ambulatory Visit: Payer: Self-pay | Admitting: Cardiology

## 2015-10-26 DIAGNOSIS — I739 Peripheral vascular disease, unspecified: Secondary | ICD-10-CM

## 2015-10-26 NOTE — Telephone Encounter (Signed)
Called pt again still no answer LMOM need to schedule hosp f/u. 3rd attempt no success closing encounter...Raechel Chute

## 2015-10-26 NOTE — Care Management Obs Status (Addendum)
MEDICARE OBSERVATION STATUS NOTIFICATION Late entry, obs notification given 10/20/2015.  Patient Details  Name: Guenther Dunshee MRN: [id number] Date of Birth: 01-06-1946   Medicare Observation Status Notification Given:  Yes    Lula Michaux, Annamarie Major, RN 10/26/2015, 10:01 AM

## 2015-10-27 ENCOUNTER — Institutional Professional Consult (permissible substitution): Payer: Self-pay | Admitting: Internal Medicine

## 2015-11-01 ENCOUNTER — Other Ambulatory Visit: Payer: Self-pay | Admitting: Internal Medicine

## 2015-11-01 ENCOUNTER — Ambulatory Visit (HOSPITAL_COMMUNITY)
Admission: RE | Admit: 2015-11-01 | Discharge: 2015-11-01 | Disposition: A | Discharge status: Home / Self Care | Payer: Medicare Other | Source: Ambulatory Visit | Attending: Internal Medicine | Admitting: Internal Medicine

## 2015-11-01 DIAGNOSIS — R938 Abnormal findings on diagnostic imaging of other specified body structures: Secondary | ICD-10-CM | POA: Diagnosis not present

## 2015-11-01 DIAGNOSIS — I129 Hypertensive chronic kidney disease with stage 1 through stage 4 chronic kidney disease, or unspecified chronic kidney disease: Secondary | ICD-10-CM | POA: Diagnosis not present

## 2015-11-01 DIAGNOSIS — N183 Chronic kidney disease, stage 3 (moderate): Secondary | ICD-10-CM | POA: Insufficient documentation

## 2015-11-01 DIAGNOSIS — I739 Peripheral vascular disease, unspecified: Secondary | ICD-10-CM | POA: Diagnosis present

## 2015-11-01 DIAGNOSIS — E1122 Type 2 diabetes mellitus with diabetic chronic kidney disease: Secondary | ICD-10-CM | POA: Diagnosis not present

## 2015-11-07 ENCOUNTER — Telehealth: Payer: Self-pay | Admitting: Internal Medicine

## 2015-11-07 NOTE — Telephone Encounter (Signed)
Wanted to follow up on oxygen order sent over on 1/25 and again on 2/10.

## 2015-11-09 ENCOUNTER — Telehealth: Payer: Self-pay

## 2015-11-09 ENCOUNTER — Inpatient Hospital Stay: Payer: Self-pay | Admitting: Internal Medicine

## 2015-11-09 DIAGNOSIS — I5032 Chronic diastolic (congestive) heart failure: Secondary | ICD-10-CM | POA: Diagnosis not present

## 2015-11-09 NOTE — Telephone Encounter (Signed)
Paperwork signed, faxed, copy sent to scan 

## 2015-11-09 NOTE — Telephone Encounter (Signed)
Home Health Cert/Plan of Care (10/05/2015 - 12/03/2015) received and placed on MD's desk for signature

## 2015-11-09 NOTE — Telephone Encounter (Signed)
Tried to call patient.

## 2015-11-13 ENCOUNTER — Inpatient Hospital Stay (HOSPITAL_COMMUNITY)
Admission: EM | Admit: 2015-11-13 | Discharge: 2015-11-16 | DRG: 918 | Disposition: A | Discharge status: Skilled Nursing Facility | Payer: Medicare Other | Attending: Internal Medicine | Admitting: Internal Medicine

## 2015-11-13 ENCOUNTER — Encounter (HOSPITAL_COMMUNITY): Payer: Self-pay | Admitting: Vascular Surgery

## 2015-11-13 ENCOUNTER — Emergency Department (HOSPITAL_COMMUNITY): Payer: Medicare Other

## 2015-11-13 DIAGNOSIS — I130 Hypertensive heart and chronic kidney disease with heart failure and stage 1 through stage 4 chronic kidney disease, or unspecified chronic kidney disease: Secondary | ICD-10-CM | POA: Diagnosis present

## 2015-11-13 DIAGNOSIS — E860 Dehydration: Secondary | ICD-10-CM | POA: Diagnosis present

## 2015-11-13 DIAGNOSIS — E871 Hypo-osmolality and hyponatremia: Secondary | ICD-10-CM | POA: Diagnosis not present

## 2015-11-13 DIAGNOSIS — J9611 Chronic respiratory failure with hypoxia: Secondary | ICD-10-CM | POA: Diagnosis present

## 2015-11-13 DIAGNOSIS — T502X1A Poisoning by carbonic-anhydrase inhibitors, benzothiadiazides and other diuretics, accidental (unintentional), initial encounter: Secondary | ICD-10-CM | POA: Diagnosis not present

## 2015-11-13 DIAGNOSIS — R5381 Other malaise: Secondary | ICD-10-CM | POA: Diagnosis not present

## 2015-11-13 DIAGNOSIS — N179 Acute kidney failure, unspecified: Secondary | ICD-10-CM | POA: Diagnosis not present

## 2015-11-13 DIAGNOSIS — I951 Orthostatic hypotension: Secondary | ICD-10-CM | POA: Diagnosis not present

## 2015-11-13 DIAGNOSIS — E785 Hyperlipidemia, unspecified: Secondary | ICD-10-CM | POA: Diagnosis present

## 2015-11-13 DIAGNOSIS — E1129 Type 2 diabetes mellitus with other diabetic kidney complication: Secondary | ICD-10-CM | POA: Diagnosis present

## 2015-11-13 DIAGNOSIS — D638 Anemia in other chronic diseases classified elsewhere: Secondary | ICD-10-CM | POA: Diagnosis present

## 2015-11-13 DIAGNOSIS — I5032 Chronic diastolic (congestive) heart failure: Secondary | ICD-10-CM | POA: Diagnosis present

## 2015-11-13 DIAGNOSIS — J45909 Unspecified asthma, uncomplicated: Secondary | ICD-10-CM | POA: Diagnosis present

## 2015-11-13 DIAGNOSIS — E1122 Type 2 diabetes mellitus with diabetic chronic kidney disease: Secondary | ICD-10-CM | POA: Diagnosis present

## 2015-11-13 DIAGNOSIS — I959 Hypotension, unspecified: Secondary | ICD-10-CM | POA: Diagnosis present

## 2015-11-13 DIAGNOSIS — Z7901 Long term (current) use of anticoagulants: Secondary | ICD-10-CM

## 2015-11-13 DIAGNOSIS — Z794 Long term (current) use of insulin: Secondary | ICD-10-CM

## 2015-11-13 DIAGNOSIS — Z66 Do not resuscitate: Secondary | ICD-10-CM | POA: Diagnosis present

## 2015-11-13 DIAGNOSIS — I509 Heart failure, unspecified: Secondary | ICD-10-CM

## 2015-11-13 DIAGNOSIS — E039 Hypothyroidism, unspecified: Secondary | ICD-10-CM | POA: Diagnosis present

## 2015-11-13 DIAGNOSIS — D62 Acute posthemorrhagic anemia: Secondary | ICD-10-CM | POA: Diagnosis present

## 2015-11-13 DIAGNOSIS — I272 Other secondary pulmonary hypertension: Secondary | ICD-10-CM | POA: Diagnosis present

## 2015-11-13 DIAGNOSIS — Z6841 Body Mass Index (BMI) 40.0 and over, adult: Secondary | ICD-10-CM

## 2015-11-13 DIAGNOSIS — R531 Weakness: Secondary | ICD-10-CM | POA: Diagnosis not present

## 2015-11-13 DIAGNOSIS — N183 Chronic kidney disease, stage 3 (moderate): Secondary | ICD-10-CM | POA: Diagnosis present

## 2015-11-13 DIAGNOSIS — G4733 Obstructive sleep apnea (adult) (pediatric): Secondary | ICD-10-CM | POA: Diagnosis present

## 2015-11-13 DIAGNOSIS — E118 Type 2 diabetes mellitus with unspecified complications: Secondary | ICD-10-CM | POA: Insufficient documentation

## 2015-11-13 DIAGNOSIS — Z79899 Other long term (current) drug therapy: Secondary | ICD-10-CM

## 2015-11-13 DIAGNOSIS — Z96652 Presence of left artificial knee joint: Secondary | ICD-10-CM | POA: Diagnosis present

## 2015-11-13 DIAGNOSIS — E872 Acidosis: Secondary | ICD-10-CM | POA: Diagnosis present

## 2015-11-13 DIAGNOSIS — E1142 Type 2 diabetes mellitus with diabetic polyneuropathy: Secondary | ICD-10-CM | POA: Diagnosis present

## 2015-11-13 DIAGNOSIS — I952 Hypotension due to drugs: Secondary | ICD-10-CM | POA: Diagnosis present

## 2015-11-13 DIAGNOSIS — N189 Chronic kidney disease, unspecified: Secondary | ICD-10-CM

## 2015-11-13 DIAGNOSIS — I2510 Atherosclerotic heart disease of native coronary artery without angina pectoris: Secondary | ICD-10-CM | POA: Diagnosis present

## 2015-11-13 DIAGNOSIS — I482 Chronic atrial fibrillation: Secondary | ICD-10-CM | POA: Diagnosis present

## 2015-11-13 DIAGNOSIS — Z9861 Coronary angioplasty status: Secondary | ICD-10-CM

## 2015-11-13 DIAGNOSIS — Z9981 Dependence on supplemental oxygen: Secondary | ICD-10-CM

## 2015-11-13 DIAGNOSIS — K219 Gastro-esophageal reflux disease without esophagitis: Secondary | ICD-10-CM | POA: Diagnosis present

## 2015-11-13 DIAGNOSIS — I9589 Other hypotension: Secondary | ICD-10-CM | POA: Diagnosis not present

## 2015-11-13 LAB — CBC WITH DIFFERENTIAL/PLATELET
Basophils Absolute: 0 10*3/uL (ref 0.0–0.1)
Basophils Relative: 0 %
EOS PCT: 2 %
Eosinophils Absolute: 0.1 10*3/uL (ref 0.0–0.7)
HCT: 27.9 % — ABNORMAL LOW (ref 39.0–52.0)
Hemoglobin: 9 g/dL — ABNORMAL LOW (ref 13.0–17.0)
LYMPHS ABS: 0.8 10*3/uL (ref 0.7–4.0)
LYMPHS PCT: 11 %
MCH: 28 pg (ref 26.0–34.0)
MCHC: 32.3 g/dL (ref 30.0–36.0)
MCV: 86.6 fL (ref 78.0–100.0)
MONO ABS: 1.1 10*3/uL — AB (ref 0.1–1.0)
MONOS PCT: 15 %
Neutro Abs: 5.1 10*3/uL (ref 1.7–7.7)
Neutrophils Relative %: 72 %
PLATELETS: 175 10*3/uL (ref 150–400)
RBC: 3.22 MIL/uL — ABNORMAL LOW (ref 4.22–5.81)
RDW: 16.4 % — AB (ref 11.5–15.5)
WBC: 7.1 10*3/uL (ref 4.0–10.5)

## 2015-11-13 LAB — URINALYSIS, ROUTINE W REFLEX MICROSCOPIC
Bilirubin Urine: NEGATIVE
Glucose, UA: NEGATIVE mg/dL
Hgb urine dipstick: NEGATIVE
Ketones, ur: NEGATIVE mg/dL
Leukocytes, UA: NEGATIVE
Nitrite: NEGATIVE
Protein, ur: NEGATIVE mg/dL
Specific Gravity, Urine: 1.009 (ref 1.005–1.030)
pH: 5.5 (ref 5.0–8.0)

## 2015-11-13 LAB — COMPREHENSIVE METABOLIC PANEL WITH GFR
ALT: 9 U/L — ABNORMAL LOW (ref 17–63)
AST: 18 U/L (ref 15–41)
Albumin: 3.2 g/dL — ABNORMAL LOW (ref 3.5–5.0)
Alkaline Phosphatase: 47 U/L (ref 38–126)
Anion gap: 16 — ABNORMAL HIGH (ref 5–15)
BUN: 100 mg/dL — ABNORMAL HIGH (ref 6–20)
CO2: 26 mmol/L (ref 22–32)
Calcium: 9.4 mg/dL (ref 8.9–10.3)
Chloride: 94 mmol/L — ABNORMAL LOW (ref 101–111)
Creatinine, Ser: 3.42 mg/dL — ABNORMAL HIGH (ref 0.61–1.24)
GFR calc Af Amer: 20 mL/min — ABNORMAL LOW (ref >60)
GFR calc non Af Amer: 17 mL/min — ABNORMAL LOW (ref >60)
Glucose, Bld: 100 mg/dL — ABNORMAL HIGH (ref 65–99)
Potassium: 3.9 mmol/L (ref 3.5–5.1)
Sodium: 136 mmol/L (ref 135–145)
Total Bilirubin: 0.4 mg/dL (ref 0.3–1.2)
Total Protein: 6.2 g/dL — ABNORMAL LOW (ref 6.5–8.1)

## 2015-11-13 LAB — I-STAT TROPONIN, ED: TROPONIN I, POC: 0.04 ng/mL (ref 0.00–0.08)

## 2015-11-13 LAB — CBG MONITORING, ED
GLUCOSE-CAPILLARY: 106 mg/dL — AB (ref 65–99)
Glucose-Capillary: 169 mg/dL — ABNORMAL HIGH (ref 65–99)

## 2015-11-13 LAB — POC OCCULT BLOOD, ED: Fecal Occult Bld: POSITIVE — AB

## 2015-11-13 MED ORDER — ATORVASTATIN CALCIUM 20 MG PO TABS
20.0000 mg | ORAL_TABLET | Freq: Every day | ORAL | Status: DC
  Administered 2015-11-13 – 2015-11-15 (×3): 20 mg via ORAL
  Filled 2015-11-13 (×3): qty 1

## 2015-11-13 MED ORDER — INSULIN ASPART 100 UNIT/ML ~~LOC~~ SOLN
0.0000 [IU] | Freq: Three times a day (TID) | SUBCUTANEOUS | Status: DC
Start: 2015-11-13 — End: 2015-11-16
  Administered 2015-11-13: 2 [IU] via SUBCUTANEOUS
  Administered 2015-11-14: 5 [IU] via SUBCUTANEOUS
  Administered 2015-11-14: 2 [IU] via SUBCUTANEOUS
  Administered 2015-11-14: 18:00:00 5 [IU] via SUBCUTANEOUS
  Administered 2015-11-15 (×2): 3 [IU] via SUBCUTANEOUS
  Administered 2015-11-15: 18:00:00 5 [IU] via SUBCUTANEOUS
  Administered 2015-11-16: 3 [IU] via SUBCUTANEOUS
  Administered 2015-11-16: 5 [IU] via SUBCUTANEOUS
  Filled 2015-11-13: qty 1

## 2015-11-13 MED ORDER — GABAPENTIN 300 MG PO CAPS
300.0000 mg | ORAL_CAPSULE | Freq: Two times a day (BID) | ORAL | Status: DC
  Administered 2015-11-13 – 2015-11-16 (×6): 300 mg via ORAL
  Filled 2015-11-13 (×6): qty 1

## 2015-11-13 MED ORDER — ONDANSETRON HCL 4 MG PO TABS: 4.0000 mg | ORAL_TABLET | Freq: Four times a day (QID) | ORAL | Status: DC | PRN

## 2015-11-13 MED ORDER — ONDANSETRON HCL 4 MG/2ML IJ SOLN
4.0000 mg | Freq: Four times a day (QID) | INTRAMUSCULAR | Status: DC | PRN
Start: 2015-11-13 — End: 2015-11-16

## 2015-11-13 MED ORDER — INSULIN GLARGINE 100 UNIT/ML ~~LOC~~ SOLN
30.0000 [IU] | Freq: Every day | SUBCUTANEOUS | Status: DC
  Administered 2015-11-13 – 2015-11-15 (×3): 30 [IU] via SUBCUTANEOUS
  Filled 2015-11-13 (×4): qty 0.3

## 2015-11-13 MED ORDER — SODIUM CHLORIDE 0.9 % IV SOLN
INTRAVENOUS | Status: DC
  Administered 2015-11-13: 21:00:00 via INTRAVENOUS

## 2015-11-13 MED ORDER — RIVAROXABAN 15 MG PO TABS
15.0000 mg | ORAL_TABLET | Freq: Every day | ORAL | Status: DC
  Administered 2015-11-14 – 2015-11-16 (×3): 15 mg via ORAL
  Filled 2015-11-13 (×4): qty 1

## 2015-11-13 MED ORDER — RIVAROXABAN 20 MG PO TABS: 20.0000 mg | ORAL_TABLET | Freq: Every day | ORAL | Status: DC

## 2015-11-13 MED ORDER — POTASSIUM CHLORIDE CRYS ER 20 MEQ PO TBCR: 20.0000 meq | EXTENDED_RELEASE_TABLET | ORAL | Status: DC

## 2015-11-13 MED ORDER — SODIUM CHLORIDE 0.9 % IV BOLUS (SEPSIS)
1000.0000 mL | Freq: Once | INTRAVENOUS | Status: AC
Start: 2015-11-13 — End: 2015-11-13
  Administered 2015-11-13: 1000 mL via INTRAVENOUS

## 2015-11-13 MED ORDER — ALBUTEROL SULFATE (2.5 MG/3ML) 0.083% IN NEBU: 2.5000 mg | INHALATION_SOLUTION | Freq: Four times a day (QID) | RESPIRATORY_TRACT | Status: DC | PRN

## 2015-11-13 NOTE — ED Provider Notes (Signed)
CSN: [id number]     Arrival date & time 11/13/15  1229 History   First MD Initiated Contact with Patient 11/13/15 1238     Chief Complaint  Patient presents with  . Hypotension     (Consider location/radiation/quality/duration/timing/severity/associated sxs/prior Treatment) HPI  Mr. Marcelline Mates is a 70 year old male with a history of congestive heart failure who was recently discharged after admission with acute kidney failure. He has been fairly weak since discharge. Today he became very lightheaded and then had a very loose bowel movement had a near-syncopal episode. He took his blood pressure at home and it was systolically 50. Upon EMS arrival it was 68 systolically. Laying down his blood pressure has improved somewhat. He denies any pain except for his chronic neck pain. He does not think that he has had active bleeding but states he did have to get transfused recently. He did not note any blood in the stool. He has not been having ongoing vomiting or diarrhea. He feels that he had the one episode after he became weak and lightheaded. He is not short of breath or having chest pain.  Past Medical History  Diagnosis Date  . CHF (congestive heart failure) (HCC)   . Atrial fibrillation (HCC)   . Diabetes mellitus without complication (HCC)   . Hypertension   . Sleep apnea   . Asthma   . CKD (chronic kidney disease), stage III   . Anemia   Hospital Course:     1. Acute on stage III chronic kidney disease: Likely precipitated by over diuresis. Clinically appears euvolemic. Received 500 mL IV normal saline bolus in ED. Torsemide has been decreased from 60 MG to 40 MG twice a day. Zaroxolyn discontinued for now. Creatinine has improved from 2.9 > 2.56 >2.29 >1.91. Close outpatient follow-up with the advanced heart failure clinic on 1/31 with repeat BMP. 2. Hypotension: Patient was about to be discharged home on 1/27 when he developed mildly symptomatic hypotension with systolic blood pressures in  the 80s-90s. Held torsemide dose since 1/27 PM and encouraged increased oral fluid intake. Blood pressures have since normalized. Discussed with cardiologist with the advanced heart failure team who advised holding off torsemide for today and tomorrow and resume at reduced dose of 40 mg twice a day from 10/23/15. This was likely secondary to intravascular volume depletion from ongoing diuresis. No further dizziness reported. 3. Neck pain: Probably musculoskeletal and has resolved. 4. Chronic diastolic CHF: LVEF 65-70 percent recently. Diuretic management as per problem #1. Patient follows with Dr. Marca Ancona. Diuretic management as indicated in problem #2 . Outpatient follow-up on 1/31. Diuretic regimen can further be adjusted during that follow-up. Office weight: 308 pounds. Admitted weight: 299 pounds. -2.7 L since admission. 5. Paroxysmal atrial fibrillation/slow ventricular response: chads 2 vasc score is 6. Continue Xarelto. 6. Type II DM with renal complications: Uncontrolled and fluctuating CBGs. Continue home dose of insulin's. Outpatient follow-up with PCP. 7. Chronic anemia: Stable 8. OSA: Non-compliant with CPAP 9. Hypothyroid: Continue Synthroid 10. Chronic respiratory failure with hypoxia: Patient on 2 L/m oxygen via nasal cannula continuously. Probably due to OSA and chronic CHF. 11. Hyponatremia: Likely related to CHF and diuretic use. Follow-up BMP on 1/31 as outpatient. Holding diuretics for a couple days.    Consultants:  None Discharge Diagnoses:  Principal Problem:   Renal failure (ARF), acute on chronic (HCC) Active Problems:   HTN (hypertension)   DM (diabetes mellitus), type 2 with renal complications (HCC)   Hypothyroidism  Chronic diastolic CHF (congestive heart failure) (HCC)   Discharge Condition: Improved & Stable  Diet recommendation: Heart healthy and diabetic diet.    Filed Weights     10/19/15 1929  10/20/15 1200  10/21/15 0150   Weight:  139.2 kg  (306 lb 14.1 oz)  135.807 kg (299 lb 6.4 oz)  137.8 kg (303 lb 12.7 oz)     History of present illness:   70 year old male with history of chronic diastolic CHF, OSA, DM, stage III chronic kidney disease, PAF, HTN, CAD and anemia presented to the [facility] ED on 10/18/15 with complaints of neck pain which resolved in the ED. He also complained of feeling generally weak. Workup revealed worsening creatinine from baseline of 1.8 > 2.9. He was admitted for evaluation and management of acute on chronic kidney disease. Patient was discharged on 09/22/15 with weight around 308 pounds and now admitted with weight 299 pounds. He has been on high-dose diuretics (torsemide and Zaroxolyn).  Hospital Course:   Past Surgical History  Procedure Laterality Date  . 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 Wound debridement Right   . Multiple extractions with alveoloplasty N/A 01/17/2015    Procedure: Extraction of tooth #'s 14,15, 16 with alveolopalsty;  Surgeon: Charlynne Pander, DDS;  Location: [facility] OR;  Service: Oral Surgery;  Laterality: N/A;  . Cardioversion N/A 04/03/2015    Procedure: CARDIOVERSION;  Surgeon: Laurey Morale, MD;  Location: [facility] ENDOSCOPY;  Service: Cardiovascular;  Laterality: N/A;   Family History  Problem Relation Age of Onset  . COPD Mother   . Heart disease Father   . Cancer Sister     breast  . Heart disease Brother   . Diabetes Neg Hx    Social History  Substance Use Topics  . Smoking status: Never Smoker   . Smokeless tobacco: Never Used  . Alcohol Use: Yes     Comment: rare    Review of Systems  All other systems reviewed and are negative.     Allergies  Percocet and Penicillins  Home Medications   Prior to Admission medications   Medication Sig Start Date End Date Taking? Authorizing Provider  acetaminophen (TYLENOL) 500 MG tablet Take 500 mg by mouth every 4 (four) hours as needed (pain). Do not exceed 4 gms of tylenol in 24 hours    Historical Provider, MD   albuterol (PROVENTIL) (2.5 MG/3ML) 0.083% nebulizer solution Take 3 mLs (2.5 mg total) by nebulization every 6 (six) hours as needed for wheezing or shortness of breath. 11/14/14   [name], MD  atorvastatin (LIPITOR) 20 MG tablet Take 20 mg by mouth at bedtime.     Historical Provider, MD  B-D ULTRAFINE III SHORT PEN 31G X 8 MM MISC USE AS DIRECTED. THREE TIMES DAILY 11/01/15   [name], MD  cholecalciferol (VITAMIN D) 1000 UNITS tablet Take 1,000 Units by mouth daily.    Historical Provider, MD  gabapentin (NEURONTIN) 300 MG capsule Take 300 mg by mouth 2 (two) times daily.     Historical Provider, MD  HUMALOG KWIKPEN 100 UNIT/ML KiwkPen Inject 0.3-0.4 mLs (30-40 Units total) into the skin 3 (three) times daily. Inject 3 time daily just before each meal 40-30-40 10/20/15   Elease Etienne, MD  Insulin Glargine (LANTUS SOLOSTAR) 100 UNIT/ML Solostar Pen Inject 40 Units into the skin at bedtime.     Historical Provider, MD  Magnesium 400 MG CAPS Take 400 mg by mouth  daily.    Historical Provider, MD  niacin 250 MG tablet Take 250 mg by mouth daily.    Historical Provider, MD  Omega-3 Fatty Acids (FISH OIL) 1000 MG CAPS Take 2,000 mg by mouth daily.    Historical Provider, MD  potassium chloride SA (K-DUR,KLOR-CON) 20 MEQ tablet Take 1 tablet (20 mEq total) by mouth 2 (two) times a week. Mon / Caleen Essex 10/20/15   Elease Etienne, MD  PROAIR HFA 108 (90 BASE) MCG/ACT inhaler INHALE 1 TO 2 PUFFS BY MOUTH FOUR TIMES DAILY AS DIRECTED AS NEEDED FOR WHEEZING AND SHORTNESS OF BREATH 04/06/15   [name], MD  rivaroxaban (XARELTO) 20 MG TABS tablet Take 1 tablet (20 mg total) by mouth daily with supper. 10/24/15   Amy D Clegg, NP  torsemide (DEMADEX) 20 MG tablet Take 2 tablets (40 mg total) by mouth 2 (two) times daily. Do not take on 10/21/15 & 10/22/15. Start taking on 10/23/15. 10/23/15   Elease Etienne, MD   BP 69/45 mmHg  Pulse 68  Temp(Src) 98 F (36.7 C) (Oral)  Resp 13   SpO2 99% Physical Exam  Constitutional: He is oriented to person, place, and time. He appears well-developed and well-nourished. No distress.  HENT:  Head: Normocephalic and atraumatic.  Right Ear: External ear normal.  Left Ear: External ear normal.  Nose: Nose normal.  Mouth/Throat: Oropharynx is clear and moist.  Eyes: Conjunctivae are normal. Pupils are equal, round, and reactive to light.  Conjunctiva pale  Neck: Normal range of motion. Neck supple.  Cardiovascular: Normal rate, regular rhythm, normal heart sounds and intact distal pulses.   Pulmonary/Chest: Effort normal and breath sounds normal.  Abdominal: Soft. Bowel sounds are normal.  Musculoskeletal: He exhibits no edema or tenderness.  Neurological: He is alert and oriented to person, place, and time. He has normal reflexes.  Skin: Skin is warm and dry. No rash noted. No erythema. There is pallor.  Psychiatric: He has a normal mood and affect.  Nursing note and vitals reviewed.   ED Course  Procedures (including critical care time) Labs Review Labs Reviewed  CBC WITH DIFFERENTIAL/PLATELET - Abnormal; Notable for the following:    RBC 3.22 (*)    Hemoglobin 9.0 (*)    HCT 27.9 (*)    RDW 16.4 (*)    Monocytes Absolute 1.1 (*)    All other components within normal limits  COMPREHENSIVE METABOLIC PANEL - Abnormal; Notable for the following:    Chloride 94 (*)    Glucose, Bld 100 (*)    BUN 100 (*)    Creatinine, Ser 3.42 (*)    Total Protein 6.2 (*)    Albumin 3.2 (*)    ALT 9 (*)    GFR calc non Af Amer 17 (*)    GFR calc Af Amer 20 (*)    Anion gap 16 (*)    All other components within normal limits  POC OCCULT BLOOD, ED - Abnormal; Notable for the following:    Fecal Occult Bld POSITIVE (*)    All other components within normal limits  URINALYSIS, ROUTINE W REFLEX MICROSCOPIC (NOT AT [facility])  Rosezena Sensor, ED    Imaging Review Dg Chest 1 View  11/13/2015  CLINICAL DATA:  Hypotension this am EXAM:  CHEST  1 VIEW COMPARISON:  10/18/2015 FINDINGS: Lungs are clear. Heart size and mediastinal contours are within normal limits. Left diaphragmatic leaflet is obscured as before. No effusion.  No pneumothorax. Visualized skeletal structures  are unremarkable. IMPRESSION: No acute cardiopulmonary disease. Electronically Signed   By: Corlis Leak M.D.   On: 11/13/2015 14:21   I have personally reviewed and evaluated these images and lab results as part of my medical decision-making.   EKG Interpretation   Date/Time:  Monday November 13 2015 12:35:15 EST Ventricular Rate:  57 PR Interval:    QRS Duration: 181 QT Interval:  502 QTC Calculation: 489 R Axis:   -67 Text Interpretation:  Atrial fibrillation Nonspecific IVCD with LAD  Borderline T abnormalities, lateral leads No significant change since last  tracing Confirmed by Steel Kerney MD, Duwayne Heck [phone]) on 11/13/2015 1:02:42 PM      MDM  This is a 70 year old male with a history of congestive heart failure who was recently discharged home after admission for worsening renal failure due to aggressive diuresis. His diuretics have been  decreased but patient presents today hypotensive with increased creatinine. Is receiving and IV fluid bolus while here in the ED. His blood pressure has improved from systolic of 63 to systolic of 604. He also had an episode of stooling on himself. He is heme positive with some decreased hemoglobin to 9.    Margarita Grizzle, MD 11/13/15 [phone]

## 2015-11-13 NOTE — ED Notes (Signed)
Pt sitting up eating dinner tray.

## 2015-11-13 NOTE — ED Notes (Signed)
Pt cbg 106 

## 2015-11-13 NOTE — H&P (Signed)
[facility] History and Physical  Pershing Skidmore ZOX:[id number] DOB: 02-04-1946 DOA: 11/13/2015  Referring physician:  PCP: [name], MD   Chief Complaint: Diarrhea and Dehydration   HPI: Mark Roberson is a 70 y.o. male  with a history of  Chronic diastolic CHF, atrial fibrillation on Xarelto, CAD, CKD stage III, Recently admitted to the hospital from 1/25 through 10/23/2015 for acute on chronic CKD, presenting to the ED with dizziness, dehydration, generalized weakness, poor oral intake and ongoing watery diarrhea. He denied any nausea or vomiting. He denied any worsening shortness of breath or chest pain. No confusion was reported. He had been evaluated at the heart failure clinic in 1/30 at which time he was found to be stable clinically. He was on torsemide bid. His weight was stable at 308 lbs. Since then, his weight had been trending down, to 294 pounds today. Today, he was found to be hypotensive with 68 SBP ( in the 50's prior to presentation) after over-diuresing at home, requiring IV fluids. Chest x-ray was negative for active disease.K was 3.9, Cr 3.42, BUN100, Tn 0.04, and Hb 9 with normal WBC at 7.1. His most recent echo on 09/12/2015 showed ejection fraction of 65-70%.EKG Afib without changes from prior,  QTC 489. Cardiology was consulted and his meds were adjusted, holding diuresis and replenishing fluids  .Review of Systems  Constitutional: Positive for weight loss and malaise/fatigue. Negative for fever, chills and diaphoresis.  HENT: Negative for congestion, hearing loss, nosebleeds and sore throat.   Eyes: Negative for blurred vision, double vision and photophobia.  Respiratory: Positive for sputum production and shortness of breath. Negative for cough, hemoptysis and wheezing.        On home O2  Cardiovascular: Positive for leg swelling. Negative for chest pain, palpitations, orthopnea, claudication and PND.  Gastrointestinal: Negative for heartburn,  nausea, vomiting, abdominal pain, diarrhea, constipation, blood in stool and melena.       One episode of massive loose stool without recurrence.  Genitourinary: Negative.   Musculoskeletal: Negative.   Skin: Negative for itching and rash.  Neurological: Positive for dizziness, sensory change and weakness. Negative for tingling, tremors, speech change, focal weakness, seizures, loss of consciousness and headaches.       Intermittent while dehydrated Diabetic neuropathy  Endo/Heme/Allergies: Negative.   Psychiatric/Behavioral: Negative.     Past Medical History  Diagnosis Date  . CHF (congestive heart failure) (HCC)   . Atrial fibrillation (HCC)   . Diabetes mellitus without complication (HCC)   . Hypertension   . Sleep apnea   . Asthma   . CKD (chronic kidney disease), stage III   . Anemia    Past Surgical History  Procedure Laterality Date  . 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 Wound debridement Right   . Multiple extractions with alveoloplasty N/A 01/17/2015    Procedure: Extraction of tooth #'s 14,15, 16 with alveolopalsty;  Surgeon: Charlynne Pander, DDS;  Location: [facility] OR;  Service: Oral Surgery;  Laterality: N/A;  . Cardioversion N/A 04/03/2015    Procedure: CARDIOVERSION;  Surgeon: Laurey Morale, MD;  Location: [facility] ENDOSCOPY;  Service: Cardiovascular;  Laterality: N/A;   Social History:  reports that he has never smoked. He has never used smokeless tobacco. He reports that he drinks alcohol. He reports that he does not use illicit drugs.  Allergies  Allergen Reactions  . Percocet [Oxycodone-Acetaminophen] Other (See Comments)    hallucination  . Penicillins Hives    Has  patient had a PCN reaction causing immediate rash, facial/tongue/throat swelling, SOB or lightheadedness with hypotension: No Has patient had a PCN reaction causing severe rash involving mucus membranes or skin necrosis: No Has patient had a PCN reaction that required hospitalization No Has patient  had a PCN reaction occurring within the last 10 years: No If all of the above answers are "NO", then may proceed with Cephalosporin use.    Family History  Problem Relation Age of Onset  . COPD Mother   . Heart disease Father   . Cancer Sister     breast  . Heart disease Brother   . Diabetes Neg Hx      Prior to Admission medications   Medication Sig Start Date End Date Taking? Authorizing Provider  acetaminophen (TYLENOL) 500 MG tablet Take 500 mg by mouth every 4 (four) hours as needed (pain). Do not exceed 4 gms of tylenol in 24 hours    Historical Provider, MD  albuterol (PROVENTIL) (2.5 MG/3ML) 0.083% nebulizer solution Take 3 mLs (2.5 mg total) by nebulization every 6 (six) hours as needed for wheezing or shortness of breath. 11/14/14   [name], MD  atorvastatin (LIPITOR) 20 MG tablet Take 20 mg by mouth at bedtime.     Historical Provider, MD  B-D ULTRAFINE III SHORT PEN 31G X 8 MM MISC USE AS DIRECTED. THREE TIMES DAILY 11/01/15   [name], MD  cholecalciferol (VITAMIN D) 1000 UNITS tablet Take 1,000 Units by mouth daily.    Historical Provider, MD  gabapentin (NEURONTIN) 300 MG capsule Take 300 mg by mouth 2 (two) times daily.     Historical Provider, MD  HUMALOG KWIKPEN 100 UNIT/ML KiwkPen Inject 0.3-0.4 mLs (30-40 Units total) into the skin 3 (three) times daily. Inject 3 time daily just before each meal 40-30-40 10/20/15   Elease Etienne, MD  Insulin Glargine (LANTUS SOLOSTAR) 100 UNIT/ML Solostar Pen Inject 40 Units into the skin at bedtime.     Historical Provider, MD  Magnesium 400 MG CAPS Take 400 mg by mouth daily.    Historical Provider, MD  niacin 250 MG tablet Take 250 mg by mouth daily.    Historical Provider, MD  Omega-3 Fatty Acids (FISH OIL) 1000 MG CAPS Take 2,000 mg by mouth daily.    Historical Provider, MD  potassium chloride SA (K-DUR,KLOR-CON) 20 MEQ tablet Take 1 tablet (20 mEq total) by mouth 2 (two) times a week. Mon / Caleen Essex 10/20/15    Elease Etienne, MD  PROAIR HFA 108 (90 BASE) MCG/ACT inhaler INHALE 1 TO 2 PUFFS BY MOUTH FOUR TIMES DAILY AS DIRECTED AS NEEDED FOR WHEEZING AND SHORTNESS OF BREATH 04/06/15   [name], MD  rivaroxaban (XARELTO) 20 MG TABS tablet Take 1 tablet (20 mg total) by mouth daily with supper. 10/24/15   Amy D Clegg, NP  torsemide (DEMADEX) 20 MG tablet Take 2 tablets (40 mg total) by mouth 2 (two) times daily. Do not take on 10/21/15 & 10/22/15. Start taking on 10/23/15. 10/23/15   Elease Etienne, MD   Physical Exam: Filed Vitals:   11/13/15 1430 11/13/15 1445 11/13/15 1510 11/13/15 1515  BP: 87/47 85/40 98/42  100/44  Pulse: 54 63 53   Temp:      TempSrc:      Resp: 14 12 16 15   SpO2: 100% 97% 100%     Wt Readings from Last 3 Encounters:  10/24/15 139.708 kg (308 lb)  10/21/15 137.8 kg (303  lb 12.7 oz)  09/22/15 139.708 kg (308 lb)    Physical Exam  Constitutional: He is oriented to person, place, and time. No distress.  Morbidly obese Chronically ill appearing  HENT:  Head: Atraumatic.  Mouth/Throat: Oropharynx is clear and moist. No oropharyngeal exudate.  Eyes: EOM are normal. Pupils are equal, round, and reactive to light. No scleral icterus.  Neck: Normal range of motion. Neck supple. No JVD present. No thyromegaly present.  Cardiovascular: Exam reveals no gallop and no friction rub.   No murmur heard. IRRR, bradycardic.  Pulmonary/Chest: No respiratory distress. He has no wheezes. He has rales. He exhibits no tenderness.  Trace rales at the bases  Abdominal: Soft. He exhibits no mass. There is tenderness.  Morbidly obese  Musculoskeletal: He exhibits edema. He exhibits no tenderness.  2+ bilateral lower extremity edema  Lymphadenopathy:    He has no cervical adenopathy.  Neurological: He is alert and oriented to person, place, and time. No cranial nerve deficit. He exhibits normal muscle tone. Coordination normal.  Skin: Skin is warm and dry. He is not diaphoretic.   Psychiatric: Memory, affect and judgment normal.  Flat affect            Labs on Admission:  Basic Metabolic Panel:  Recent Labs Lab 11/13/15 1333  NA 136  K 3.9  CL 94*  CO2 26  GLUCOSE 100*  BUN 100*  CREATININE 3.42*  CALCIUM 9.4    Liver Function Tests:  Recent Labs Lab 11/13/15 1333  AST 18  ALT 9*  ALKPHOS 47  BILITOT 0.4  PROT 6.2*  ALBUMIN 3.2*   No results for input(s): LIPASE, AMYLASE in the last 168 hours. No results for input(s): AMMONIA in the last 168 hours.  CBC:  Recent Labs Lab 11/13/15 1333  WBC 7.1  NEUTROABS 5.1  HGB 9.0*  HCT 27.9*  MCV 86.6  PLT 175    Cardiac Enzymes: No results for input(s): CKTOTAL, CKMB, CKMBINDEX, TROPONINI in the last 168 hours.  BNP (last 3 results)  Recent Labs  05/03/15 0516 05/25/15 1217 09/13/15 1815  BNP 284.3* 214.5* 280.4*    ProBNP (last 3 results) No results for input(s): PROBNP in the last 8760 hours.   Creatinine clearance cannot be calculated (Unknown ideal weight.)  CBG: No results for input(s): GLUCAP in the last 168 hours.  Radiological Exams on Admission: Dg Chest 1 View  11/13/2015  CLINICAL DATA:  Hypotension this am EXAM: CHEST  1 VIEW COMPARISON:  10/18/2015 FINDINGS: Lungs are clear. Heart size and mediastinal contours are within normal limits. Left diaphragmatic leaflet is obscured as before. No effusion.  No pneumothorax. Visualized skeletal structures are unremarkable. IMPRESSION: No acute cardiopulmonary disease. Electronically Signed   By: Corlis Leak M.D.   On: 11/13/2015 14:21      Assessment/Plan Principal Problem:   Hypotension Active Problems:   DM (diabetes mellitus), type 2 with renal complications (HCC)   AKI (acute kidney injury) (HCC)   Dyslipidemia   Hypothyroidism   On home oxygen therapy   CHF exacerbation (HCC)   Acute blood loss anemia   Renal failure (ARF), acute on chronic (HCC)   Chronic Kidney disease Stage III with acute kidney injury   likely due to overdiuresis and diarrhea withsubsequent hypotension.  Baseline creatinine is around 1.9. Peak creatinine today 3.42. He received 500 mL IV normal saline bolus in the ED. Torsemide decreased from 60-40 mg twice a day Cardiology has seen the patient today, and is to  follow-up with further recommendations. At this time, we will continue to follow-up during his hospitalization. Admit to stepdown Continue IVF  Renally dose medications  Minimize nephrotoxins and avoid ACEI/ARB   Hypotension due to overdiuresis, continuation of antihypertensive and volume the lesion from diarrhea  Patient arrived in ER with BP 67 septic shock doubted as patient is afebrile.No pneumothorax on CXR.Slight BP improvement with IVF with NS (and appropriate urine output), currently at 98/42. Med effect suspected as well. Cards held meds as above. Symptoms of dizziness are improving.  Continue supportive therapy with IVF  Antihypertensives on hold   Atrial Fibrillation despite DCCV attempt on 7/16 Amio and Coreg on hold due to bradycardia.  CHA2DS2-VASc = 5 Appreciate Cards follow up.   Diastolic CHF, current LVEF 65-70 percent Current weight is 299 from 308 lbs 3 weeks prior. Appreciate Cardiology input Lasix currently on hold  Strict I/O and daily weights Monitor intake and output Holding his ACE inhibitor for now   Anemia of chronic disease, baseline current Hb 9. Hemoccult was positive - Suspect  irritation from diarrhea no reported melena or hematochezia Continue Xarelto for now and will monitor closely.  No indication for transfusion at this time unless Hb less than 8 or acutely bleeding May consider Anemia panel   DM2, insulin dependent. A1C on 12/20 at 6.5 Will hold home oral diabetic medications.  Place on basal insulin regimen with Lantus 30 units qhs and sensitive SSI Place on renal carb modified diet.  Code Status:  DNR DVT Prophylaxis: Xarelto Family Communication: at  bedside Disposition Plan: Pending Improvement. Admitted for observation in stepdown bed. Expected LOS 24-48 hrs    [facility] E,PA-C [facility] [email or web address] Password [email or web address]

## 2015-11-13 NOTE — ED Notes (Signed)
Pt reports to the ED via [facility] for eval of hypotension from home where he stays with friends. For the past 2 weeks he has been trying to live independently and his friends report he has not been eating much, taking his medication, or getting up out of the bed. Pt was able to ambulate to the bathroom with a walker and had a large episode of diarrhea and when he stood up he became lightheaded. Denies full syncopal episode or fall. Denies any diaphoresis, CP, SOB, N/V, fever, or chills. Does report some generalized weakness and neck pain x several weeks. Pt was hypotensive in the 70s-80s systolic en route. Pt A&Ox4, resp e/u, and skin cool, pale,and dry. Pt wears 2 L of home PRN.

## 2015-11-13 NOTE — ED Notes (Signed)
Carb modified/renal tray ordered

## 2015-11-13 NOTE — Consult Note (Signed)
Advanced Heart Failure Team Consult Note  Referring Physician:  Dr Rosalia Hammers Primary Physician: Dr Dorise Hiss  Primary Cardiologist:  Dr Shirlee Latch   Reason for Consultation: Heart Failure   HPI:   Mark Roberson. 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 He was seen by a cardiologist up in [place], but moved earlier this year to [facility]. He had PCI back in the 1990s, no cath since the '90s. He had a successful TEE-guided DCCV for atrial fibrillation in [place] in 2/16. Later in 2/16, he moved to [facility]. He was admitted to [facility] with acute on chronic diastolic CHF and found to be back in atrial fibrillation.  He was admitted in 8/16 with acute on chronic diastolic CHF and diuresed well with IV Lasix. HR was low, Coreg was stopped. After this, he ended up being over-diuresed and diuretics were cut back.   Admitted 10/18/15 with A/C CKD. Diuretics adjusted and he was discharged off torsemide for 2 days then restart on 1/30---> torsemide 60 mg in am and 40 mg in pm. He was discharged off metolazone. Discharge weight was 303 pounds.   He was evaluated in HF clinic January 30th and at that time he was stable. He was not on metolazone. He was stable on torsemide 60 mg in am and 40 mg in pm. Weight at that time was 308 pounds. At that time he was followed by [facility].   Over the last few days he been feeling poorly. Weight has been trending down form 308 to 294 pounds. Poor appetite. Denies SOB. Complains if dizziness. Say he was been taking torsemide 60 mg/40 mg + metolazone twice a week (metolazone was stopped last admit 10/20/15). Today he presented the ED with hypotension. Earlier today he was dizzy stand and SBP at home was in the 50s. He is receiving IV fluids. Limited urine output. He denies BRBPR. CXR - no active disease. FOBT +   Pertinent labs on admit include: K 3.9, Creatinine 3.42, BUN 100, Troponin 0.04, Hgb 9.0  WBC 7.1   Most recent ECHO 08/2015 EF 65-70%   Review of Systems: [y] = yes, [ ]  = no   General: Weight gain [ ] ; Weight loss [ ] ; Anorexia [ ] ; Fatigue [Y ]; Fever [ ] ; Chills [ ] ; Weakness [Y ]  Cardiac: Chest pain/pressure [ ] ; Resting SOB [ ] ; Exertional SOB [ ] ; Orthopnea [ ] ; Pedal Edema [ ] ; Palpitations [ ] ; Syncope [ ] ; Presyncope [Y ]; Paroxysmal nocturnal dyspnea[ ]   Pulmonary: Cough [ ] ; Wheezing[ ] ; Hemoptysis[ ] ; Sputum [ ] ; Snoring [ ]   GI: Vomiting[ ] ; Dysphagia[ ] ; Melena[ ] ; Hematochezia [ ] ; Heartburn[ ] ; Abdominal pain [ ] ; Constipation [ ] ; Diarrhea [ ] ; BRBPR [ ]   GU: Hematuria[ ] ; Dysuria [ ] ; Nocturia[ ]   Vascular: Pain in legs with walking [ ] ; Pain in feet with lying flat [ ] ; Non-healing sores [ ] ; Stroke [ ] ; TIA [ ] ; Slurred speech [ ] ;  Neuro: Headaches[ ] ; Vertigo[ ] ; Seizures[ ] ; Paresthesias[ ] ;Blurred vision [ ] ; Diplopia [ ] ; Vision changes [ ]   Ortho/Skin: Arthritis [ ] ; Joint pain [ Y]; Muscle pain [ ] ; Joint swelling [ ] ; Back Pain [ ] ; Rash [ ]   Psych: Depression[ ] ; Anxiety[ ]   Heme: Bleeding problems [ ] ; Clotting disorders [ ] ; Anemia [ ]   Endocrine: Diabetes [Y ]; Thyroid dysfunction[ ]   Home Medications Prior  to Admission medications   Medication Sig Start Date End Date Taking? Authorizing Provider  acetaminophen (TYLENOL) 500 MG tablet Take 500 mg by mouth every 4 (four) hours as needed (pain). Do not exceed 4 gms of tylenol in 24 hours    Historical Provider, MD  albuterol (PROVENTIL) (2.5 MG/3ML) 0.083% nebulizer solution Take 3 mLs (2.5 mg total) by nebulization every 6 (six) hours as needed for wheezing or shortness of breath. 11/14/14   [name], MD  atorvastatin (LIPITOR) 20 MG tablet Take 20 mg by mouth at bedtime.     Historical Provider, MD  B-D ULTRAFINE III SHORT PEN 31G X 8 MM MISC USE AS DIRECTED. THREE TIMES DAILY 11/01/15   [name], MD  cholecalciferol (VITAMIN D) 1000 UNITS tablet Take 1,000 Units by mouth  daily.    Historical Provider, MD  gabapentin (NEURONTIN) 300 MG capsule Take 300 mg by mouth 2 (two) times daily.     Historical Provider, MD  HUMALOG KWIKPEN 100 UNIT/ML KiwkPen Inject 0.3-0.4 mLs (30-40 Units total) into the skin 3 (three) times daily. Inject 3 time daily just before each meal 40-30-40 10/20/15   Elease Etienne, MD  Insulin Glargine (LANTUS SOLOSTAR) 100 UNIT/ML Solostar Pen Inject 40 Units into the skin at bedtime.     Historical Provider, MD  Magnesium 400 MG CAPS Take 400 mg by mouth daily.    Historical Provider, MD  niacin 250 MG tablet Take 250 mg by mouth daily.    Historical Provider, MD  Omega-3 Fatty Acids (FISH OIL) 1000 MG CAPS Take 2,000 mg by mouth daily.    Historical Provider, MD  potassium chloride SA (K-DUR,KLOR-CON) 20 MEQ tablet Take 1 tablet (20 mEq total) by mouth 2 (two) times a week. Mon / Caleen Essex 10/20/15   Elease Etienne, MD  PROAIR HFA 108 (90 BASE) MCG/ACT inhaler INHALE 1 TO 2 PUFFS BY MOUTH FOUR TIMES DAILY AS DIRECTED AS NEEDED FOR WHEEZING AND SHORTNESS OF BREATH 04/06/15   [name], MD  rivaroxaban (XARELTO) 20 MG TABS tablet Take 1 tablet (20 mg total) by mouth daily with supper. 10/24/15   Amy D Clegg, NP  torsemide (DEMADEX) 20 MG tablet Take 2 tablets (40 mg total) by mouth 2 (two) times daily. Do not take on 10/21/15 & 10/22/15. Start taking on 10/23/15. 10/23/15   Elease Etienne, MD    Past Medical History: Past Medical History  Diagnosis Date  . CHF (congestive heart failure) (HCC)   . Atrial fibrillation (HCC)   . Diabetes mellitus without complication (HCC)   . Hypertension   . Sleep apnea   . Asthma   . CKD (chronic kidney disease), stage III   . Anemia     Past Surgical History: Past Surgical History  Procedure Laterality Date  . 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 Wound debridement Right   . Multiple extractions with alveoloplasty N/A 01/17/2015    Procedure: Extraction of tooth #'s 14,15, 16 with  alveolopalsty;  Surgeon: Charlynne Pander, DDS;  Location: [facility] OR;  Service: Oral Surgery;  Laterality: N/A;  . Cardioversion N/A 04/03/2015    Procedure: CARDIOVERSION;  Surgeon: Laurey Morale, MD;  Location: [facility] ENDOSCOPY;  Service: Cardiovascular;  Laterality: N/A;    Family History: Family History  Problem Relation Age of Onset  . COPD Mother   . Heart disease Father   . Cancer Sister     breast  . Heart  disease Brother   . Diabetes Neg Hx     Social History: Social History   Social History  . Marital Status: Single    Spouse Name: N/A  . Number of Children: N/A  . Years of Education: N/A   Social History Main Topics  . Smoking status: Never Smoker   . Smokeless tobacco: Never Used  . Alcohol Use: Yes     Comment: rare  . Drug Use: No  . Sexual Activity: Not Asked   Other Topics Concern  . None   Social History Narrative    Allergies:  Allergies  Allergen Reactions  . Percocet [Oxycodone-Acetaminophen] Other (See Comments)    hallucination  . Penicillins Hives    Has patient had a PCN reaction causing immediate rash, facial/tongue/throat swelling, SOB or lightheadedness with hypotension: No Has patient had a PCN reaction causing severe rash involving mucus membranes or skin necrosis: No Has patient had a PCN reaction that required hospitalization No Has patient had a PCN reaction occurring within the last 10 years: No If all of the above answers are "NO", then may proceed with Cephalosporin use.    Objective:    Vital Signs:   Temp:  [97.8 F (36.6 C)-98 F (36.7 C)] 97.8 F (36.6 C) (02/20 1328) Pulse Rate:  [50-68] 50 (02/20 1415) Resp:  [13-16] 14 (02/20 1415) BP: (63-100)/(34-45) 100/42 mmHg (02/20 1415) SpO2:  [92 %-99 %] 97 % (02/20 1415)    Weight change: There were no vitals filed for this visit.  Intake/Output:  No intake or output data in the 24 hours ending 11/13/15 1447   Physical Exam: General:  Pale appearing. No resp  difficulty. In bed  HEENT: Pale normal Neck: supple. JVP flat . Carotids 2+ bilat; no bruits. No lymphadenopathy or thryomegaly appreciated. Cor: PMI nondisplaced. Irregular rate & rhythm. No rubs, gallops or murmurs. Lungs: clear Abdomen: obese,  soft, nontender, nondistended. No hepatosplenomegaly. No bruits or masses. Good bowel sounds. Extremities: no cyanosis, clubbing, rash, edema Neuro: alert & orientedx3, cranial nerves grossly intact. moves all 4 extremities w/o difficulty. Affect pleasant  Telemetry: A fib 56   Labs: Basic Metabolic Panel:  Recent Labs Lab 11/13/15 1333  NA 136  K 3.9  CL 94*  CO2 26  GLUCOSE 100*  BUN 100*  CREATININE 3.42*  CALCIUM 9.4    Liver Function Tests:  Recent Labs Lab 11/13/15 1333  AST 18  ALT 9*  ALKPHOS 47  BILITOT 0.4  PROT 6.2*  ALBUMIN 3.2*   No results for input(s): LIPASE, AMYLASE in the last 168 hours. No results for input(s): AMMONIA in the last 168 hours.  CBC:  Recent Labs Lab 11/13/15 1333  WBC 7.1  NEUTROABS 5.1  HGB 9.0*  HCT 27.9*  MCV 86.6  PLT 175    Cardiac Enzymes: No results for input(s): CKTOTAL, CKMB, CKMBINDEX, TROPONINI in the last 168 hours.  BNP: BNP (last 3 results)  Recent Labs  05/03/15 0516 05/25/15 1217 09/13/15 1815  BNP 284.3* 214.5* 280.4*    ProBNP (last 3 results) No results for input(s): PROBNP in the last 8760 hours.   CBG: No results for input(s): GLUCAP in the last 168 hours.  Coagulation Studies: No results for input(s): LABPROT, INR in the last 72 hours.  Other results: EKG: Afib 57 bpm   Imaging: Dg Chest 1 View  11/13/2015  CLINICAL DATA:  Hypotension this am EXAM: CHEST  1 VIEW COMPARISON:  10/18/2015 FINDINGS: Lungs are clear. Heart size  and mediastinal contours are within normal limits. Left diaphragmatic leaflet is obscured as before. No effusion.  No pneumothorax. Visualized skeletal structures are unremarkable. IMPRESSION: No acute  cardiopulmonary disease. Electronically Signed   By: Corlis Leak M.D.   On: 11/13/2015 14:21      Medications:     Current Medications:     Infusions: . sodium chloride 1,000 mL (11/13/15 1418)      Assessment/Plan/Discussion:  1. Hypotension: likely due to over diuresis as he was taking extra diuretics. Continue IV fluids. Re-hydrate.  2. AKI/CKD - Suspect as above with over diuresis. Creatinine baseline 1.6-1.8. Creatinine on admit  3.42. No NSAIDS use. Continue IV fluids. Repeat BMET in am.  3. Chronic Diastolic HF : EF 29-56% on last echo with RV enlargement. 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   Volume status low. Hold diuretics. Agree with IV fluids. 4. Pulmonary hypertension: Elevated PA pressure on last echo. 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 Uses oxygen as needed at home. 5 Atrial fibrillation: This has been persistent, probably since 2/16. He has remained in atrial fibrillation despite DCCV attempt 7/16 on amiodarone. Off Amiodarone and Coreg due to bradycardia. He is brady cardic today.  FOBT +. Was on Xarelto 20 mg daily prior to admit but with worsening renal function will need to cut back Xarelto to 15 mg daily. May need to stop with FOBT. Add PPI.  6. OSA/suspected OHS: Continue oxygen continuously. He will need to start on CPAP given severe OSA but has not followed up with pulmonary.  7. CAD: Stable, no chest pain. Cut back xarelto.  CAD so not on aspirin. Continue statin this year.  8. DM: Hgb A1C 08/2015 6.5 . He is on insulin 9. Anemia: FOBT + Hgb 9.0 which down from 10.2 about a month ago.   Thank you for the consult we will follow with you.  Length of Stay:   Tonye Becket NP-C   11/13/2015, 2:47 PM  Advanced Heart Failure Team Pager [phone] (M-F; 7a - 4p)  Please contact [facility] for night-coverage after hours (4p -7a ) and weekends on [email or web address]  Patient seen with NP, agree with the above note.    Mark Roberson has been taking torsemide as well as metolazone twice a week.  He was supposed to be off the metolazone but has continued to take it.  I suspect this has led to dehydration with AKI.  - Stop diuretics, gentle hydration.   - Will need to go back on diuretics eventually, but would just use torsemide with no metolazone.   He remains in chronic atrial fibrillation.  FOBT+.   Hemoglobin lower.  Should have GI evaluation, as ideally will continue Xarelto long-term.  Dose will need to be adjusted for renal dysfunction.   OHS/OSA, needs to start CPAP.    Marca Ancona 11/13/2015 4:29 PM

## 2015-11-14 DIAGNOSIS — I9589 Other hypotension: Secondary | ICD-10-CM | POA: Diagnosis not present

## 2015-11-14 DIAGNOSIS — G4733 Obstructive sleep apnea (adult) (pediatric): Secondary | ICD-10-CM | POA: Diagnosis present

## 2015-11-14 DIAGNOSIS — I5023 Acute on chronic systolic (congestive) heart failure: Secondary | ICD-10-CM | POA: Diagnosis not present

## 2015-11-14 DIAGNOSIS — I952 Hypotension due to drugs: Secondary | ICD-10-CM | POA: Diagnosis present

## 2015-11-14 DIAGNOSIS — Z794 Long term (current) use of insulin: Secondary | ICD-10-CM | POA: Diagnosis not present

## 2015-11-14 DIAGNOSIS — I951 Orthostatic hypotension: Secondary | ICD-10-CM | POA: Diagnosis not present

## 2015-11-14 DIAGNOSIS — N189 Chronic kidney disease, unspecified: Secondary | ICD-10-CM | POA: Diagnosis not present

## 2015-11-14 DIAGNOSIS — I130 Hypertensive heart and chronic kidney disease with heart failure and stage 1 through stage 4 chronic kidney disease, or unspecified chronic kidney disease: Secondary | ICD-10-CM | POA: Diagnosis present

## 2015-11-14 DIAGNOSIS — I482 Chronic atrial fibrillation: Secondary | ICD-10-CM | POA: Diagnosis present

## 2015-11-14 DIAGNOSIS — I5032 Chronic diastolic (congestive) heart failure: Secondary | ICD-10-CM | POA: Diagnosis present

## 2015-11-14 DIAGNOSIS — R5381 Other malaise: Secondary | ICD-10-CM | POA: Diagnosis not present

## 2015-11-14 DIAGNOSIS — E039 Hypothyroidism, unspecified: Secondary | ICD-10-CM | POA: Diagnosis present

## 2015-11-14 DIAGNOSIS — J9611 Chronic respiratory failure with hypoxia: Secondary | ICD-10-CM | POA: Diagnosis present

## 2015-11-14 DIAGNOSIS — Z9861 Coronary angioplasty status: Secondary | ICD-10-CM | POA: Diagnosis not present

## 2015-11-14 DIAGNOSIS — N179 Acute kidney failure, unspecified: Secondary | ICD-10-CM | POA: Diagnosis present

## 2015-11-14 DIAGNOSIS — K219 Gastro-esophageal reflux disease without esophagitis: Secondary | ICD-10-CM | POA: Diagnosis present

## 2015-11-14 DIAGNOSIS — E1142 Type 2 diabetes mellitus with diabetic polyneuropathy: Secondary | ICD-10-CM | POA: Diagnosis present

## 2015-11-14 DIAGNOSIS — E871 Hypo-osmolality and hyponatremia: Secondary | ICD-10-CM | POA: Diagnosis not present

## 2015-11-14 DIAGNOSIS — E1122 Type 2 diabetes mellitus with diabetic chronic kidney disease: Secondary | ICD-10-CM | POA: Diagnosis present

## 2015-11-14 DIAGNOSIS — E860 Dehydration: Secondary | ICD-10-CM | POA: Diagnosis present

## 2015-11-14 DIAGNOSIS — N183 Chronic kidney disease, stage 3 (moderate): Secondary | ICD-10-CM | POA: Diagnosis present

## 2015-11-14 DIAGNOSIS — E785 Hyperlipidemia, unspecified: Secondary | ICD-10-CM | POA: Diagnosis present

## 2015-11-14 DIAGNOSIS — Z96652 Presence of left artificial knee joint: Secondary | ICD-10-CM | POA: Diagnosis present

## 2015-11-14 DIAGNOSIS — T502X1A Poisoning by carbonic-anhydrase inhibitors, benzothiadiazides and other diuretics, accidental (unintentional), initial encounter: Secondary | ICD-10-CM | POA: Diagnosis present

## 2015-11-14 DIAGNOSIS — Z79899 Other long term (current) drug therapy: Secondary | ICD-10-CM | POA: Diagnosis not present

## 2015-11-14 DIAGNOSIS — Z7901 Long term (current) use of anticoagulants: Secondary | ICD-10-CM | POA: Diagnosis not present

## 2015-11-14 DIAGNOSIS — D62 Acute posthemorrhagic anemia: Secondary | ICD-10-CM | POA: Diagnosis not present

## 2015-11-14 DIAGNOSIS — J45909 Unspecified asthma, uncomplicated: Secondary | ICD-10-CM | POA: Diagnosis present

## 2015-11-14 DIAGNOSIS — Z6841 Body Mass Index (BMI) 40.0 and over, adult: Secondary | ICD-10-CM | POA: Diagnosis not present

## 2015-11-14 DIAGNOSIS — I950 Idiopathic hypotension: Secondary | ICD-10-CM | POA: Diagnosis not present

## 2015-11-14 DIAGNOSIS — E872 Acidosis: Secondary | ICD-10-CM | POA: Diagnosis present

## 2015-11-14 DIAGNOSIS — R531 Weakness: Secondary | ICD-10-CM | POA: Diagnosis present

## 2015-11-14 DIAGNOSIS — E038 Other specified hypothyroidism: Secondary | ICD-10-CM | POA: Diagnosis not present

## 2015-11-14 DIAGNOSIS — I272 Other secondary pulmonary hypertension: Secondary | ICD-10-CM | POA: Diagnosis present

## 2015-11-14 DIAGNOSIS — Z66 Do not resuscitate: Secondary | ICD-10-CM | POA: Diagnosis present

## 2015-11-14 DIAGNOSIS — I2510 Atherosclerotic heart disease of native coronary artery without angina pectoris: Secondary | ICD-10-CM | POA: Diagnosis present

## 2015-11-14 DIAGNOSIS — D638 Anemia in other chronic diseases classified elsewhere: Secondary | ICD-10-CM | POA: Diagnosis present

## 2015-11-14 LAB — BASIC METABOLIC PANEL
Anion gap: 13 (ref 5–15)
BUN: 93 mg/dL — ABNORMAL HIGH (ref 6–20)
CALCIUM: 8.8 mg/dL — AB (ref 8.9–10.3)
CO2: 28 mmol/L (ref 22–32)
CREATININE: 2.77 mg/dL — AB (ref 0.61–1.24)
Chloride: 94 mmol/L — ABNORMAL LOW (ref 101–111)
GFR, EST AFRICAN AMERICAN: 25 mL/min — AB (ref >60)
GFR, EST NON AFRICAN AMERICAN: 22 mL/min — AB (ref >60)
Glucose, Bld: 181 mg/dL — ABNORMAL HIGH (ref 65–99)
Potassium: 4.3 mmol/L (ref 3.5–5.1)
SODIUM: 135 mmol/L (ref 135–145)

## 2015-11-14 LAB — GLUCOSE, CAPILLARY
Glucose-Capillary: 151 mg/dL — ABNORMAL HIGH (ref 65–99)
Glucose-Capillary: 266 mg/dL — ABNORMAL HIGH (ref 65–99)
Glucose-Capillary: 284 mg/dL — ABNORMAL HIGH (ref 65–99)
Glucose-Capillary: 325 mg/dL — ABNORMAL HIGH (ref 65–99)

## 2015-11-14 LAB — CBC
HCT: 29.9 % — ABNORMAL LOW (ref 39.0–52.0)
HEMOGLOBIN: 9.3 g/dL — AB (ref 13.0–17.0)
MCH: 26.9 pg (ref 26.0–34.0)
MCHC: 31.1 g/dL (ref 30.0–36.0)
MCV: 86.4 fL (ref 78.0–100.0)
Platelets: 179 10*3/uL (ref 150–400)
RBC: 3.46 MIL/uL — AB (ref 4.22–5.81)
RDW: 16.5 % — ABNORMAL HIGH (ref 11.5–15.5)
WBC: 6.3 10*3/uL (ref 4.0–10.5)

## 2015-11-14 LAB — IRON AND TIBC
IRON: 38 ug/dL — AB (ref 45–182)
SATURATION RATIOS: 10 % — AB (ref 17.9–39.5)
TIBC: 392 ug/dL (ref 250–450)
UIBC: 354 ug/dL

## 2015-11-14 LAB — RETICULOCYTES
RBC.: 3.46 MIL/uL — AB (ref 4.22–5.81)
Retic Count, Absolute: 45 10*3/uL (ref 19.0–186.0)
Retic Ct Pct: 1.3 % (ref 0.4–3.1)

## 2015-11-14 LAB — FERRITIN: Ferritin: 26 ng/mL (ref 24–336)

## 2015-11-14 LAB — VITAMIN B12: VITAMIN B 12: 420 pg/mL (ref 180–914)

## 2015-11-14 LAB — FOLATE: FOLATE: 14.6 ng/mL (ref >5.9)

## 2015-11-14 MED ORDER — SODIUM CHLORIDE 0.9 % IV SOLN
INTRAVENOUS | Status: DC
  Administered 2015-11-14: 10:00:00 via INTRAVENOUS

## 2015-11-14 MED ORDER — PANTOPRAZOLE SODIUM 40 MG PO TBEC
40.0000 mg | DELAYED_RELEASE_TABLET | Freq: Every day | ORAL | Status: DC
  Administered 2015-11-14 – 2015-11-16 (×3): 40 mg via ORAL
  Filled 2015-11-14 (×3): qty 1

## 2015-11-14 MED ORDER — INSULIN ASPART 100 UNIT/ML ~~LOC~~ SOLN
5.0000 [IU] | Freq: Once | SUBCUTANEOUS | Status: AC
Start: 2015-11-14 — End: 2015-11-14
  Administered 2015-11-14: 22:00:00 5 [IU] via SUBCUTANEOUS

## 2015-11-14 NOTE — Progress Notes (Signed)
Advanced Heart Failure Rounding Note   Subjective:    Admitted with hypotension and worsening renal function. Receiving NS IV fluids. Diuretics held. Creatinine trending down 3.4>2.7. Weight up a pounds.   Denies SOB/dizziness.    Objective:   Weight Range:  Vital Signs:   Temp:  [97 F (36.1 C)-98 F (36.7 C)] 97 F (36.1 C) (02/21 0736) Pulse Rate:  [50-68] 51 (02/21 0736) Resp:  [12-28] 28 (02/21 0736) BP: (63-119)/(32-60) 102/60 mmHg (02/21 0736) SpO2:  [92 %-100 %] 100 % (02/21 0736) Weight:  [298 lb 15.1 oz (135.6 kg)-299 lb 2.6 oz (135.7 kg)] 299 lb 2.6 oz (135.7 kg) (02/21 0736) Last BM Date: 11/14/15  Weight change: Filed Weights   11/13/15 2007 11/14/15 0217 11/14/15 0736  Weight: 298 lb 15.1 oz (135.6 kg) 298 lb 15.1 oz (135.6 kg) 299 lb 2.6 oz (135.7 kg)    Intake/Output:   Intake/Output Summary (Last 24 hours) at 11/14/15 0955 Last data filed at 11/14/15 0800  Gross per 24 hour  Intake   2860 ml  Output   1750 ml  Net   1110 ml     Physical Exam: General: Pale appearing. No resp difficulty. In bed  HEENT: Pale normal Neck: supple. JVP 6-7 . Carotids 2+ bilat; no bruits. No lymphadenopathy or thryomegaly appreciated. Cor: PMI nondisplaced. Irregular rate & rhythm. No rubs, gallops or murmurs. Lungs: clear Abdomen: obese, soft, nontender, nondistended. No hepatosplenomegaly. No bruits or masses. Good bowel sounds. Extremities: no cyanosis, clubbing, rash, edema Neuro: alert & orientedx3, cranial nerves grossly intact. moves all 4 extremities w/o difficulty. Affect pleasant  Telemetry: A fib 56   Labs: Basic Metabolic Panel:  Recent Labs Lab 11/13/15 1333 11/14/15 0457  NA 136 135  K 3.9 4.3  CL 94* 94*  CO2 26 28  GLUCOSE 100* 181*  BUN 100* 93*  CREATININE 3.42* 2.77*  CALCIUM 9.4 8.8*    Liver Function Tests:  Recent Labs Lab 11/13/15 1333  AST 18  ALT 9*  ALKPHOS 47  BILITOT 0.4  PROT 6.2*  ALBUMIN 3.2*   No  results for input(s): LIPASE, AMYLASE in the last 168 hours. No results for input(s): AMMONIA in the last 168 hours.  CBC:  Recent Labs Lab 11/13/15 1333  WBC 7.1  NEUTROABS 5.1  HGB 9.0*  HCT 27.9*  MCV 86.6  PLT 175    Cardiac Enzymes: No results for input(s): CKTOTAL, CKMB, CKMBINDEX, TROPONINI in the last 168 hours.  BNP: BNP (last 3 results)  Recent Labs  05/03/15 0516 05/25/15 1217 09/13/15 1815  BNP 284.3* 214.5* 280.4*    ProBNP (last 3 results) No results for input(s): PROBNP in the last 8760 hours.    Other results:  Imaging: Dg Chest 1 View  11/13/2015  CLINICAL DATA:  Hypotension this am EXAM: CHEST  1 VIEW COMPARISON:  10/18/2015 FINDINGS: Lungs are clear. Heart size and mediastinal contours are within normal limits. Left diaphragmatic leaflet is obscured as before. No effusion.  No pneumothorax. Visualized skeletal structures are unremarkable. IMPRESSION: No acute cardiopulmonary disease. Electronically Signed   By: Corlis Leak M.D.   On: 11/13/2015 14:21      Medications:     Scheduled Medications: . atorvastatin  20 mg Oral QHS  . gabapentin  300 mg Oral BID  . insulin aspart  0-9 Units Subcutaneous TID WC  . insulin glargine  30 Units Subcutaneous QHS  . rivaroxaban  15 mg Oral Daily  Infusions: . sodium chloride 50 mL/hr at 11/14/15 0800     PRN Medications:  albuterol, ondansetron **OR** ondansetron (ZOFRAN) IV   Assessment/Plan/Discussion:   1. Hypotension: likely due to over diuresis as he was taking extra diuretics.He is being rehydrated with IV fluids. BP improved today.  2. AKI/CKD - Suspect as above with over diuresis. Creatinine baseline 1.6-1.8. Creatinine on admit 3.42 but today down to 2.7. No NSAIDS use. Continue IV fluids. Repeat BMET in am.  3. Chronic Diastolic HF : EF 95-62% on last echo with RV enlargement. 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 Volume status low. Hold diuretics. Agree with IV fluids. Will need to restart torsemide in the next day. No metolazone.  4. Pulmonary hypertension: Elevated PA pressure on last echo. 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 Uses oxygen. 5 Atrial fibrillation: This has been persistent, probably since 2/16. He has remained in atrial fibrillation despite DCCV attempt 7/16 on amiodarone. Off Amiodarone and Coreg due to bradycardia. He is bradycardic today.  FOBT +. On lower dose of xarelto with lower GFR. Continue  back Xarelto to 15 mg daily. Check CBC.  Add PPI.  6. OSA/suspected OHS: Continue oxygen continuously. He will need to start on CPAP given severe OSA but has not followed up with pulmonary.  7. CAD: Stable, no chest pain. Cut back xarelto. CAD so not on aspirin. Continue statin.  8. DM: Hgb A1C 08/2015 6.5 . He is on insulin 9. Anemia: FOBT + Hgb 9.0 which down from 10.2 about a month ago. Check CBC.    Consult cardiac rehab.   Length of Stay: 1   Amy Clegg NP-C  11/14/2015, 9:55 AM  Advanced Heart Failure Team Pager [phone] (M-F; 7a - 4p)  Please contact [facility] for night-coverage after hours (4p -7a ) and weekends on [email or web address]  Agree with the above note.   Marca Ancona 11/14/2015

## 2015-11-14 NOTE — Care Management Obs Status (Signed)
MEDICARE OBSERVATION STATUS NOTIFICATION   Patient Details  Name: Mark Roberson MRN: [id number] Date of Birth: [birth year]   Medicare Observation Status Notification Given:  Yes    Leone Haven, RN 11/14/2015, 11:23 AM

## 2015-11-14 NOTE — Care Management Note (Addendum)
Case Management Note  Patient Details  Name: Mark Roberson MRN: [id number] Date of Birth: 13-Feb-1946  Subjective/Objective:        Patient is from home, pta indep.  Patient states he has [facility] services with an Agency but could not remember the agency's name.  Per note back in Jan 2017 states patient is with [facility].  NCM called Abbey with [facility] to confirm.  Awaiting call back.  Patient was already established on xarelto previously.   Abbey with [facility] called and stated yes patient is active with them for [facility] for CHF Disease Management, they will resume his HHRN.     Await pt eval.     Action/Plan:   Expected Discharge Date:                  Expected Discharge Plan:  Home w Home Health Services  In-House Referral:     Discharge planning Services  CM Consult  Post Acute Care Choice:  Home Health, Resumption of Svcs/PTA Provider Choice offered to:  Patient  DME Arranged:    DME Agency:     HH Arranged:    HH Agency:     Status of Service:  In process, will continue to follow  Medicare Important Message Given:    Date Medicare IM Given:    Medicare IM give by:    Date Additional Medicare IM Given:    Additional Medicare Important Message give by:     If discussed at Long Length of Stay Meetings, dates discussed:    Additional Comments:  Leone Haven, RN 11/14/2015, 2:36 PM

## 2015-11-14 NOTE — Progress Notes (Signed)
Patient Demographics:    Mark Roberson, is a 70 y.o. male, DOB - [birth year], ZOX:[id number]  Admit date - 11/13/2015   Admitting Physician Ozella Rocks, MD  Outpatient Primary MD for the patient is [name], MD  LOS - 1   Chief Complaint  Patient presents with  . Hypotension        Subjective:    Mark Roberson today has, No headache, No chest pain, No abdominal pain - No Nausea, No new weakness tingling or numbness, No Cough - SOB.     Assessment  & Plan :     1. 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 Baseline creatinine appears to be close to 2.5, diuretics held, hold ACE inhibitor and other nephrotoxins, improving with hydration and continue for 24 hours, if cleared by cardiology discharge in the morning.  2. Chronic diastolic CHF EF around 60%. Currently dehydrated. Hydrate.  3. Dyslipidemia. On statin continue.  4. Diabetic peripheral neuropathy. Continue Neurontin. Dose reduced due to renal failure.  5. GERD. On PPI continue.  6. Chronic atrial fibrillation. [place] vasc 2 score for least 3. Continue xaralto. Goal is rate controlled.  7. Morbid obesity with obstructive sleep apnea. Outpatient follow-up with PCP for weight reduction, CPAP at night.  8. Anemia of chronic disease. Is on xaralto, will check anemia panel. We'll recommend outpatient age-appropriate anemia workup. Continue PPI. No signs of overt GI bleed.  9. Diabetes mellitus type 2. On Lantus and sliding scale monitor.  CBG (last 3)   Recent Labs  11/13/15 1731 11/13/15 1854 11/14/15 0626  GLUCAP 106* 169* 151*     Lab Results  Component Value Date   HGBA1C 6.5* 09/15/2015     Code Status : DO NOT RESUSCITATE  Family Communication  :  None  Disposition Plan  : Home in a.m.  Consults  : Cards  Procedures  :   DVT Prophylaxis  :  Xaralto  Lab Results  Component Value Date   PLT 175 11/13/2015    Inpatient Medications  Scheduled Meds: . atorvastatin  20 mg Oral QHS  . gabapentin  300 mg Oral BID  . insulin aspart  0-9 Units Subcutaneous TID WC  . insulin glargine  30 Units Subcutaneous QHS  . pantoprazole  40 mg Oral Daily  . rivaroxaban  15 mg Oral Daily   Continuous Infusions: . sodium chloride 50 mL/hr at 11/14/15 0957   PRN Meds:.albuterol, ondansetron **OR** ondansetron (ZOFRAN) IV  Antibiotics  :    Anti-infectives    None        Objective:   Filed Vitals:   11/13/15 2007 11/14/15 0217 11/14/15 0736 11/14/15 1130  BP: 119/32 105/49 102/60 100/31  Pulse: 63 56 51 56  Temp: 97.9 F (36.6 C) 97.6 F (36.4 C) 97 F (36.1 C) 98 F (36.7 C)  TempSrc: Oral Oral Oral Oral  Resp: Height: 6' (1.829 m)     Weight: 135.6 kg (298 lb 15.1 oz) 135.6 kg (298 lb 15.1 oz) 135.7 kg (299 lb 2.6 oz)   SpO2: 99% 100% 100% 97%    Wt Readings from Last 3 Encounters:  11/14/15 135.7 kg (299  lb 2.6 oz)  10/24/15 139.708 kg (308 lb)  10/21/15 137.8 kg (303 lb 12.7 oz)     Intake/Output Summary (Last 24 hours) at 11/14/15 1138 Last data filed at 11/14/15 0800  Gross per 24 hour  Intake   2860 ml  Output   1750 ml  Net   1110 ml     Physical Exam  Awake Alert, Oriented X 3, No new F.N deficits, Normal affect [place].AT,PERRAL Supple Neck,No JVD, No cervical lymphadenopathy appriciated.  Symmetrical Chest wall movement, Good air movement bilaterally, CTAB RRR,No Gallops,Rubs or new Murmurs, No Parasternal Heave +ve B.Sounds, Abd Soft, No tenderness, No organomegaly appriciated, No rebound - guarding or rigidity. No Cyanosis, Clubbing or edema, No new Rash or bruise       Data Review:   Micro Results No results found for this or any previous visit (from the past 240  hour(s)).  Radiology Reports Dg Chest 1 View  11/13/2015  CLINICAL DATA:  Hypotension this am EXAM: CHEST  1 VIEW COMPARISON:  10/18/2015 FINDINGS: Lungs are clear. Heart size and mediastinal contours are within normal limits. Left diaphragmatic leaflet is obscured as before. No effusion.  No pneumothorax. Visualized skeletal structures are unremarkable. IMPRESSION: No acute cardiopulmonary disease. Electronically Signed   By: Corlis Leak M.D.   On: 11/13/2015 14:21   Dg Chest 2 View  10/18/2015  CLINICAL DATA:  Fatigue for the past few days.  Initial encounter. EXAM: CHEST  2 VIEW COMPARISON:  PA and lateral chest 09/13/2015 and 05/02/2015. FINDINGS: The lungs are clear. Heart size is upper normal. No pneumothorax or pleural effusion. IMPRESSION: No acute disease. Electronically Signed   By: Drusilla Kanner M.D.   On: 10/18/2015 20:24     CBC  Recent Labs Lab 11/13/15 1333  WBC 7.1  HGB 9.0*  HCT 27.9*  PLT 175  MCV 86.6  MCH 28.0  MCHC 32.3  RDW 16.4*  LYMPHSABS 0.8  MONOABS 1.1*  EOSABS 0.1  BASOSABS 0.0    Chemistries   Recent Labs Lab 11/13/15 1333 11/14/15 0457  NA 136 135  K 3.9 4.3  CL 94* 94*  CO2 26 28  GLUCOSE 100* 181*  BUN 100* 93*  CREATININE 3.42* 2.77*  CALCIUM 9.4 8.8*  AST 18  --   ALT 9*  --   ALKPHOS 47  --   BILITOT 0.4  --    ------------------------------------------------------------------------------------------------------------------ No results for input(s): CHOL, HDL, LDLCALC, TRIG, CHOLHDL, LDLDIRECT in the last 72 hours.  Lab Results  Component Value Date   HGBA1C 6.5* 09/15/2015   ------------------------------------------------------------------------------------------------------------------ No results for input(s): TSH, T4TOTAL, T3FREE, THYROIDAB in the last 72 hours.  Invalid input(s): FREET3 ------------------------------------------------------------------------------------------------------------------ No results for  input(s): VITAMINB12, FOLATE, FERRITIN, TIBC, IRON, RETICCTPCT in the last 72 hours.  Coagulation profile No results for input(s): INR, PROTIME in the last 168 hours.  No results for input(s): DDIMER in the last 72 hours.  Cardiac Enzymes No results for input(s): CKMB, TROPONINI, MYOGLOBIN in the last 168 hours.  Invalid input(s): CK ------------------------------------------------------------------------------------------------------------------    Component Value Date/Time   BNP 280.4* 09/13/2015 1815    Time Spent in minutes  35   Mark Roberson K M.D on 11/14/2015 at 11:38 AM  Between 7am to 7pm - Pager - [phone]  After 7pm go to [email or web address] - password [facility]  [facility] -  Office  [phone]

## 2015-11-15 DIAGNOSIS — I950 Idiopathic hypotension: Secondary | ICD-10-CM

## 2015-11-15 DIAGNOSIS — I5023 Acute on chronic systolic (congestive) heart failure: Secondary | ICD-10-CM

## 2015-11-15 LAB — BASIC METABOLIC PANEL
ANION GAP: 12 (ref 5–15)
BUN: 86 mg/dL — ABNORMAL HIGH (ref 6–20)
CALCIUM: 8.5 mg/dL — AB (ref 8.9–10.3)
CO2: 26 mmol/L (ref 22–32)
Chloride: 94 mmol/L — ABNORMAL LOW (ref 101–111)
Creatinine, Ser: 2.57 mg/dL — ABNORMAL HIGH (ref 0.61–1.24)
GFR, EST AFRICAN AMERICAN: 28 mL/min — AB (ref >60)
GFR, EST NON AFRICAN AMERICAN: 24 mL/min — AB (ref >60)
Glucose, Bld: 264 mg/dL — ABNORMAL HIGH (ref 65–99)
POTASSIUM: 4.1 mmol/L (ref 3.5–5.1)
Sodium: 132 mmol/L — ABNORMAL LOW (ref 135–145)

## 2015-11-15 LAB — TSH: TSH: 18.746 u[IU]/mL — AB (ref 0.350–4.500)

## 2015-11-15 LAB — GLUCOSE, CAPILLARY
GLUCOSE-CAPILLARY: 221 mg/dL — AB (ref 65–99)
Glucose-Capillary: 244 mg/dL — ABNORMAL HIGH (ref 65–99)
Glucose-Capillary: 296 mg/dL — ABNORMAL HIGH (ref 65–99)
Glucose-Capillary: 380 mg/dL — ABNORMAL HIGH (ref 65–99)

## 2015-11-15 LAB — CBC
HCT: 26.5 % — ABNORMAL LOW (ref 39.0–52.0)
HEMOGLOBIN: 8.4 g/dL — AB (ref 13.0–17.0)
MCH: 27.5 pg (ref 26.0–34.0)
MCHC: 31.7 g/dL (ref 30.0–36.0)
MCV: 86.9 fL (ref 78.0–100.0)
Platelets: 159 10*3/uL (ref 150–400)
RBC: 3.05 MIL/uL — AB (ref 4.22–5.81)
RDW: 16.7 % — ABNORMAL HIGH (ref 11.5–15.5)
WBC: 5 10*3/uL (ref 4.0–10.5)

## 2015-11-15 LAB — CORTISOL: CORTISOL PLASMA: 8.3 ug/dL

## 2015-11-15 MED ORDER — LEVOTHYROXINE SODIUM 100 MCG IV SOLR
50.0000 ug | Freq: Every day | INTRAVENOUS | Status: DC
  Administered 2015-11-15 – 2015-11-16 (×2): 50 ug via INTRAVENOUS
  Filled 2015-11-15 (×2): qty 5

## 2015-11-15 MED ORDER — LEVOTHYROXINE SODIUM 100 MCG IV SOLR: 25.0000 ug | Freq: Every day | INTRAVENOUS | Status: DC

## 2015-11-15 MED ORDER — FERROUS SULFATE 325 (65 FE) MG PO TABS: 325.0000 mg | ORAL_TABLET | Freq: Every day | ORAL | Status: DC

## 2015-11-15 MED ORDER — TORSEMIDE 20 MG PO TABS
40.0000 mg | ORAL_TABLET | Freq: Two times a day (BID) | ORAL | Status: DC
Start: 2015-11-15 — End: 2015-11-16
  Administered 2015-11-15 – 2015-11-16 (×3): 40 mg via ORAL
  Filled 2015-11-15 (×4): qty 2

## 2015-11-15 MED ORDER — INSULIN ASPART 100 UNIT/ML ~~LOC~~ SOLN
4.0000 [IU] | Freq: Once | SUBCUTANEOUS | Status: AC
  Administered 2015-11-15: 4 [IU] via SUBCUTANEOUS

## 2015-11-15 MED ORDER — SODIUM CHLORIDE 0.9 % IV BOLUS (SEPSIS)
500.0000 mL | Freq: Once | INTRAVENOUS | Status: AC
  Administered 2015-11-15: 09:00:00 500 mL via INTRAVENOUS

## 2015-11-15 NOTE — Clinical Social Work Placement (Signed)
   CLINICAL SOCIAL WORK PLACEMENT  NOTE  Date:  11/15/2015  Patient Details  Name: Mark Roberson MRN: [id number] Date of Birth: 05-30-1946  Clinical Social Work is seeking post-discharge placement for this patient at the Skilled  Nursing Facility level of care (*CSW will initial, date and re-position this form in  chart as items are completed):  Yes   Patient/family provided with [facility] Clinical Social Work Department's list of facilities offering this level of care within the geographic area requested by the patient (or if unable, by the patient's family).  Yes   Patient/family informed of their freedom to choose among providers that offer the needed level of care, that participate in Medicare, Medicaid or managed care program needed by the patient, have an available bed and are willing to accept the patient.  Yes   Patient/family informed of [facility]'s ownership interest in [facility] and [facility], as well as of the fact that they are under no obligation to receive care at these facilities.  PASRR submitted to EDS on       PASRR number received on       Existing PASRR number confirmed on 11/15/15     FL2 transmitted to all facilities in geographic area requested by pt/family on 11/15/15     FL2 transmitted to all facilities within larger geographic area on       Patient informed that his/her managed care company has contracts with or will negotiate with certain facilities, including the following:        Yes   Patient/family informed of bed offers received.  Patient chooses bed at       Physician recommends and patient chooses bed at      Patient to be transferred to   on  .  Patient to be transferred to facility by       Patient family notified on   of transfer.  Name of family member notified:        PHYSICIAN       Additional Comment:    _______________________________________________ Rondel Baton, LCSW 11/15/2015, 3:12 PM

## 2015-11-15 NOTE — Progress Notes (Signed)
Pt's cbg tonight is 380. Pt has been running in the upper 200's. Pt is scheduled for lantus 30 units qhs. Santina Evans notified and order received for 4 units novolog insulin.  *see diabetes coordinator note.*

## 2015-11-15 NOTE — Progress Notes (Signed)
Patient Demographics:    Mark Roberson, is a 70 y.o. male, DOB - [birth year], OZH:[id number]  Admit date - 11/13/2015   Admitting Physician Ozella Rocks, MD  Outpatient Primary MD for the patient is [name], MD  LOS - 2   Chief Complaint  Patient presents with  . Hypotension        Subjective:    Mark Roberson today has, No headache, No chest pain, No abdominal pain - No Nausea, No new weakness tingling or numbness, No Cough - SOB.     Assessment  & Plan :     1. 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 Baseline creatinine appears to be close to 2.5, diuretics held, hold ACE inhibitor and other nephrotoxins, improved with hydration, reviewing chart appears that patient has low baseline blood pressures, random cortisol 8 AM and TSH were ordered, TSH is high at 18, will be started on Synthroid, hypothyroidism undiagnosed could be contribution to his hypotension.  2. Chronic diastolic CHF EF around 60%. Currently dehydrated. Hydrate.  3. Dyslipidemia. On statin continue.  4. Diabetic peripheral neuropathy. Continue Neurontin. Dose reduced due to renal failure.  5. GERD. On PPI continue.  6. Chronic atrial fibrillation. [place] vasc 2 score for least 3. Continue xaralto. Goal is rate control.  7. Morbid obesity with obstructive sleep apnea. Outpatient follow-up with PCP for weight reduction, CPAP at night.  8. Anemia of chronic disease. Is on xaralto, will check anemia panel. We'll recommend outpatient age-appropriate anemia workup. Continue PPI. No signs of overt GI bleed.  9. Newfound hypothyroidism. TSH 18. Start on Synthroid IV, could be contribution to his flat affect, hypotension.   10. Diabetes mellitus type 2. On Lantus and sliding  scale monitor.  CBG (last 3)   Recent Labs  11/14/15 1758 11/14/15 2058 11/15/15 0650  GLUCAP 284* 325* 244*     Lab Results  Component Value Date   HGBA1C 6.5* 09/15/2015     Code Status : DO NOT RESUSCITATE  Family Communication  : None  Disposition Plan  : SNF in a.m.  Consults  : Cards  Procedures  :   DVT Prophylaxis  :  Xaralto  Lab Results  Component Value Date   PLT 159 11/15/2015    Inpatient Medications  Scheduled Meds: . atorvastatin  20 mg Oral QHS  . gabapentin  300 mg Oral BID  . insulin aspart  0-9 Units Subcutaneous TID WC  . insulin glargine  30 Units Subcutaneous QHS  . levothyroxine  50 mcg Intravenous Daily  . pantoprazole  40 mg Oral Daily  . rivaroxaban  15 mg Oral Daily   Continuous Infusions:   PRN Meds:.albuterol, ondansetron **OR** ondansetron (ZOFRAN) IV  Antibiotics  :    Anti-infectives    None        Objective:   Filed Vitals:   11/15/15 0710 11/15/15 0712 11/15/15 0859 11/15/15 0910  BP: 95/33  139/114 112/41  Pulse: 59     Temp: 97.7 F (36.5 C)     TempSrc: Oral     Resp: Height:      Weight:  138.7 kg (305 lb 12.5 oz)  SpO2: 94%       Wt Readings from Last 3 Encounters:  11/15/15 138.7 kg (305 lb 12.5 oz)  10/24/15 139.708 kg (308 lb)  10/21/15 137.8 kg (303 lb 12.7 oz)     Intake/Output Summary (Last 24 hours) at 11/15/15 1107 Last data filed at 11/15/15 0725  Gross per 24 hour  Intake   2000 ml  Output   1300 ml  Net    700 ml     Physical Exam  Awake Alert, Oriented X 3, No new F.N deficits, Flat affect [place].AT,PERRAL Supple Neck,No JVD, No cervical lymphadenopathy appriciated.  Symmetrical Chest wall movement, Good air movement bilaterally, CTAB RRR,No Gallops,Rubs or new Murmurs, No Parasternal Heave +ve B.Sounds, Abd Soft, No tenderness, No organomegaly appriciated, No rebound - guarding or rigidity. No Cyanosis, Clubbing or edema, No new Rash or bruise       Data  Review:   Micro Results No results found for this or any previous visit (from the past 240 hour(s)).  Radiology Reports Dg Chest 1 View  11/13/2015  CLINICAL DATA:  Hypotension this am EXAM: CHEST  1 VIEW COMPARISON:  10/18/2015 FINDINGS: Lungs are clear. Heart size and mediastinal contours are within normal limits. Left diaphragmatic leaflet is obscured as before. No effusion.  No pneumothorax. Visualized skeletal structures are unremarkable. IMPRESSION: No acute cardiopulmonary disease. Electronically Signed   By: Corlis Leak M.D.   On: 11/13/2015 14:21   Dg Chest 2 View  10/18/2015  CLINICAL DATA:  Fatigue for the past few days.  Initial encounter. EXAM: CHEST  2 VIEW COMPARISON:  PA and lateral chest 09/13/2015 and 05/02/2015. FINDINGS: The lungs are clear. Heart size is upper normal. No pneumothorax or pleural effusion. IMPRESSION: No acute disease. Electronically Signed   By: Drusilla Kanner M.D.   On: 10/18/2015 20:24     CBC  Recent Labs Lab 11/13/15 1333 11/14/15 1149 11/15/15 0538  WBC 7.1 6.3 5.0  HGB 9.0* 9.3* 8.4*  HCT 27.9* 29.9* 26.5*  PLT 175 179 159  MCV 86.6 86.4 86.9  MCH 28.0 26.9 27.5  MCHC 32.3 31.1 31.7  RDW 16.4* 16.5* 16.7*  LYMPHSABS 0.8  --   --   MONOABS 1.1*  --   --   EOSABS 0.1  --   --   BASOSABS 0.0  --   --     Chemistries   Recent Labs Lab 11/13/15 1333 11/14/15 0457 11/15/15 0538  NA 136 135 132*  K 3.9 4.3 4.1  CL 94* 94* 94*  CO2 26 28 26   GLUCOSE 100* 181* 264*  BUN 100* 93* 86*  CREATININE 3.42* 2.77* 2.57*  CALCIUM 9.4 8.8* 8.5*  AST 18  --   --   ALT 9*  --   --   ALKPHOS 47  --   --   BILITOT 0.4  --   --    ------------------------------------------------------------------------------------------------------------------ No results for input(s): CHOL, HDL, LDLCALC, TRIG, CHOLHDL, LDLDIRECT in the last 72 hours.  Lab Results  Component Value Date   HGBA1C 6.5* 09/15/2015    ------------------------------------------------------------------------------------------------------------------  Recent Labs  11/15/15 0905  TSH 18.746*   ------------------------------------------------------------------------------------------------------------------  Recent Labs  11/14/15 1149  VITAMINB12 420  FOLATE 14.6  FERRITIN 26  TIBC 392  IRON 38*  RETICCTPCT 1.3    Coagulation profile No results for input(s): INR, PROTIME in the last 168 hours.  No results for input(s): DDIMER in the last 72 hours.  Cardiac Enzymes No results for  input(s): CKMB, TROPONINI, MYOGLOBIN in the last 168 hours.  Invalid input(s): CK ------------------------------------------------------------------------------------------------------------------    Component Value Date/Time   BNP 280.4* 09/13/2015 1815    Time Spent in minutes  35   SINGH,PRASHANT K M.D on 11/15/2015 at 11:07 AM  Between 7am to 7pm - Pager - [phone]  After 7pm go to [email or web address] - password [facility]  [facility] -  Office  [phone]

## 2015-11-15 NOTE — Progress Notes (Signed)
Inpatient Diabetes Program Recommendations  AACE/ADA: New Consensus Statement on Inpatient Glycemic Control (2015)  Target Ranges:  Prepandial:   less than 140 mg/dL      Peak postprandial:   less than 180 mg/dL (1-2 hours)      Critically ill patients:  140 - 180 mg/dL  Results for Mark Roberson, Mark Roberson (MRN [id number]) as of 11/15/2015 12:13  Ref. Range 11/14/2015 06:26 11/14/2015 11:45 11/14/2015 17:58 11/14/2015 20:58 11/15/2015 06:50  Glucose-Capillary Latest Ref Range: 65-99 mg/dL 409 (H) 811 (H) 914 (H) 325 (H) 244 (H)   Review of Glycemic Control  Diabetes history: DM2 Outpatient Diabetes medications: Lantus 45 units QHS, Humalog 40 units with breakfast, Humalog 30 units with lunch, Humalog 40 units with supper Current orders for Inpatient glycemic control: Lantus 30 units QHS, Novolog 0-9 units TID with meals  Inpatient Diabetes Program Recommendations: Insulin - Basal: Please consider increasing Lantus to 35 units QHS. Insulin - Meal Coverage: Patient takes Humalog meal coverage as an outpatient and post prandial glucose has been consistently elevated. Please consider ordering Novolog 8 units TID with meals for meal coverage (in addition to Novolog correction scale).  Thanks, Orlando Penner, RN, MSN, CDE Diabetes Coordinator Inpatient Diabetes Program [phone] (Team Pager from 8am to 5pm) [phone] ([facility] office) [phone] [facility] office) [phone] [facility] office)

## 2015-11-15 NOTE — Evaluation (Signed)
Physical Therapy Evaluation Patient Details Name: Mark Roberson MRN: [id number] DOB: 10/17/1945 Today's Date: 11/15/2015   History of Present Illness  Mr Cassis. 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  Clinical Impression  Pt admitted with above diagnosis. Pt currently with functional limitations due to the deficits listed below (see PT Problem List). Demonstrates some difficulty with transfer but did not require physical assistance. Overall displaying generalized weakness. Only willing to ambulate 15 feet in room, using a rolling walker for assistance. Not sure if pt provided his best effort as he was unwilling to ambulate further. No overt loss of balance or buckling noted with short distance. Pt reports LEs feel "wobbly," reporting minimal lightheadedness which does not resolve significantly with sitting. BP 139/114 immediatly upon sitting although cuff in room does not appear appropriate size. RN notified. Pt will benefit from skilled PT to increase their independence and safety with mobility to allow discharge to the venue listed below.       Follow Up Recommendations SNF - Due to limited ambulatory distance. If pt does not qualify, would consider HHPT    Equipment Recommendations  None recommended by PT    Recommendations for Other Services       Precautions / Restrictions Restrictions Weight Bearing Restrictions: No      Mobility  Bed Mobility Overal bed mobility: Independent                Transfers Overall transfer level: Needs assistance Equipment used: Rolling walker (2 wheeled) Transfers: Sit to/from Stand Sit to Stand: Min guard         General transfer comment: Required several attempts from reclining chair with VC for hand placement and anterior weight shift. Successfully performed x2.  Ambulation/Gait Ambulation/Gait assistance:  Supervision Ambulation Distance (Feet): 15 Feet Assistive device: Rolling walker (2 wheeled) Gait Pattern/deviations: Step-through pattern;Decreased stride length;Trunk flexed Gait velocity: decreased Gait velocity interpretation: Below normal speed for age/gender General Gait Details: Slow, required VC for upright posture and forward gaze. No physical assist, supervision for safety. No overt loss of balance noted. Pt reports feeling "wobbly in the legs."  Reports minimal lightheadeness. Declines to attempt further ambulation  Stairs            Wheelchair Mobility    Modified Rankin (Stroke Patients Only)       Balance Overall balance assessment: Needs assistance Sitting-balance support: No upper extremity supported;Feet supported Sitting balance-Leahy Scale: Good     Standing balance support: No upper extremity supported Standing balance-Leahy Scale: Fair                               Pertinent Vitals/Pain Pain Assessment: No/denies pain    Home Living Family/patient expects to be discharged to:: Skilled nursing facility Living Arrangements:  (Does not state) Available Help at Discharge: Friend(s);Available 24 hours/day Type of Home: House Home Access: Stairs to enter Entrance Stairs-Rails: Right Entrance Stairs-Number of Steps: 3 Home Layout: One level Home Equipment: Walker - 2 wheels;Bedside commode;Tub bench;Wheelchair - Building surveyor;Other (comment)      Prior Function Level of Independence: Independent with assistive device(s)         Comments: pt reports he's independent with ADLs and uses RW for ambulation     Hand Dominance        Extremity/Trunk Assessment   Upper Extremity Assessment: Defer  to OT evaluation           Lower Extremity Assessment: Generalized weakness (Edematous BIL)         Communication   Communication: No difficulties  Cognition Arousal/Alertness: Awake/alert Behavior During Therapy: WFL  for tasks assessed/performed Overall Cognitive Status: Within Functional Limits for tasks assessed                      General Comments General comments (skin integrity, edema, etc.): Reports "wobbly" LEs upon standing. Minimal lightheadedness. BP 139/114 immediately upon sitting although BP cuff does not appear large enough for his arm. HR 59. Denies significant improvement in symptoms upon sitting.    Exercises        Assessment/Plan    PT Assessment Patient needs continued PT services  PT Diagnosis Difficulty walking;Generalized weakness   PT Problem List Decreased strength;Decreased activity tolerance;Decreased balance;Decreased mobility;Obesity  PT Treatment Interventions DME instruction;Gait training;Functional mobility training;Therapeutic activities;Stair training;Therapeutic exercise;Balance training;Patient/family education   PT Goals (Current goals can be found in the Care Plan section) Acute Rehab PT Goals Patient Stated Goal: Go to SNF PT Goal Formulation: With patient Time For Goal Achievement: 11/29/15 Potential to Achieve Goals: Good    Frequency Min 3X/week   Barriers to discharge        Co-evaluation               End of Session Equipment Utilized During Treatment: Gait belt Activity Tolerance: Patient limited by fatigue Patient left: in bed;with call bell/phone within reach (Lab in room) Nurse Communication: Mobility status         Time: 1610-9604 PT Time Calculation (min) (ACUTE ONLY): 15 min   Charges:   PT Evaluation $PT Eval Low Complexity: 1 Procedure     PT G CodesBerton Mount 11/15/2015, 9:23 AM  Charlsie Merles, PT [phone]

## 2015-11-15 NOTE — Progress Notes (Signed)
Unable to ambulate patient in the hall today due to weakness. Ambulating in room with front wheel walker to bathroom, bed and chair. Noted that patient was stronger at the end of the shift than in the am. Will continue to encourage ambulation in the room with the walker today.

## 2015-11-15 NOTE — [place] FL2 (Signed)
[place] MEDICAID FL2 LEVEL OF CARE SCREENING TOOL     IDENTIFICATION  Patient Name: Mark Roberson Birthdate: 1946/02/20 Sex: male Admission Date (Current Location): 11/13/2015  [facility] and [place] Number:  Producer, television/film/video and Address:  [facility]. [facility], [address], [place], [place] [address]      Provider Number: [id number]  Attending Physician Name and Address:  Leroy Sea, MD  Relative Name and Phone Number:       Current Level of Care: Hospital Recommended Level of Care: Skilled Nursing Facility Prior Approval Number:    Date Approved/Denied:   PASRR Number: [id number]  Discharge Plan: SNF    Current Diagnoses: Patient Active Problem List   Diagnosis Date Noted  . Hypotension 11/13/2015  . Acute-on-chronic kidney injury (HCC) 11/13/2015  . DM (diabetes mellitus) with complications (HCC)   . Renal failure (ARF), acute on chronic (HCC) 10/18/2015  . E. coli UTI 09/18/2015  . Acute blood loss anemia 09/14/2015  . On home oxygen therapy 09/13/2015  . Acute on chronic diastolic CHF (congestive heart failure) (HCC) 09/13/2015  . CHF exacerbation (HCC) 09/13/2015  . Chronic diastolic CHF (congestive heart failure) (HCC) 05/26/2015  . Dyslipidemia 05/22/2015  . Chronic pain 05/22/2015  . Physical deconditioning 05/22/2015  . Pulmonary hypertension (HCC) 05/22/2015  . Hypothyroidism 05/22/2015  . Acute on chronic renal insufficiency (HCC)   . AKI (acute kidney injury) (HCC)   . Normocytic anemia   . CAD- S/P remote PCI [facility]) 05/09/2015  . CKD (chronic kidney disease), stage III   . Acute on chronic diastolic heart failure (HCC)   . Anticoagulant long-term use   . Atrial fibrillation with slow ventricular response (HCC)   . Metabolic alkalosis   . Diarrhea 05/02/2015  . Acute renal failure superimposed on stage 3 chronic kidney disease (HCC)   . OSA (obstructive sleep apnea) 03/08/2015  . Apical abscess 01/15/2015  .  Epistaxis 01/14/2015  . Anemia, iron deficiency 01/14/2015  . Peripheral edema 01/11/2015  . Acute respiratory failure with hypoxia (HCC) 11/08/2014  . Morbid obesity-BMI 51 11/08/2014  . HTN (hypertension) 11/08/2014  . Atrial flutter (HCC) 11/08/2014  . DM (diabetes mellitus), type 2 with renal complications (HCC) 11/08/2014    Orientation RESPIRATION BLADDER Height & Weight     Self, Time, Situation, Place  Normal Continent Weight: (!) 305 lb 12.5 oz (138.7 kg) Height:  6' (182.9 cm)  BEHAVIORAL SYMPTOMS/MOOD NEUROLOGICAL BOWEL NUTRITION STATUS      Continent  (please see dc summary for dietary needs at time of discharge)  AMBULATORY STATUS COMMUNICATION OF NEEDS Skin   Limited Assist Verbally Normal                       Personal Care Assistance Level of Assistance              Functional Limitations Info             SPECIAL CARE FACTORS FREQUENCY  PT (By licensed PT), OT (By licensed OT)     PT Frequency: daily OT Frequency: daily            Contractures Contractures Info: Not present    Additional Factors Info  Code Status, Allergies Code Status Info: DNR Allergies Info: percoset, penicillins           Current Medications (11/15/2015):  This is the current hospital active medication list Current Facility-Administered Medications  Medication Dose Route Frequency Provider Last  Rate Last Dose  . albuterol (PROVENTIL) (2.5 MG/3ML) 0.083% nebulizer solution 2.5 mg  2.5 mg Nebulization Q6H PRN Marcos Eke, PA-C      . atorvastatin (LIPITOR) tablet 20 mg  20 mg Oral QHS Marcos Eke, PA-C   20 mg at 11/14/15 2136  . [START ON 11/16/2015] ferrous sulfate tablet 325 mg  325 mg Oral Q breakfast Laurey Morale, MD      . gabapentin (NEURONTIN) capsule 300 mg  300 mg Oral BID Marcos Eke, PA-C   300 mg at 11/15/15 3244  . insulin aspart (novoLOG) injection 0-9 Units  0-9 Units Subcutaneous TID WC Marcos Eke, PA-C   3 Units at 11/15/15 [phone]  .  insulin glargine (LANTUS) injection 30 Units  30 Units Subcutaneous QHS Marcos Eke, PA-C   30 Units at 11/14/15 2201  . levothyroxine (SYNTHROID, LEVOTHROID) injection 50 mcg  50 mcg Intravenous Daily Leroy Sea, MD   50 mcg at 11/15/15 1400  . ondansetron (ZOFRAN) tablet 4 mg  4 mg Oral Q6H PRN Marcos Eke, PA-C       Or  . ondansetron [facility]) injection 4 mg  4 mg Intravenous Q6H PRN Marcos Eke, PA-C      . pantoprazole (PROTONIX) EC tablet 40 mg  40 mg Oral Daily Amy D Clegg, NP   40 mg at 11/15/15 0910  . Rivaroxaban (XARELTO) tablet 15 mg  15 mg Oral Daily Ozella Rocks, MD   15 mg at 11/15/15 0910  . torsemide (DEMADEX) tablet 40 mg  40 mg Oral BID Laurey Morale, MD         Discharge Medications: Please see discharge summary for a list of discharge medications.  Relevant Imaging Results:  Relevant Lab Results:   Additional Information SSN: [id number]  Rondel Baton, LCSW

## 2015-11-15 NOTE — Care Management Note (Signed)
Case Management Note  Patient Details  Name: Antwane Grose MRN: [id number] Date of Birth: [birth year]  Subjective/Objective:     Per pt eval rec SNF, patient states he would like to go to [facility] the SNF he was in before, NCM informed him will have CSW come to speak to him about this.  NCM informed Almira Coaster , CSW .                Action/Plan:   Expected Discharge Date:                  Expected Discharge Plan:  Skilled Nursing Facility  In-House Referral:  Clinical Social Work  Discharge planning Services  CM Consult  Post Acute Care Choice:  Home Health, Resumption of Svcs/PTA Provider Choice offered to:  Patient  DME Arranged:    DME Agency:     HH Arranged:    HH Agency:     Status of Service:  In process, will continue to follow  Medicare Important Message Given:    Date Medicare IM Given:    Medicare IM give by:    Date Additional Medicare IM Given:    Additional Medicare Important Message give by:     If discussed at Long Length of Stay Meetings, dates discussed:    Additional Comments:  Leone Haven, RN 11/15/2015, 10:55 AM

## 2015-11-15 NOTE — Progress Notes (Signed)
Advanced Heart Failure Rounding Note   Subjective:    Admitted with hypotension and worsening renal function. Receiving NS IV fluids. Diuretics held.   Still getting slightly lightheaded, though feeling better. Wants to try and walk with PT later today.  Would like to go to SNF on d/c if possible. He denies active bleeding or dark stools.   Creatinine trending down 3.4>2.7>2.5. Baseline presumed to be 1.8-2.0, but may be gradually worsening with his co-morbidities. BUN remains high at 86. Positive 4.7 L and up 7 lbs from admit.     Objective:   Weight Range:  Vital Signs:   Temp:  [97 F (36.1 C)-98 F (36.7 C)] 97.3 F (36.3 C) (02/22 0401) Pulse Rate:  [52-56] 52 (02/22 0401) Resp:  [13-19] 16 (02/22 0401) BP: (81-104)/(23-38) 104/38 mmHg (02/22 0406) SpO2:  [94 %-98 %] 94 % (02/22 0401) Weight:  [305 lb 12.5 oz (138.7 kg)-308 lb 3.3 oz (139.8 kg)] 305 lb 12.5 oz (138.7 kg) (02/22 0712) Last BM Date: 11/14/15  Weight change: Filed Weights   11/14/15 0736 11/15/15 0401 11/15/15 0712  Weight: 299 lb 2.6 oz (135.7 kg) 308 lb 3.3 oz (139.8 kg) 305 lb 12.5 oz (138.7 kg)    Intake/Output:   Intake/Output Summary (Last 24 hours) at 11/15/15 0815 Last data filed at 11/15/15 0600  Gross per 24 hour  Intake   1760 ml  Output   1300 ml  Net    460 ml     Physical Exam: General: Pale appearing. No resp difficulty. In bed  HEENT: Pale normal Neck: supple. JVP 8-9 cm. Carotids 2+ bilat; no bruits. No thyromegaly or nodule noted Cor: PMI nondisplaced. Irregular rate & rhythm. No rubs, gallops or murmurs. Lungs: CTAB, normal effort Abdomen: obese,soft, NT, ND, no HSM. No bruits or masses. +BS  Extremities: no cyanosis, clubbing, rash, edema Neuro: alert & orientedx3, cranial nerves grossly intact. moves all 4 extremities w/o difficulty. Affect pleasant  Telemetry: Reviewed personally, afib 50s   Labs: Basic Metabolic Panel:  Recent Labs Lab 11/13/15 1333  11/14/15 0457 11/15/15 0538  NA 136 135 132*  K 3.9 4.3 4.1  CL 94* 94* 94*  CO2 GLUCOSE 100* 181* 264*  BUN 100* 93* 86*  CREATININE 3.42* 2.77* 2.57*  CALCIUM 9.4 8.8* 8.5*    Liver Function Tests:  Recent Labs Lab 11/13/15 1333  AST 18  ALT 9*  ALKPHOS 47  BILITOT 0.4  PROT 6.2*  ALBUMIN 3.2*   No results for input(s): LIPASE, AMYLASE in the last 168 hours. No results for input(s): AMMONIA in the last 168 hours.  CBC:  Recent Labs Lab 11/13/15 1333 11/14/15 1149 11/15/15 0538  WBC 7.1 6.3 5.0  NEUTROABS 5.1  --   --   HGB 9.0* 9.3* 8.4*  HCT 27.9* 29.9* 26.5*  MCV 86.6 86.4 86.9  PLT 175 179 159    Cardiac Enzymes: No results for input(s): CKTOTAL, CKMB, CKMBINDEX, TROPONINI in the last 168 hours.  BNP: BNP (last 3 results)  Recent Labs  05/03/15 0516 05/25/15 1217 09/13/15 1815  BNP 284.3* 214.5* 280.4*    ProBNP (last 3 results) No results for input(s): PROBNP in the last 8760 hours.    Other results:  Imaging: Dg Chest 1 View  11/13/2015  CLINICAL DATA:  Hypotension this am EXAM: CHEST  1 VIEW COMPARISON:  10/18/2015 FINDINGS: Lungs are clear. Heart size and mediastinal contours are within normal limits. Left diaphragmatic leaflet is  obscured as before. No effusion.  No pneumothorax. Visualized skeletal structures are unremarkable. IMPRESSION: No acute cardiopulmonary disease. Electronically Signed   By: Corlis Leak M.D.   On: 11/13/2015 14:21     Medications:     Scheduled Medications: . atorvastatin  20 mg Oral QHS  . gabapentin  300 mg Oral BID  . insulin aspart  0-9 Units Subcutaneous TID WC  . insulin glargine  30 Units Subcutaneous QHS  . pantoprazole  40 mg Oral Daily  . rivaroxaban  15 mg Oral Daily  . sodium chloride  500 mL Intravenous Once    Infusions:    PRN Medications: albuterol, ondansetron **OR** ondansetron (ZOFRAN) IV   Assessment/Plan/Discussion:   1. Hypotension: likely due to over diuresis  as he was taking extra diuretics (was supposed to be off metolazone but as still taking). He got IV fluid initially and diuretics held.  BP improved. 2. AKI/CKD:  Suspect as above with over diuresis. Creatinine baseline ~1.8. Creatinine on admit 3.42 but today down to 2.5.  Volume status is heading up now, no more IV fluid.  - Follow with daily BMETs.  3. Chronic Diastolic HF: EF 60-65% on last echo with RV enlargement. 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 Now developing some JVD, weight up with holding diuretics and IV fluid.  Need to avoid overshooting.   - Start him on torsemide 40 mg bid this evening.  No metolazone.  4. Pulmonary hypertension: Elevated PA pressure on last echo. 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  - On chronic 02.  5 Atrial fibrillation; Persistent. Likely since 2/16.  He has remained in atrial fibrillation despite DCCV attempt 7/16 on amiodarone.  - Off Amiodarone and Coreg due to bradycardia. He remains bradycardic today.  - FOBT +. On lower dose of Xarelto with lower GFR.  6. OSA/suspected OHS: Continue oxygen continuously. He will need to start on CPAP given severe OSA but has not followed up with pulmonary.  7. CAD: Stable, no chest pain.  - Continue xarelto 15.No ASA with stable CAD on Xarelto.  -  Continue statin.  8. DM: - Hgb A1C 08/2015 6.5 . He is on insulin. Per primary 9. Anemia: FOBT+, hemoglobin low but stable.  Needs Xarelto long-term.  Needs followup with GI as outpatient as long as no overt bleeding.  Will use PPI.  Given low transferrin saturation, will add daily iron supplement.   10. Deconditioning - Would like to go to SNF on d/c Will await PT evaluation.  11. Hypothyroidism: New  diagnosis, started levothyroxine.   Cardiac Rehab awaiting PT consult. Saw him previously and he was unable to walk so did not qualify for any Cardiac Rehab.  They will follow PT.   Length of Stay: 2  Admir Candelas PA-C  11/15/2015, 8:15 AM  Advanced Heart Failure Team Pager [phone] (M-F; 7a - 4p)  Please contact [facility] for night-coverage after hours (4p -7a ) and weekends on [email or web address]  Patient seen with PA, agree with the above note.    Admitted with hypotension/AKI due to over-diuresis, was not supposed to be taking metolazone but had continued taking it.  He was volume resuscitated, creatinine coming down, now developing mild fluid overload.  - No further IV fluid.  - Can start back  on torsemide 40 mg bid this evening.   FOBT+ but no overt bleeding.  Hemoglobin staying stable.  Will need GI evaluation as outpatient.  Will need PPI and po iron given low transferrin saturation.   Need to treat hypothyroidism, new diagnosis.   Marca Ancona 11/15/2015 1:18 PM

## 2015-11-15 NOTE — Progress Notes (Signed)
CARDIAC REHAB PHASE I   Pt lying in bed, states he is feeling somewhat better since he was up with PT. Completed CHF education with pt. Reviewed CHF booklet and zone tool, daily weights, sodium and fluid restrictions, heart healthy and diabetes diet information. Pt verbalized understanding, however, flat affect. Pt declined to watch heart failure video. Pt does not qualify for phase 2 cardiac rehab due to EF%.  If pt is to discharge home, pt would benefit from [facility] for CHF/disease management. Pt not appropriate for ambulation with cardiac rehab at this time, will follow PT progress. Pt in bed, call bell within reach.   [phone] Joylene Grapes, RN, BSN 11/15/2015 10:13 AM

## 2015-11-15 NOTE — Clinical Social Work Note (Signed)
Clinical Social Work Assessment  Patient Details  Name: Mark Roberson MRN: [id number] Date of Birth: 07-21-1946  Date of referral:  11/15/15               Reason for consult:  Facility Placement                Permission sought to share information with:    Permission granted to share information::  Yes, Verbal Permission Granted  Name::        Agency::   (SNFs in [facility]- first choice [facility])  Relationship::     Contact Information:     Housing/Transportation Living arrangements for the past 2 months:  Single Family Home Source of Information:  Patient Patient Interpreter Needed:  None Criminal Activity/Legal Involvement Pertinent to Current Situation/Hospitalization:  No - Comment as needed Significant Relationships:  Friend Lives with:  Self Do you feel safe going back to the place where you live?    Need for family participation in patient care:  No (Coment)  Care giving concerns:  No caregivers present at time of discharge   Social Worker assessment / plan:  CSW met with patient at bedside to discuss discharge planning.  Patient has progressed poorly with PT.  PT is recommending SNF at time of discharge.  Patient states he is from home alone where he has had intermittent home health agencies set up.  He has been to [facility] in the past and would like to receive STR there once again.  CSW will initiate SNF search and present bed offers once available.  Employment status:  Retired Forensic scientist:  Medicare PT Recommendations:  [place] / Referral to community resources:  [place]  Patient/Family's Response to care:  Patient is agreeable to SNF  Patient/Family's Understanding of and Emotional Response to Diagnosis, Current Treatment, and Prognosis:  Patient is realistic regarding level of care needed at time of discharge.  Emotional Assessment Appearance:  Appears stated age Attitude/Demeanor/Rapport:     Affect (typically observed):  Accepting Orientation:  Oriented to Self, Oriented to Place, Oriented to  Time, Oriented to Situation Alcohol / Substance use:  Not Applicable Psych involvement (Current and /or in the community):  No (Comment)  Discharge Needs  Concerns to be addressed:  No discharge needs identified Readmission within the last 30 days:  No Current discharge risk:  None Barriers to Discharge:  No Barriers Identified   Dulcy Fanny, LCSW 11/15/2015, 3:09 PM

## 2015-11-16 DIAGNOSIS — E038 Other specified hypothyroidism: Secondary | ICD-10-CM

## 2015-11-16 DIAGNOSIS — N189 Chronic kidney disease, unspecified: Secondary | ICD-10-CM

## 2015-11-16 DIAGNOSIS — I9589 Other hypotension: Secondary | ICD-10-CM

## 2015-11-16 LAB — BASIC METABOLIC PANEL
ANION GAP: 10 (ref 5–15)
BUN: 79 mg/dL — ABNORMAL HIGH (ref 6–20)
CALCIUM: 8.5 mg/dL — AB (ref 8.9–10.3)
CHLORIDE: 97 mmol/L — AB (ref 101–111)
CO2: 24 mmol/L (ref 22–32)
Creatinine, Ser: 2.35 mg/dL — ABNORMAL HIGH (ref 0.61–1.24)
GFR calc Af Amer: 31 mL/min — ABNORMAL LOW (ref >60)
GFR calc non Af Amer: 27 mL/min — ABNORMAL LOW (ref >60)
GLUCOSE: 255 mg/dL — AB (ref 65–99)
POTASSIUM: 4.5 mmol/L (ref 3.5–5.1)
Sodium: 131 mmol/L — ABNORMAL LOW (ref 135–145)

## 2015-11-16 LAB — GLUCOSE, CAPILLARY
Glucose-Capillary: 250 mg/dL — ABNORMAL HIGH (ref 65–99)
Glucose-Capillary: 254 mg/dL — ABNORMAL HIGH (ref 65–99)

## 2015-11-16 LAB — CBC WITH DIFFERENTIAL/PLATELET
Basophils Absolute: 0 10*3/uL (ref 0.0–0.1)
Basophils Relative: 1 %
Eosinophils Absolute: 0.3 10*3/uL (ref 0.0–0.7)
Eosinophils Relative: 5 %
HEMATOCRIT: 26.7 % — AB (ref 39.0–52.0)
Hemoglobin: 8.8 g/dL — ABNORMAL LOW (ref 13.0–17.0)
LYMPHS ABS: 1.3 10*3/uL (ref 0.7–4.0)
LYMPHS PCT: 22 %
MCH: 28.1 pg (ref 26.0–34.0)
MCHC: 33 g/dL (ref 30.0–36.0)
MCV: 85.3 fL (ref 78.0–100.0)
MONO ABS: 1.1 10*3/uL — AB (ref 0.1–1.0)
MONOS PCT: 19 %
NEUTROS ABS: 3.2 10*3/uL (ref 1.7–7.7)
Neutrophils Relative %: 55 %
Platelets: 143 10*3/uL — ABNORMAL LOW (ref 150–400)
RBC: 3.13 MIL/uL — ABNORMAL LOW (ref 4.22–5.81)
RDW: 16.4 % — AB (ref 11.5–15.5)
WBC: 5.9 10*3/uL (ref 4.0–10.5)

## 2015-11-16 MED ORDER — FERROUS SULFATE 325 (65 FE) MG PO TABS: 325.0000 mg | ORAL_TABLET | Freq: Two times a day (BID) | ORAL | Status: DC

## 2015-11-16 MED ORDER — PANTOPRAZOLE SODIUM 40 MG PO TBEC: 40.0000 mg | DELAYED_RELEASE_TABLET | Freq: Every day | ORAL | Status: AC

## 2015-11-16 MED ORDER — LEVOTHYROXINE SODIUM 75 MCG PO TABS: 75.0000 ug | ORAL_TABLET | Freq: Every day | ORAL | Status: DC

## 2015-11-16 MED ORDER — FERROUS SULFATE 325 (65 FE) MG PO TABS
325.0000 mg | ORAL_TABLET | Freq: Two times a day (BID) | ORAL | Status: DC
  Administered 2015-11-16 (×2): 325 mg via ORAL
  Filled 2015-11-16 (×2): qty 1

## 2015-11-16 NOTE — Progress Notes (Signed)
Advanced Heart Failure Rounding Note   Subjective:    Admitted with hypotension and worsening renal function in setting of continuing metolazone instead of prn basis. Receiving NS IV fluids. Diuretics held. Slightly overcorrected and developed mild volume overload  Feeling better. Worked with PT yesterday but didn't make it very far. Recommended for repeat SNF stay. Pt glad of this, wants to go in and be "built back up".  Creatinine trending down 3.4>2.7>2.5>2.35. Baseline presumed to be 1.8-2.0, but may be gradually worsening with his co-morbidities. BUN trending down as well. 86 ->79.   Weight shows up 3 more lbs from admission (10 up overall).  Started back on torsemide last night.    Objective:   Weight Range:  Vital Signs:   Temp:  [97.5 F (36.4 C)-98.2 F (36.8 C)] 97.7 F (36.5 C) (02/23 0736) Pulse Rate:  [50-57] 51 (02/23 0736) Resp:  [14-18] 18 (02/23 0736) BP: (87-128)/(36-56) 124/36 mmHg (02/23 0736) SpO2:  [94 %-100 %] 100 % (02/23 0736) Weight:  [308 lb 10.3 oz (140 kg)] 308 lb 10.3 oz (140 kg) (02/23 0324) Last BM Date: 11/16/15  Weight change: Filed Weights   11/15/15 0401 11/15/15 0712 11/16/15 0324  Weight: 308 lb 3.3 oz (139.8 kg) 305 lb 12.5 oz (138.7 kg) 308 lb 10.3 oz (140 kg)    Intake/Output:   Intake/Output Summary (Last 24 hours) at 11/16/15 0930 Last data filed at 11/16/15 0700  Gross per 24 hour  Intake    960 ml  Output   4225 ml  Net  -3265 ml     Physical Exam: General: Pale appearing. No resp difficulty. In bed  HEENT: Pale normal Neck: supple. JVP 7-8 cm. Carotids 2+ bilat; no bruits. No thyromegaly or nodule noted Cor: PMI nondisplaced. Irregular rate & rhythm. No M/G/R appreciated Lungs: Clear, NAD Abdomen: obese,soft, NT, ND, no HSM. No bruits or masses. +BS  Extremities: no cyanosis, clubbing, rash, edema Neuro: alert & orientedx3, cranial nerves grossly intact. moves all 4 extremities w/o difficulty. Affect  pleasant  Telemetry: Reviewed personally, remains in afib 50s   Labs: Basic Metabolic Panel:  Recent Labs Lab 11/13/15 1333 11/14/15 0457 11/15/15 0538 11/16/15 0400  NA 136 135 132* 131*  K 3.9 4.3 4.1 4.5  CL 94* 94* 94* 97*  CO2 GLUCOSE 100* 181* 264* 255*  BUN 100* 93* 86* 79*  CREATININE 3.42* 2.77* 2.57* 2.35*  CALCIUM 9.4 8.8* 8.5* 8.5*    Liver Function Tests:  Recent Labs Lab 11/13/15 1333  AST 18  ALT 9*  ALKPHOS 47  BILITOT 0.4  PROT 6.2*  ALBUMIN 3.2*   No results for input(s): LIPASE, AMYLASE in the last 168 hours. No results for input(s): AMMONIA in the last 168 hours.  CBC:  Recent Labs Lab 11/13/15 1333 11/14/15 1149 11/15/15 0538 11/16/15 0400  WBC 7.1 6.3 5.0 5.9  NEUTROABS 5.1  --   --  3.2  HGB 9.0* 9.3* 8.4* 8.8*  HCT 27.9* 29.9* 26.5* 26.7*  MCV 86.6 86.4 86.9 85.3  PLT 175 179 159 143*    Cardiac Enzymes: No results for input(s): CKTOTAL, CKMB, CKMBINDEX, TROPONINI in the last 168 hours.  BNP: BNP (last 3 results)  Recent Labs  05/03/15 0516 05/25/15 1217 09/13/15 1815  BNP 284.3* 214.5* 280.4*    ProBNP (last 3 results) No results for input(s): PROBNP in the last 8760 hours.    Other results:  Imaging: No results found.  Medications:     Scheduled Medications: . atorvastatin  20 mg Oral QHS  . ferrous sulfate  325 mg Oral BID WC  . gabapentin  300 mg Oral BID  . insulin aspart  0-9 Units Subcutaneous TID WC  . insulin glargine  30 Units Subcutaneous QHS  . levothyroxine  50 mcg Intravenous Daily  . pantoprazole  40 mg Oral Daily  . rivaroxaban  15 mg Oral Daily  . torsemide  40 mg Oral BID    Infusions:    PRN Medications: albuterol, ondansetron **OR** ondansetron (ZOFRAN) IV   Assessment/Plan/Discussion:   1. Hypotension: likely due to over diuresis as he was taking extra diuretics (was supposed to be off metolazone but as still taking). He got IV fluid initially and  diuretics held.  BP improved. 2. AKI/CKD:  Suspect as above with over diuresis. Creatinine baseline ~1.8. Creatinine on admit 3.42   - No further IV fluid.  Back on Torsemide.  - Trending down.  - Follow with daily BMETs.  3. Chronic Diastolic HF: EF 60-65% on last echo with RV enlargement. 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  - Continue torsemide 40 mg bid. No metolazone.  4. Pulmonary hypertension: Elevated PA pressure on last echo. 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  - On chronic 02.  5 Atrial fibrillation; Persistent. Likely since 2/16.  He has remained in atrial fibrillation despite DCCV attempt 7/16 on amiodarone.  - Off Amiodarone and Coreg due to bradycardia. He remains bradycardic today.  - FOBT +. On lower dose of Xarelto with lower GFR.  6. OSA/suspected OHS: Continue oxygen continuously. He will need to start on CPAP given severe OSA but has not followed up with pulmonary.  7. CAD: Stable, no chest pain.  - Continue xarelto 15.No ASA with stable CAD on Xarelto.  - Continue statin.  8. DM: - Hgb A1C 08/2015 6.5 . He is on insulin. Per primary 9. Anemia: FOBT+  - Hemoglobin low but stable.  Needs Xarelto long-term.   - Needs followup with GI as outpatient as long as no overt bleeding.  Will use PPI for now.   - Given low transferrin saturation, Daily iron supplement added. 10. Deconditioning - Qualified to go back into SNF on d/c.  11. Hypothyroidism:  - New diagnosis, started levothyroxine per primary.   Length of Stay: 3  Xsavier Seeley PA-C  11/16/2015, 9:30 AM  Advanced Heart Failure Team Pager [phone] (M-F; 7a - 4p)  Please contact [facility] for night-coverage after hours (4p  -7a ) and weekends on [email or web address]  Patient seen with PA, agree with the above note.  BUN/creatinine continue to come down.  Torsemide restarted last night.  Would use lower dose torsemide at home, 40 mg po bid.  Would stop metolazone completely.   FOBT+ but no overt bleeding.  Hemoglobin stable. Will need outpatient GI followup.  Appropriate Xarelto dosing per pharmacy.   Marca Ancona 11/16/2015 11:44 AM

## 2015-11-16 NOTE — Care Management Note (Signed)
Case Management Note  Patient Details  Name: Mark Roberson MRN: [id number] Date of Birth: Nov 28, 1945  Subjective/Objective:   Patient is for discharge to [facility] today [facility]), CSW following.                 Action/Plan:   Expected Discharge Date:                  Expected Discharge Plan:  Skilled Nursing Facility  In-House Referral:  Clinical Social Work  Discharge planning Services  CM Consult  Post Acute Care Choice:    Choice offered to:     DME Arranged:    DME Agency:     HH Arranged:    HH Agency:     Status of Service:  Completed, signed off  Medicare Important Message Given:    Date Medicare IM Given:    Medicare IM give by:    Date Additional Medicare IM Given:    Additional Medicare Important Message give by:     If discussed at Long Length of Stay Meetings, dates discussed:    Additional Comments:  Leone Haven, RN 11/16/2015, 11:56 AM

## 2015-11-16 NOTE — Progress Notes (Signed)
ANTICOAGULATION CONSULT NOTE - Follow Up Consult  Pharmacy Consult for Rivaroxaban Indication: atrial fibrillation  Allergies  Allergen Reactions  . Percocet [Oxycodone-Acetaminophen] Other (See Comments)    hallucination  . Penicillins Hives    Has patient had a PCN reaction causing immediate rash, facial/tongue/throat swelling, SOB or lightheadedness with hypotension: No Has patient had a PCN reaction causing severe rash involving mucus membranes or skin necrosis: No Has patient had a PCN reaction that required hospitalization No Has patient had a PCN reaction occurring within the last 10 years: No If all of the above answers are "NO", then may proceed with Cephalosporin use.    Patient Measurements: Height: 6' (182.9 cm) Weight: (!) 308 lb 10.3 oz (140 kg) IBW/kg (Calculated) : 77.6 Vital Signs: Temp: 97.6 F (36.4 C) (02/23 0324) Temp Source: Oral (02/23 0324) BP: 124/42 mmHg (02/23 0330) Pulse Rate: 50 (02/23 0324)  Labs:  Recent Labs  11/14/15 0457 11/14/15 1149 11/15/15 0538 11/16/15 0400  HGB  --  9.3* 8.4* 8.8*  HCT  --  29.9* 26.5* 26.7*  PLT  --  179 159 143*  CREATININE 2.77*  --  2.57* 2.35*    Estimated Creatinine Clearance: 43.1 mL/min (by C-G formula based on Cr of 2.35).    Assessment: 69yom with HFpEF 60% and AFib.  On Rivaroxaban  daily PTA.  Admitted with acute renal failure in setting of over diuresis Cr up to 4 and FOB+ and anemia.  Rivaroxaban dose dropped to  daily for renal function.  Bleeding has resolved, renal function improving Cr 2.3 CrCl 84ml/min using TBW.  Discuss with MD - increase rivaroxaban back to appropriate dose for renal fx.   Goal of Therapy:   Monitor platelets by anticoagulation protocol: Yes   Plan:  Change rivaroxaban to  daily  Leota Sauers Pharm.D. CPP, BCPS Clinical Pharmacist [phone] 11/16/2015 8:01 AM

## 2015-11-16 NOTE — Clinical Social Work Placement (Signed)
   CLINICAL SOCIAL WORK PLACEMENT  NOTE 11/16/15 - DISCHARGED TO [facility]  Date:  11/16/2015  Patient Details  Name: Mark Roberson MRN: [id number] Date of Birth: June 28, 1946  Clinical Social Work is seeking post-discharge placement for this patient at the Skilled  Nursing Facility level of care (*CSW will initial, date and re-position this form in  chart as items are completed):  Yes   Patient/family provided with [facility] Clinical Social Work Department's list of facilities offering this level of care within the geographic area requested by the patient (or if unable, by the patient's family).  Yes   Patient/family informed of their freedom to choose among providers that offer the needed level of care, that participate in Medicare, Medicaid or managed care program needed by the patient, have an available bed and are willing to accept the patient.  Yes   Patient/family informed of [facility]'s ownership interest in [facility] and [facility], as well as of the fact that they are under no obligation to receive care at these facilities.  PASRR submitted to EDS on       PASRR number received on       Existing PASRR number confirmed on 11/15/15     FL2 transmitted to all facilities in geographic area requested by pt/family on 11/15/15     FL2 transmitted to all facilities within larger geographic area on       Patient informed that his/her managed care company has contracts with or will negotiate with certain facilities, including the following:        Yes   Patient/family informed of bed offers received.  Patient chooses bed at  [facility]     Physician recommends and patient chooses bed at      Patient to be transferred to  [facility] on  11/16/15.  Patient to be transferred to facility by  ambulance     Patient's contact Adolph Pollack called [phone]) on 11/16/15 and message left for CSW to be contacted.   Name of family member notified:    Adolph Pollack - patient reported that he lives with Ms. Cope.     PHYSICIAN       Additional Comment:    _______________________________________________ Cristobal Goldmann, LCSW 11/16/2015, 4:14 PM

## 2015-11-16 NOTE — Discharge Instructions (Signed)
Follow with Primary MD [name], MD in 7 days   Get CBC, CMP, TSH, Iron Panel,  2 view Chest X ray checked  by Primary MD next visit.    Activity: As tolerated with Full fall precautions use walker/cane & assistance as needed   Disposition Home    Diet:   Heart Healthy Low Carb  Check your Weight same time everyday, if you gain over 2 pounds, or you develop in leg swelling, experience more shortness of breath or chest pain, call your Primary MD immediately. Follow Cardiac Low Salt Diet and 1.5 lit/day fluid restriction.  Accuchecks 4 times/day, Once in AM empty stomach and then before each meal. Log in all results and show them to your Prim.MD in 3 days. If any glucose reading is under 80 or above 300 call your Prim MD immidiately. Follow Low glucose instructions for glucose under 80 as instructed.  On your next visit with your primary care physician please Get Medicines reviewed and adjusted.   Please request your Prim.MD to go over all Hospital Tests and Procedure/Radiological results at the follow up, please get all Hospital records sent to your Prim MD by signing hospital release before you go home.   If you experience worsening of your admission symptoms, develop shortness of breath, life threatening emergency, suicidal or homicidal thoughts you must seek medical attention immediately by calling 911 or calling your MD immediately  if symptoms less severe.  You Must read complete instructions/literature along with all the possible adverse reactions/side effects for all the Medicines you take and that have been prescribed to you. Take any new Medicines after you have completely understood and accpet all the possible adverse reactions/side effects.   Do not drive, operating heavy machinery, perform activities at heights, swimming or participation in water activities or provide baby sitting services if your were admitted for syncope or siezures until you have seen by Primary  MD or a Neurologist and advised to do so again.  Do not drive when taking Pain medications.    Do not take more than prescribed Pain, Sleep and Anxiety Medications  Special Instructions: If you have smoked or chewed Tobacco  in the last 2 yrs please stop smoking, stop any regular Alcohol  and or any Recreational drug use.  Wear Seat belts while driving.   Please note  You were cared for by a hospitalist during your hospital stay. If you have any questions about your discharge medications or the care you received while you were in the hospital after you are discharged, you can call the unit and asked to speak with the hospitalist on call if the hospitalist that took care of you is not available. Once you are discharged, your primary care physician will handle any further medical issues. Please note that NO REFILLS for any discharge medications will be authorized once you are discharged, as it is imperative that you return to your primary care physician (or establish a relationship with a primary care physician if you do not have one) for your aftercare needs so that they can reassess your need for medications and monitor your lab values.

## 2015-11-16 NOTE — Care Management Important Message (Signed)
Important Message  Patient Details  Name: Mark Roberson MRN: [id number] Date of Birth: [birth year]   Medicare Important Message Given:  Yes    Orson Aloe 11/16/2015, 4:38 PM

## 2015-11-16 NOTE — Progress Notes (Signed)
Report given to [facility] nurse at [facility] via phone. Left my phone number with [facility] for further questions if needed.

## 2015-11-16 NOTE — Discharge Summary (Signed)
Mark Roberson, is a 70 y.o. male  DOB Aug 29, 1946  MRN [id number].  Admission date:  11/13/2015  Admitting Physician  Ozella Rocks, MD  Discharge Date:  11/16/2015   Primary MD  [name], MD  Recommendations for primary care physician for things to follow:   TSH in 2-3 weeks, repeat CBC and BMP within 5-7 days.   Needs close outpatient GI and cardiology follow-up.   Admission Diagnosis  hypotension   Discharge Diagnosis  hypotension     Principal Problem:   Hypotension Active Problems:   DM (diabetes mellitus), type 2 with renal complications (HCC)   AKI (acute kidney injury) (HCC)   Dyslipidemia   Hypothyroidism   On home oxygen therapy   CHF exacerbation (HCC)   Acute blood loss anemia   Renal failure (ARF), acute on chronic (HCC)   Acute-on-chronic kidney injury (HCC)      Past Medical History  Diagnosis Date  . CHF (congestive heart failure) (HCC)   . Atrial fibrillation (HCC)   . Diabetes mellitus without complication (HCC)   . Hypertension   . Sleep apnea   . Asthma   . CKD (chronic kidney disease), stage III   . Anemia     Past Surgical History  Procedure Laterality Date  . 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 Wound debridement Right   . Multiple extractions with alveoloplasty N/A 01/17/2015    Procedure: Extraction of tooth #'s 14,15, 16 with alveolopalsty;  Surgeon: Charlynne Pander, DDS;  Location: [facility] OR;  Service: Oral Surgery;  Laterality: N/A;  . Cardioversion N/A 04/03/2015    Procedure: CARDIOVERSION;  Surgeon: Laurey Morale, MD;  Location: [facility] ENDOSCOPY;  Service: Cardiovascular;  Laterality: N/A;       HPI  from the history and physical done on the day of admission:    Mark Roberson is a 70 y.o. male with a history of Chronic diastolic CHF, atrial  fibrillation on Xarelto, CAD, CKD stage III, Recently admitted to the hospital from 1/25 through 10/23/2015 for acute on chronic CKD, presenting to the ED with dizziness, dehydration, generalized weakness, poor oral intake and ongoing watery diarrhea. He denied any nausea or vomiting. He denied any worsening shortness of breath or chest pain. No confusion was reported. He had been evaluated at the heart failure clinic in 1/30 at which time he was found to be stable clinically. He was on torsemide bid. His weight was stable at 308 lbs. Since then, his weight had been trending down, to 294 pounds today. Today, he was found to be hypotensive with 68 SBP ( in the 50's prior to presentation) after over-diuresing at home, requiring IV fluids. Chest x-ray was negative for active disease.K was 3.9, Cr 3.42, BUN100, Tn 0.04, and Hb 9 with normal WBC at 7.1. His most recent echo on 09/12/2015 showed ejection fraction of 65-70%.EKG Afib without changes from prior, QTC 489. Cardiology was consulted and his meds were adjusted, holding diuresis and replenishing fluids.  Hospital Course:     1. 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 Baseline creatinine appears to be close to 2.5, diuretics were held, he has stopped ACE inhibitor and other nephrotoxins, improved with hydration, was also diagnosed with undiagnosed severe hypothyroidism with TSH of 18, to begin to his hypotension and renal failure, he was started on IV Synthroid along with IV fluids. Much improved blood pressure and renal function. Overall feels better. Discussed his case with Dr. Ronelle Nigh cardiologist. Will be placed on 40 twice a day of Demadex upon discharge, discontinue Zaroxolyn and ACE inhibitor, close outpatient cardiology follow-up.  2. Chronic diastolic CHF EF around 60%. Currently dehydrated. Hydrate.  3. Dyslipidemia. On statin continue.  4. Diabetic peripheral neuropathy. Continue Neurontin.  Monitored renalfunction.   5. GERD. On PPI continue.  6. Chronic atrial fibrillation. [place] vasc 2 score for least 3. Continue xaralto. Goal is rate control.  7. Morbid obesity with obstructive sleep apnea. Outpatient follow-up with PCP for weight reduction, CPAP at night.  8. Anemia of chronic disease. Is on xaralto, inconclusive anemia panel. We'll recommend outpatient age-appropriate anemia workup. Continue PPI long with low-dose iron supplementation, monitor Iron panel and CBC closely. No signs of overt GI bleed. Must see GI in 1-2 weeks for outpatient workup.  9. [place] hypothyroidism. TSH 18. He was started on IV Synthroid and now transitioned to oral with good effect ,  his undiagnosed hypothyroidism could be 1 to be contribution to his hypotension, flat affect, cold intolerance and constipation. He already feels a whole lot better. Repeat TSH in 2-3 weeks.   10. Diabetes mellitus type 2. A1c was under 6.5, continue home regimen, check CBGs before every meal CHS.       Discharge Condition: Stable  Follow UP  Follow-up Information    Follow up with [name], MD. Schedule an appointment as soon as possible for a visit in 1 week.   Specialty:  Internal Medicine   Why:  CBC, Iron panel, BMP   Contact information:   [address] [place] [address] [phone]       Follow up with Marca Ancona, MD. Schedule an appointment as soon as possible for a visit in 1 week.   Specialty:  Cardiology   Contact information:   [address] [place] [address] [phone]       Follow up with Stan Head, MD. Schedule an appointment as soon as possible for a visit in 1 week.   Specialty:  Gastroenterology   Why:  Iron deficiency Anaemia, Haem +ve stool   Contact information:   [address] [place] [address] [phone]        Consults obtained - Cards  Diet and Activity recommendation: See Discharge Instructions  below  Discharge Instructions       Discharge Instructions    Discharge instructions    Complete by:  As directed   Follow with Primary MD [name], MD in 7 days   Get CBC, TSH, CMP, Iron Panel,  2 view Chest X ray checked  by Primary MD next visit.    Activity: As tolerated with Full fall precautions use walker/cane & assistance as needed   Disposition Home    Diet:   Heart Healthy Low Carb  Check your Weight same time everyday, if you gain over 2 pounds, or you develop in leg swelling, experience more shortness of breath or chest  pain, call your Primary MD immediately. Follow Cardiac Low Salt Diet and 1.5 lit/day fluid restriction.  Accuchecks 4 times/day, Once in AM empty stomach and then before each meal. Log in all results and show them to your Prim.MD in 3 days. If any glucose reading is under 80 or above 300 call your Prim MD immidiately. Follow Low glucose instructions for glucose under 80 as instructed.  On your next visit with your primary care physician please Get Medicines reviewed and adjusted.   Please request your Prim.MD to go over all Hospital Tests and Procedure/Radiological results at the follow up, please get all Hospital records sent to your Prim MD by signing hospital release before you go home.   If you experience worsening of your admission symptoms, develop shortness of breath, life threatening emergency, suicidal or homicidal thoughts you must seek medical attention immediately by calling 911 or calling your MD immediately  if symptoms less severe.  You Must read complete instructions/literature along with all the possible adverse reactions/side effects for all the Medicines you take and that have been prescribed to you. Take any new Medicines after you have completely understood and accpet all the possible adverse reactions/side effects.   Do not drive, operating heavy machinery, perform activities at heights, swimming or participation in  water activities or provide baby sitting services if your were admitted for syncope or siezures until you have seen by Primary MD or a Neurologist and advised to do so again.  Do not drive when taking Pain medications.    Do not take more than prescribed Pain, Sleep and Anxiety Medications  Special Instructions: If you have smoked or chewed Tobacco  in the last 2 yrs please stop smoking, stop any regular Alcohol  and or any Recreational drug use.  Wear Seat belts while driving.   Please note  You were cared for by a hospitalist during your hospital stay. If you have any questions about your discharge medications or the care you received while you were in the hospital after you are discharged, you can call the unit and asked to speak with the hospitalist on call if the hospitalist that took care of you is not available. Once you are discharged, your primary care physician will handle any further medical issues. Please note that NO REFILLS for any discharge medications will be authorized once you are discharged, as it is imperative that you return to your primary care physician (or establish a relationship with a primary care physician if you do not have one) for your aftercare needs so that they can reassess your need for medications and monitor your lab values.     Increase activity slowly    Complete by:  As directed              Discharge Medications       Medication List    STOP taking these medications        lisinopril 10 MG tablet  Commonly known as:  PRINIVIL,ZESTRIL     metolazone 2.5 MG tablet  Commonly known as:  ZAROXOLYN      TAKE these medications        acetaminophen 500 MG tablet  Commonly known as:  TYLENOL  Take 500 mg by mouth every 4 (four) hours as needed (pain). Do not exceed 4 gms of tylenol in 24 hours     albuterol (2.5 MG/3ML) 0.083% nebulizer solution  Commonly known as:  PROVENTIL  Take 3 mLs (2.5 mg total) by nebulization  every 6 (six) hours  as needed for wheezing or shortness of breath.     PROAIR HFA 108 (90 Base) MCG/ACT inhaler  Generic drug:  albuterol  INHALE 1 TO 2 PUFFS BY MOUTH FOUR TIMES DAILY AS DIRECTED AS NEEDED FOR WHEEZING AND SHORTNESS OF BREATH     atorvastatin 20 MG tablet  Commonly known as:  LIPITOR  Take 20 mg by mouth at bedtime.     B-D ULTRAFINE III SHORT PEN 31G X 8 MM Misc  Generic drug:  Insulin Pen Needle  USE AS DIRECTED. THREE TIMES DAILY     cholecalciferol 1000 units tablet  Commonly known as:  VITAMIN D  Take 1,000 Units by mouth daily.     Fish Oil 1000 MG Caps  Take 2,000 mg by mouth daily.     gabapentin 300 MG capsule  Commonly known as:  NEURONTIN  Take 300 mg by mouth 2 (two) times daily.     HUMALOG KWIKPEN 100 UNIT/ML KiwkPen  Generic drug:  insulin lispro  Inject 0.3-0.4 mLs (30-40 Units total) into the skin 3 (three) times daily. Inject 3 time daily just before each meal 40-30-40     LANTUS SOLOSTAR 100 UNIT/ML Solostar Pen  Generic drug:  Insulin Glargine  Inject 40 Units into the skin at bedtime.     levothyroxine 75 MCG tablet  Commonly known as:  SYNTHROID  Take 1 tablet (75 mcg total) by mouth daily before breakfast.     Magnesium 400 MG Caps  Take 400 mg by mouth daily.     niacin 250 MG tablet  Take 250 mg by mouth daily.     pantoprazole 40 MG tablet  Commonly known as:  PROTONIX  Take 1 tablet (40 mg total) by mouth daily.     potassium chloride SA 20 MEQ tablet  Commonly known as:  K-DUR,KLOR-CON  Take 1 tablet (20 mEq total) by mouth 2 (two) times a week. Mon / Fri     rivaroxaban 20 MG Tabs tablet  Commonly known as:  XARELTO  Take 1 tablet (20 mg total) by mouth daily with supper.     torsemide 20 MG tablet  Commonly known as:  DEMADEX  Take 2 tablets (40 mg total) by mouth 2 (two) times daily. Do not take on 10/21/15 & 10/22/15. Start taking on 10/23/15.        Major procedures and Radiology Reports - PLEASE review detailed and final  reports for all details, in brief -       Dg Chest 1 View  11/13/2015  CLINICAL DATA:  Hypotension this am EXAM: CHEST  1 VIEW COMPARISON:  10/18/2015 FINDINGS: Lungs are clear. Heart size and mediastinal contours are within normal limits. Left diaphragmatic leaflet is obscured as before. No effusion.  No pneumothorax. Visualized skeletal structures are unremarkable. IMPRESSION: No acute cardiopulmonary disease. Electronically Signed   By: Corlis Leak M.D.   On: 11/13/2015 14:21   Dg Chest 2 View  10/18/2015  CLINICAL DATA:  Fatigue for the past few days.  Initial encounter. EXAM: CHEST  2 VIEW COMPARISON:  PA and lateral chest 09/13/2015 and 05/02/2015. FINDINGS: The lungs are clear. Heart size is upper normal. No pneumothorax or pleural effusion. IMPRESSION: No acute disease. Electronically Signed   By: Drusilla Kanner M.D.   On: 10/18/2015 20:24    Micro Results      No results found for this or any previous visit (from the past 240 hour(s)).  Today   Subjective    Mark Roberson today has no headache,no chest abdominal pain,no new weakness tingling or numbness, feels much better  .    Objective   Blood pressure 124/36, pulse 51, temperature 97.7 F (36.5 C), temperature source Oral, resp. rate 18, height 6' (1.829 m), weight 140 kg (308 lb 10.3 oz), SpO2 100 %.   Intake/Output Summary (Last 24 hours) at 11/16/15 0949 Last data filed at 11/16/15 0700  Gross per 24 hour  Intake    960 ml  Output   4225 ml  Net  -3265 ml    Exam Awake Alert, Oriented x 3, No new F.N deficits, Normal affect [place].AT,PERRAL Supple Neck,No JVD, No cervical lymphadenopathy appriciated.  Symmetrical Chest wall movement, Good air movement bilaterally, CTAB RRR,No Gallops,Rubs or new Murmurs, No Parasternal Heave +ve B.Sounds, Abd Soft, Non tender, No organomegaly appriciated, No rebound -guarding or rigidity. No Cyanosis, Clubbing or edema, No new Rash or bruise   Data Review   CBC w  Diff: Lab Results  Component Value Date   WBC 5.9 11/16/2015   WBC 9.8 04/03/2015   HGB 8.8* 11/16/2015   HCT 26.7* 11/16/2015   HCT 25.3* 09/15/2015   PLT 143* 11/16/2015   LYMPHOPCT 22 11/16/2015   MONOPCT 19 11/16/2015   EOSPCT 5 11/16/2015   BASOPCT 1 11/16/2015    CMP: Lab Results  Component Value Date   NA 131* 11/16/2015   NA 135* 03/28/2015   K 4.5 11/16/2015   CL 97* 11/16/2015   CO2 24 11/16/2015   BUN 79* 11/16/2015   BUN 75* 03/28/2015   CREATININE 2.35* 11/16/2015   CREATININE 1.8* 03/28/2015   GLU 333 03/28/2015   PROT 6.2* 11/13/2015   ALBUMIN 3.2* 11/13/2015   BILITOT 0.4 11/13/2015   ALKPHOS 47 11/13/2015   AST 18 11/13/2015   ALT 9* 11/13/2015  . Lab Results  Component Value Date   HGBA1C 6.5* 09/15/2015    Lab Results  Component Value Date   TSH 18.746* 11/15/2015      Total Time in preparing paper work, data evaluation and todays exam - 35 minutes  Leroy Sea M.D on 11/16/2015 at 9:49 AM  [facility]   Office  [phone]

## 2015-11-17 ENCOUNTER — Telehealth: Payer: Self-pay | Admitting: *Deleted

## 2015-11-17 NOTE — Telephone Encounter (Signed)
Transition Care Management Follow-up Telephone Call   Date discharged? 11/16/15   How have you been since you were released from the hospital? Pt state she is doing ok   Do you understand why you were in the hospital? YES   Do you understand the discharge instructions? YES   Where were you discharged to? Home   Items Reviewed:  Medications reviewed: YES  Allergies reviewed: YES  Dietary changes reviewed: YES  Referrals reviewed: he states still waiting on cardiology appt   Functional Questionnaire:  Activities of Daily Living (ADLs):   He states he are independent in the following: ambulation, bathing and hygiene, feeding, continence, grooming, toileting and dressing States he doesn't require assistance with the following:   Any transportation issues/concerns?: NO   Any patient concerns? NO   Confirmed importance and date/time of follow-up visits scheduled YES, appt 11/23/15  Provider Appointment booked with Dr. Okey Dupre  Confirmed with patient if condition begins to worsen call PCP or go to the ER.  Patient was given the office number and encouraged to call back with question or concerns.  : yes

## 2015-11-20 ENCOUNTER — Encounter: Payer: Self-pay | Admitting: Adult Health

## 2015-11-20 ENCOUNTER — Non-Acute Institutional Stay (SKILLED_NURSING_FACILITY): Payer: Medicare Other | Admitting: Adult Health

## 2015-11-20 DIAGNOSIS — J9601 Acute respiratory failure with hypoxia: Secondary | ICD-10-CM

## 2015-11-20 DIAGNOSIS — N184 Chronic kidney disease, stage 4 (severe): Secondary | ICD-10-CM | POA: Diagnosis not present

## 2015-11-20 DIAGNOSIS — E1121 Type 2 diabetes mellitus with diabetic nephropathy: Secondary | ICD-10-CM

## 2015-11-20 DIAGNOSIS — I4891 Unspecified atrial fibrillation: Secondary | ICD-10-CM | POA: Diagnosis not present

## 2015-11-20 DIAGNOSIS — I5032 Chronic diastolic (congestive) heart failure: Secondary | ICD-10-CM | POA: Diagnosis not present

## 2015-11-20 DIAGNOSIS — Z794 Long term (current) use of insulin: Secondary | ICD-10-CM | POA: Diagnosis not present

## 2015-11-20 DIAGNOSIS — E785 Hyperlipidemia, unspecified: Secondary | ICD-10-CM | POA: Diagnosis not present

## 2015-11-20 DIAGNOSIS — G4733 Obstructive sleep apnea (adult) (pediatric): Secondary | ICD-10-CM

## 2015-11-20 DIAGNOSIS — D509 Iron deficiency anemia, unspecified: Secondary | ICD-10-CM | POA: Diagnosis not present

## 2015-11-20 DIAGNOSIS — E038 Other specified hypothyroidism: Secondary | ICD-10-CM | POA: Diagnosis not present

## 2015-11-20 DIAGNOSIS — G8929 Other chronic pain: Secondary | ICD-10-CM | POA: Diagnosis not present

## 2015-11-20 NOTE — Progress Notes (Signed)
Patient ID: Mark Roberson, male   DOB: 09/17/1946, 70 y.o.   MRN: [id number]   Facility: [facility]       Allergies  Allergen Reactions  . Percocet [Oxycodone-Acetaminophen] Other (See Comments)    hallucination  . Penicillins Hives    Chief Complaint  Patient presents with  . Hospitalization Follow-up    Follow up    HPI:  He has been hospitalized for hypotension; his ace/zaroxyln were stopped due to his poor renal function. He was started on synthroid this hospitalization. He was hydrated in the hospital. He is here for short term rehab with his goal to return back home. There are no nursing concerns at this time.   Past Medical History  Diagnosis Date  . CHF (congestive heart failure) (HCC)   . Atrial fibrillation (HCC)   . Diabetes mellitus without complication (HCC)   . Hypertension   . Sleep apnea   . Asthma   . CKD (chronic kidney disease), stage III   . Anemia     Past Surgical History  Procedure Laterality Date  . 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 Wound debridement Right   . Multiple extractions with alveoloplasty N/A 01/17/2015    Procedure: Extraction of tooth #'s 14,15, 16 with alveolopalsty;  Surgeon: Charlynne Pander, DDS;  Location: [facility] OR;  Service: Oral Surgery;  Laterality: N/A;  . Cardioversion N/A 04/03/2015    Procedure: CARDIOVERSION;  Surgeon: Laurey Morale, MD;  Location: [facility] ENDOSCOPY;  Service: Cardiovascular;  Laterality: N/A;    VITAL SIGNS BP 140/62 mmHg  Pulse 66  Temp(Src) 95 F (35 C) (Oral)  Resp 20  Ht 6' (1.829 m)  Wt 308 lb (139.708 kg)  BMI 41.76 kg/m2  SpO2 95%  Patient's Medications  New Prescriptions   No medications on file  Previous Medications   ACETAMINOPHEN (TYLENOL) 500 MG TABLET    Take 500 mg by mouth every 4 (four) hours as needed (pain). Do not exceed 4 gms of tylenol in 24 hours   ALBUTEROL (PROVENTIL) (2.5 MG/3ML) 0.083% NEBULIZER SOLUTION    Take 3 mLs (2.5 mg total) by nebulization every 6  (six) hours as needed for wheezing or shortness of breath.   ATORVASTATIN (LIPITOR) 20 MG TABLET    Take 20 mg by mouth at bedtime.    B-D ULTRAFINE III SHORT PEN 31G X 8 MM MISC    USE AS DIRECTED. THREE TIMES DAILY   CHOLECALCIFEROL (VITAMIN D) 1000 UNITS TABLET    Take 1,000 Units by mouth daily.   GABAPENTIN (NEURONTIN) 300 MG CAPSULE    Take 300 mg by mouth 2 (two) times daily.    HUMALOG KWIKPEN 100 UNIT/ML KIWKPEN    Inject 0.3-0.4 mLs (30-40 Units total) into the skin 3 (three) times daily. Inject 3 time daily just before each meal 40-30-40   INSULIN GLARGINE (LANTUS SOLOSTAR) 100 UNIT/ML SOLOSTAR PEN    Inject 40 Units into the skin at bedtime.    LEVOTHYROXINE (SYNTHROID) 75 MCG TABLET    Take 1 tablet (75 mcg total) by mouth daily before breakfast.   MAGNESIUM 400 MG CAPS    Take 400 mg by mouth daily.   NIACIN 250 MG TABLET    Take 250 mg by mouth daily.   OMEGA-3 FATTY ACIDS (FISH OIL) 1000 MG CAPS    Take 2,000 mg by mouth daily.   PANTOPRAZOLE (PROTONIX) 40 MG TABLET    Take 1 tablet (40 mg total)  by mouth daily.   POTASSIUM CHLORIDE SA (K-DUR,KLOR-CON) 20 MEQ TABLET    Take 1 tablet (20 mEq total) by mouth 2 (two) times a week. Mon / Fri   PROAIR HFA 108 (90 BASE) MCG/ACT INHALER    INHALE 1 TO 2 PUFFS BY MOUTH FOUR TIMES DAILY AS DIRECTED AS NEEDED FOR WHEEZING AND SHORTNESS OF BREATH   RIVAROXABAN (XARELTO) 20 MG TABS TABLET    Take 1 tablet (20 mg total) by mouth daily with supper.   TORSEMIDE (DEMADEX) 20 MG TABLET    Take 2 tablets (40 mg total) by mouth 2 (two) times daily. Do not take on 10/21/15 & 10/22/15. Start taking on 10/23/15.  Modified Medications   No medications on file  Discontinued Medications   No medications on file     SIGNIFICANT DIAGNOSTIC EXAMS   05-02-15: chest x-ray: Interstitial pulmonary edema compatible with mild CHF.  05-03-15: 2-d echo: Left ventricle: The cavity size was normal. Wall thickness was normal. Systolic function was vigorous. The  estimated ejection fraction was in the range of 65% to 70%. Wall motion was normal; there were no regional wall motion abnormalities. Doppler parameters are consistent with elevated mean left atrial fillingpressure. - Aortic valve: Valve area (Vmax): 2.71 cm^2. - Mitral valve: Moderately calcified annulus. There was mild to moderate regurgitation. Valve area by pressure half-time: 1.71 cm^2. Valve area by continuity equation (using LVOT flow): 2.39 cm^2. - Left atrium: The atrium was severely dilated. - Right ventricle: The cavity size was mildly dilated. - Right atrium: The atrium was moderately to severely dilated. - Pulmonary arteries: Systolic pressure was moderately increased. PA peak pressure: 58 mm Hg (S).  05-04-15:renal ultrasound: Small left kidney with renal cortical thinning compared to the normal-appearing right kidney. This size discrepancy raises question of renal artery stenosis on the left. In this regard, question whether patient is hypertensive. Study otherwise unremarkable.  09-04-15: 2-d echo: - Left ventricle: The cavity size was mildly dilated. Wall thickness was increased in a pattern of mild LVH. Systolic function was vigorous. The estimated ejection fraction was in the range of 65% to 70%. Wall motion was normal; there were no regional wall motion abnormalities. Doppler parameters are consistent with high ventricular filling pressure. - Mitral valve: Severely calcified annulus. There was mild regurgitation. - Left atrium: The atrium was moderately dilated. - Right atrium: The atrium was mildly dilated. - Pulmonary arteries: Systolic pressure was mildly increased.  11-13-15: chest x-ray: No acute cardiopulmonary disease    LABS REVIEWED:   05-02-15: wbc 8.5; hgb 8.2; hct 28.4; mcv 86.6 ;pt 173; glucose 212; bun 83; creat 2.80; k+4.0  na++134; mag 2.8; BNP 284.3 05-06-15: wbc 7.7; hgb 83; hct 28.0; mcv 86.2; plt 179; glucose 69; bun 72; creat 1.93; k+4.0; na++137;  05-18-15:  wbc 8.5; hgb 8.5; hct 28.9; mcv 87.6; plt 157; glucose 173; bun 77; creat 2.18; k+4.2; na++141; tsh 12.066  05-24-15: glucose 155; bun 65; creat 1.65; k+ 4.1; na++140  10-19-15: wbc 7.2; hgb 9.8; hct 31.9; mcv 87.9; plt 185; glucose 176; bun 76; creat 2.56; k+ 3.6; na++ 135; liver normal albumin 3.3 11-13-15; wbc 7.1; hgb 9.0; hct 27.9; mcv 86.6; plt 175; glucose 100; bun 100; creat 3.42; k+ 3.9; na++ 136; liver normal albumin 3.2 11-14-15; vitamin B12: 420; folate 14.6; iron 38; tibc 392; ferritin 26 11-15-15: tsh 18.746; cortisol 8.3       Review of Systems Constitutional: Negative for appetite change and fatigue.  HENT: Negative for congestion.  Respiratory: Negative for cough, chest tightness and shortness of breath.   Cardiovascular: Negative for chest pain, palpitations and leg swelling.  Gastrointestinal: Negative for nausea, abdominal pain, diarrhea and constipation.  Musculoskeletal: Negative for myalgias and arthralgias.  Skin: Negative for pallor.  Neurological: Negative for dizziness.  Psychiatric/Behavioral: The patient is not nervous/anxious.      Physical Exam Constitutional: He is oriented to person, place, and time. No distress.  Obese   Eyes: Conjunctivae are normal.  Neck: Neck supple. No JVD present. No thyromegaly present.  Cardiovascular: Normal rate, regular rhythm and intact distal pulses.   Respiratory: Effort normal and breath sounds normal. No respiratory distress. He has no wheezes.  GI: Soft. Bowel sounds are normal. He exhibits no distension. There is no tenderness.  Musculoskeletal: He exhibits edema.  Able to move all extremities  Has trace lower extremity edema   Lymphadenopathy:    He has no cervical adenopathy.  Neurological: He is alert and oriented to person, place, and time.  Skin: Skin is warm and dry. He is not diaphoretic.  Psychiatric: He has a normal mood and affect.     ASSESSMENT/ PLAN:  1. Hypothyroidism: will continue synthroid  75 mc daily and will check tsh in 3 weeks.   2. Dyslipidemia: will continue lipitor 20 mg daily niacin 250 mg daily and fish oil 2 gm daily   3. Gerd: will continue protonix 40 mg daily   4. Diabetes: will continue humalog 40 units breakfast; 30 units lunch and 40 units supper; lantus 40 units nightly   5. Afib: heart rate stable will continue xarelto 20 mg nightly and will monitor  6. Diastolic heart failure: will continue demadex 40 mg twice daily with k+ 20 meq twice weekly will weigh him weekly   7. Chronic respiratory failure has obstructive sleep apnea: is on 02; will continue albuterol neb every 6 hours as needed and albuterol 1 or 2 puffs 4 times daily as needed  8. Chronic renal disease stage IV: renal function without change creat/bun: 3.42/100. Will monitor his ace and xaroxlyn  were stopped during his hospitalization   9. Anemia of chronic disease: will monitor hgb 9.0; iron 38  10. Chronic pain; will continue neurontin 300 mg twice daily     Will check cbc; bmp   Time spent with patient 50   minutes >50% time spent counseling; reviewing medical record; tests; labs; and developing future plan of care   Synthia Innocent NP [facility]  Contact [phone] Monday through Friday 8am- 5pm  After hours call [phone]

## 2015-11-21 ENCOUNTER — Encounter: Payer: Self-pay | Admitting: Internal Medicine

## 2015-11-21 ENCOUNTER — Non-Acute Institutional Stay (SKILLED_NURSING_FACILITY): Payer: Medicare Other | Admitting: Internal Medicine

## 2015-11-21 DIAGNOSIS — I4891 Unspecified atrial fibrillation: Secondary | ICD-10-CM | POA: Diagnosis not present

## 2015-11-21 DIAGNOSIS — E038 Other specified hypothyroidism: Secondary | ICD-10-CM

## 2015-11-21 DIAGNOSIS — D509 Iron deficiency anemia, unspecified: Secondary | ICD-10-CM

## 2015-11-21 DIAGNOSIS — R5381 Other malaise: Secondary | ICD-10-CM

## 2015-11-21 DIAGNOSIS — N183 Chronic kidney disease, stage 3 unspecified: Secondary | ICD-10-CM

## 2015-11-21 DIAGNOSIS — I5032 Chronic diastolic (congestive) heart failure: Secondary | ICD-10-CM | POA: Diagnosis not present

## 2015-11-21 DIAGNOSIS — E785 Hyperlipidemia, unspecified: Secondary | ICD-10-CM

## 2015-11-21 DIAGNOSIS — E1121 Type 2 diabetes mellitus with diabetic nephropathy: Secondary | ICD-10-CM | POA: Diagnosis not present

## 2015-11-21 DIAGNOSIS — G8929 Other chronic pain: Secondary | ICD-10-CM | POA: Diagnosis not present

## 2015-11-21 DIAGNOSIS — E034 Atrophy of thyroid (acquired): Secondary | ICD-10-CM

## 2015-11-21 DIAGNOSIS — Z794 Long term (current) use of insulin: Secondary | ICD-10-CM | POA: Diagnosis not present

## 2015-11-21 NOTE — Progress Notes (Signed)
Patient ID: Mark Roberson, male   DOB: Mar 23, 1946, 70 y.o.   MRN: [id number]    HISTORY AND PHYSICAL   DATE: 11/21/15  Location:  [facility] of Service: SNF [phone])   Extended Emergency Contact Information Primary Emergency Contact: [place] Phone: [phone] Relation: None Secondary Emergency Contact: Cope,Rich Address: [address]          [place],  [address] Johnnette Litter of [place] Phone: [phone] Mobile Phone: [phone] Relation: Friend  Advanced Directive information Does patient have an advance directive?: Yes, Type of Advance Directive: Out of facility DNR (pink MOST or yellow form), Does patient want to make changes to advanced directive?: No - Patient declined  Chief Complaint  Patient presents with  . New Admit To SNF    HPI:  70 yo male seen today as a new admission into SNF following hospital stay for hypotension, AKI, CHF exacerbation/O2 dependent, DM, afib.   He is c/a insulin regimen and would like to get injections before his meals for better BS results. CBGs elevated  Hypothyroidism - takes synthroid 75 mcg daily. TSH 18.746  Dyslipidemia - stable on lipitor 20 mg daily, niacin 250 mg daily, and fish oil 2 gm daily   GERD - sx's stable on protonix 40 mg daily   DM - takes humalog insulin qAC and lantus qhs. Followed by Endocrinology  Afib - rate controlled. Takes xarelto 20 mg nightly  Diastolic heart failure - stable on demadex 40 mg twice daily with k+ 20 meq twice weekly  Chronic respiratory failure has obstructive sleep apnea -  is on 02;also takes albuterol neb every 6 hours as needed and albuterol 1 or 2 puffs 4 times daily as needed  CKD stage IV - stable creat/bun: 3.42/100. ACEI and zaroxolyn stopped during his hospitalization   Anemia of chronic disease - stable with  hgb 9.0; iron 38  Chronic pain - pain stable on neurontin 300 mg twice daily       Past Medical History  Diagnosis Date  . CHF (congestive heart failure) ([facility])   . Atrial fibrillation ([facility])   . Diabetes mellitus without complication ([facility])   . Hypertension   . Sleep apnea   . Asthma   . CKD (chronic kidney disease), stage III   . Anemia     Past Surgical History  Procedure Laterality Date  . 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 Wound debridement Right   . Multiple extractions with alveoloplasty N/A 01/17/2015    Procedure: Extraction of tooth #'s 14,15, 16 with alveolopalsty;  Surgeon: Lenn Cal, DDS;  Location: [facility];  Service: Oral Surgery;  Laterality: N/A;  . Cardioversion N/A 04/03/2015    Procedure: CARDIOVERSION;  Surgeon: Larey Dresser, MD;  Location: [facility];  Service: Cardiovascular;  Laterality: N/A;    Patient Care Team: Hoyt Koch, MD as PCP - General (Internal Medicine)  Social History   Social History  . Marital Status: Single    Spouse Name: N/A  . Number of Children: N/A  . Years of Education: N/A   Occupational History  . Not on file.   Social History Main Topics  . Smoking status: Never Smoker   . Smokeless tobacco: Never Used  . Alcohol Use: Yes     Comment: rare  . Drug Use: No  . Sexual Activity: Not on file   Other Topics Concern  .  Not on file   Social History Narrative     reports that he has never smoked. He has never used smokeless tobacco. He reports that he drinks alcohol. He reports that he does not use illicit drugs.  Family History  Problem Relation Age of Onset  . COPD Mother   . Heart disease Father   . Cancer Sister     breast  . Heart disease Brother   . Diabetes Neg Hx    No family status information on file.    Immunization History  Administered Date(s) Administered  . Influenza,inj,Quad PF,36+ Mos 07/07/2014, 09/14/2015  . PPD Test 01/31/2015  . Pneumococcal Polysaccharide-23 07/07/2014    Allergies  Allergen Reactions  . Percocet  [Oxycodone-Acetaminophen] Other (See Comments)    hallucination  . Penicillins Hives    Has patient had a PCN reaction causing immediate rash, facial/tongue/throat swelling, SOB or lightheadedness with hypotension: No Has patient had a PCN reaction causing severe rash involving mucus membranes or skin necrosis: No Has patient had a PCN reaction that required hospitalization No Has patient had a PCN reaction occurring within the last 10 years: No If all of the above answers are "NO", then may proceed with Cephalosporin use.    Medications: Patient's Medications  New Prescriptions   No medications on file  Previous Medications   ACETAMINOPHEN (TYLENOL) 500 MG TABLET    Take 500 mg by mouth every 4 (four) hours as needed (pain). Do not exceed 4 gms of tylenol in 24 hours   ALBUTEROL (PROVENTIL HFA;VENTOLIN HFA) 108 (90 BASE) MCG/ACT INHALER    Inhale 1 puff into the lungs every 6 (six) hours as needed for wheezing or shortness of breath.   ALBUTEROL (PROVENTIL HFA;VENTOLIN HFA) 108 (90 BASE) MCG/ACT INHALER    Inhale 2 puffs into the lungs every 6 (six) hours as needed for wheezing or shortness of breath.   ALBUTEROL (PROVENTIL) (2.5 MG/3ML) 0.083% NEBULIZER SOLUTION    Take 3 mLs (2.5 mg total) by nebulization every 6 (six) hours as needed for wheezing or shortness of breath.   ATORVASTATIN (LIPITOR) 20 MG TABLET    Take 20 mg by mouth at bedtime. For HLD   CHOLECALCIFEROL (VITAMIN D) 1000 UNITS TABLET    Take 1,000 Units by mouth daily. for bone health   GABAPENTIN (NEURONTIN) 300 MG CAPSULE    Take 300 mg by mouth 2 (two) times daily. For diabetic neuropathy   INSULIN GLARGINE (LANTUS SOLOSTAR) 100 UNIT/ML SOLOSTAR PEN    Inject 40 Units into the skin at bedtime.    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   MAGNESIUM 400 MG CAPS    Take 400 mg by mouth daily.   NIACIN 250 MG TABLET    Take 250 mg by mouth daily.   OMEGA-3 FATTY ACIDS (FISH OIL) 1000 MG CAPS    Take 2,000 mg by mouth daily. For HLD   PANTOPRAZOLE (PROTONIX) 40 MG TABLET    Take 1 tablet (40 mg total) by mouth daily.   POTASSIUM CHLORIDE SA (K-DUR,KLOR-CON) 20 MEQ TABLET    Take 1 tablet (20 mEq total) by mouth 2 (two) times a week. Mon / Ludwig Clarks  RIVAROXABAN (XARELTO) 20 MG TABS TABLET    Take 1 tablet (20 mg total) by mouth daily with supper.   TORSEMIDE (DEMADEX) 20 MG TABLET    Take 2 tablets (40 mg total) by mouth 2 (two) times daily. Do not take on 10/21/15 & 10/22/15. Start taking on 10/23/15.  Modified Medications   No medications on file  Discontinued Medications   B-D ULTRAFINE III SHORT PEN 31G X 8 MM MISC    USE AS DIRECTED. THREE TIMES DAILY   HUMALOG KWIKPEN 100 UNIT/ML KIWKPEN    Inject 0.3-0.4 mLs (30-40 Units total) into the skin 3 (three) times daily. Inject 3 time daily just before each meal 40-30-40   PROAIR HFA 108 (90 BASE) MCG/ACT INHALER    INHALE 1 TO 2 PUFFS BY MOUTH FOUR TIMES DAILY AS DIRECTED AS NEEDED FOR WHEEZING AND SHORTNESS OF BREATH    Review of Systems  Constitutional: Negative for chills, activity change and fatigue.  HENT: Negative for sore throat and trouble swallowing.   Eyes: Negative for visual disturbance.  Respiratory: Negative for cough, chest tightness and shortness of breath.   Cardiovascular: Negative for chest pain, palpitations and leg swelling.  Gastrointestinal: Negative for nausea, vomiting, abdominal pain and blood in stool.  Genitourinary: Negative for urgency, frequency and difficulty urinating.  Musculoskeletal: Positive for arthralgias and gait problem.  Skin: Negative for rash.  Neurological: Positive for weakness. Negative for headaches.   Psychiatric/Behavioral: Negative for confusion and sleep disturbance. The patient is not nervous/anxious.     Filed Vitals:   11/21/15 1429  BP: 122/67  Pulse: 80  Temp: 98 F (36.7 C)  TempSrc: Oral  Resp: 18  Height: 6' (1.829 m)  Weight: 294 lb 6.4 oz (133.539 kg)  SpO2: 96%   Body mass index is 39.92 kg/(m^2).  Physical Exam  Constitutional: He is oriented to person, place, and time. He appears well-developed and well-nourished.  Sitting in w/c in NAD. He is not wearing [place] O2  HENT:  Mouth/Throat: Oropharynx is clear and moist.  Eyes: Pupils are equal, round, and reactive to light. No scleral icterus.  Neck: Neck supple. Carotid bruit is not present. No thyromegaly present.  Cardiovascular: Normal rate, regular rhythm and intact distal pulses.  Exam reveals no gallop and no friction rub.   Murmur (1/6 SEM) heard. Trace LE edema b/l. No calf TTP  Pulmonary/Chest: Effort normal and breath sounds normal. He has no wheezes. He has no rales. He exhibits no tenderness.  Abdominal: Soft. Bowel sounds are normal. He exhibits no distension, no abdominal bruit, no pulsatile midline mass and no mass. There is no tenderness. There is no rebound and no guarding.  Musculoskeletal: He exhibits edema and tenderness.  Lymphadenopathy:    He has no cervical adenopathy.  Neurological: He is alert and oriented to person, place, and time.  Skin: Skin is warm and dry. No rash noted.  Psychiatric: He has a normal mood and affect. His behavior is normal. Thought content normal.     Labs reviewed: Admission on 11/13/2015, Discharged on 11/16/2015  Component Date Value Ref Range Status  . WBC 11/13/2015 7.1  4.0 - 10.5 K/uL Final  . RBC 11/13/2015 3.22* 4.22 - 5.81 MIL/uL Final  . Hemoglobin 11/13/2015 9.0* 13.0 - 17.0 g/dL Final  . HCT 11/13/2015 27.9* 39.0 - 52.0 % Final  . MCV 11/13/2015 86.6  78.0 - 100.0 fL Final  . MCH 11/13/2015 28.0  26.0 - 34.0 pg Final  . MCHC 11/13/2015  32.3   30.0 - 36.0 g/dL Final  . RDW 11/13/2015 16.4* 11.5 - 15.5 % Final  . Platelets 11/13/2015 175  150 - 400 K/uL Final  . Neutrophils Relative % 11/13/2015 72   Final  . Neutro Abs 11/13/2015 5.1  1.7 - 7.7 K/uL Final  . Lymphocytes Relative 11/13/2015 11   Final  . Lymphs Abs 11/13/2015 0.8  0.7 - 4.0 K/uL Final  . Monocytes Relative 11/13/2015 15   Final  . Monocytes Absolute 11/13/2015 1.1* 0.1 - 1.0 K/uL Final  . Eosinophils Relative 11/13/2015 2   Final  . Eosinophils Absolute 11/13/2015 0.1  0.0 - 0.7 K/uL Final  . Basophils Relative 11/13/2015 0   Final  . Basophils Absolute 11/13/2015 0.0  0.0 - 0.1 K/uL Final  . Sodium 11/13/2015 136  135 - 145 mmol/L Final  . Potassium 11/13/2015 3.9  3.5 - 5.1 mmol/L Final  . Chloride 11/13/2015 94* 101 - 111 mmol/L Final  . CO2 11/13/2015 26  22 - 32 mmol/L Final  . Glucose, Bld 11/13/2015 100* 65 - 99 mg/dL Final  . BUN 11/13/2015 100* 6 - 20 mg/dL Final  . Creatinine, Ser 11/13/2015 3.42* 0.61 - 1.24 mg/dL Final  . Calcium 11/13/2015 9.4  8.9 - 10.3 mg/dL Final  . Total Protein 11/13/2015 6.2* 6.5 - 8.1 g/dL Final  . Albumin 11/13/2015 3.2* 3.5 - 5.0 g/dL Final  . AST 11/13/2015 18  15 - 41 U/L Final  . ALT 11/13/2015 9* 17 - 63 U/L Final  . Alkaline Phosphatase 11/13/2015 47  38 - 126 U/L Final  . Total Bilirubin 11/13/2015 0.4  0.3 - 1.2 mg/dL Final  . GFR calc non Af Amer 11/13/2015 17* >60 mL/min Final  . GFR calc Af Amer 11/13/2015 20* >60 mL/min Final   Comment: (NOTE) The eGFR has been calculated using the CKD EPI equation. This calculation has not been validated in all clinical situations. eGFR's persistently <60 mL/min signify possible Chronic Kidney Disease.   . Anion gap 11/13/2015 16* 5 - 15 Final  . Troponin i, poc 11/13/2015 0.04  0.00 - 0.08 ng/mL Final  . Comment 3 11/13/2015          Final   Comment: Due to the release kinetics of cTnI, a negative result within the first hours of the onset of symptoms does not rule  out myocardial infarction with certainty. If myocardial infarction is still suspected, repeat the test at appropriate intervals.   . Fecal Occult Bld 11/13/2015 POSITIVE* NEGATIVE Final  . Color, Urine 11/13/2015 YELLOW  YELLOW Final  . APPearance 11/13/2015 HAZY* CLEAR Final  . Specific Gravity, Urine 11/13/2015 1.009  1.005 - 1.030 Final  . pH 11/13/2015 5.5  5.0 - 8.0 Final  . Glucose, UA 11/13/2015 NEGATIVE  NEGATIVE mg/dL Final  . Hgb urine dipstick 11/13/2015 NEGATIVE  NEGATIVE Final  . Bilirubin Urine 11/13/2015 NEGATIVE  NEGATIVE Final  . Ketones, ur 11/13/2015 NEGATIVE  NEGATIVE mg/dL Final  . Protein, ur 11/13/2015 NEGATIVE  NEGATIVE mg/dL Final  . Nitrite 11/13/2015 NEGATIVE  NEGATIVE Final  . Leukocytes, UA 11/13/2015 NEGATIVE  NEGATIVE Final   MICROSCOPIC NOT DONE ON URINES WITH NEGATIVE PROTEIN, BLOOD, LEUKOCYTES, NITRITE, OR GLUCOSE <1000 mg/dL.  [place] Kitchen Sodium 11/14/2015 135  135 - 145 mmol/L Final  . Potassium 11/14/2015 4.3  3.5 - 5.1 mmol/L Final  . Chloride 11/14/2015 94* 101 - 111 mmol/L Final  . CO2 11/14/2015 28  22 - 32 mmol/L Final  .  Glucose, Bld 11/14/2015 181* 65 - 99 mg/dL Final  . BUN 11/14/2015 93* 6 - 20 mg/dL Final  . Creatinine, Ser 11/14/2015 2.77* 0.61 - 1.24 mg/dL Final  . Calcium 11/14/2015 8.8* 8.9 - 10.3 mg/dL Final  . GFR calc non Af Amer 11/14/2015 22* >60 mL/min Final  . GFR calc Af Amer 11/14/2015 25* >60 mL/min Final   Comment: (NOTE) The eGFR has been calculated using the CKD EPI equation. This calculation has not been validated in all clinical situations. eGFR's persistently <60 mL/min signify possible Chronic Kidney Disease.   . Anion gap 11/14/2015 13  5 - 15 Final  . Glucose-Capillary 11/13/2015 106* 65 - 99 mg/dL Final  . Glucose-Capillary 11/13/2015 169* 65 - 99 mg/dL Final  . Glucose-Capillary 11/14/2015 151* 65 - 99 mg/dL Final  . Comment 1 11/14/2015 Notify RN   Final  . Comment 2 11/14/2015 Document in Chart   Final  . WBC  11/14/2015 6.3  4.0 - 10.5 K/uL Final  . RBC 11/14/2015 3.46* 4.22 - 5.81 MIL/uL Final  . Hemoglobin 11/14/2015 9.3* 13.0 - 17.0 g/dL Final  . HCT 11/14/2015 29.9* 39.0 - 52.0 % Final  . MCV 11/14/2015 86.4  78.0 - 100.0 fL Final  . MCH 11/14/2015 26.9  26.0 - 34.0 pg Final  . MCHC 11/14/2015 31.1  30.0 - 36.0 g/dL Final  . RDW 11/14/2015 16.5* 11.5 - 15.5 % Final  . Platelets 11/14/2015 179  150 - 400 K/uL Final  . Vitamin B-12 11/14/2015 420  180 - 914 pg/mL Final   Comment: (NOTE) This assay is not validated for testing neonatal or myeloproliferative syndrome specimens for Vitamin B12 levels.   . Folate 11/14/2015 14.6  >5.9 ng/mL Final  . Iron 11/14/2015 38* 45 - 182 ug/dL Final  . TIBC 11/14/2015 392  250 - 450 ug/dL Final  . Saturation Ratios 11/14/2015 10* 17.9 - 39.5 % Final  . UIBC 11/14/2015 354   Final  . Ferritin 11/14/2015 26  24 - 336 ng/mL Final  . Retic Ct Pct 11/14/2015 1.3  0.4 - 3.1 % Final  . RBC. 11/14/2015 3.46* 4.22 - 5.81 MIL/uL Final  . Retic Count, Manual 11/14/2015 45.0  19.0 - 186.0 K/uL Final  . Glucose-Capillary 11/14/2015 266* 65 - 99 mg/dL Final  . Sodium 11/15/2015 132* 135 - 145 mmol/L Final  . Potassium 11/15/2015 4.1  3.5 - 5.1 mmol/L Final  . Chloride 11/15/2015 94* 101 - 111 mmol/L Final  . CO2 11/15/2015 26  22 - 32 mmol/L Final  . Glucose, Bld 11/15/2015 264* 65 - 99 mg/dL Final  . BUN 11/15/2015 86* 6 - 20 mg/dL Final  . Creatinine, Ser 11/15/2015 2.57* 0.61 - 1.24 mg/dL Final  . Calcium 11/15/2015 8.5* 8.9 - 10.3 mg/dL Final  . GFR calc non Af Amer 11/15/2015 24* >60 mL/min Final  . GFR calc Af Amer 11/15/2015 28* >60 mL/min Final   Comment: (NOTE) The eGFR has been calculated using the CKD EPI equation. This calculation has not been validated in all clinical situations. eGFR's persistently <60 mL/min signify possible Chronic Kidney Disease.   . Anion gap 11/15/2015 12  5 - 15 Final  . WBC 11/15/2015 5.0  4.0 - 10.5 K/uL Final  . RBC  11/15/2015 3.05* 4.22 - 5.81 MIL/uL Final  . Hemoglobin 11/15/2015 8.4* 13.0 - 17.0 g/dL Final  . HCT 11/15/2015 26.5* 39.0 - 52.0 % Final  . MCV 11/15/2015 86.9  78.0 - 100.0 fL Final  .  Pine 11/15/2015 27.5  26.0 - 34.0 pg Final  . MCHC 11/15/2015 31.7  30.0 - 36.0 g/dL Final  . RDW 11/15/2015 16.7* 11.5 - 15.5 % Final  . Platelets 11/15/2015 159  150 - 400 K/uL Final  . Glucose-Capillary 11/14/2015 284* 65 - 99 mg/dL Final  . Glucose-Capillary 11/14/2015 325* 65 - 99 mg/dL Final  . Comment 1 11/14/2015 Notify RN   Final  . Comment 2 11/14/2015 Document in Chart   Final  . Glucose-Capillary 11/15/2015 244* 65 - 99 mg/dL Final  . Comment 1 11/15/2015 Notify RN   Final  . Comment 2 11/15/2015 Document in Chart   Final  . TSH 11/15/2015 18.746* 0.350 - 4.500 uIU/mL Final  . Cortisol, Plasma 11/15/2015 8.3   Final   Comment: (NOTE) AM    6.7 - 22.6 ug/dL PM   <10.0       ug/dL   . Glucose-Capillary 11/15/2015 221* 65 - 99 mg/dL Final  . Sodium 11/16/2015 131* 135 - 145 mmol/L Final  . Potassium 11/16/2015 4.5  3.5 - 5.1 mmol/L Final  . Chloride 11/16/2015 97* 101 - 111 mmol/L Final  . CO2 11/16/2015 24  22 - 32 mmol/L Final  . Glucose, Bld 11/16/2015 255* 65 - 99 mg/dL Final  . BUN 11/16/2015 79* 6 - 20 mg/dL Final  . Creatinine, Ser 11/16/2015 2.35* 0.61 - 1.24 mg/dL Final  . Calcium 11/16/2015 8.5* 8.9 - 10.3 mg/dL Final  . GFR calc non Af Amer 11/16/2015 27* >60 mL/min Final  . GFR calc Af Amer 11/16/2015 31* >60 mL/min Final   Comment: (NOTE) The eGFR has been calculated using the CKD EPI equation. This calculation has not been validated in all clinical situations. eGFR's persistently <60 mL/min signify possible Chronic Kidney Disease.   . Anion gap 11/16/2015 10  5 - 15 Final  . Glucose-Capillary 11/15/2015 296* 65 - 99 mg/dL Final  . WBC 11/16/2015 5.9  4.0 - 10.5 K/uL Final  . RBC 11/16/2015 3.13* 4.22 - 5.81 MIL/uL Final  . Hemoglobin 11/16/2015 8.8* 13.0 - 17.0 g/dL  Final  . HCT 11/16/2015 26.7* 39.0 - 52.0 % Final  . MCV 11/16/2015 85.3  78.0 - 100.0 fL Final  . MCH 11/16/2015 28.1  26.0 - 34.0 pg Final  . MCHC 11/16/2015 33.0  30.0 - 36.0 g/dL Final  . RDW 11/16/2015 16.4* 11.5 - 15.5 % Final  . Platelets 11/16/2015 143* 150 - 400 K/uL Final  . Neutrophils Relative % 11/16/2015 55   Final  . Neutro Abs 11/16/2015 3.2  1.7 - 7.7 K/uL Final  . Lymphocytes Relative 11/16/2015 22   Final  . Lymphs Abs 11/16/2015 1.3  0.7 - 4.0 K/uL Final  . Monocytes Relative 11/16/2015 19   Final  . Monocytes Absolute 11/16/2015 1.1* 0.1 - 1.0 K/uL Final  . Eosinophils Relative 11/16/2015 5   Final  . Eosinophils Absolute 11/16/2015 0.3  0.0 - 0.7 K/uL Final  . Basophils Relative 11/16/2015 1   Final  . Basophils Absolute 11/16/2015 0.0  0.0 - 0.1 K/uL Final  . Glucose-Capillary 11/15/2015 380* 65 - 99 mg/dL Final  . Comment 1 11/15/2015 Notify RN   Final  . Comment 2 11/15/2015 Document in Chart   Final  . Glucose-Capillary 11/16/2015 250* 65 - 99 mg/dL Final  . Comment 1 11/16/2015 Notify RN   Final  . Comment 2 11/16/2015 Document in Chart   Final  . Glucose-Capillary 11/16/2015 254* 65 - 99 mg/dL Final  Admission on 10/18/2015, Discharged on 10/21/2015  Component Date Value Ref Range Status  . WBC 10/18/2015 7.3  4.0 - 10.5 K/uL Final  . RBC 10/18/2015 3.84* 4.22 - 5.81 MIL/uL Final  . Hemoglobin 10/18/2015 10.2* 13.0 - 17.0 g/dL Final  . HCT 10/18/2015 33.7* 39.0 - 52.0 % Final  . MCV 10/18/2015 87.8  78.0 - 100.0 fL Final  . MCH 10/18/2015 26.6  26.0 - 34.0 pg Final  . MCHC 10/18/2015 30.3  30.0 - 36.0 g/dL Final  . RDW 10/18/2015 16.0* 11.5 - 15.5 % Final  . Platelets 10/18/2015 197  150 - 400 K/uL Final  . Neutrophils Relative % 10/18/2015 69   Final  . Neutro Abs 10/18/2015 5.1  1.7 - 7.7 K/uL Final  . Lymphocytes Relative 10/18/2015 16   Final  . Lymphs Abs 10/18/2015 1.1  0.7 - 4.0 K/uL Final  . Monocytes Relative 10/18/2015 11   Final  .  Monocytes Absolute 10/18/2015 0.8  0.1 - 1.0 K/uL Final  . Eosinophils Relative 10/18/2015 4   Final  . Eosinophils Absolute 10/18/2015 0.3  0.0 - 0.7 K/uL Final  . Basophils Relative 10/18/2015 0   Final  . Basophils Absolute 10/18/2015 0.0  0.0 - 0.1 K/uL Final  . Sodium 10/18/2015 135  135 - 145 mmol/L Final  . Potassium 10/18/2015 3.8  3.5 - 5.1 mmol/L Final  . Chloride 10/18/2015 91* 101 - 111 mmol/L Final  . CO2 10/18/2015 31  22 - 32 mmol/L Final  . Glucose, Bld 10/18/2015 216* 65 - 99 mg/dL Final  . BUN 10/18/2015 79* 6 - 20 mg/dL Final  . Creatinine, Ser 10/18/2015 2.99* 0.61 - 1.24 mg/dL Final  . Calcium 10/18/2015 9.6  8.9 - 10.3 mg/dL Final  . GFR calc non Af Amer 10/18/2015 20* >60 mL/min Final  . GFR calc Af Amer 10/18/2015 23* >60 mL/min Final   Comment: (NOTE) The eGFR has been calculated using the CKD EPI equation. This calculation has not been validated in all clinical situations. eGFR's persistently <60 mL/min signify possible Chronic Kidney Disease.   . Anion gap 10/18/2015 13  5 - 15 Final  . Glucose-Capillary 10/18/2015 188* 65 - 99 mg/dL Final  . Comment 1 10/18/2015 Notify RN   Final  . MRSA by PCR 10/18/2015 NEGATIVE  NEGATIVE Final   Comment:        The GeneXpert MRSA Assay (FDA approved for NASAL specimens only), is one component of a comprehensive MRSA colonization surveillance program. It is not intended to diagnose MRSA infection nor to guide or monitor treatment for MRSA infections.   . Sodium 10/19/2015 135  135 - 145 mmol/L Final  . Potassium 10/19/2015 3.6  3.5 - 5.1 mmol/L Final  . Chloride 10/19/2015 92* 101 - 111 mmol/L Final  . CO2 10/19/2015 34* 22 - 32 mmol/L Final  . Glucose, Bld 10/19/2015 170* 65 - 99 mg/dL Final  . BUN 10/19/2015 76* 6 - 20 mg/dL Final  . Creatinine, Ser 10/19/2015 2.56* 0.61 - 1.24 mg/dL Final  . Calcium 10/19/2015 9.3  8.9 - 10.3 mg/dL Final  . Total Protein 10/19/2015 7.1  6.5 - 8.1 g/dL Final  . Albumin  10/19/2015 3.3* 3.5 - 5.0 g/dL Final  . AST 10/19/2015 15  15 - 41 U/L Final  . ALT 10/19/2015 11* 17 - 63 U/L Final  . Alkaline Phosphatase 10/19/2015 57  38 - 126 U/L Final  . Total Bilirubin 10/19/2015 0.8  0.3 - 1.2 mg/dL Final  .  GFR calc non Af Amer 10/19/2015 24* >60 mL/min Final  . GFR calc Af Amer 10/19/2015 28* >60 mL/min Final   Comment: (NOTE) The eGFR has been calculated using the CKD EPI equation. This calculation has not been validated in all clinical situations. eGFR's persistently <60 mL/min signify possible Chronic Kidney Disease.   . Anion gap 10/19/2015 9  5 - 15 Final  . WBC 10/19/2015 7.2  4.0 - 10.5 K/uL Final  . RBC 10/19/2015 3.63* 4.22 - 5.81 MIL/uL Final  . Hemoglobin 10/19/2015 9.8* 13.0 - 17.0 g/dL Final  . HCT 10/19/2015 31.9* 39.0 - 52.0 % Final  . MCV 10/19/2015 87.9  78.0 - 100.0 fL Final  . MCH 10/19/2015 27.0  26.0 - 34.0 pg Final  . MCHC 10/19/2015 30.7  30.0 - 36.0 g/dL Final  . RDW 10/19/2015 15.5  11.5 - 15.5 % Final  . Platelets 10/19/2015 185  150 - 400 K/uL Final  . Neutrophils Relative % 10/19/2015 65   Final  . Neutro Abs 10/19/2015 4.7  1.7 - 7.7 K/uL Final  . Lymphocytes Relative 10/19/2015 18   Final  . Lymphs Abs 10/19/2015 1.3  0.7 - 4.0 K/uL Final  . Monocytes Relative 10/19/2015 10   Final  . Monocytes Absolute 10/19/2015 0.8  0.1 - 1.0 K/uL Final  . Eosinophils Relative 10/19/2015 7   Final  . Eosinophils Absolute 10/19/2015 0.5  0.0 - 0.7 K/uL Final  . Basophils Relative 10/19/2015 0   Final  . Basophils Absolute 10/19/2015 0.0  0.0 - 0.1 K/uL Final  . Troponin I 10/19/2015 0.03  <0.031 ng/mL Final   Comment:        NO INDICATION OF MYOCARDIAL INJURY.   . Troponin I 10/19/2015 0.03  <0.031 ng/mL Final   Comment:        NO INDICATION OF MYOCARDIAL INJURY.   . Troponin I 10/19/2015 0.03  <0.031 ng/mL Final   Comment:        NO INDICATION OF MYOCARDIAL INJURY.   . Glucose-Capillary 10/19/2015 153* 65 - 99 mg/dL Final   . Glucose-Capillary 10/19/2015 170* 65 - 99 mg/dL Final  . Comment 1 10/19/2015 Notify RN   Final  . Comment 2 10/19/2015 Document in Chart   Final  . Glucose-Capillary 10/19/2015 252* 65 - 99 mg/dL Final  . Sodium 10/20/2015 134* 135 - 145 mmol/L Final  . Potassium 10/20/2015 3.8  3.5 - 5.1 mmol/L Final  . Chloride 10/20/2015 92* 101 - 111 mmol/L Final  . CO2 10/20/2015 30  22 - 32 mmol/L Final  . Glucose, Bld 10/20/2015 170* 65 - 99 mg/dL Final  . BUN 10/20/2015 85* 6 - 20 mg/dL Final  . Creatinine, Ser 10/20/2015 2.29* 0.61 - 1.24 mg/dL Final  . Calcium 10/20/2015 9.0  8.9 - 10.3 mg/dL Final  . GFR calc non Af Amer 10/20/2015 27* >60 mL/min Final  . GFR calc Af Amer 10/20/2015 32* >60 mL/min Final   Comment: (NOTE) The eGFR has been calculated using the CKD EPI equation. This calculation has not been validated in all clinical situations. eGFR's persistently <60 mL/min signify possible Chronic Kidney Disease.   . Anion gap 10/20/2015 12  5 - 15 Final  . Glucose-Capillary 10/19/2015 300* 65 - 99 mg/dL Final  . Glucose-Capillary 10/19/2015 161* 65 - 99 mg/dL Final  . Glucose-Capillary 10/20/2015 163* 65 - 99 mg/dL Final  . Comment 1 10/20/2015 Document in Chart   Final  . Glucose-Capillary 10/20/2015 195* 65 - 99  mg/dL Final  . Comment 1 10/20/2015 Document in Chart   Final  . Sodium 10/21/2015 128* 135 - 145 mmol/L Final  . Potassium 10/21/2015 3.9  3.5 - 5.1 mmol/L Final  . Chloride 10/21/2015 89* 101 - 111 mmol/L Final  . CO2 10/21/2015 30  22 - 32 mmol/L Final  . Glucose, Bld 10/21/2015 183* 65 - 99 mg/dL Final  . BUN 10/21/2015 87* 6 - 20 mg/dL Final  . Creatinine, Ser 10/21/2015 1.91* 0.61 - 1.24 mg/dL Final  . Calcium 10/21/2015 8.9  8.9 - 10.3 mg/dL Final  . GFR calc non Af Amer 10/21/2015 34* >60 mL/min Final  . GFR calc Af Amer 10/21/2015 40* >60 mL/min Final   Comment: (NOTE) The eGFR has been calculated using the CKD EPI equation. This calculation has not been  validated in all clinical situations. eGFR's persistently <60 mL/min signify possible Chronic Kidney Disease.   . Anion gap 10/21/2015 9  5 - 15 Final  . Glucose-Capillary 10/20/2015 370* 65 - 99 mg/dL Final  . Comment 1 10/20/2015 Document in Chart   Final  . Glucose-Capillary 10/20/2015 209* 65 - 99 mg/dL Final  . Glucose-Capillary 10/21/2015 153* 65 - 99 mg/dL Final  . Glucose-Capillary 10/21/2015 276* 65 - 99 mg/dL Final  . Glucose-Capillary 10/21/2015 161* 65 - 99 mg/dL Final  Admission on 09/13/2015, Discharged on 09/22/2015  No results displayed because visit has over 200 results.      Dg Chest 1 View  11/13/2015  CLINICAL DATA:  Hypotension this am EXAM: CHEST  1 VIEW COMPARISON:  10/18/2015 FINDINGS: Lungs are clear. Heart size and mediastinal contours are within normal limits. Left diaphragmatic leaflet is obscured as before. No effusion.  No pneumothorax. Visualized skeletal structures are unremarkable. IMPRESSION: No acute cardiopulmonary disease. Electronically Signed   By: Lucrezia Europe M.D.   On: 11/13/2015 14:21     Assessment/Plan   ICD-9-CM ICD-10-CM   1. Physical deconditioning 799.3 R53.81   2. Hypothyroidism due to acquired atrophy of thyroid 244.8 E03.8    246.8 E03.4   3. Chronic diastolic CHF (congestive heart failure) (HCC) 428.32 I50.32    428.0    4. Type 2 diabetes mellitus with diabetic nephropathy, with long-term current use of insulin (HCC) 250.40 E11.21    583.81 Z79.4    V58.67    5. Anemia, iron deficiency 280.9 D50.9   6. CKD (chronic kidney disease), stage III 585.3 N18.3   7. Dyslipidemia 272.4 E78.5   8. Chronic pain 338.29 G89.29   9. Atrial fibrillation with slow ventricular response (HCC) 427.31 I48.91     Please administer insulin 30 minutes prior to meals as ordered  Cont current meds as ordered  Repeat TSH in 3 weeks  [place] O2 as ordered  PT/OT/ST as ordered  F/u with specialist as scheduled  GOAL: short term rehab and d/c home  when medically appropriate. Communicated with pt and nursing.  Will follow  Emersyn Wyss S. Perlie Gold  [facility] [address] [place], [place] [address] [phone] Cell (Monday-Friday 8 AM - 5 PM) [phone] After 5 PM and follow prompts

## 2015-11-22 ENCOUNTER — Ambulatory Visit (HOSPITAL_COMMUNITY)
Admission: RE | Admit: 2015-11-22 | Discharge: 2015-11-22 | Disposition: A | Discharge status: Home / Self Care | Payer: Medicare Other | Source: Ambulatory Visit | Attending: Cardiology | Admitting: Cardiology

## 2015-11-22 ENCOUNTER — Encounter (HOSPITAL_COMMUNITY): Payer: Self-pay

## 2015-11-22 VITALS — BP 108/66 | HR 73 | Wt 245.5 lb

## 2015-11-22 DIAGNOSIS — E1165 Type 2 diabetes mellitus with hyperglycemia: Secondary | ICD-10-CM | POA: Diagnosis not present

## 2015-11-22 DIAGNOSIS — N184 Chronic kidney disease, stage 4 (severe): Secondary | ICD-10-CM

## 2015-11-22 DIAGNOSIS — Z96652 Presence of left artificial knee joint: Secondary | ICD-10-CM | POA: Insufficient documentation

## 2015-11-22 DIAGNOSIS — I4891 Unspecified atrial fibrillation: Secondary | ICD-10-CM

## 2015-11-22 DIAGNOSIS — J069 Acute upper respiratory infection, unspecified: Secondary | ICD-10-CM | POA: Insufficient documentation

## 2015-11-22 DIAGNOSIS — Z794 Long term (current) use of insulin: Secondary | ICD-10-CM | POA: Diagnosis not present

## 2015-11-22 DIAGNOSIS — I130 Hypertensive heart and chronic kidney disease with heart failure and stage 1 through stage 4 chronic kidney disease, or unspecified chronic kidney disease: Secondary | ICD-10-CM | POA: Diagnosis not present

## 2015-11-22 DIAGNOSIS — N183 Chronic kidney disease, stage 3 (moderate): Secondary | ICD-10-CM | POA: Diagnosis not present

## 2015-11-22 DIAGNOSIS — Z8249 Family history of ischemic heart disease and other diseases of the circulatory system: Secondary | ICD-10-CM | POA: Insufficient documentation

## 2015-11-22 DIAGNOSIS — E1122 Type 2 diabetes mellitus with diabetic chronic kidney disease: Secondary | ICD-10-CM | POA: Insufficient documentation

## 2015-11-22 DIAGNOSIS — Z79899 Other long term (current) drug therapy: Secondary | ICD-10-CM | POA: Insufficient documentation

## 2015-11-22 DIAGNOSIS — Z7902 Long term (current) use of antithrombotics/antiplatelets: Secondary | ICD-10-CM | POA: Insufficient documentation

## 2015-11-22 DIAGNOSIS — E039 Hypothyroidism, unspecified: Secondary | ICD-10-CM | POA: Insufficient documentation

## 2015-11-22 DIAGNOSIS — I2510 Atherosclerotic heart disease of native coronary artery without angina pectoris: Secondary | ICD-10-CM | POA: Insufficient documentation

## 2015-11-22 DIAGNOSIS — G4733 Obstructive sleep apnea (adult) (pediatric): Secondary | ICD-10-CM | POA: Diagnosis not present

## 2015-11-22 DIAGNOSIS — D649 Anemia, unspecified: Secondary | ICD-10-CM | POA: Insufficient documentation

## 2015-11-22 DIAGNOSIS — I5032 Chronic diastolic (congestive) heart failure: Secondary | ICD-10-CM | POA: Diagnosis present

## 2015-11-22 DIAGNOSIS — I481 Persistent atrial fibrillation: Secondary | ICD-10-CM | POA: Diagnosis not present

## 2015-11-22 DIAGNOSIS — I4892 Unspecified atrial flutter: Secondary | ICD-10-CM

## 2015-11-22 DIAGNOSIS — K922 Gastrointestinal hemorrhage, unspecified: Secondary | ICD-10-CM

## 2015-11-22 DIAGNOSIS — I739 Peripheral vascular disease, unspecified: Secondary | ICD-10-CM | POA: Diagnosis not present

## 2015-11-22 LAB — BASIC METABOLIC PANEL
Anion gap: 14 (ref 5–15)
BUN: 63 mg/dL — ABNORMAL HIGH (ref 6–20)
BUN: 61 mg/dL — AB (ref 4–21)
CALCIUM: 9.7 mg/dL (ref 8.9–10.3)
CO2: 27 mmol/L (ref 22–32)
CREATININE: 2.17 mg/dL — AB (ref 0.61–1.24)
CREATININE: 2 mg/dL — AB (ref 0.6–1.3)
Chloride: 93 mmol/L — ABNORMAL LOW (ref 101–111)
GFR, EST AFRICAN AMERICAN: 34 mL/min — AB (ref >60)
GFR, EST NON AFRICAN AMERICAN: 29 mL/min — AB (ref >60)
Glucose, Bld: 258 mg/dL — ABNORMAL HIGH (ref 65–99)
Glucose: 160 mg/dL
POTASSIUM: 3.8 mmol/L (ref 3.4–5.3)
Potassium: 3.4 mmol/L — ABNORMAL LOW (ref 3.5–5.1)
SODIUM: 134 mmol/L — AB (ref 135–145)
SODIUM: 136 mmol/L — AB (ref 137–147)

## 2015-11-22 LAB — LIPID PANEL
CHOLESTEROL: 130 mg/dL (ref 0–200)
HDL: 57 mg/dL (ref >40)
LDL Cholesterol: 61 mg/dL (ref 0–99)
Total CHOL/HDL Ratio: 2.3 RATIO
Triglycerides: 61 mg/dL (ref <150)
VLDL: 12 mg/dL (ref 0–40)

## 2015-11-22 LAB — CBC
HCT: 33.5 % — ABNORMAL LOW (ref 39.0–52.0)
HEMOGLOBIN: 11 g/dL — AB (ref 13.0–17.0)
MCH: 28 pg (ref 26.0–34.0)
MCHC: 32.8 g/dL (ref 30.0–36.0)
MCV: 85.2 fL (ref 78.0–100.0)
Platelets: 151 10*3/uL (ref 150–400)
RBC: 3.93 MIL/uL — AB (ref 4.22–5.81)
RDW: 16.5 % — ABNORMAL HIGH (ref 11.5–15.5)
WBC: 8.7 10*3/uL (ref 4.0–10.5)

## 2015-11-22 LAB — TSH: TSH: 9.65 u[IU]/mL — AB (ref 0.41–5.90)

## 2015-11-22 LAB — BRAIN NATRIURETIC PEPTIDE: B NATRIURETIC PEPTIDE 5: 759.3 pg/mL — AB (ref 0.0–100.0)

## 2015-11-22 MED ORDER — DOXYCYCLINE HYCLATE 100 MG PO CAPS: 100.0000 mg | ORAL_CAPSULE | Freq: Two times a day (BID) | ORAL | Status: DC

## 2015-11-22 NOTE — Progress Notes (Signed)
Patient ID: Mark Roberson, male   DOB: 05-26-1946, 70 y.o.   MRN: [id number]   PCP: Dr. Genella Mech HF Cardiologist: Dr Shirlee Latch.   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  He had PCI back in the 1990s, no cath since the '90s.  He had a successful TEE-guided DCCV for atrial fibrillation in [place] in 2/16.  Later in 2/16, he moved to [facility].  He was admitted to [facility] with acute on chronic diastolic CHF.  He was back in atrial fibrillation.  He was admitted again in 5/16 with dyspnea, weakness, and edema.  He was diuresed. He had epistaxis and Xarelto was briefly held.  He had tooth abscesses treated.  DCCV in 7/16 led to brief NSR but he reverted to atrial fibrillation.   He was admitted in 8/16 with acute on chronic diastolic CHF and diuresed well with IV Lasix.  HR was low, Coreg was stopped. After this, he ended up being over-diuresed and diuretics were cut back.   Admitted 10/18/15 with A/C CKD. Diuretics adjusted and he was discharged off torsemide for 2 days then restart on 1/30---> torsemide 60 mg in am and 40 mg in pm. Discharge weight was 303 pounds.   He was admitted again in 2/17 with AKI and hypotension. He was dehydrated/overdiuresed, had continued to take metolazone after we had wanted him to stop it. While in the hospital, he was found to be hypothyroid and started on Levoxyl. He was FOBT+ but no overt bleeding.  Xarelto was continued.  He was sent home on torsemide 40 mg bid.   He is now back at [facility] getting rehab.  He is walking more.  Occasional mild lightheadedness with standing.  He has had congestion and coughing recently, no fever.  He is not getting short of breath with short walks.  Weight is down a lot. No BRBPR or melena.  His distal left foot is purplish in color.  He denies foot/leg pain with ambulation.   Labs (2/16): LDL 64 Labs (5/16): K 4.5, creatinine 1.81, Hgb  8.8 Labs (6/16): K 4.1 Creatinine 1.32 , HCT 28.6, BNP 223 Labs (7/16): K 3.3, Creatinine 1.84, HCT 26 Labs (8/16): K 3.8, creatinine 1.64 => 2.18, HCT 28.9, TSH 12 Labs (9/16): K 3.5, creatinine 1.85, BNP 214 Labs (10/21/2015): K 3.9 Creatinine 1.91  Labs (2/17): K 4.5, creatinine 2.35, hgb 8.8  PMH: 1. CKD: Suspect diabetic nephropathy plays a role.  2. Diabetes 3. OSA: Severe on 5/16 sleep study.  Unable to tolerate CPAP.  4. Chronic diastolic CHF: Echo (2/16) with EF 60-65%, mild MR, moderately dilated RV with normal systolic function, PA systolic pressure 50 mmHg. Echo (12/16) with EF 65-70%, technically difficult. 5. Atrial fibrillation: Now persistent.  He had successful TEE-guided DCCV in 2/16 in [place] but atrial fibrillation recurred not long after. DCCV 7/16 on amiodarone => atrial fibrillation quickly recurred.  6. HTN 7. Hypothyroidism.  8. CAD: s/p PCI with DES in the 1990s.  No cardiac cath since 1990s.  9. Anemia 10. 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 12. Bradycardia 13. PAD: 2/17 peripheral arterial dopplers with bilateral 30-49% SFA stenosis, significant bilateral tibial disease, mild decreased ABI on left.   SH: Living with friends in [place], originally from [place] (moved to [place] in 2016).  Nonsmoker.  No ETOH.   FH: CAD  ROS: All systems reviewed and negative except as per HPI.  Current Outpatient Prescriptions  Medication Sig Dispense Refill  . acetaminophen (TYLENOL) 500 MG tablet Take 500 mg by mouth every 4 (four) hours as needed (pain). Do not exceed 4 gms of tylenol in 24 hours    . atorvastatin (LIPITOR) 20 MG tablet Take 20 mg by mouth at bedtime. For HLD    . cholecalciferol (VITAMIN D) 1000 UNITS tablet Take 1,000 Units by mouth daily. for bone health    . gabapentin (NEURONTIN) 300 MG capsule Take 300 mg by mouth 2 (two) times daily. For diabetic neuropathy    . Insulin Glargine (LANTUS SOLOSTAR) 100 UNIT/ML Solostar Pen Inject 40 Units into the skin at  bedtime.     . insulin lispro (HUMALOG KWIKPEN) 100 UNIT/ML KiwkPen Inject as per sliding scale: if 0-149=0 units; 150+ 150 or greater 5 units, subcutaneously with meals realted to type 2 diabetic mellitus with other diabetic kidney complication    . levothyroxine (SYNTHROID) 75 MCG tablet Take 1 tablet (75 mcg total) by mouth daily before breakfast.    . Magnesium 400 MG CAPS Take 400 mg by mouth daily.    . niacin 250 MG tablet Take 250 mg by mouth daily.    . Omega-3 Fatty Acids (FISH OIL) 1000 MG CAPS Take 2,000 mg by mouth daily. For HLD    . pantoprazole (PROTONIX) 40 MG tablet Take 1 tablet (40 mg total) by mouth daily.    . potassium chloride SA (K-DUR,KLOR-CON) 20 MEQ tablet Take 1 tablet (20 mEq total) by mouth 2 (two) times a week. Mon / Fri    . rivaroxaban (XARELTO) 20 MG TABS tablet Take 1 tablet (20 mg total) by mouth daily with supper. 30 tablet 6  . torsemide (DEMADEX) 20 MG tablet Take 2 tablets (40 mg total) by mouth 2 (two) times daily. Do not take on 10/21/15 & 10/22/15. Start taking on 10/23/15.    Marland Kitchen albuterol (PROVENTIL HFA;VENTOLIN HFA) 108 (90 Base) MCG/ACT inhaler Inhale 2 puffs into the lungs every 6 (six) hours as needed for wheezing or shortness of breath. Reported on 11/22/2015    . albuterol (PROVENTIL) (2.5 MG/3ML) 0.083% nebulizer solution Take 3 mLs (2.5 mg total) by nebulization every 6 (six) hours as needed for wheezing or shortness of breath. (Patient not taking: Reported on 11/22/2015) 75 mL 1  . doxycycline (VIBRAMYCIN) 100 MG capsule Take 1 capsule (100 mg total) by mouth 2 (two) times daily.     No current facility-administered medications for this encounter.   BP 108/66 mmHg  Pulse 73  Wt 245 lb 8 oz (111.358 kg)  SpO2 97% General: NAD, obese. Arrived in wheelchair. Friend with him.  Neck: Thick, no JVD, no thyromegaly or thyroid nodule.  Lungs: Slight crackles at bases bilaterally.  CV: Nondisplaced PMI.  Irregular S1/S2, no S3/S4, 1/6 SEM RUSB.  No edema.   No carotid bruit.  Unable to palpate pedal pulses and purplish hue to distal left foot.   Abdomen: Soft, nontender, no hepatosplenomegaly, no distention.  Skin: Intact without lesions or rashes.  Neurologic: Alert and oriented x 3.  Psych: Normal affect. HEENT: Normal.   Assessment/Plan: 1. Chronic diastolic CHF: EF 81-19% on last echo with RV enlargement.  PA pressure elevated by echo in 4/16.  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  NYHA III chronically.  Volume status  looks ok, cut back on torsemide with recent admission with AKI and dehydration. Weight is down considerably. - Stay off metolazone. - Continue torsemide 40 mg bid. BMET today.  2. PAD: No pain, but distal left foot is mild purplish in color.  Does not seem cooler than his other foot. He says it has been like this for several weeks now.  Peripheral arterial doppler study in 2/17 suggested bilateral PAD, worse on left, with significant bilateral tibial disease.  I would like him to be evaluated in [facility] clinic, will make referral.  3. Atrial fibrillation: This has been persistent, probably since 2/16.  He has remained in atrial fibrillation despite DCCV attempt 7/16 on amiodarone.  Off Amiodarone and Coreg due to bradycardia. - Will check BMET today and reassess Xarelto dosing (15 mg daily versus 20 mg daily).  4. OSA/suspected OHS: Oxygen level has been better during the day (probably with weight loss) and he is not using oxygen.  He has severe OSA but has not been able to tolerate CPAP. 5. CAD: Stable, no chest pain.  He is on Xarelto with stable CAD so not on aspirin.  Continue statin, check lipids today.  6. CKD Stage III-IV: BMET today.  7. Uncontrolled DM:  Endocrinology following per pt. 8. GI bleeding: FOBT+ in recent hospitalization with anemia  but no overt bleeding.  He remains on Xarelto for atrial fibrillation.  I am going to refer him to GI for evaluation and repeat CBC today. 9. URI: Suspect acute bronchitis, will give him course of doxycycline 100 bid x 7 days.   Followup in 1 month.   Marca Ancona 11/22/2015

## 2015-11-22 NOTE — Patient Instructions (Signed)
Doxycycline 100 mg Twice daily for 1 week  Labs today  You have been referred to GI  You have been referred to Dr Kirke Corin   Your physician recommends that you schedule a follow-up appointment in: 1 month

## 2015-11-23 ENCOUNTER — Ambulatory Visit (INDEPENDENT_AMBULATORY_CARE_PROVIDER_SITE_OTHER): Payer: Medicare Other | Admitting: Internal Medicine

## 2015-11-23 ENCOUNTER — Encounter: Payer: Self-pay | Admitting: Internal Medicine

## 2015-11-23 VITALS — BP 122/52 | HR 70 | Temp 98.9°F | Resp 18 | Ht 72.0 in | Wt 296.0 lb

## 2015-11-23 DIAGNOSIS — Z6841 Body Mass Index (BMI) 40.0 and over, adult: Secondary | ICD-10-CM

## 2015-11-23 DIAGNOSIS — I5032 Chronic diastolic (congestive) heart failure: Secondary | ICD-10-CM | POA: Diagnosis not present

## 2015-11-23 DIAGNOSIS — D62 Acute posthemorrhagic anemia: Secondary | ICD-10-CM | POA: Diagnosis not present

## 2015-11-23 DIAGNOSIS — R5381 Other malaise: Secondary | ICD-10-CM

## 2015-11-23 MED ORDER — ATORVASTATIN CALCIUM 20 MG PO TABS: 20.0000 mg | ORAL_TABLET | Freq: Every day | ORAL | Status: AC

## 2015-11-23 MED ORDER — GABAPENTIN 300 MG PO CAPS: 300.0000 mg | ORAL_CAPSULE | Freq: Two times a day (BID) | ORAL | Status: AC

## 2015-11-23 MED ORDER — ALBUTEROL SULFATE (2.5 MG/3ML) 0.083% IN NEBU: 2.5000 mg | INHALATION_SOLUTION | Freq: Four times a day (QID) | RESPIRATORY_TRACT | Status: AC | PRN

## 2015-11-23 MED ORDER — INSULIN GLARGINE 100 UNIT/ML SOLOSTAR PEN
40.0000 [IU] | PEN_INJECTOR | Freq: Every day | SUBCUTANEOUS | Status: AC
Start: 2015-11-23 — End: ?

## 2015-11-23 NOTE — Progress Notes (Signed)
Pre visit review using our clinic review tool, if applicable. No additional management support is needed unless otherwise documented below in the visit note. 

## 2015-11-23 NOTE — Patient Instructions (Signed)
We have sent in the refills today.   We do not need any blood work today.   Come back in about 3-4 months for a check in on your progress.

## 2015-11-24 ENCOUNTER — Encounter: Payer: Self-pay | Admitting: Internal Medicine

## 2015-11-24 NOTE — Assessment & Plan Note (Signed)
Still needs to see GI for FOBT positive stools. Most recent CBC improving.

## 2015-11-24 NOTE — Assessment & Plan Note (Signed)
Weight is improving with the diet at the rehab facility. He has had problems with diet when he is on his own.

## 2015-11-24 NOTE — Assessment & Plan Note (Signed)
At rehab facility and doing PT to improve his strength. In wheelchair today for longer distances but using walker at the facility.

## 2015-11-24 NOTE — Assessment & Plan Note (Signed)
No exacerbation today. Stable on his torsemide dosing and on lipitor. BP running low in the hospital so not on any additional blood pressure agents.

## 2015-11-24 NOTE — Progress Notes (Signed)
   Subjective:    Patient ID: Mark Roberson, male    DOB: January 11, 1946, 70 y.o.   MRN: [id number]  HPI The patient is a 70 YO man coming in for hospital follow up (in with AKI and dehydration from inappropriate medication usage, was taking additional diuretics, given fluids and renal function improved slightly now ckd stage 4). He is doing fine since discharge. He has gone to a SNF for more rehab and therapy. He is not requiring oxygen at this time. No additional SOB or cough. No chest pains. Some leg swelling intermittently. Feels like his weights are continuing to go down. He is not happy with the food there as they are not giving him any salt.   Review of Systems  Constitutional: Negative for fever, chills, appetite change and fatigue.  HENT: Negative.   Eyes: Negative.   Respiratory: Negative for cough, chest tightness, shortness of breath and wheezing.   Cardiovascular: Negative for chest pain, palpitations and leg swelling.  Gastrointestinal: Negative for nausea, abdominal pain, diarrhea and abdominal distention.  Musculoskeletal: Positive for arthralgias and gait problem.  Skin: Negative.  Negative for wound.  Neurological: Positive for weakness. Negative for dizziness and light-headedness.  Psychiatric/Behavioral: Negative.       Objective:   Physical Exam  Constitutional: He is oriented to person, place, and time. He appears well-developed and well-nourished.  Morbidly obese  HENT:  Head: Normocephalic and atraumatic.  Eyes: EOM are normal.  Neck: Normal range of motion. No JVD present.  Cardiovascular: Normal rate.   Pulmonary/Chest: Effort normal. No respiratory distress. He has no wheezes. He has no rales.  Abdominal: Soft. Bowel sounds are normal. He exhibits no distension. There is no tenderness. There is no rebound.  Musculoskeletal: He exhibits no edema.  Neurological: He is alert and oriented to person, place, and time. Coordination normal.  Using wheelchair today    Skin: Skin is warm and dry.  Psychiatric: He has a normal mood and affect.   Filed Vitals:   11/23/15 1536  BP: 122/52  Pulse: 70  Temp: 98.9 F (37.2 C)  TempSrc: Oral  Resp: 18  Height: 6' (1.829 m)  Weight: 296 lb (134.265 kg)  SpO2: 97%      Assessment & Plan:

## 2015-11-27 ENCOUNTER — Encounter (HOSPITAL_COMMUNITY): Payer: Self-pay

## 2015-11-28 ENCOUNTER — Telehealth: Payer: Self-pay | Admitting: Gastroenterology

## 2015-11-28 ENCOUNTER — Ambulatory Visit: Payer: Self-pay | Admitting: Cardiovascular Disease

## 2015-11-28 NOTE — Telephone Encounter (Signed)
Patient is scheduled to see Mike Gipmy Esterwood PA 11/30/15 1:30

## 2015-11-28 NOTE — Telephone Encounter (Signed)
Per [name] at [facility] the pt saw Dr Leone PayorGessner in the hospital and the pt is requesting Dr Leone PayorGessner

## 2015-11-30 ENCOUNTER — Ambulatory Visit: Payer: Self-pay | Admitting: Physician Assistant

## 2015-11-30 ENCOUNTER — Encounter: Payer: Self-pay | Admitting: Adult Health

## 2015-11-30 ENCOUNTER — Non-Acute Institutional Stay (SKILLED_NURSING_FACILITY): Payer: Medicare Other | Admitting: Adult Health

## 2015-11-30 DIAGNOSIS — J111 Influenza due to unidentified influenza virus with other respiratory manifestations: Secondary | ICD-10-CM | POA: Diagnosis not present

## 2015-11-30 NOTE — Progress Notes (Signed)
Patient ID: Mark Roberson, male   DOB: [birth year], 70 y.o.   MRN: [id number]   [facility]        Allergies  Allergen Reactions  . Percocet [Oxycodone-Acetaminophen] Other (See Comments)    hallucination  . Penicillins Hives    Has patient had a PCN reaction causing immediate rash, facial/tongue/throat swelling, SOB or lightheadedness with hypotension: No Has patient had a PCN reaction causing severe rash involving mucus membranes or skin necrosis: No Has patient had a PCN reaction that required hospitalization No Has patient had a PCN reaction occurring within the last 10 years: No If all of the above answers are "NO", then may proceed with Cephalosporin use.    Chief Complaint  Patient presents with  . Acute Visit    Flu    HPI:  Staff reports that he has cough congestion and wheezing present. He is concerned hat he has pneumonia. I am concerned that he may have influenza. There are no reports of fever present. He is not complaining of body aches.    Past Medical History  Diagnosis Date  . CHF (congestive heart failure) (HCC)   . Atrial fibrillation (HCC)   . Diabetes mellitus without complication (HCC)   . Hypertension   . Sleep apnea   . Asthma   . CKD (chronic kidney disease), stage III   . Anemia     Past Surgical History  Procedure Laterality Date  . 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 Wound debridement Right   . Multiple extractions with alveoloplasty N/A 01/17/2015    Procedure: Extraction of tooth #'s 14,15, 16 with alveolopalsty;  Surgeon: Charlynne Pander, DDS;  Location: [facility] OR;  Service: Oral Surgery;  Laterality: N/A;  . Cardioversion N/A 04/03/2015    Procedure: CARDIOVERSION;  Surgeon: Laurey Morale, MD;  Location: [facility] ENDOSCOPY;  Service: Cardiovascular;  Laterality: N/A;    VITAL SIGNS BP 101/62 mmHg  Pulse 67  Temp(Src) 97.6 F (36.4 C) (Oral)  Resp 22  Ht 6' (1.829 m)  Wt 297 lb (134.718 kg)  BMI 40.27 kg/m2  SpO2  96%  Patient's Medications  New Prescriptions   No medications on file  Previous Medications   ACETAMINOPHEN (TYLENOL) 500 MG TABLET    Take 500 mg by mouth every 4 (four) hours as needed (pain). Do not exceed 4 gms of tylenol in 24 hours   ALBUTEROL (PROVENTIL HFA;VENTOLIN HFA) 108 (90 BASE) MCG/ACT INHALER    Inhale 2 puffs into the lungs every 6 (six) hours as needed for wheezing or shortness of breath. Reported on 11/22/2015   ALBUTEROL (PROVENTIL) (2.5 MG/3ML) 0.083% NEBULIZER SOLUTION    Take 3 mLs (2.5 mg total) by nebulization every 6 (six) hours as needed for wheezing or shortness of breath.   ATORVASTATIN (LIPITOR) 20 MG TABLET    Take 1 tablet (20 mg total) by mouth at bedtime. For HLD   CHOLECALCIFEROL (VITAMIN D) 1000 UNITS TABLET    Take 1,000 Units by mouth daily. for bone health   GABAPENTIN (NEURONTIN) 300 MG CAPSULE    Take 1 capsule (300 mg total) by mouth 2 (two) times daily. For diabetic neuropathy   INSULIN GLARGINE (LANTUS SOLOSTAR) 100 UNIT/ML SOLOSTAR PEN    Inject 40 Units into the skin at bedtime.   INSULIN LISPRO (HUMALOG KWIKPEN [place])    Inject into the skin. Inject 40 unit  breakfast, 30 units lunch, 40 units dinner, and 10 unit bedtime.  LEVOTHYROXINE (SYNTHROID) 75 MCG TABLET    Take 1 tablet (75 mcg total) by mouth daily before breakfast.   MAGNESIUM 400 MG CAPS    Take 400 mg by mouth daily.   NIACIN 250 MG TABLET    Take 250 mg by mouth daily.   OMEGA-3 FATTY ACIDS (FISH OIL) 1000 MG CAPS    Take 2,000 mg by mouth daily. For HLD   OSELTAMIVIR (TAMIFLU) 75 MG CAPSULE    Take 75 mg by mouth daily. X 5 days   PANTOPRAZOLE (PROTONIX) 40 MG TABLET    Take 1 tablet (40 mg total) by mouth daily.   POTASSIUM CHLORIDE SA (K-DUR,KLOR-CON) 20 MEQ TABLET    Take 1 tablet (20 mEq total) by mouth 2 (two) times a week. Mon / Fri   RIVAROXABAN (XARELTO) 20 MG TABS TABLET    Take 1 tablet (20 mg total) by mouth daily with supper.   TORSEMIDE (DEMADEX) 20 MG TABLET    Take 2  tablets (40 mg total) by mouth 2 (two) times daily.   Modified Medications   No medications on file  Discontinued Medications     SIGNIFICANT DIAGNOSTIC EXAMS  05-02-15: chest x-ray: Interstitial pulmonary edema compatible with mild CHF.  05-03-15: 2-d echo: Left ventricle: The cavity size was normal. Wall thickness was normal. Systolic function was vigorous. The estimated ejection fraction was in the range of 65% to 70%. Wall motion was normal; there were no regional wall motion abnormalities. Doppler parameters are consistent with elevated mean left atrial fillingpressure. - Aortic valve: Valve area (Vmax): 2.71 cm^2. - Mitral valve: Moderately calcified annulus. There was mild to moderate regurgitation. Valve area by pressure half-time: 1.71 cm^2. Valve area by continuity equation (using LVOT flow): 2.39 cm^2. - Left atrium: The atrium was severely dilated. - Right ventricle: The cavity size was mildly dilated. - Right atrium: The atrium was moderately to severely dilated. - Pulmonary arteries: Systolic pressure was moderately increased. PA peak pressure: 58 mm Hg (S).  05-04-15:renal ultrasound: Small left kidney with renal cortical thinning compared to the normal-appearing right kidney. This size discrepancy raises question of renal artery stenosis on the left. In this regard, question whether patient is hypertensive. Study otherwise unremarkable.  09-04-15: 2-d echo: - Left ventricle: The cavity size was mildly dilated. Wall thickness was increased in a pattern of mild LVH. Systolic function was vigorous. The estimated ejection fraction was in the range of 65% to 70%. Wall motion was normal; there were no regional wall motion abnormalities. Doppler parameters are consistent with high ventricular filling pressure. - Mitral valve: Severely calcified annulus. There was mild regurgitation. - Left atrium: The atrium was moderately dilated. - Right atrium: The atrium was mildly dilated. -  Pulmonary arteries: Systolic pressure was mildly increased.  11-13-15: chest x-ray: No acute cardiopulmonary disease    LABS REVIEWED:   05-02-15: wbc 8.5; hgb 8.2; hct 28.4; mcv 86.6 ;pt 173; glucose 212; bun 83; creat 2.80; k+4.0  na++134; mag 2.8; BNP 284.3 05-06-15: wbc 7.7; hgb 83; hct 28.0; mcv 86.2; plt 179; glucose 69; bun 72; creat 1.93; k+4.0; na++137;  05-18-15: wbc 8.5; hgb 8.5; hct 28.9; mcv 87.6; plt 157; glucose 173; bun 77; creat 2.18; k+4.2; na++141; tsh 12.066  05-24-15: glucose 155; bun 65; creat 1.65; k+ 4.1; na++140  10-19-15: wbc 7.2; hgb 9.8; hct 31.9; mcv 87.9; plt 185; glucose 176; bun 76; creat 2.56; k+ 3.6; na++ 135; liver normal albumin 3.3 11-13-15; wbc 7.1; hgb 9.0; hct 27.9;  mcv 86.6; plt 175; glucose 100; bun 100; creat 3.42; k+ 3.9; na++ 136; liver normal albumin 3.2 11-14-15; vitamin B12: 420; folate 14.6; iron 38; tibc 392; ferritin 26 11-15-15: tsh 18.746; cortisol 8.3 11-22-15: wbc 9.2; hgb 10.6; hct 31.5; mcv 84.2; plt 129; glucose 160; bun 61; creat 2.02; k+ 3.8; na++136        Review of Systems Constitutional: Negative for appetite change and fatigue.  HENT: Negative for congestion.   Respiratory: cough congestion wheezing   Cardiovascular: Negative for chest pain, palpitations and leg swelling.  Gastrointestinal: Negative for nausea, abdominal pain, diarrhea and constipation.  Musculoskeletal: Negative for myalgias and arthralgias.  Skin: Negative for pallor.  Neurological: Negative for dizziness.  Psychiatric/Behavioral: The patient is not nervous/anxious.      Physical Exam Constitutional: He is oriented to person, place, and time. No distress.  Obese   Eyes: Conjunctivae are normal.  Neck: Neck supple. No JVD present. No thyromegaly present.  Cardiovascular: Normal rate, regular rhythm and intact distal pulses.   Respiratory: has rhonchi and wheezing has increased effort   GI: Soft. Bowel sounds are normal. He exhibits no distension. There is  no tenderness.  Musculoskeletal: He exhibits edema.  Able to move all extremities  Has trace lower extremity edema   Lymphadenopathy:    He has no cervical adenopathy.  Neurological: He is alert and oriented to person, place, and time.  Skin: Skin is warm and dry. He is not diaphoretic.  Psychiatric: He has a normal mood and affect.     ASSESSMENT/ PLAN:  1. Influenza: will get rapid flu; will begin tamiflu 75 mg twice daily and will get a chest x-ray.    Synthia Innocent NP [facility]  Contact [phone] Monday through Friday 8am- 5pm  After hours call [phone]

## 2015-12-01 ENCOUNTER — Encounter: Payer: Self-pay | Admitting: Adult Health

## 2015-12-01 ENCOUNTER — Non-Acute Institutional Stay (SKILLED_NURSING_FACILITY): Payer: Medicare Other | Admitting: Adult Health

## 2015-12-01 DIAGNOSIS — E034 Atrophy of thyroid (acquired): Secondary | ICD-10-CM

## 2015-12-01 DIAGNOSIS — E038 Other specified hypothyroidism: Secondary | ICD-10-CM

## 2015-12-01 NOTE — Progress Notes (Signed)
Patient ID: Mark Roberson, male   DOB: 22-Apr-1946, 70 y.o.   MRN: [id number]   Facility: [facility]       Allergies  Allergen Reactions  . Percocet [Oxycodone-Acetaminophen] Other (See Comments)    hallucination  . Penicillins Hives    Has patient had a PCN reaction causing immediate rash, facial/tongue/throat swelling, SOB or lightheadedness with hypotension: No Has patient had a PCN reaction causing severe rash involving mucus membranes or skin necrosis: No Has patient had a PCN reaction that required hospitalization No Has patient had a PCN reaction occurring within the last 10 years: No If all of the above answers are "NO", then may proceed with Cephalosporin use.    Chief Complaint  Patient presents with  . Acute Visit    Lab follow up    HPI:  His tsh is is elevated at 9.65. He is presently taking synthroid 75 mcg daily. He tells me that he is feeling a little bit better today. His chest x-ray does not demonstrate pneumonia. There no nursing concerns at this time.    Past Medical History  Diagnosis Date  . CHF (congestive heart failure) (HCC)   . Atrial fibrillation (HCC)   . Diabetes mellitus without complication (HCC)   . Hypertension   . Sleep apnea   . Asthma   . CKD (chronic kidney disease), stage III   . Anemia     Past Surgical History  Procedure Laterality Date  . 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 Wound debridement Right   . Multiple extractions with alveoloplasty N/A 01/17/2015    Procedure: Extraction of tooth #'s 14,15, 16 with alveolopalsty;  Surgeon: Charlynne Pander, DDS;  Location: [facility] OR;  Service: Oral Surgery;  Laterality: N/A;  . Cardioversion N/A 04/03/2015    Procedure: CARDIOVERSION;  Surgeon: Laurey Morale, MD;  Location: [facility] ENDOSCOPY;  Service: Cardiovascular;  Laterality: N/A;    VITAL SIGNS BP 101/62 mmHg  Pulse 67  Temp(Src) 97.6 F (36.4 C) (Oral)  Resp 22  Ht 6' (1.829 m)  Wt 297 lb (134.718 kg)  BMI 40.27 kg/m2   SpO2 96%  Patient's Medications  New Prescriptions   No medications on file  Previous Medications   ACETAMINOPHEN (TYLENOL) 500 MG TABLET    Take 500 mg by mouth every 4 (four) hours as needed (pain). Do not exceed 4 gms of tylenol in 24 hours   ALBUTEROL (PROVENTIL HFA;VENTOLIN HFA) 108 (90 BASE) MCG/ACT INHALER    Inhale 2 puffs into the lungs every 6 (six) hours as needed for wheezing or shortness of breath. Reported on 11/22/2015   ALBUTEROL (PROVENTIL) (2.5 MG/3ML) 0.083% NEBULIZER SOLUTION    Take 3 mLs (2.5 mg total) by nebulization every 6 (six) hours as needed for wheezing or shortness of breath.   ATORVASTATIN (LIPITOR) 20 MG TABLET    Take 1 tablet (20 mg total) by mouth at bedtime. For HLD   CHOLECALCIFEROL (VITAMIN D) 1000 UNITS TABLET    Take 1,000 Units by mouth daily. for bone health   GABAPENTIN (NEURONTIN) 300 MG CAPSULE    Take 1 capsule (300 mg total) by mouth 2 (two) times daily. For diabetic neuropathy   INSULIN GLARGINE (LANTUS SOLOSTAR) 100 UNIT/ML SOLOSTAR PEN    Inject 40 Units into the skin at bedtime.   INSULIN LISPRO (HUMALOG KWIKPEN [place])    Inject into the skin. Inject 40 unit  breakfast, 30 units lunch, 40 units dinner, and 10 unit  bedtime.   LEVOTHYROXINE (SYNTHROID) 75 MCG TABLET    Take 1 tablet (75 mcg total) by mouth daily before breakfast.   MAGNESIUM 400 MG CAPS    Take 400 mg by mouth daily.   NIACIN 250 MG TABLET    Take 250 mg by mouth daily.   OMEGA-3 FATTY ACIDS (FISH OIL) 1000 MG CAPS    Take 2,000 mg by mouth daily. For HLD   OSELTAMIVIR (TAMIFLU) 75 MG CAPSULE    Take 75 mg by mouth daily. X 5 days   PANTOPRAZOLE (PROTONIX) 40 MG TABLET    Take 1 tablet (40 mg total) by mouth daily.   POTASSIUM CHLORIDE SA (K-DUR,KLOR-CON) 20 MEQ TABLET    Take 1 tablet (20 mEq total) by mouth 2 (two) times a week. Mon / Fri   RIVAROXABAN (XARELTO) 20 MG TABS TABLET    Take 1 tablet (20 mg total) by mouth daily with supper.   TORSEMIDE (DEMADEX) 20 MG TABLET    Take 2  tablets (40 mg total) by mouth 2 (two) times daily. Do not take on 10/21/15 & 10/22/15. Start taking on 10/23/15.  Modified Medications   No medications on file  Discontinued Medications   No medications on file     SIGNIFICANT DIAGNOSTIC EXAMS   05-02-15: chest x-ray: Interstitial pulmonary edema compatible with mild CHF.  05-03-15: 2-d echo: Left ventricle: The cavity size was normal. Wall thickness was normal. Systolic function was vigorous. The estimated ejection fraction was in the range of 65% to 70%. Wall motion was normal; there were no regional wall motion abnormalities. Doppler parameters are consistent with elevated mean left atrial fillingpressure. - Aortic valve: Valve area (Vmax): 2.71 cm^2. - Mitral valve: Moderately calcified annulus. There was mild to moderate regurgitation. Valve area by pressure half-time: 1.71 cm^2. Valve area by continuity equation (using LVOT flow): 2.39 cm^2. - Left atrium: The atrium was severely dilated. - Right ventricle: The cavity size was mildly dilated. - Right atrium: The atrium was moderately to severely dilated. - Pulmonary arteries: Systolic pressure was moderately increased. PA peak pressure: 58 mm Hg (S).  05-04-15:renal ultrasound: Small left kidney with renal cortical thinning compared to the normal-appearing right kidney. This size discrepancy raises question of renal artery stenosis on the left. In this regard, question whether patient is hypertensive. Study otherwise unremarkable.  09-04-15: 2-d echo: - Left ventricle: The cavity size was mildly dilated. Wall thickness was increased in a pattern of mild LVH. Systolic function was vigorous. The estimated ejection fraction was in the range of 65% to 70%. Wall motion was normal; there were no regional wall motion abnormalities. Doppler parameters are consistent with high ventricular filling pressure. - Mitral valve: Severely calcified annulus. There was mild regurgitation. - Left atrium: The  atrium was moderately dilated. - Right atrium: The atrium was mildly dilated. - Pulmonary arteries: Systolic pressure was mildly increased.  11-13-15: chest x-ray: No acute cardiopulmonary disease  11-30-15: chest x-ray; no acute cardiopulmonary disease process     LABS REVIEWED:   05-02-15: wbc 8.5; hgb 8.2; hct 28.4; mcv 86.6 ;pt 173; glucose 212; bun 83; creat 2.80; k+4.0  na++134; mag 2.8; BNP 284.3 05-06-15: wbc 7.7; hgb 83; hct 28.0; mcv 86.2; plt 179; glucose 69; bun 72; creat 1.93; k+4.0; na++137;  05-18-15: wbc 8.5; hgb 8.5; hct 28.9; mcv 87.6; plt 157; glucose 173; bun 77; creat 2.18; k+4.2; na++141; tsh 12.066  05-24-15: glucose 155; bun 65; creat 1.65; k+ 4.1; na++140  10-19-15: wbc  7.2; hgb 9.8; hct 31.9; mcv 87.9; plt 185; glucose 176; bun 76; creat 2.56; k+ 3.6; na++ 135; liver normal albumin 3.3 11-13-15; wbc 7.1; hgb 9.0; hct 27.9; mcv 86.6; plt 175; glucose 100; bun 100; creat 3.42; k+ 3.9; na++ 136; liver normal albumin 3.2 11-14-15; vitamin B12: 420; folate 14.6; iron 38; tibc 392; ferritin 26 11-15-15: tsh 18.746; cortisol 8.3 11-22-15: wbc 9.2; hgb 10.6; hct 31.5; mcv 84.2; plt 129; glucose 160; bun 61; creat 2.02; k+ 3.8; na++136  11-29-15: tsh 9.65       Review of Systems Constitutional: Negative for appetite change and fatigue.  HENT: Negative for congestion.   Respiratory: cough congestion wheezing   Cardiovascular: Negative for chest pain, palpitations and leg swelling.  Gastrointestinal: Negative for nausea, abdominal pain, diarrhea and constipation.  Musculoskeletal: Negative for myalgias and arthralgias.  Skin: Negative for pallor.  Neurological: Negative for dizziness.  Psychiatric/Behavioral: The patient is not nervous/anxious.      Physical Exam Constitutional: He is oriented to person, place, and time. No distress.  Obese   Eyes: Conjunctivae are normal.  Neck: Neck supple. No JVD present. No thyromegaly present.  Cardiovascular: Normal rate, regular  rhythm and intact distal pulses.   Respiratory: has rhonchi and wheezing has increased effort   GI: Soft. Bowel sounds are normal. He exhibits no distension. There is no tenderness.  Musculoskeletal: He exhibits edema.  Able to move all extremities  Has trace lower extremity edema   Lymphadenopathy:    He has no cervical adenopathy.  Neurological: He is alert and oriented to person, place, and time.  Skin: Skin is warm and dry. He is not diaphoretic.  Psychiatric: He has a normal mood and affect.      ASSESSMENT/ PLAN:  1. Hypothyroidism: will increase synthroid to 100 mcg daily; tsh is 9.65; will check tsh in 4 weeks.      Synthia Innocenteborah Coty Student NP [facility]  Contact [phone] Monday through Friday 8am- 5pm  After hours call [phone]

## 2015-12-05 ENCOUNTER — Other Ambulatory Visit (HOSPITAL_COMMUNITY): Payer: Self-pay | Admitting: Cardiology

## 2015-12-13 ENCOUNTER — Telehealth (HOSPITAL_COMMUNITY): Payer: Self-pay | Admitting: *Deleted

## 2015-12-13 NOTE — Telephone Encounter (Signed)
Pt called and left a VM earlier this afternoon that his wt was up 14 lb since Fri.  Discussed w/Dr Shirlee LatchMcLean he would like pt to increase Torsemide to 60 mg BID for 5 days then back to 40 mg BID and sch a f/u appt next week.  Attempted to call pt and Left message to call back

## 2015-12-13 NOTE — Telephone Encounter (Signed)
Pt aware of med changes. Orders faxed to [facility]. Pt appointment moved up to 12/20/15 at 1140am

## 2015-12-14 ENCOUNTER — Non-Acute Institutional Stay (SKILLED_NURSING_FACILITY): Payer: Medicare Other | Admitting: Adult Health

## 2015-12-14 ENCOUNTER — Encounter: Payer: Self-pay | Admitting: Adult Health

## 2015-12-14 DIAGNOSIS — I5032 Chronic diastolic (congestive) heart failure: Secondary | ICD-10-CM

## 2015-12-14 NOTE — Progress Notes (Signed)
Patient ID: Mark Roberson, male   DOB: Apr 02, 1946, 70 y.o.   MRN: [id number]   Facility[facility]       Allergies  Allergen Reactions  . Percocet [Oxycodone-Acetaminophen] Other (See Comments)    hallucination  . Penicillins Hives    Chief Complaint  Patient presents with  . Acute Visit    Heart Failure    HPI:  Staff reports that he has been gaining weight. He will decline to be weighed most days. He tells me that he will only follow the instructions of his cardiologist regarding his fluid medication. His cardiologist has made recommendations and he has a follow up scheduled with cardiology on 12-20-15.   Past Medical History  Diagnosis Date  . CHF (congestive heart failure) (HCC)   . Atrial fibrillation (HCC)   . Diabetes mellitus without complication (HCC)   . Hypertension   . Sleep apnea   . Asthma   . CKD (chronic kidney disease), stage III   . Anemia     Past Surgical History  Procedure Laterality Date  . 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 Wound debridement Right   . Multiple extractions with alveoloplasty N/A 01/17/2015    Procedure: Extraction of tooth #'s 14,15, 16 with alveolopalsty;  Surgeon: Charlynne Pander, DDS;  Location: [facility] OR;  Service: Oral Surgery;  Laterality: N/A;  . Cardioversion N/A 04/03/2015    Procedure: CARDIOVERSION;  Surgeon: Laurey Morale, MD;  Location: [facility] ENDOSCOPY;  Service: Cardiovascular;  Laterality: N/A;    VITAL SIGNS BP 100/70 mmHg  Pulse 78  Temp(Src) 98 F (36.7 C) (Oral)  Resp 18  Ht 6' (1.829 m)  Wt 311 lb (141.069 kg)  BMI 42.17 kg/m2  SpO2 96%  Patient's Medications  New Prescriptions   No medications on file  Previous Medications   ACETAMINOPHEN (TYLENOL) 500 MG TABLET    Take 500 mg by mouth every 4 (four) hours as needed (pain). Do not exceed 4 gms of tylenol in 24 hours   ALBUTEROL (PROVENTIL HFA;VENTOLIN HFA) 108 (90 BASE) MCG/ACT INHALER    Inhale 2 puffs into the lungs every 6 (six) hours as  needed for wheezing or shortness of breath. Reported on 11/22/2015   ALBUTEROL (PROVENTIL) (2.5 MG/3ML) 0.083% NEBULIZER SOLUTION    Take 3 mLs (2.5 mg total) by nebulization every 6 (six) hours as needed for wheezing or shortness of breath.   ATORVASTATIN (LIPITOR) 20 MG TABLET    Take 1 tablet (20 mg total) by mouth at bedtime. For HLD   CHOLECALCIFEROL (VITAMIN D) 1000 UNITS TABLET    Take 1,000 Units by mouth daily. for bone health   GABAPENTIN (NEURONTIN) 300 MG CAPSULE    Take 1 capsule (300 mg total) by mouth 2 (two) times daily. For diabetic neuropathy   INSULIN GLARGINE (LANTUS SOLOSTAR) 100 UNIT/ML SOLOSTAR PEN    Inject 40 Units into the skin at bedtime.   INSULIN LISPRO (HUMALOG KWIKPEN [place])    Inject into the skin. Inject 40 unit  breakfast, 30 units lunch, 40 units dinner, and 10 unit bedtime.   LEVOTHYROXINE (SYNTHROID, LEVOTHROID) 100 MCG TABLET    Take 100 mcg by mouth daily before breakfast.   MAGNESIUM 400 MG CAPS    Take 400 mg by mouth daily.   NIACIN 250 MG TABLET    Take 250 mg by mouth daily.   OMEGA-3 FATTY ACIDS (FISH OIL) 1000 MG CAPS    Take 2,000 mg by  mouth daily. For HLD   PANTOPRAZOLE (PROTONIX) 40 MG TABLET    Take 1 tablet (40 mg total) by mouth daily.   POTASSIUM CHLORIDE SA (K-DUR,KLOR-CON) 20 MEQ TABLET    Take 1 tablet (20 mEq total) by mouth 2 (two) times a week. Mon / Fri   RIVAROXABAN (XARELTO) 20 MG TABS TABLET    Take 1 tablet (20 mg total) by mouth daily with supper.   TORSEMIDE (DEMADEX) 20 MG TABLET    Take 2 tablets (40 mg total) by mouth 2 (two) times daily. Do not take on 10/21/15 & 10/22/15. Start taking on 10/23/15.   XARELTO 15 MG TABS TABLET    TAKE 1 TABLET(15 MG) BY MOUTH DAILY WITH BREAKFAST  Modified Medications   No medications on file  Discontinued Medications   LEVOTHYROXINE (SYNTHROID) 75 MCG TABLET    Take 1 tablet (75 mcg total) by mouth daily before breakfast.   OSELTAMIVIR (TAMIFLU) 75 MG CAPSULE    Take 75 mg by mouth daily. Reported on  12/14/2015     SIGNIFICANT DIAGNOSTIC EXAMS  05-02-15: chest x-ray: Interstitial pulmonary edema compatible with mild CHF.  05-03-15: 2-d echo: Left ventricle: The cavity size was normal. Wall thickness was normal. Systolic function was vigorous. The estimated ejection fraction was in the range of 65% to 70%. Wall motion was normal; there were no regional wall motion abnormalities. Doppler parameters are consistent with elevated mean left atrial fillingpressure. - Aortic valve: Valve area (Vmax): 2.71 cm^2. - Mitral valve: Moderately calcified annulus. There was mild to moderate regurgitation. Valve area by pressure half-time: 1.71 cm^2. Valve area by continuity equation (using LVOT flow): 2.39 cm^2. - Left atrium: The atrium was severely dilated. - Right ventricle: The cavity size was mildly dilated. - Right atrium: The atrium was moderately to severely dilated. - Pulmonary arteries: Systolic pressure was moderately increased. PA peak pressure: 58 mm Hg (S).  05-04-15:renal ultrasound: Small left kidney with renal cortical thinning compared to the normal-appearing right kidney. This size discrepancy raises question of renal artery stenosis on the left. In this regard, question whether patient is hypertensive. Study otherwise unremarkable.  09-04-15: 2-d echo: - Left ventricle: The cavity size was mildly dilated. Wall thickness was increased in a pattern of mild LVH. Systolic function was vigorous. The estimated ejection fraction was in the range of 65% to 70%. Wall motion was normal; there were no regional wall motion abnormalities. Doppler parameters are consistent with high ventricular filling pressure. - Mitral valve: Severely calcified annulus. There was mild regurgitation. - Left atrium: The atrium was moderately dilated. - Right atrium: The atrium was mildly dilated. - Pulmonary arteries: Systolic pressure was mildly increased.  11-13-15: chest x-ray: No acute cardiopulmonary  disease  11-30-15: chest x-ray; no acute cardiopulmonary disease process   12-14-15: chest x-ray: no acute cardiopulmonary disease process     LABS REVIEWED:   05-02-15: wbc 8.5; hgb 8.2; hct 28.4; mcv 86.6 ;pt 173; glucose 212; bun 83; creat 2.80; k+4.0  na++134; mag 2.8; BNP 284.3 05-06-15: wbc 7.7; hgb 83; hct 28.0; mcv 86.2; plt 179; glucose 69; bun 72; creat 1.93; k+4.0; na++137;  05-18-15: wbc 8.5; hgb 8.5; hct 28.9; mcv 87.6; plt 157; glucose 173; bun 77; creat 2.18; k+4.2; na++141; tsh 12.066  05-24-15: glucose 155; bun 65; creat 1.65; k+ 4.1; na++140  10-19-15: wbc 7.2; hgb 9.8; hct 31.9; mcv 87.9; plt 185; glucose 176; bun 76; creat 2.56; k+ 3.6; na++ 135; liver normal albumin 3.3 11-13-15; wbc 7.1;  hgb 9.0; hct 27.9; mcv 86.6; plt 175; glucose 100; bun 100; creat 3.42; k+ 3.9; na++ 136; liver normal albumin 3.2 11-14-15; vitamin B12: 420; folate 14.6; iron 38; tibc 392; ferritin 26 11-15-15: tsh 18.746; cortisol 8.3 11-22-15: wbc 9.2; hgb 10.6; hct 31.5; mcv 84.2; plt 129; glucose 160; bun 61; creat 2.02; k+ 3.8; na++136  11-29-15: tsh 9.65       Review of Systems Constitutional: Negative for appetite change and fatigue.  HENT: Negative for congestion.   Respiratory: cough congestion wheezing   Cardiovascular: Negative for chest pain, palpitations and leg swelling.  Gastrointestinal: Negative for nausea, abdominal pain, diarrhea and constipation.  Musculoskeletal: Negative for myalgias and arthralgias.  Skin: Negative for pallor.  Neurological: Negative for dizziness.  Psychiatric/Behavioral: The patient is not nervous/anxious.      Physical Exam Constitutional: He is oriented to person, place, and time. No distress.  Obese   Eyes: Conjunctivae are normal.  Neck: Neck supple. No JVD present. No thyromegaly present.  Cardiovascular: Normal rate, regular rhythm and intact distal pulses.   Respiratory: has  Few rhonchi and wheezing has normal effort  GI: Soft. Bowel sounds are  normal. He exhibits no distension. There is no tenderness.  Musculoskeletal: He exhibits edema.  Able to move all extremities  Has  1+ lower extremity edema   Lymphadenopathy:    He has no cervical adenopathy.  Neurological: He is alert and oriented to person, place, and time.  Skin: Skin is warm and dry. He is not diaphoretic.  Psychiatric: He has a normal mood and affect.      ASSESSMENT/ PLAN:  1. Chronic diastolic heart failure: will begin demadex 60 mg twice daily for 3 days then 40 mg twice daily. Will check bmp and bnp in the AM he has a cardiology appointment on 12-20-15.        Synthia Innocent NP [facility]  Contact [phone] Monday through Friday 8am- 5pm  After hours call [phone]

## 2015-12-20 ENCOUNTER — Ambulatory Visit (HOSPITAL_COMMUNITY)
Admission: RE | Admit: 2015-12-20 | Discharge: 2015-12-20 | Disposition: A | Discharge status: Home / Self Care | Payer: No Typology Code available for payment source | Source: Ambulatory Visit | Attending: Internal Medicine | Admitting: Internal Medicine

## 2015-12-20 VITALS — BP 124/52 | HR 63 | Wt 308.2 lb

## 2015-12-20 DIAGNOSIS — Z9981 Dependence on supplemental oxygen: Secondary | ICD-10-CM | POA: Insufficient documentation

## 2015-12-20 DIAGNOSIS — I130 Hypertensive heart and chronic kidney disease with heart failure and stage 1 through stage 4 chronic kidney disease, or unspecified chronic kidney disease: Secondary | ICD-10-CM | POA: Diagnosis not present

## 2015-12-20 DIAGNOSIS — Z96652 Presence of left artificial knee joint: Secondary | ICD-10-CM | POA: Diagnosis not present

## 2015-12-20 DIAGNOSIS — Z955 Presence of coronary angioplasty implant and graft: Secondary | ICD-10-CM | POA: Insufficient documentation

## 2015-12-20 DIAGNOSIS — D649 Anemia, unspecified: Secondary | ICD-10-CM | POA: Diagnosis not present

## 2015-12-20 DIAGNOSIS — Z79899 Other long term (current) drug therapy: Secondary | ICD-10-CM | POA: Diagnosis not present

## 2015-12-20 DIAGNOSIS — G4733 Obstructive sleep apnea (adult) (pediatric): Secondary | ICD-10-CM | POA: Diagnosis not present

## 2015-12-20 DIAGNOSIS — Z7901 Long term (current) use of anticoagulants: Secondary | ICD-10-CM | POA: Diagnosis not present

## 2015-12-20 DIAGNOSIS — I2510 Atherosclerotic heart disease of native coronary artery without angina pectoris: Secondary | ICD-10-CM | POA: Diagnosis not present

## 2015-12-20 DIAGNOSIS — I5032 Chronic diastolic (congestive) heart failure: Secondary | ICD-10-CM | POA: Diagnosis present

## 2015-12-20 DIAGNOSIS — I481 Persistent atrial fibrillation: Secondary | ICD-10-CM | POA: Diagnosis not present

## 2015-12-20 DIAGNOSIS — Z8249 Family history of ischemic heart disease and other diseases of the circulatory system: Secondary | ICD-10-CM | POA: Insufficient documentation

## 2015-12-20 DIAGNOSIS — Z794 Long term (current) use of insulin: Secondary | ICD-10-CM | POA: Insufficient documentation

## 2015-12-20 DIAGNOSIS — E1165 Type 2 diabetes mellitus with hyperglycemia: Secondary | ICD-10-CM | POA: Insufficient documentation

## 2015-12-20 DIAGNOSIS — N184 Chronic kidney disease, stage 4 (severe): Secondary | ICD-10-CM | POA: Diagnosis not present

## 2015-12-20 DIAGNOSIS — E039 Hypothyroidism, unspecified: Secondary | ICD-10-CM | POA: Insufficient documentation

## 2015-12-20 DIAGNOSIS — E1122 Type 2 diabetes mellitus with diabetic chronic kidney disease: Secondary | ICD-10-CM | POA: Insufficient documentation

## 2015-12-20 DIAGNOSIS — I739 Peripheral vascular disease, unspecified: Secondary | ICD-10-CM | POA: Insufficient documentation

## 2015-12-20 LAB — BASIC METABOLIC PANEL
Anion gap: 10 (ref 5–15)
BUN: 33 mg/dL — AB (ref 6–20)
CALCIUM: 9.1 mg/dL (ref 8.9–10.3)
CHLORIDE: 97 mmol/L — AB (ref 101–111)
CO2: 29 mmol/L (ref 22–32)
CREATININE: 1.68 mg/dL — AB (ref 0.61–1.24)
GFR calc Af Amer: 46 mL/min — ABNORMAL LOW (ref >60)
GFR, EST NON AFRICAN AMERICAN: 40 mL/min — AB (ref >60)
GLUCOSE: 332 mg/dL — AB (ref 65–99)
Potassium: 3.7 mmol/L (ref 3.5–5.1)
SODIUM: 136 mmol/L (ref 135–145)

## 2015-12-20 NOTE — Patient Instructions (Signed)
Labs today  Your physician recommends that you schedule a follow-up appointment in: 4 weeks with [facility]    Do the following things EVERYDAY: 1) Weigh yourself in the morning before breakfast. Write it down and keep it in a log. 2) Take your medicines as prescribed 3) Eat low salt foods-Limit salt (sodium) to 2000 mg per day.  4) Stay as active as you can everyday 5) Limit all fluids for the day to less than 2 liters 6)

## 2015-12-20 NOTE — Progress Notes (Signed)
Advanced Heart Failure Medication Review by a Pharmacist  Pharmacist comments: Medication MAR from [facility] reviewed and medication history updated. [facility] currently taking Xarelto 20 mg daily and duplicate xarelto was removed from list.    Time with patient: 10 min  Preparation and documentation time: 5 min  Total time: 15 min

## 2015-12-20 NOTE — Progress Notes (Signed)
Patient ID: Mark GoldsMichael Roberson, male   DOB: 1946/06/12, 70 y.o.   MRN: [id number]   PCP: Dr. Genella MechElizabeth Kollar HF Cardiologist: Dr Shirlee LatchMclean.   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  He had PCI back in the 1990s, no cath since the '90s.  He had a successful TEE-guided DCCV for atrial fibrillation in [place] in 2/16.  [place]  [facility] with acute on chronic diastolic CHF.  He was back in atrial fibrillation.  He was admitted again in 5/16 with dyspnea, weakness, and edema.  He was diuresed. He had epistaxis and Xarelto was briefly held.  He had tooth abscesses treated.  DCCV in 7/16 led to brief NSR but he reverted to atrial fibrillation.   He was admitted in 8/16 with acute on chronic diastolic CHF and diuresed well with IV Lasix.  HR was low, Coreg was stopped. After this, he ended up being over-diuresed and diuretics were cut back.   Admitted 10/18/15 with A/C CKD. Diuretics adjusted and he was discharged off torsemide for 2 days then restart on 1/30---> torsemide 60 mg in am and 40 mg in pm. Discharge weight was 303 pounds.   He was admitted again in 2/17 with AKI and hypotension. He was dehydrated/overdiuresed, had continued to take metolazone after we had wanted him to stop it. While in the hospital, he was found to be hypothyroid and started on Levoxyl. He was FOBT+ but no overt bleeding.  Xarelto was continued.  He was sent home on torsemide 40 mg bid.   Today he returns for HF follow up  He is being discharged from SNF on Friday. Completing therapy.  Denies SOB/PND/Orthopnea. All meds provided by SNF. Weight at SNF 308 pounds. Says he had pizza and chinese food last week.  He then required extra torsemide for a few days. Plans to go back and live with his friends.      Labs (2/16): LDL 64 Labs (5/16): K 4.5, creatinine 1.81, Hgb 8.8 Labs (6/16): K 4.1  Creatinine 1.32 , HCT 28.6, BNP 223 Labs (7/16): K 3.3, Creatinine 1.84, HCT 26 Labs (8/16): K 3.8, creatinine 1.64 => 2.18, HCT 28.9, TSH 12 Labs (9/16): K 3.5, creatinine 1.85, BNP 214 Labs (10/21/2015): K 3.9 Creatinine 1.91  Labs (2/17): K 4.5, creatinine 2.35, hgb 8.8 Labs (11/22/2015): K 3.4 Creatinine 2.17   PMH: 1. CKD: Suspect diabetic nephropathy plays a role.  2. Diabetes 3. OSA: Severe on 5/16 sleep study.  Unable to tolerate CPAP.  4. Chronic diastolic CHF: Echo (2/16) with EF 60-65%, mild MR, moderately dilated RV with normal systolic function, PA systolic pressure 50 mmHg. Echo (12/16) with EF 65-70%, technically difficult. 5. Atrial fibrillation: Now persistent.  He had successful TEE[place]guided DCCV in [place] in [place] but atrial fibrillation recurred not long after. DCCV 7/16 on amiodarone => atrial fibrillation quickly recurred.  6. HTN 7. Hypothyroidism.  8. CAD: s/p PCI with DES in the 1990s.  No cardiac cath since 1990s.  9. Anemia 10. 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 12. Bradycardia 13. PAD: 2/17 peripheral arterial dopplers with bilateral 30-49% SFA stenosis, significant bilateral tibial disease, mild decreased ABI on left.   SH: Living [place] friends in [place], [place] from [place] ([place]  Nonsmoker.  No ETOH.   FH: CAD  ROS: All systems reviewed and negative except as  per HPI.   Current Outpatient Prescriptions  Medication Sig Dispense Refill  . acetaminophen (TYLENOL) 500 MG tablet Take 500 mg by mouth every 4 (four) hours as needed (pain). Do not exceed 4 gms of tylenol in 24 hours    . albuterol (PROVENTIL HFA;VENTOLIN HFA) 108 (90 Base) MCG/ACT inhaler Inhale 2 puffs into the lungs every 6 (six) hours as needed for wheezing or shortness of breath. Reported on 11/22/2015    . albuterol (PROVENTIL) (2.5 MG/3ML) 0.083% nebulizer solution Take 3 mLs (2.5 mg total) by nebulization every 6 (six) hours as needed for wheezing or shortness of breath. 75 mL 1  .  atorvastatin (LIPITOR) 20 MG tablet Take 1 tablet (20 mg total) by mouth at bedtime. For HLD 30 tablet 6  . cholecalciferol (VITAMIN D) 1000 UNITS tablet Take 1,000 Units by mouth daily. for bone health    . gabapentin (NEURONTIN) 300 MG capsule Take 1 capsule (300 mg total) by mouth 2 (two) times daily. For diabetic neuropathy 60 capsule 5  . Insulin Glargine (LANTUS SOLOSTAR) 100 UNIT/ML Solostar Pen Inject 40 Units into the skin at bedtime. 15 mL 5  . insulin lispro (HUMALOG) 100 UNIT/ML injection Inject into the skin 3 (three) times daily with meals. Per Sliding Scale    . levothyroxine (SYNTHROID, LEVOTHROID) 100 MCG tablet Take 100 mcg by mouth daily before breakfast.    . Magnesium 400 MG CAPS Take 400 mg by mouth daily.    . niacin 250 MG tablet Take 250 mg by mouth daily.    . Omega-3 Fatty Acids (FISH OIL) 1000 MG CAPS Take 2,000 mg by mouth daily. For HLD    . pantoprazole (PROTONIX) 40 MG tablet Take 1 tablet (40 mg total) by mouth daily.    . potassium chloride SA (K-DUR,KLOR-CON) 20 MEQ tablet Take 1 tablet (20 mEq total) by mouth 2 (two) times a week. Mon / Fri    . rivaroxaban (XARELTO) 20 MG TABS tablet Take 1 tablet (20 mg total) by mouth daily with supper. 30 tablet 6  . torsemide (DEMADEX) 20 MG tablet Take 2 tablets (40 mg total) by mouth 2 (two) times daily. Do not take on 10/21/15 & 10/22/15. Start taking on 10/23/15.     No current facility-administered medications for this encounter.   BP 124/52 mmHg  Pulse 63  Wt 308 lb 3.2 oz (139.799 kg)  SpO2 97% General: NAD, obese. Arrived in wheelchair.  Neck: Thick, no JVD, no thyromegaly or thyroid nodule.  Lungs: Slight crackles at bases bilaterally.  CV: Nondisplaced PMI.  Irregular S1/S2, no S3/S4, 1/6 SEM RUSB.  No edema.  No carotid bruit.  Unable to palpate pedal pulses and purplish hue to distal left foot.   Abdomen: Soft, nontender, no hepatosplenomegaly, no distention.  Skin: Intact without lesions or rashes.    Neurologic: Alert and oriented x 3.  Psych: Normal affect. HEENT: Normal.   Assessment/Plan: 1. Chronic diastolic CHF: EF 16-10% on last echo with RV enlargement.  PA pressure elevated by echo in 4/16. 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  NYHA III chronically.  Volume status stable. Continue torsemide 40 mg twice a day. I have asked him to take and extra torsemide if needed for 3 pound weight gain.  I have asked him to weight and record daily once discharged from  SNF.  2. PAD: No pain, but distal left foot is mild purplish in color.  Does not seem cooler than his other foot. He says it has been like this for several weeks now.  Peripheral arterial doppler study in 2/17 suggested bilateral PAD, worse on left, with significant bilateral tibial disease.   3. Atrial fibrillation: This has been persistent, probably since 2/16.  He has remained in atrial fibrillation despite DCCV attempt 7/16 on amiodarone.  Off Amiodarone and Coreg due to bradycardia. - Will check BMET today and reassess Xarelto dosing (15 mg daily versus 20 mg daily).  4. OSA/suspected OHS: Oxygen level has been better during the day (probably with weight loss) and he is not using oxygen.  He has severe OSA but has not been able to tolerate CPAP. 5. CAD: Stable, no chest pain.  He is on Xarelto with stable CAD so not on aspirin.  Continue statin, check lipids today.  6. CKD Stage III-IV: BMET today.  7. Uncontrolled DM:  Endocrinology following per pt. 8. GI bleeding: FOBT+ in recent hospitalization with anemia but no overt bleeding.  He remains on Xarelto for atrial fibrillation.     Follow up in 1 month.   Amy Clegg NP-C  12/20/2015

## 2015-12-22 ENCOUNTER — Non-Acute Institutional Stay (SKILLED_NURSING_FACILITY): Payer: Medicare Other | Admitting: Adult Health

## 2015-12-22 DIAGNOSIS — G8929 Other chronic pain: Secondary | ICD-10-CM

## 2015-12-22 DIAGNOSIS — N184 Chronic kidney disease, stage 4 (severe): Secondary | ICD-10-CM | POA: Diagnosis not present

## 2015-12-22 DIAGNOSIS — I5032 Chronic diastolic (congestive) heart failure: Secondary | ICD-10-CM

## 2015-12-22 DIAGNOSIS — I4891 Unspecified atrial fibrillation: Secondary | ICD-10-CM

## 2015-12-22 DIAGNOSIS — E1121 Type 2 diabetes mellitus with diabetic nephropathy: Secondary | ICD-10-CM

## 2015-12-22 DIAGNOSIS — Z794 Long term (current) use of insulin: Secondary | ICD-10-CM | POA: Diagnosis not present

## 2015-12-24 DIAGNOSIS — N183 Chronic kidney disease, stage 3 (moderate): Secondary | ICD-10-CM

## 2015-12-24 DIAGNOSIS — E1122 Type 2 diabetes mellitus with diabetic chronic kidney disease: Secondary | ICD-10-CM

## 2015-12-24 DIAGNOSIS — I130 Hypertensive heart and chronic kidney disease with heart failure and stage 1 through stage 4 chronic kidney disease, or unspecified chronic kidney disease: Secondary | ICD-10-CM

## 2015-12-24 DIAGNOSIS — I5022 Chronic systolic (congestive) heart failure: Secondary | ICD-10-CM

## 2015-12-26 ENCOUNTER — Encounter (HOSPITAL_COMMUNITY): Payer: Self-pay

## 2016-01-02 ENCOUNTER — Encounter: Payer: Self-pay | Admitting: Adult Health

## 2016-01-02 NOTE — Progress Notes (Signed)
Patient ID: Mark GoldsMichael Roberson, male   DOB: [birth year], 70 y.o.   MRN: [id number]   [facility]       Allergies  Allergen Reactions  . Percocet [Oxycodone-Acetaminophen] Other (See Comments)    hallucination  . Penicillins Hives    Has patient had a PCN reaction causing immediate rash, facial/tongue/throat swelling, SOB or lightheadedness with hypotension: No Has patient had a PCN reaction causing severe rash involving mucus membranes or skin necrosis: No Has patient had a PCN reaction that required hospitalization No Has patient had a PCN reaction occurring within the last 10 years: No If all of the above answers are "NO", then may proceed with Cephalosporin use.    Chief Complaint  Patient presents with  . Discharge Note    Discharge from facility    HPI:  He is being discharged to home with home health for pt/ot/rn. He will not need dme; he has all necessary equipment at home at this time. He will need his prescriptions to be written and will need to follow up with his pcp.  He had been hospitalized for hypotension and chf. He was admitted to this facility for short term rehab.    Past Medical History  Diagnosis Date  . CHF (congestive heart failure) (HCC)   . Atrial fibrillation (HCC)   . Diabetes mellitus without complication (HCC)   . Hypertension   . Sleep apnea   . Asthma   . CKD (chronic kidney disease), stage III   . Anemia     Past Surgical History  Procedure Laterality Date  . 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 Wound debridement Right   . Multiple extractions with alveoloplasty N/A 01/17/2015    Procedure: Extraction of tooth #'s 14,15, 16 with alveolopalsty;  Surgeon: Charlynne Panderonald F Kulinski, DDS;  [facility];  Service: Oral Surgery;  Laterality: N/A;  . Cardioversion N/A 04/03/2015    Procedure: CARDIOVERSION;  Surgeon: Laurey Moralealton S McLean, MD;  Location[facility] END[facility]COPY;  Service: Cardiovascular;  Laterality: N/A;    VITAL SIGNS BP 118/80 mmHg   Pulse 78  Ht 6' (1.829 m)  Wt 308 lb (139.708 kg)  BMI 41.76 kg/m2  SpO2 96%  Patient's Medications  New Prescriptions   No medications on file  Previous Medications   ACETAMINOPHEN (TYLENOL) 500 MG TABLET    Take 500 mg by mouth every 4 (four) hours as needed (pain). Do not exceed 4 gms of tylenol in 24 hours   ALBUTEROL (PROVENTIL HFA;VENTOLIN HFA) 108 (90 BASE) MCG/ACT INHALER    Inhale 2 puffs into the lungs every 6 (six) hours as needed for wheezing or shortness of breath. Reported on 11/22/2015   ALBUTEROL (PROVENTIL) (2.5 MG/3ML) 0.083% NEBULIZER SOLUTION    Take 3 mLs (2.5 mg total) by nebulization every 6 (six) hours as needed for wheezing or shortness of breath.   ATORVASTATIN (LIPITOR) 20 MG TABLET    Take 1 tablet (20 mg total) by mouth at bedtime. For HLD   CHOLECALCIFEROL (VITAMIN D) 1000 UNITS TABLET    Take 1,000 Units by mouth daily. for bone health   GABAPENTIN (NEURONTIN) 300 MG CAPSULE    Take 1 capsule (300 mg total) by mouth 2 (two) times daily. For diabetic neuropathy   INSULIN GLARGINE (LANTUS SOLOSTAR) 100 UNIT/ML SOLOSTAR PEN    Inject 40 Units into the skin at bedtime.   INSULIN LISPRO (HUMALOG) 100 UNIT/ML INJECTION    Inject into the skin 3 (three)  times daily with meals. Per Sliding Scale   LEVOTHYROXINE (SYNTHROID, LEVOTHROID) 100 MCG TABLET    Take 100 mcg by mouth daily before breakfast.   MAGNESIUM 400 MG CAPS    Take 400 mg by mouth daily.   NIACIN 250 MG TABLET    Take 250 mg by mouth daily.   OMEGA-3 FATTY ACIDS (FISH OIL) 1000 MG CAPS    Take 2,000 mg by mouth daily. For HLD   PANTOPRAZOLE (PROTONIX) 40 MG TABLET    Take 1 tablet (40 mg total) by mouth daily.   POTASSIUM CHLORIDE SA (K-DUR,KLOR-CON) 20 MEQ TABLET    Take 1 tablet (20 mEq total) by mouth 2 (two) times a week. Mon / Fri   RIVAROXABAN (XARELTO) 20 MG TABS TABLET    Take 1 tablet (20 mg total) by mouth daily with supper.   TORSEMIDE (DEMADEX) 20 MG TABLET    Take 2 tablets (40 mg total) by  mouth 2 (two) times daily. Do not take on 10/21/15 & 10/22/15. Start taking on 10/23/15.  Modified Medications   No medications on file  Discontinued Medications   No medications on file     SIGNIFICANT DIAGNOSTIC EXAMS  05-02-15: chest x-ray: Interstitial pulmonary edema compatible with mild CHF.  05-03-15: 2-d echo: Left ventricle: The cavity size was normal. Wall thickness was normal. Systolic function was vigorous. The estimated ejection fraction was in the range of 65% to 70%. Wall motion was normal; there were no regional wall motion abnormalities. Doppler parameters are consistent with elevated mean left atrial fillingpressure. - Aortic valve: Valve area (Vmax): 2.71 cm^2. - Mitral valve: Moderately calcified annulus. There was mild to moderate regurgitation. Valve area by pressure half-time: 1.71 cm^2. Valve area by continuity equation (using LVOT flow): 2.39 cm^2. - Left atrium: The atrium was severely dilated. - Right ventricle: The cavity size was mildly dilated. - Right atrium: The atrium was moderately to severely dilated. - Pulmonary arteries: Systolic pressure was moderately increased. PA peak pressure: 58 mm Hg (S).  05-04-15:renal ultrasound: Small left kidney with renal cortical thinning compared to the normal-appearing right kidney. This size discrepancy raises question of renal artery stenosis on the left. In this regard, question whether patient is hypertensive. Study otherwise unremarkable.  09-04-15: 2-d echo: - Left ventricle: The cavity size was mildly dilated. Wall thickness was increased in a pattern of mild LVH. Systolic function was vigorous. The estimated ejection fraction was in the range of 65% to 70%. Wall motion was normal; there were no regional wall motion abnormalities. Doppler parameters are consistent with high ventricular filling pressure. - Mitral valve: Severely calcified annulus. There was mild regurgitation. - Left atrium: The atrium was moderately  dilated. - Right atrium: The atrium was mildly dilated. - Pulmonary arteries: Systolic pressure was mildly increased.  11-13-15: chest x-ray: No acute cardiopulmonary disease  11-30-15: chest x-ray; no acute cardiopulmonary disease process     LABS REVIEWED:   05-02-15: wbc 8.5; hgb 8.2; hct 28.4; mcv 86.6 ;pt 173; glucose 212; bun 83; creat 2.80; k+4.0  na++134; mag 2.8; BNP 284.3 05-06-15: wbc 7.7; hgb 83; hct 28.0; mcv 86.2; plt 179; glucose 69; bun 72; creat 1.93; k+4.0; na++137;  05-18-15: wbc 8.5; hgb 8.5; hct 28.9; mcv 87.6; plt 157; glucose 173; bun 77; creat 2.18; k+4.2; na++141; tsh 12.066  05-24-15: glucose 155; bun 65; creat 1.65; k+ 4.1; na++140  10-19-15: wbc 7.2; hgb 9.8; hct 31.9; mcv 87.9; plt 185; glucose 176; bun 76; creat 2.56; k+  3.6; na++ 135; liver normal albumin 3.3 11-13-15; wbc 7.1; hgb 9.0; hct 27.9; mcv 86.6; plt 175; glucose 100; bun 100; creat 3.42; k+ 3.9; na++ 136; liver normal albumin 3.2 11-14-15; vitamin B12: 420; folate 14.6; iron 38; tibc 392; ferritin 26 11-15-15: tsh 18.746; cortisol 8.3 11-22-15: wbc 9.2; hgb 10.6; hct 31.5; mcv 84.2; plt 129; glucose 160; bun 61; creat 2.02; k+ 3.8; na++136  11-24-15 influenza: + A 11-29-15: tsh 9.65       Review of Systems Constitutional: Negative for appetite change and fatigue.  HENT: Negative for congestion.   Respiratory: cough congestion wheezing   Cardiovascular: Negative for chest pain, palpitations and leg swelling.  Gastrointestinal: Negative for nausea, abdominal pain, diarrhea and constipation.  Musculoskeletal: Negative for myalgias and arthralgias.  Skin: Negative for pallor.  Neurological: Negative for dizziness.  Psychiatric/Behavioral: The patient is not nervous/anxious.      Physical Exam Constitutional: He is oriented to person, place, and time. No distress.  Obese   Eyes: Conjunctivae are normal.  Neck: Neck supple. No JVD present. No thyromegaly present.  Cardiovascular: Normal rate, regular  rhythm and intact distal pulses.   Respiratory: respirations even and non-labored; lung sounds clear   GI: Soft. Bowel sounds are normal. He exhibits no distension. There is no tenderness.  Musculoskeletal: He exhibits edema.  Able to move all extremities  Has trace lower extremity edema   Lymphadenopathy:    He has no cervical adenopathy.  Neurological: He is alert and oriented to person, place, and time.  Skin: Skin is warm and dry. He is not diaphoretic.  Psychiatric: He has a normal mood and affect.      ASSESSMENT/ PLAN:   Will discharge to home with home health for pt/ot/rn to evaluate and treat as indicated for gait; balance; strength; adl training; medication management and health heart program. He will not need dme; he has all necessary equipment. His prescriptions have been written for a 30 day supply of his medications. He has a follow up with DR. Mclean on 12-26-15.    Time spent with patient 40   minutes >50% time spent counseling; reviewing medical record; tests; labs; and developing future plan of care     Synthia Innocent NP [facility] Medicine  Contact [phone] Monday through Friday 8am- 5pm  After hours call [phone]

## 2016-01-17 ENCOUNTER — Ambulatory Visit (HOSPITAL_COMMUNITY)
Admission: RE | Admit: 2016-01-17 | Discharge: 2016-01-17 | Disposition: A | Discharge status: Home / Self Care | Payer: Medicare Other | Source: Ambulatory Visit | Attending: Internal Medicine | Admitting: Internal Medicine

## 2016-01-17 VITALS — BP 118/56 | HR 71 | Wt 315.0 lb

## 2016-01-17 DIAGNOSIS — I5032 Chronic diastolic (congestive) heart failure: Secondary | ICD-10-CM | POA: Insufficient documentation

## 2016-01-17 DIAGNOSIS — I130 Hypertensive heart and chronic kidney disease with heart failure and stage 1 through stage 4 chronic kidney disease, or unspecified chronic kidney disease: Secondary | ICD-10-CM | POA: Diagnosis not present

## 2016-01-17 DIAGNOSIS — N183 Chronic kidney disease, stage 3 (moderate): Secondary | ICD-10-CM | POA: Insufficient documentation

## 2016-01-17 DIAGNOSIS — D649 Anemia, unspecified: Secondary | ICD-10-CM | POA: Insufficient documentation

## 2016-01-17 DIAGNOSIS — I2510 Atherosclerotic heart disease of native coronary artery without angina pectoris: Secondary | ICD-10-CM | POA: Diagnosis not present

## 2016-01-17 DIAGNOSIS — Z955 Presence of coronary angioplasty implant and graft: Secondary | ICD-10-CM | POA: Diagnosis not present

## 2016-01-17 DIAGNOSIS — Z79899 Other long term (current) drug therapy: Secondary | ICD-10-CM | POA: Insufficient documentation

## 2016-01-17 DIAGNOSIS — E1165 Type 2 diabetes mellitus with hyperglycemia: Secondary | ICD-10-CM | POA: Insufficient documentation

## 2016-01-17 DIAGNOSIS — Z9981 Dependence on supplemental oxygen: Secondary | ICD-10-CM | POA: Insufficient documentation

## 2016-01-17 DIAGNOSIS — Z794 Long term (current) use of insulin: Secondary | ICD-10-CM | POA: Insufficient documentation

## 2016-01-17 DIAGNOSIS — I481 Persistent atrial fibrillation: Secondary | ICD-10-CM | POA: Diagnosis not present

## 2016-01-17 DIAGNOSIS — N184 Chronic kidney disease, stage 4 (severe): Secondary | ICD-10-CM

## 2016-01-17 DIAGNOSIS — Z7901 Long term (current) use of anticoagulants: Secondary | ICD-10-CM | POA: Diagnosis not present

## 2016-01-17 DIAGNOSIS — G4733 Obstructive sleep apnea (adult) (pediatric): Secondary | ICD-10-CM | POA: Diagnosis not present

## 2016-01-17 DIAGNOSIS — Z8249 Family history of ischemic heart disease and other diseases of the circulatory system: Secondary | ICD-10-CM | POA: Insufficient documentation

## 2016-01-17 DIAGNOSIS — E039 Hypothyroidism, unspecified: Secondary | ICD-10-CM | POA: Diagnosis not present

## 2016-01-17 DIAGNOSIS — Z96652 Presence of left artificial knee joint: Secondary | ICD-10-CM | POA: Insufficient documentation

## 2016-01-17 NOTE — Progress Notes (Signed)
Patient ID: Mark Roberson, male   DOB: [birth year], 70 y.o.   MRN: [id number]   PCP: Dr. Genella Mech HF Cardiologist: Dr Shirlee Latch.   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  He had PCI back in the 1990s, no cath since the '90s.  He had a successful TEE-guided DCCV for atrial fibrillation in [place] in 2/16.  Later in 2/16, he moved to [facility].  He was admitted to [facility] with acute on chronic diastolic CHF.  He was back in atrial fibrillation.  He was admitted again in 5/16 with dyspnea, weakness, and edema.  He was diuresed. He had epistaxis and Xarelto was briefly held.  He had tooth abscesses treated.  DCCV in 7/16 led to brief NSR but he reverted to atrial fibrillation.   He was admitted in 8/16 with acute on chronic diastolic CHF and diuresed well with IV Lasix.  HR was low, Coreg was stopped. After this, he ended up being over-diuresed and diuretics were cut back.   Admitted 10/18/15 with A/C CKD. Diuretics adjusted and he was discharged off torsemide for 2 days then restart on 1/30---> torsemide 60 mg in am and 40 mg in pm. Discharge weight was 303 pounds.   He was admitted again in 2/17 with AKI and hypotension. He was dehydrated/overdiuresed, had continued to take metolazone after we had wanted him to stop it. While in the hospital, he was found to be hypothyroid and started on Levoxyl. He was FOBT+ but no overt bleeding.  Xarelto was continued.  He was sent home on torsemide 40 mg bid.   Today he returns for HF follow up  He has been discharged from SNF and plans to return to [facility] with in the next month. Overall feeling ok. Mild dyspnea with exertion.  Ambulated with a walker. Weight at home has been going up 313 pounds. Eating take out every night. Drinking 2 liters daily. Taking all medications.    Labs (2/16): LDL 64 Labs (5/16): K 4.5, creatinine 1.81, Hgb 8.8 Labs (6/16): K 4.1 Creatinine  1.32 , HCT 28.6, BNP 223 Labs (7/16): K 3.3, Creatinine 1.84, HCT 26 Labs (8/16): K 3.8, creatinine 1.64 => 2.18, HCT 28.9, TSH 12 Labs (9/16): K 3.5, creatinine 1.85, BNP 214 Labs (10/21/2015): K 3.9 Creatinine 1.91  Labs (2/17): K 4.5, creatinine 2.35, hgb 8.8 Labs (11/22/2015): K 3.4 Creatinine 2.17  Labs (12/20/2015) : K 3.7 Creatinine 1.68.   PMH: 1. CKD: Suspect diabetic nephropathy plays a role.  2. Diabetes 3. OSA: Severe on 5/16 sleep study.  Unable to tolerate CPAP.  4. Chronic diastolic CHF: Echo (2/16) with EF 60-65%, mild MR, moderately dilated RV with normal systolic function, PA systolic pressure 50 mmHg. Echo (12/16) with EF 65-70%, technically difficult. 5. Atrial fibrillation: Now persistent.  He had successful TEE-guided DCCV in 2/16 in [place] but atrial fibrillation recurred not long after. DCCV 7/16 on amiodarone => atrial fibrillation quickly recurred.  6. HTN 7. Hypothyroidism.  8. CAD: s/p PCI with DES in the 1990s.  No cardiac cath since 1990s.  9. Anemia 10. 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 12. Bradycardia 13. PAD: 2/17 peripheral arterial dopplers with bilateral 30-49% SFA stenosis, significant bilateral tibial disease, mild decreased ABI on left.   SH: Living with friends in [place], originally from [place] (moved to [place] in 2016).  Nonsmoker.  No ETOH.   FH: CAD  ROS: All systems  reviewed and negative except as per HPI.   Current Outpatient Prescriptions  Medication Sig Dispense Refill  . atorvastatin (LIPITOR) 20 MG tablet Take 1 tablet (20 mg total) by mouth at bedtime. For HLD 30 tablet 6  . cholecalciferol (VITAMIN D) 1000 UNITS tablet Take 1,000 Units by mouth daily. for bone health    . gabapentin (NEURONTIN) 300 MG capsule Take 1 capsule (300 mg total) by mouth 2 (two) times daily. For diabetic neuropathy 60 capsule 5  . levothyroxine (SYNTHROID, LEVOTHROID) 100 MCG tablet Take 100 mcg by mouth daily before breakfast.    . Magnesium 400 MG CAPS Take 400 mg  by mouth daily.    . niacin 250 MG tablet Take 250 mg by mouth daily.    . Omega-3 Fatty Acids (FISH OIL) 1000 MG CAPS Take 2,000 mg by mouth daily. For HLD    . pantoprazole (PROTONIX) 40 MG tablet Take 1 tablet (40 mg total) by mouth daily.    . potassium chloride SA (K-DUR,KLOR-CON) 20 MEQ tablet Take 1 tablet (20 mEq total) by mouth 2 (two) times a week. Mon / Fri    . rivaroxaban (XARELTO) 20 MG TABS tablet Take 1 tablet (20 mg total) by mouth daily with supper. 30 tablet 6  . torsemide (DEMADEX) 20 MG tablet Take 2 tablets (40 mg total) by mouth 2 (two) times daily. Do not take on 10/21/15 & 10/22/15. Start taking on 10/23/15.    Marland Kitchen acetaminophen (TYLENOL) 500 MG tablet Take 500 mg by mouth every 4 (four) hours as needed (pain). Do not exceed 4 gms of tylenol in 24 hours    . albuterol (PROVENTIL HFA;VENTOLIN HFA) 108 (90 Base) MCG/ACT inhaler Inhale 2 puffs into the lungs every 6 (six) hours as needed for wheezing or shortness of breath. Reported on 11/22/2015    . albuterol (PROVENTIL) (2.5 MG/3ML) 0.083% nebulizer solution Take 3 mLs (2.5 mg total) by nebulization every 6 (six) hours as needed for wheezing or shortness of breath. 75 mL 1  . Insulin Glargine (LANTUS SOLOSTAR) 100 UNIT/ML Solostar Pen Inject 40 Units into the skin at bedtime. 15 mL 5  . insulin lispro (HUMALOG) 100 UNIT/ML injection Inject into the skin 3 (three) times daily with meals. Per Sliding Scale     No current facility-administered medications for this encounter.   BP 118/56 mmHg  Pulse 71  Wt 315 lb (142.883 kg)  SpO2 95% General: NAD, obese. Ambulated in the clinic with a rolling walker.   Neck: Thick, no JVD, no thyromegaly or thyroid nodule.  Lungs: Decreased in the bases. .  CV: Nondisplaced PMI.  Irregular S1/S2, no S3/S4, 1/6 SEM RUSB.  Rand LLE 1+ edema.   No carotid bruit.  Abdomen: Obese, Soft, nontender, no hepatosplenomegaly, no distention.  Skin: Intact without lesions or rashes.  Neurologic: Alert and  oriented x 3.  Psych: Normal affect. HEENT: Normal.   Assessment/Plan: 1. Chronic diastolic CHF: EF 11-91% on last echo with RV enlargement.  PA pressure elevated by echo in 4/16. 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   NYHA III chronically.  Volume status elevated likely due to hight salt diet. Continue torsemide 40 mg twice a day. I have asked him to take an extra 20 mg of torsemide today and tomorrow.  Lengthy discussion about low salt food choices, limiting  fluid intake to < 2 liters and daily weights.   2. PAD: No pain, but distal left foot is mild purplish in color.  Does not seem cooler than his other foot. He says it has been like this for several weeks now.  Peripheral arterial doppler study in 2/17 suggested bilateral PAD, worse on left, with significant bilateral tibial disease.   3. Atrial fibrillation: This has been persistent, probably since 2/16.  He has remained in atrial fibrillation despite DCCV attempt 7/16 on amiodarone.  Off Amiodarone and Coreg due to bradycardia.On xarelto. Discussed with pharmacy with actual GFR he is on the correct dose of Xarelto.  Continue Xarelto 20 mg daily.  - 4. OSA/suspected OHS: Oxygen level has been better during the day (probably with weight loss) and he is not using oxygen.  He has severe OSA but has not been able to tolerate CPAP. 5. CAD: Stable, no chest pain.  He is on Xarelto with stable CAD so not on aspirin.   6. CKD Stage III-IV: Most recent BMET 3/29 reviewed and this was stable.  7. Uncontrolled DM:  Endocrinology following per pt. 8. GI bleeding: FOBT+ in recent hospitalization with anemia but no overt bleeding.  He remains on Xarelto for atrial fibrillation.     Follow up in 3 weeks with Dr Shirlee LatchMcLean. [facility]    Amy  Clegg NP-C  01/17/2016

## 2016-01-17 NOTE — Patient Instructions (Signed)
Take an additional 20 mg of Torsemide for the next 2 days  Your physician recommends that you schedule a follow-up appointment in: 3-4 weeks  Do the following things EVERYDAY: 1) Weigh yourself in the morning before breakfast. Write it down and keep it in a log. 2) Take your medicines as prescribed 3) Eat low salt foods-Limit salt (sodium) to 2000 mg per day.  4) Stay as active as you can everyday 5) Limit all fluids for the day to less than 2 liters 6)

## 2016-01-17 NOTE — Progress Notes (Signed)
Advanced Heart Failure Medication Review by a Pharmacist  Does the patient  feel that his/her medications are working for him/her?  yes  Has the patient been experiencing any side effects to the medications prescribed?  no  Does the patient measure his/her own blood pressure or blood glucose at home?  yes   Does the patient have any problems obtaining medications due to transportation or finances?   no  Understanding of regimen: fair Understanding of indications: fair Potential of compliance: fair Patient understands to avoid NSAIDs. Patient understands to avoid decongestants.  Issues to address at subsequent visits:    Pharmacist comments: Mr. Mark Roberson is a 70 yo man here for f/u in HF clinic. He was recently discharged from a nursing home at the end of March and is managing his medications at home using a pill box. He was unsure of most of his medications and stated that he fills his pill box weekly by reading the directions on the label, separating out drugs by evening and morning. Denied any excessive bleeding or bruising, dizziness, lightheadedness, or leg cramping. He did mention that his weight had been increasing over the past few days despite adherence to torsemide.   Hillery AldoElizabeth Lacy Taglieri, VermontPharm.D., BCPS PGY2 Cardiology Pharmacy Resident Pager: [phone]  Time with patient: 5 min Preparation and documentation time: 5 min Total time: 10 min

## 2016-02-20 ENCOUNTER — Encounter (HOSPITAL_COMMUNITY): Payer: Self-pay

## 2016-02-29 ENCOUNTER — Inpatient Hospital Stay (HOSPITAL_COMMUNITY): Admission: RE | Admit: 2016-02-29 | Payer: Self-pay | Source: Ambulatory Visit

## 2016-03-25 ENCOUNTER — Ambulatory Visit: Payer: Self-pay | Admitting: Internal Medicine

## 2016-03-25 DIAGNOSIS — Z0289 Encounter for other administrative examinations: Secondary | ICD-10-CM

## 2016-04-09 IMAGING — US US RENAL
1 series · 14 of 25 positions shown · non-contrast
Comparison: None.

CLINICAL DATA: Acute renal injury

EXAM:
RENAL / URINARY TRACT ULTRASOUND COMPLETE

[Series 1: us renal · 0.34mm/px · 14 of 27 slices shown]
[im 1/27]
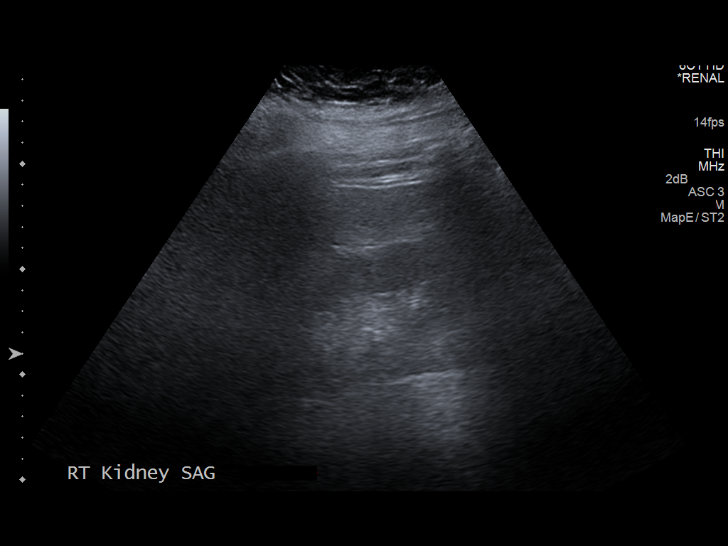
[im 3/27]
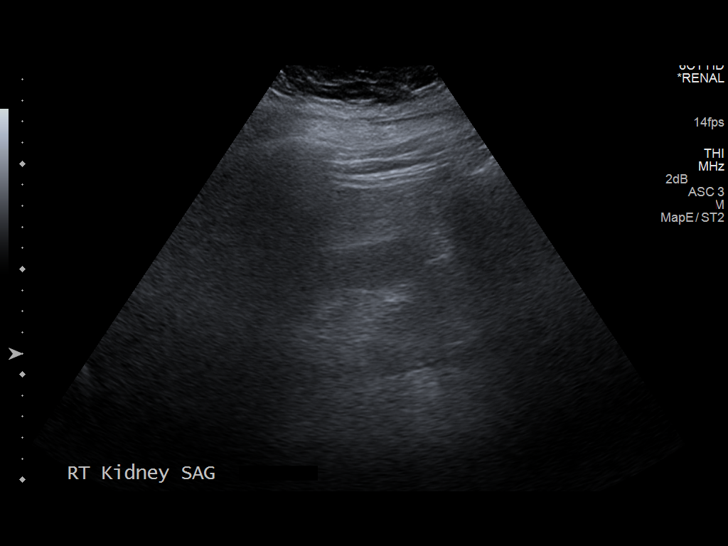
[im 5/27]
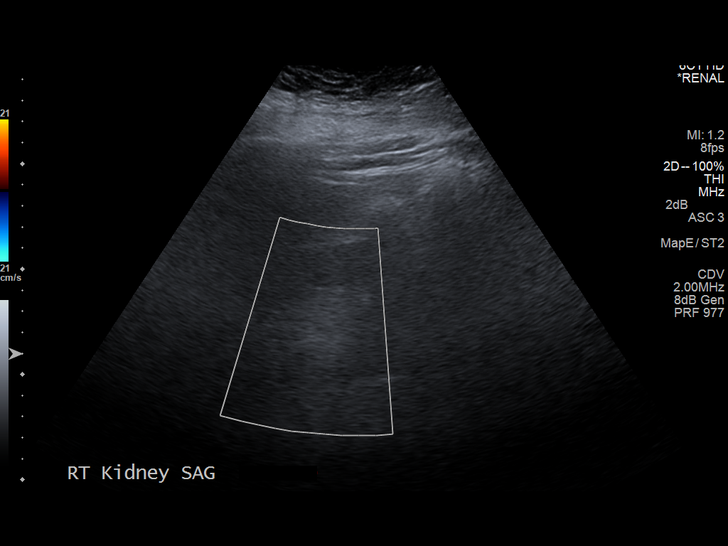
[im 7/27]
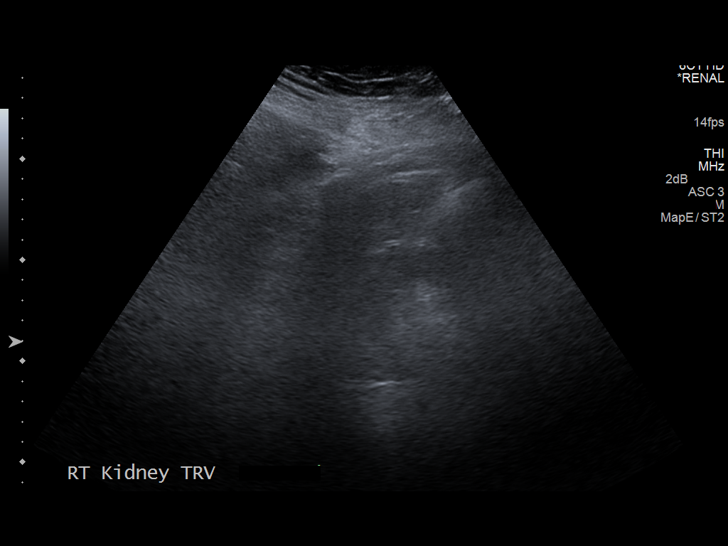
[im 9/27]
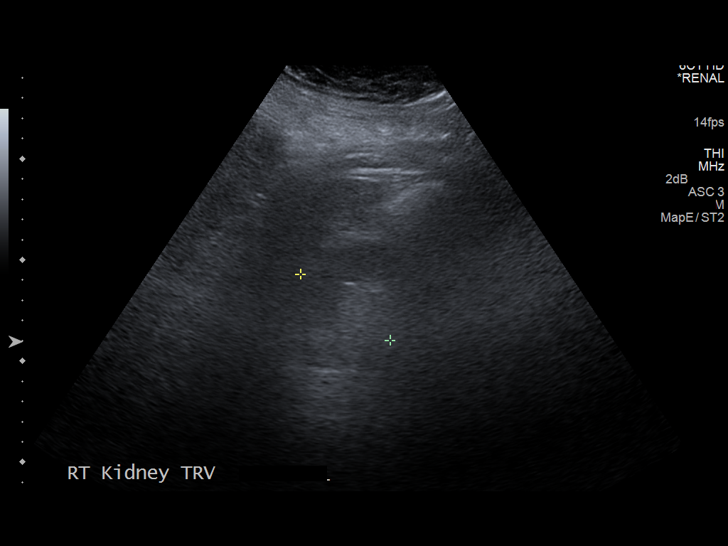
[im 10/27]
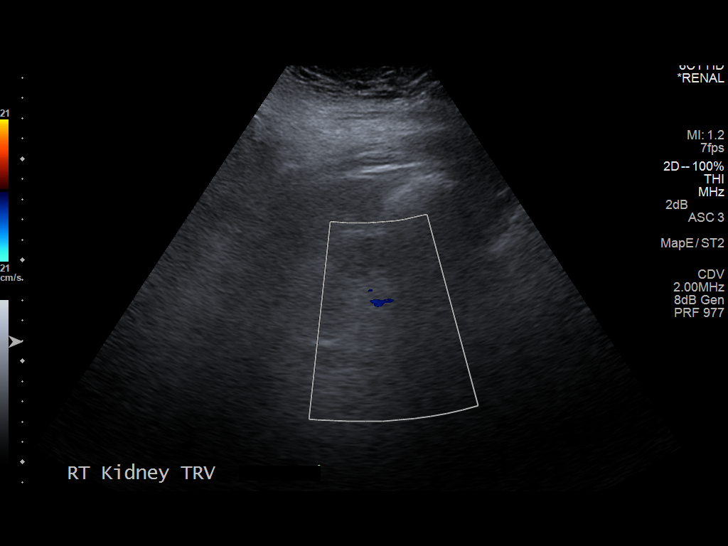
[im 12/27]
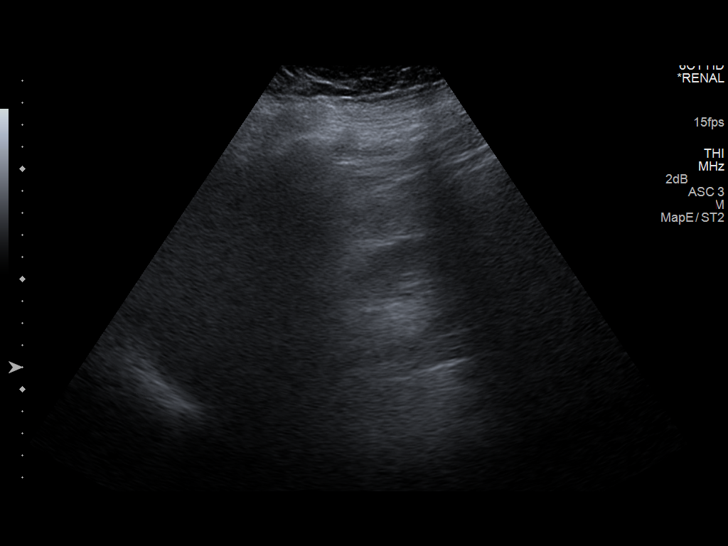
[im 15/27]
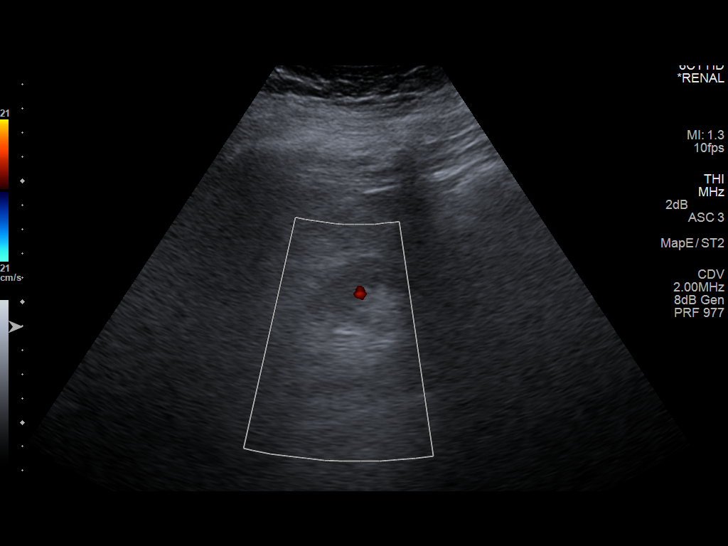
[im 17/27]
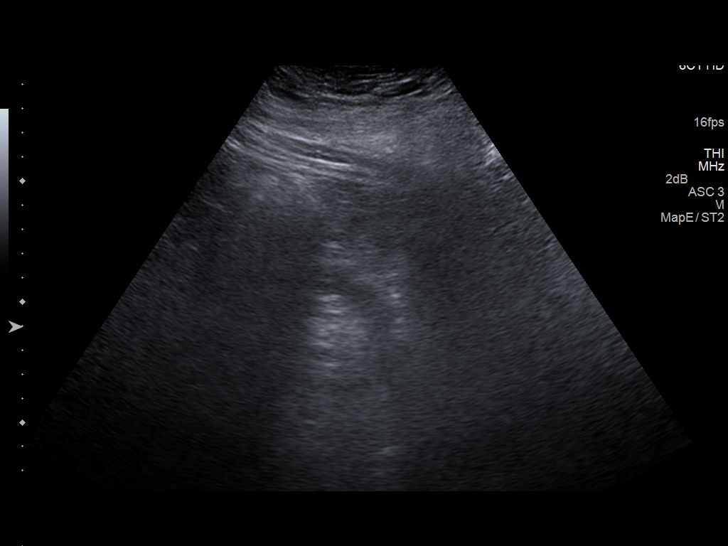
[im 18/27]
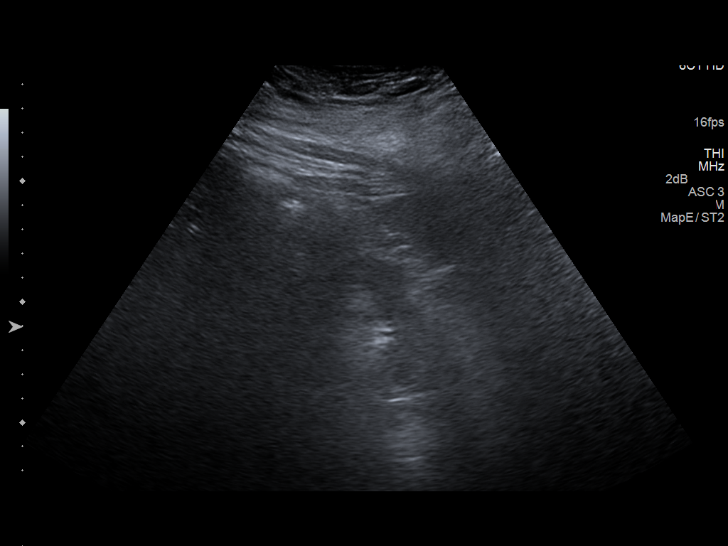
[im 20/27]
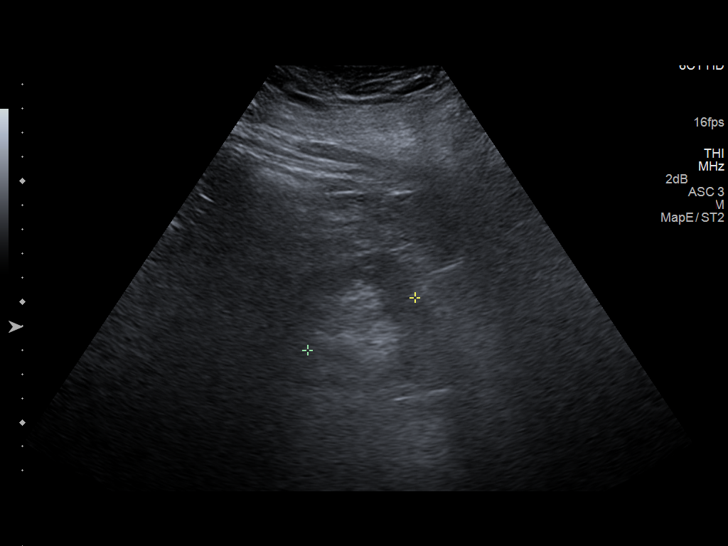
[im 22/27]
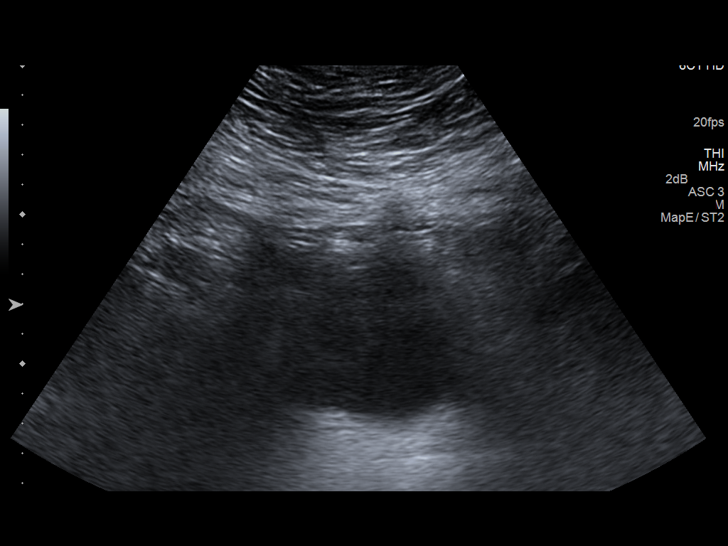
[im 24/27]
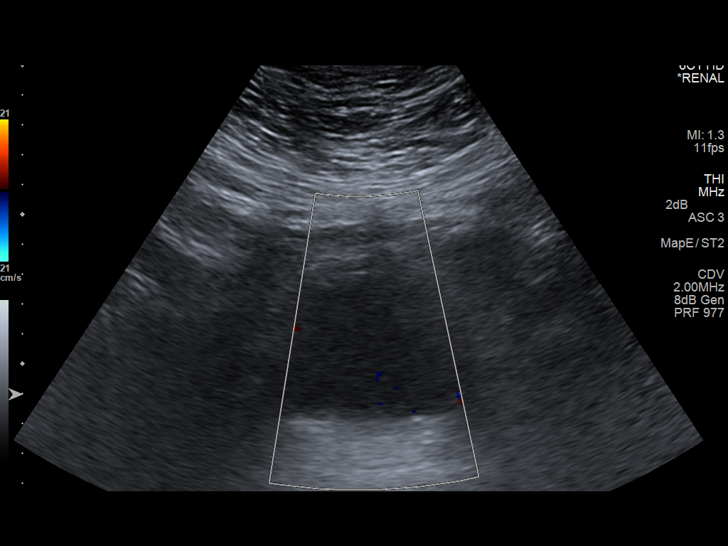
[im 27/27]
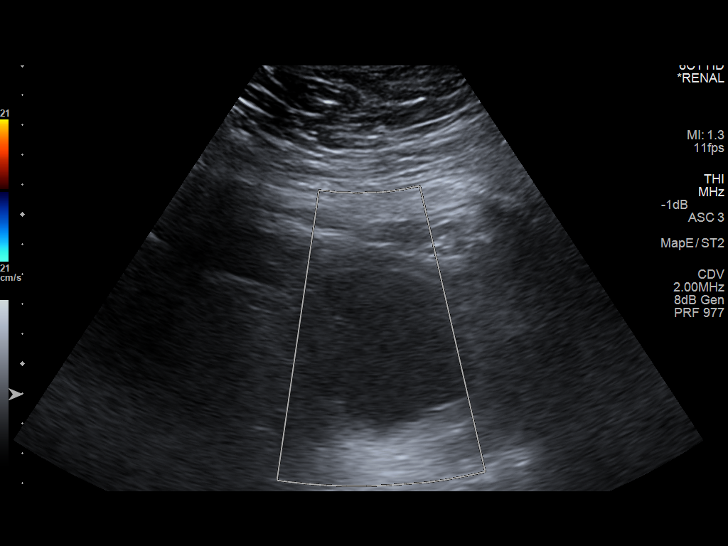

[14 of 25 positions shown; findings below may reference images not displayed]

FINDINGS: Right Kidney:

Length: 11.5 cm. Echogenicity and renal cortical thickness within
normal limits. No mass, perinephric fluid, or hydronephrosis
visualized. No sonographically demonstrable calculus or
ureterectasis.

Left Kidney:

Length: 8.6 cm. Echogenicity within normal limits. There is renal
cortical thinning. No mass, perinephric fluid, or hydronephrosis
visualized. No sonographically demonstrable calculus or
ureterectasis.

Bladder:

Appears normal for degree of bladder distention.
IMPRESSION: Small left kidney with renal cortical thinning compared to the
normal-appearing right kidney. This size discrepancy raises question
of renal artery stenosis on the left. In this regard, question
whether patient is hypertensive. Study otherwise unremarkable.

## 2016-05-03 ENCOUNTER — Telehealth: Payer: Self-pay | Admitting: Endocrinology

## 2016-05-03 NOTE — Telephone Encounter (Signed)
PT caregiver called to inform us that this PT has passed away.

## 2016-05-03 NOTE — Telephone Encounter (Signed)
See note below to be advised. 

## 2016-05-24 DEATH — deceased

## 2016-06-20 IMAGING — DX DG CHEST 2V
2 series · 2 of 2 positions shown · non-contrast
Comparison: PA and lateral chest 09/13/2015 and 05/02/2015.

CLINICAL DATA: Fatigue for the past few days.  Initial encounter.

EXAM:
CHEST  2 VIEW

[chest lat]
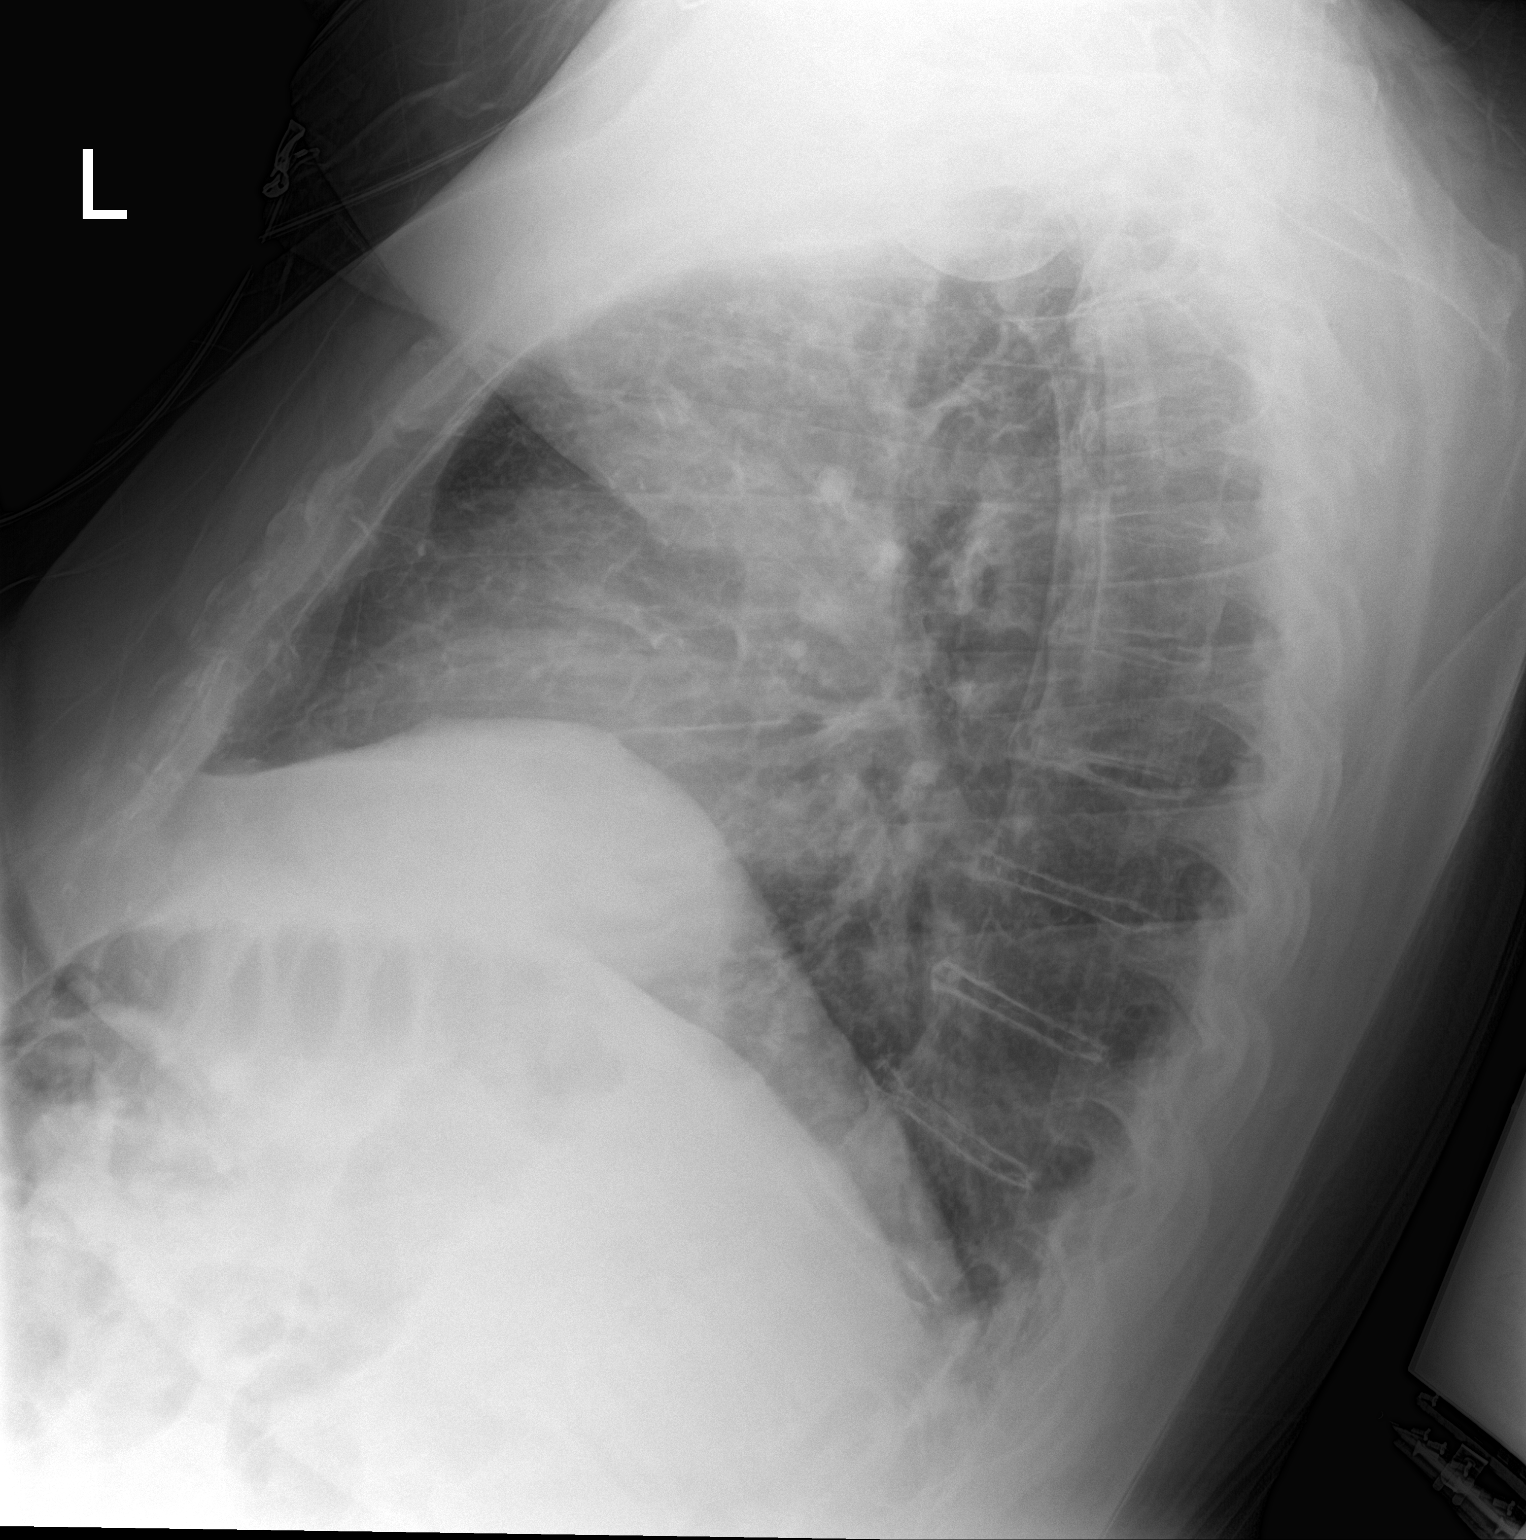

[chest ap]
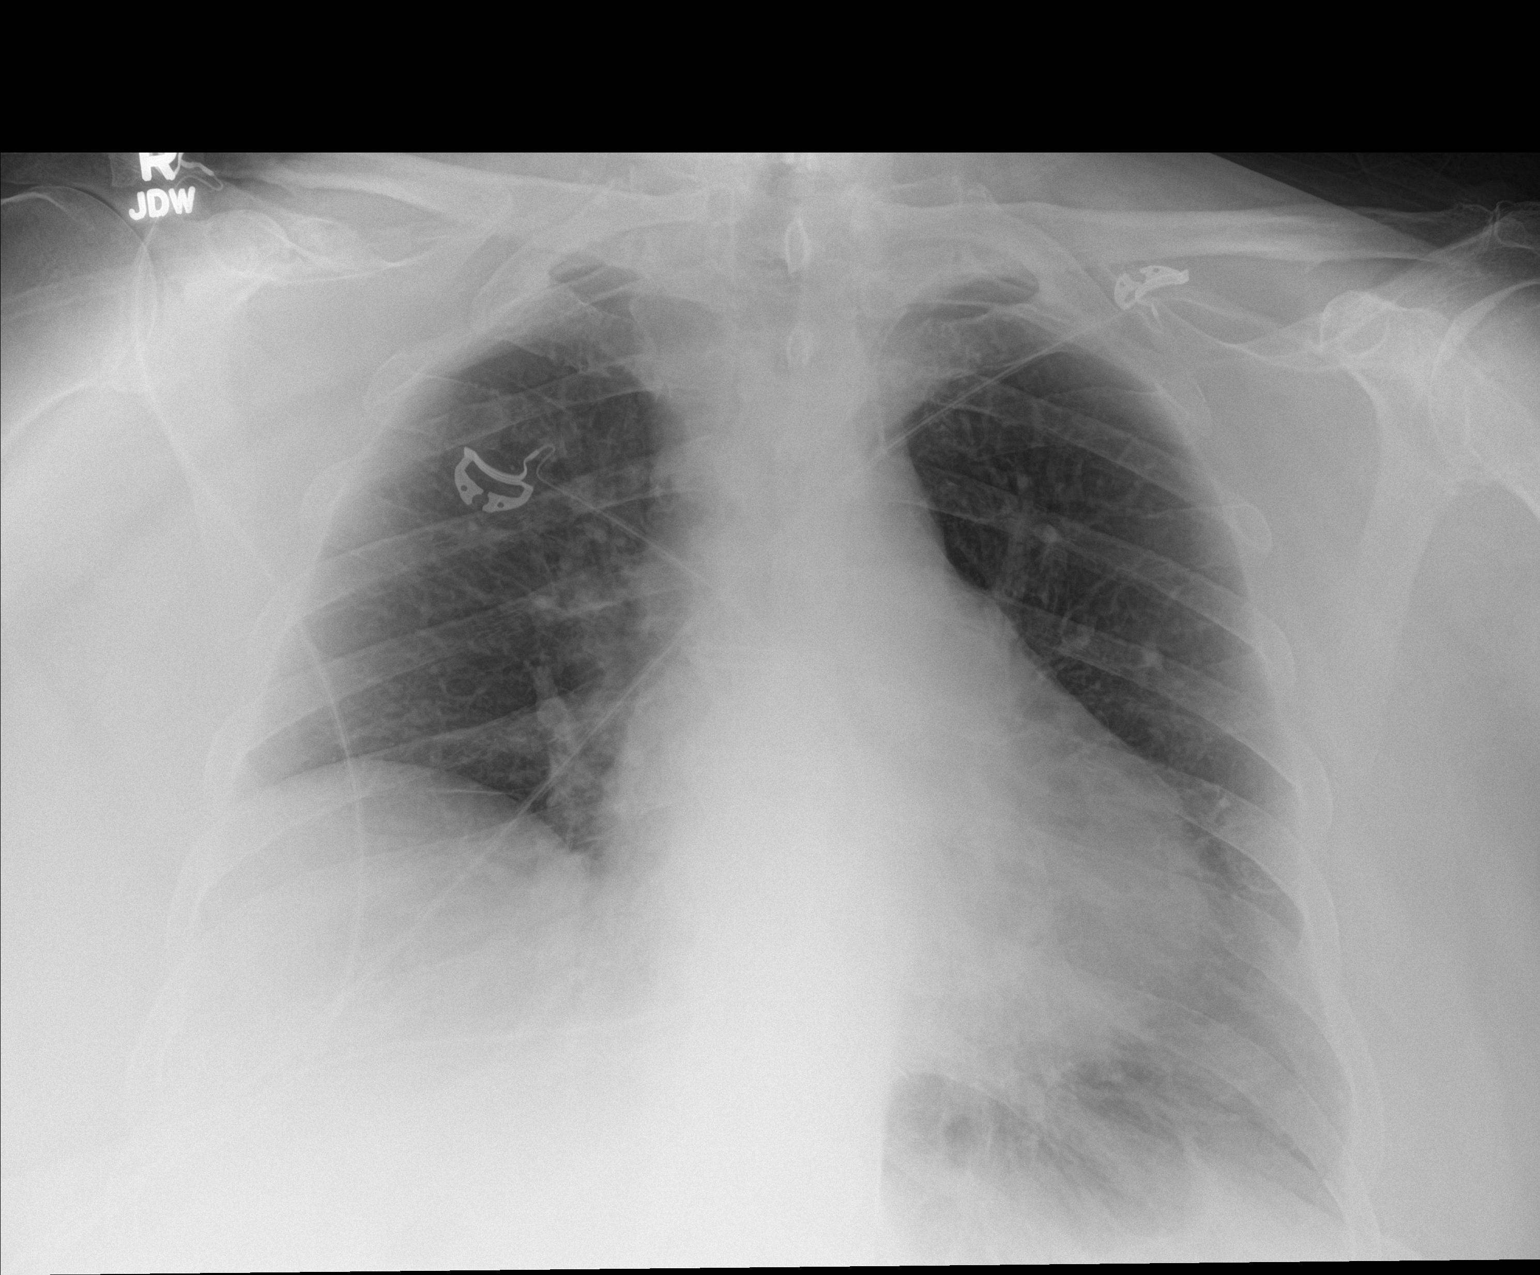

[2 of 2 positions shown; findings below may reference images not displayed]

FINDINGS: The lungs are clear. Heart size is upper normal. No pneumothorax or
pleural effusion.
IMPRESSION: No acute disease.

## 2016-07-16 IMAGING — DX DG CHEST 1V
1 series · 1 of 1 positions shown · non-contrast
Comparison: 10/18/2015

CLINICAL DATA: Hypotension this am

EXAM:
CHEST  1 VIEW

[x chest ap]
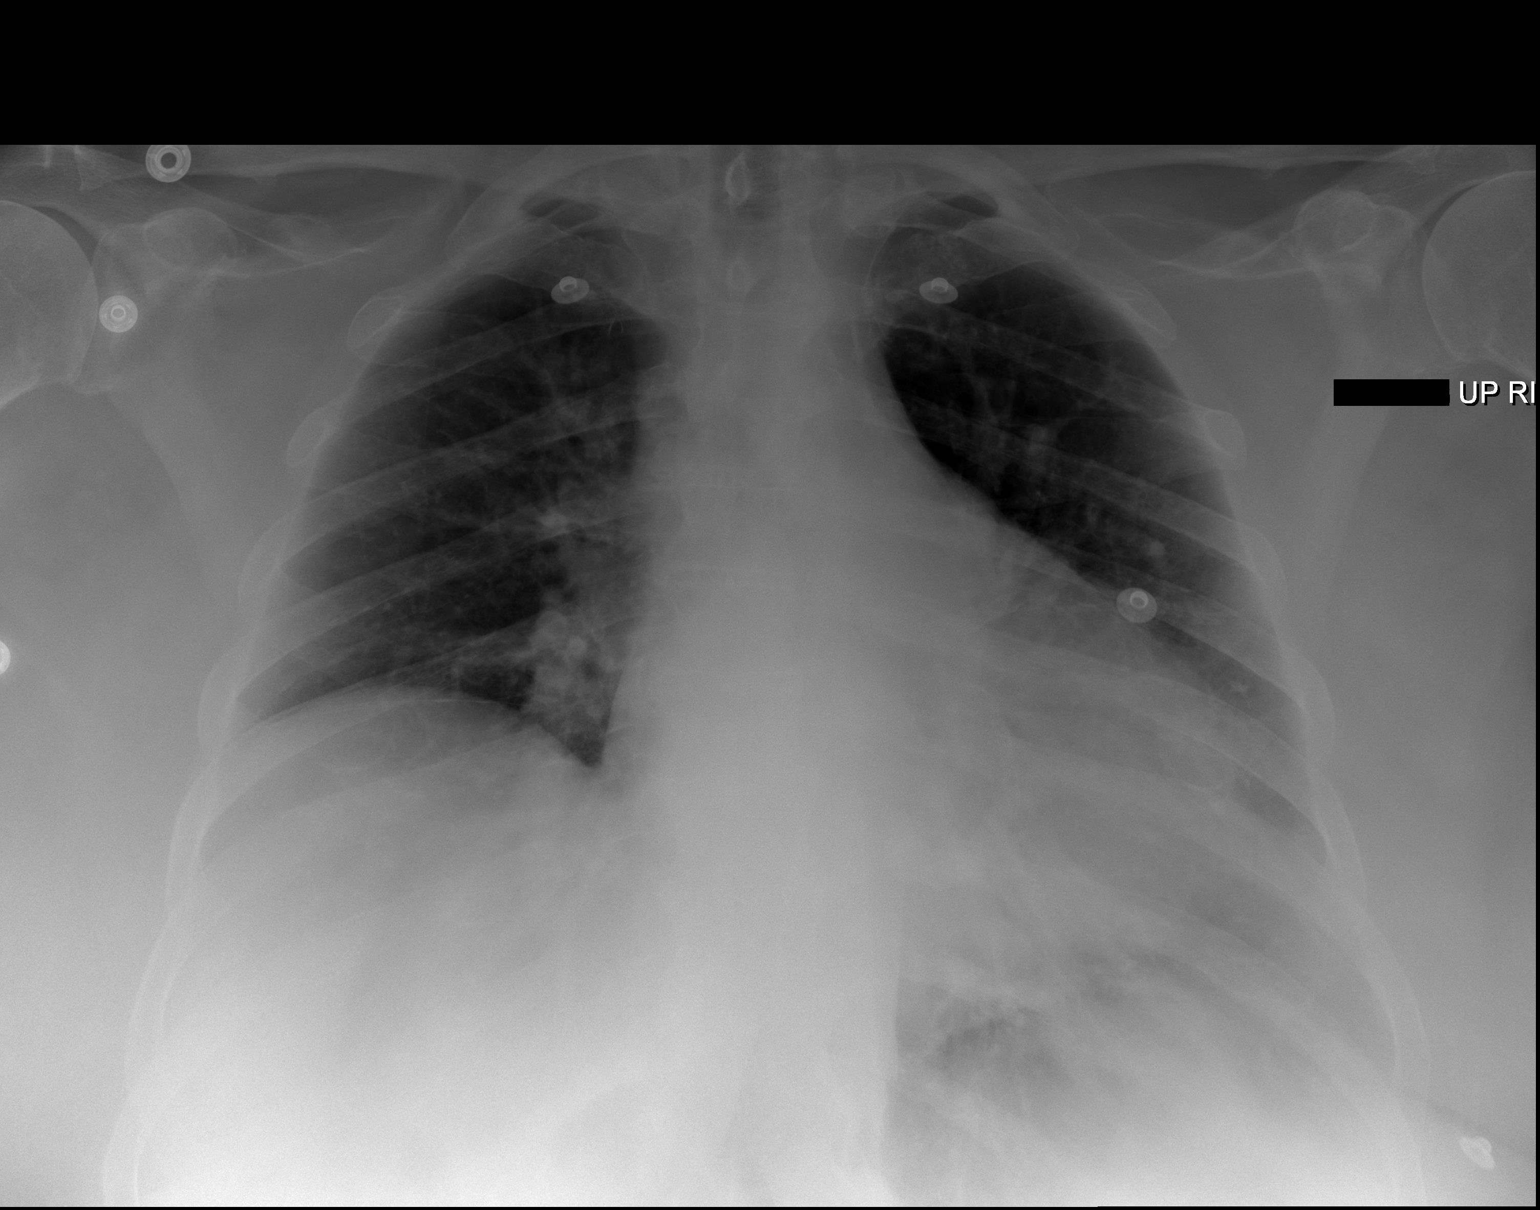

[1 of 1 positions shown; findings below may reference images not displayed]

FINDINGS: Lungs are clear. Heart size and mediastinal contours are within
normal limits. Left diaphragmatic leaflet is obscured as before.
No effusion.  No pneumothorax.
Visualized skeletal structures are unremarkable.
IMPRESSION: No acute cardiopulmonary disease.
